# Patient Record
Sex: Male | Born: 1940 | Race: White | Hispanic: No | Marital: Married | Smoking: Never smoker
Health system: Southern US, Community
[De-identification: ages and names within clinical notes are randomized; demographics above are authoritative.]

## PROBLEM LIST (undated history)

## (undated) DIAGNOSIS — C801 Malignant (primary) neoplasm, unspecified: Secondary | ICD-10-CM

## (undated) DIAGNOSIS — I5032 Chronic diastolic (congestive) heart failure: Secondary | ICD-10-CM

## (undated) DIAGNOSIS — I2510 Atherosclerotic heart disease of native coronary artery without angina pectoris: Secondary | ICD-10-CM

## (undated) DIAGNOSIS — M1990 Unspecified osteoarthritis, unspecified site: Secondary | ICD-10-CM

## (undated) DIAGNOSIS — H9193 Unspecified hearing loss, bilateral: Secondary | ICD-10-CM

## (undated) DIAGNOSIS — I10 Essential (primary) hypertension: Secondary | ICD-10-CM

## (undated) DIAGNOSIS — F32A Depression, unspecified: Secondary | ICD-10-CM

## (undated) DIAGNOSIS — E785 Hyperlipidemia, unspecified: Secondary | ICD-10-CM

## (undated) DIAGNOSIS — C61 Malignant neoplasm of prostate: Secondary | ICD-10-CM

## (undated) HISTORY — DX: Atherosclerotic heart disease of native coronary artery without angina pectoris: I25.10

## (undated) HISTORY — PX: LYMPH NODE BIOPSY: SHX201

## (undated) HISTORY — PX: CORONARY ANGIOPLASTY: SHX604

## (undated) HISTORY — DX: Hyperlipidemia, unspecified: E78.5

## (undated) HISTORY — PX: ROBOT ASSISTED LAPAROSCOPIC RADICAL PROSTATECTOMY: SHX5141

## (undated) HISTORY — PX: NASAL SEPTUM SURGERY: SHX37

## (undated) HISTORY — DX: Essential (primary) hypertension: I10

## (undated) HISTORY — DX: Unspecified osteoarthritis, unspecified site: M19.90

---

## 2000-03-08 ENCOUNTER — Other Ambulatory Visit: Admission: RE | Admit: 2000-03-08 | Discharge: 2000-03-08 | Payer: Self-pay | Admitting: Orthopedic Surgery

## 2000-03-23 ENCOUNTER — Encounter: Admission: RE | Admit: 2000-03-23 | Discharge: 2000-03-23 | Payer: Self-pay | Admitting: Infectious Diseases

## 2000-04-02 ENCOUNTER — Encounter: Admission: RE | Admit: 2000-04-02 | Discharge: 2000-04-02 | Payer: Self-pay | Admitting: Infectious Diseases

## 2000-04-16 ENCOUNTER — Encounter: Admission: RE | Admit: 2000-04-16 | Discharge: 2000-04-16 | Payer: Self-pay | Admitting: Infectious Diseases

## 2000-05-14 ENCOUNTER — Encounter: Admission: RE | Admit: 2000-05-14 | Discharge: 2000-05-14 | Payer: Self-pay | Admitting: Infectious Diseases

## 2000-05-28 ENCOUNTER — Encounter: Admission: RE | Admit: 2000-05-28 | Discharge: 2000-05-28 | Payer: Self-pay | Admitting: Infectious Diseases

## 2000-09-24 ENCOUNTER — Encounter: Admission: RE | Admit: 2000-09-24 | Discharge: 2000-09-24 | Payer: Self-pay | Admitting: Infectious Diseases

## 2009-03-20 HISTORY — PX: NM MYOVIEW LTD: HXRAD82

## 2009-06-15 ENCOUNTER — Encounter: Admission: RE | Admit: 2009-06-15 | Discharge: 2009-06-15 | Payer: Self-pay | Admitting: Cardiovascular Disease

## 2009-06-17 ENCOUNTER — Inpatient Hospital Stay (HOSPITAL_COMMUNITY): Admission: RE | Admit: 2009-06-17 | Discharge: 2009-06-18 | Payer: Self-pay | Admitting: Cardiovascular Disease

## 2009-06-17 HISTORY — PX: CARDIAC CATHETERIZATION: SHX172

## 2009-09-27 ENCOUNTER — Inpatient Hospital Stay (HOSPITAL_COMMUNITY): Admission: RE | Admit: 2009-09-27 | Discharge: 2009-09-28 | Payer: Self-pay | Admitting: Urology

## 2009-09-27 ENCOUNTER — Encounter (INDEPENDENT_AMBULATORY_CARE_PROVIDER_SITE_OTHER): Payer: Self-pay | Admitting: Urology

## 2010-06-05 LAB — BASIC METABOLIC PANEL
CO2: 27 mEq/L (ref 19–32)
Calcium: 9.5 mg/dL (ref 8.4–10.5)
Creatinine, Ser: 0.89 mg/dL (ref 0.4–1.5)
GFR calc Af Amer: >60 mL/min (ref >60)
GFR calc non Af Amer: >60 mL/min (ref >60)
Glucose, Bld: 84 mg/dL (ref 70–99)
Potassium: 3.8 mEq/L (ref 3.5–5.1)

## 2010-06-05 LAB — CBC
HCT: 44.8 % (ref 39.0–52.0)
MCHC: 34.8 g/dL (ref 30.0–36.0)
RBC: 5.37 MIL/uL (ref 4.22–5.81)
RDW: 14.1 % (ref 11.5–15.5)

## 2010-06-05 LAB — TYPE AND SCREEN: ABO/RH(D): AB POS

## 2010-06-05 LAB — HEMOGLOBIN AND HEMATOCRIT, BLOOD
HCT: 41 % (ref 39.0–52.0)
Hemoglobin: 12.8 g/dL — ABNORMAL LOW (ref 13.0–17.0)
Hemoglobin: 14.3 g/dL (ref 13.0–17.0)

## 2010-06-08 LAB — BASIC METABOLIC PANEL
Calcium: 8.8 mg/dL (ref 8.4–10.5)
Chloride: 105 mEq/L (ref 96–112)
Creatinine, Ser: 0.86 mg/dL (ref 0.4–1.5)
GFR calc Af Amer: >60 mL/min (ref >60)
Glucose, Bld: 100 mg/dL — ABNORMAL HIGH (ref 70–99)
Potassium: 3.7 mEq/L (ref 3.5–5.1)
Sodium: 137 mEq/L (ref 135–145)

## 2010-06-08 LAB — CBC
MCHC: 33.9 g/dL (ref 30.0–36.0)
MCV: 84.3 fL (ref 78.0–100.0)
RBC: 4.82 MIL/uL (ref 4.22–5.81)
RDW: 13.2 % (ref 11.5–15.5)

## 2012-08-01 ENCOUNTER — Other Ambulatory Visit: Payer: Self-pay | Admitting: Cardiovascular Disease

## 2012-08-11 ENCOUNTER — Other Ambulatory Visit: Payer: Self-pay | Admitting: Cardiovascular Disease

## 2012-08-15 NOTE — Telephone Encounter (Signed)
Refills sent to pharmacy. 

## 2012-09-07 ENCOUNTER — Other Ambulatory Visit: Payer: Self-pay | Admitting: Cardiovascular Disease

## 2012-09-10 NOTE — Telephone Encounter (Signed)
Rx was sent to pharmacy electronically. 

## 2012-09-16 ENCOUNTER — Other Ambulatory Visit: Payer: Self-pay | Admitting: Cardiovascular Disease

## 2012-09-16 NOTE — Telephone Encounter (Signed)
Rx was sent to pharmacy electronically. 

## 2012-11-06 ENCOUNTER — Ambulatory Visit (INDEPENDENT_AMBULATORY_CARE_PROVIDER_SITE_OTHER): Payer: Medicare Other | Admitting: Cardiovascular Disease

## 2012-11-06 ENCOUNTER — Encounter: Payer: Self-pay | Admitting: Cardiovascular Disease

## 2012-11-06 VITALS — BP 140/90 | HR 48 | Ht 71.0 in | Wt 201.9 lb

## 2012-11-06 DIAGNOSIS — I2510 Atherosclerotic heart disease of native coronary artery without angina pectoris: Secondary | ICD-10-CM | POA: Insufficient documentation

## 2012-11-06 DIAGNOSIS — I10 Essential (primary) hypertension: Secondary | ICD-10-CM

## 2012-11-06 DIAGNOSIS — E785 Hyperlipidemia, unspecified: Secondary | ICD-10-CM

## 2012-11-06 NOTE — Assessment & Plan Note (Signed)
On statin therapy followed by his PCP 

## 2012-11-06 NOTE — Patient Instructions (Addendum)
Your physician recommends that you schedule a follow-up appointment in: 6 months with bryan  Your physician recommends that you schedule a follow-up appointment in: 12 months with Dr Allyson Sabal

## 2012-11-06 NOTE — Assessment & Plan Note (Signed)
Status post LAD stenting using bare-metal stent 04/16/96 by myself. He had normal LV function and normal renal arteries. I recatheterized him 06/17/09 after a negative Myoview stress test because of reproducible symptoms revealing a 90% stenosis in the LAD just beyond the previously stented segment which I can stented with a MultiLink vision bare metal stent. He's done well since and is asymptomatic.

## 2012-11-06 NOTE — Assessment & Plan Note (Signed)
Under adequate control on multiple antihypertensive medications with documentation of normal renal arteries

## 2012-11-06 NOTE — Progress Notes (Signed)
11/06/2012 Jeffrey Acevedo   01/14/1941  [id number]  Primary Physician Feliciana Rossetti, MD Primary Cardiologist: Runell Gess MD [name]   HPI:  The patient is a 72 year old mildly overweight married Caucasian male, father of 2 and grandfather of 4 grandchildren, whom I last saw 3 months ago. He has a history of CAD, status post LAD stenting by me using bare-metal stent April 16, 1996. He had normal LV function at that time with normal renal arteries as well. His other problems include essential hypertension, hyperlipidemia. He had a normal Myoview February 2011 and was scheduled to have elective robotic prostatectomy for prostate cancer by Dr. Crecencio Mc. He developed exertional chest pain similar to his pre-stent symptoms, which were fairly reproducible and because of this, and despite a negative Myoview, I elected to recatheterize him June 17, 2009, revealing a patent proximal LAD stent with 90% stenosis just beyond this, which I then stented with a Multi-Link Vision bare-metal stent with an excellent result. His most recent lipid profile is excellent. He saw [facility] back in the office 05/08/12 and was doing well. Since that time he denies chest pain or shortness of breath. He has had some "venous stasis changes in his right ankle and pretibial area.   Current Outpatient Prescriptions  Medication Sig Dispense Refill  . amLODipine (NORVASC) 10 MG tablet Take 0.5 tablets by mouth daily.       . Choline Fenofibrate (FENOFIBRIC ACID) 45 MG CPDR Take 1 tablet by mouth every other day. Alternates with Lovaza      . clobetasol ointment (TEMOVATE) 0.05 % Apply 1 application topically daily.      . clopidogrel (PLAVIX) 75 MG tablet Take 1 tablet (75 mg total) by mouth daily.  30 tablet  9  . lisinopril (PRINIVIL,ZESTRIL) 40 MG tablet TAKE ONE TABLET BY MOUTH EVERY DAY  30 tablet  6  . metoprolol (LOPRESSOR) 50 MG tablet Take 50 mg by mouth daily.      [name] omega-3 acid ethyl  esters (LOVAZA) 1 G capsule Take 1 g by mouth every other day. Alternates with Fenofibrate      . pravastatin (PRAVACHOL) 40 MG tablet Take 40 mg by mouth daily.      [name] spironolactone (ALDACTONE) 25 MG tablet TAKE ONE TABLET BY MOUTH EVERY DAY  30 tablet  8  . traMADol (ULTRAM) 50 MG tablet Take 50 mg by mouth as needed for pain.      . valsartan-hydrochlorothiazide (DIOVAN-HCT) 160-25 MG per tablet TAKE ONE TABLET BY MOUTH EVERY DAY  30 tablet  8   No current facility-administered medications for this visit.    Allergies  Allergen Reactions  . Vantin [Cefpodoxime] Rash    History   Social History  . Marital Status: Married    Spouse Name: N/A    Number of Children: N/A  . Years of Education: N/A   Occupational History  . Not on file.   Social History Main Topics  . Smoking status: Never Smoker   . Smokeless tobacco: Never Used  . Alcohol Use: No  . Drug Use: Not on file  . Sexual Activity: Not on file   Other Topics Concern  . Not on file   Social History Narrative  . No narrative on file     Review of Systems: General: negative for chills, fever, night sweats or weight changes.  Cardiovascular: negative for chest pain, dyspnea on exertion, edema, orthopnea, palpitations, paroxysmal nocturnal dyspnea or shortness of breath  Dermatological: negative for rash Respiratory: negative for cough or wheezing Urologic: negative for hematuria Abdominal: negative for nausea, vomiting, diarrhea, bright red blood per rectum, melena, or hematemesis Neurologic: negative for visual changes, syncope, or dizziness All other systems reviewed and are otherwise negative except as noted above.    Blood pressure 140/90, pulse 48, height 5\' 11"  (1.803 m), weight 201 lb 14.4 oz (91.581 kg).  General appearance: alert and no distress Neck: no adenopathy, no carotid bruit, no JVD, supple, symmetrical, trachea midline and thyroid not enlarged, symmetric, no tenderness/mass/nodules Lungs:  clear to auscultation bilaterally Heart: regular rate and rhythm, S1, S2 normal, no murmur, click, rub or gallop Extremities: extremities normal, atraumatic, no cyanosis or edema  EKG sinus bradycardia at 48 first-degree AV block  ASSESSMENT AND PLAN:   Coronary artery disease Status post LAD stenting using bare-metal stent 04/16/96 by myself. He had normal LV function and normal renal arteries. I recatheterized him 06/17/09 after a negative Myoview stress test because of reproducible symptoms revealing a 90% stenosis in the LAD just beyond the previously stented segment which I can stented with a MultiLink vision bare metal stent. He's done well since and is asymptomatic.  Essential hypertension Under adequate control on multiple antihypertensive medications with documentation of normal renal arteries  Hyperlipidemia On statin therapy followed by his PCP      Runell Gess MD [facility], [facility] 11/06/2012 10:08 AM

## 2012-11-09 ENCOUNTER — Other Ambulatory Visit: Payer: Self-pay | Admitting: Cardiovascular Disease

## 2012-11-18 ENCOUNTER — Other Ambulatory Visit: Payer: Self-pay | Admitting: Cardiovascular Disease

## 2012-11-19 NOTE — Telephone Encounter (Signed)
Rx was sent to pharmacy electronically. 

## 2012-11-21 ENCOUNTER — Other Ambulatory Visit: Payer: Self-pay | Admitting: *Deleted

## 2012-11-21 MED ORDER — PRAVASTATIN SODIUM 40 MG PO TABS: 40.0000 mg | ORAL_TABLET | Freq: Every day | ORAL | Status: DC

## 2012-11-21 NOTE — Telephone Encounter (Signed)
Rx was sent to pharmacy electronically. 

## 2013-04-04 ENCOUNTER — Other Ambulatory Visit: Payer: Self-pay | Admitting: Cardiovascular Disease

## 2013-04-07 ENCOUNTER — Other Ambulatory Visit: Payer: Self-pay | Admitting: *Deleted

## 2013-04-07 MED ORDER — LISINOPRIL 40 MG PO TABS: 40.0000 mg | ORAL_TABLET | Freq: Every day | ORAL | Status: DC

## 2013-05-08 ENCOUNTER — Encounter: Payer: Self-pay | Admitting: Physician Assistant

## 2013-05-08 ENCOUNTER — Ambulatory Visit (INDEPENDENT_AMBULATORY_CARE_PROVIDER_SITE_OTHER): Payer: Medicare Other | Admitting: Physician Assistant

## 2013-05-08 VITALS — BP 124/72 | HR 55 | Ht 72.0 in | Wt 202.0 lb

## 2013-05-08 DIAGNOSIS — I10 Essential (primary) hypertension: Secondary | ICD-10-CM

## 2013-05-08 DIAGNOSIS — E785 Hyperlipidemia, unspecified: Secondary | ICD-10-CM

## 2013-05-08 DIAGNOSIS — I2510 Atherosclerotic heart disease of native coronary artery without angina pectoris: Secondary | ICD-10-CM

## 2013-05-08 NOTE — Assessment & Plan Note (Signed)
Pressure is well-controlled at this time.

## 2013-05-08 NOTE — Patient Instructions (Signed)
1.  Follow up with Dr Gwenlyn Found in 6 months. 2.  Have a great weekend!

## 2013-05-08 NOTE — Assessment & Plan Note (Signed)
Most recent lipid panel, 03/05/2013: Total cholesterol 131, triglycerides 159, HDL 33 LDL 66 VLDL 32 9 HDL 98.  On pravastatin 40

## 2013-05-08 NOTE — Assessment & Plan Note (Signed)
No symptoms of angina. Good medical therapy.

## 2013-05-08 NOTE — Progress Notes (Signed)
Date:  05/08/2013   ID:  Jeffrey Acevedo, DOB [birth year], MRN [id number]  PCP:  Feliciana Rossetti, MD  Primary Cardiologist:   Allyson Sabal   History of Present Illness: Jeffrey Acevedo is a 73 y.o. mildly overweight married Caucasian male, father of 2 and grandfather of 4 grandchildren, whom I last saw 3 months ago. He has a history of CAD, status post LAD stenting by me using bare-metal stent April 16, 1996. He had normal LV function at that time with normal renal arteries as well. His other problems include essential hypertension, hyperlipidemia. He had a normal Myoview February 2011 and was scheduled to have elective robotic prostatectomy for prostate cancer by Dr. Crecencio Mc. He developed exertional chest pain similar to his pre-stent symptoms, which were fairly reproducible and because of this, and despite a negative Myoview, I elected to recatheterize him June 17, 2009, revealing a patent proximal LAD stent with 90% stenosis just beyond this, which I then stented with a Multi-Link Vision bare-metal stent with an excellent result. His most recent lipid profile is excellent. He saw Huey Bienenstock [facility] back in the office 05/08/12 and was doing well. Since that time he denies chest pain or shortness of breath. He has had some "venous stasis changes in his right ankle and pretibial area.  Patient presents today for 6 month evaluation. He's been doing for a while with the exception of having a viral illness which she is recovered from and continued issues with his right hip. 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 However he does have to rest occasionally due to hip pain. At some point he may have a hip replacement.  Most recent lipid panel, 03/05/2013: Total cholesterol 131, triglycerides 159, HDL 33 LDL 66 VLDL 32 9 HDL 98  The patient currently denies nausea, vomiting, fever, chest pain, shortness of breath, orthopnea, dizziness, PND, cough, congestion, abdominal  pain, hematochezia, melena, lower extremity edema, claudication.  Wt Readings from Last 3 Encounters:  05/08/13 202 lb (91.627 kg)  11/06/12 201 lb 14.4 oz (91.581 kg)     Past Medical History  Diagnosis Date  . Hypertension   . Coronary artery disease   . Hyperlipidemia   . Degenerative joint disease     Right hip    Current Outpatient Prescriptions  Medication Sig Dispense Refill  . amLODipine (NORVASC) 10 MG tablet TAKE ONE-HALF TABLET BY MOUTH EVERY DAY  15 tablet  11  . Choline Fenofibrate (FENOFIBRIC ACID) 45 MG CPDR TAKE ONE CAPSULE BY MOUTH EVERY DAY  30 capsule  6  . clobetasol ointment (TEMOVATE) 0.05 % Apply 1 application topically daily.      . clopidogrel (PLAVIX) 75 MG tablet Take 1 tablet (75 mg total) by mouth daily.  30 tablet  9  . lisinopril (PRINIVIL,ZESTRIL) 40 MG tablet Take 1 tablet (40 mg total) by mouth daily.  30 tablet  6  . metoprolol tartrate (LOPRESSOR) 25 MG tablet TAKE ONE TABLET BY MOUTH EVERY DAY  90 tablet  3  . omega-3 acid ethyl esters (LOVAZA) 1 G capsule Take 1 g by mouth every other day. Alternates with Fenofibrate      . pravastatin (PRAVACHOL) 40 MG tablet Take 1 tablet (40 mg total) by mouth daily.  90 tablet  3  . spironolactone (ALDACTONE) 25 MG tablet TAKE ONE TABLET BY MOUTH EVERY DAY  30 tablet  8  . valsartan-hydrochlorothiazide (DIOVAN-HCT) 160-25 MG per tablet  TAKE ONE TABLET BY MOUTH EVERY DAY  30 tablet  8  . traMADol (ULTRAM) 50 MG tablet Take 50 mg by mouth as needed for pain.       No current facility-administered medications for this visit.    Allergies:    Allergies  Allergen Reactions  . Vantin [Cefpodoxime] Rash    Social History:  The patient  reports that he has never smoked. He has never used smokeless tobacco. He reports that he does not drink alcohol.   Family history:  No family history on file.  ROS:  Please see the history of present illness.  All other systems reviewed and negative.   PHYSICAL EXAM: VS:   BP 124/72  Pulse 55  Ht 6' (1.829 m)  Wt 202 lb (91.627 kg)  BMI 27.39 kg/m2 Well nourished, well developed, in no acute distress HEENT: Pupils are equal round react to light accommodation extraocular movements are intact.  Neck: no JVDNo cervical lymphadenopathy. Cardiac: Regular rate and rhythm without murmurs rubs or gallops. Lungs:  clear to auscultation bilaterally, no wheezing, rhonchi or rales Abd: soft, nontender, positive bowel sounds all quadrants, no hepatosplenomegaly Ext: no lower extremity edema.  2+ radial and dorsalis pedis pulses. Skin: warm and dry Neuro:  Grossly normal  EKG:  Sinus bradycardia with first-degree AV block at a rate of 55 beats per minute   ASSESSMENT AND PLAN:  Problem List Items Addressed This Visit   Essential hypertension - Primary     Pressure is well-controlled at this time.    Hyperlipidemia     Most recent lipid panel, 03/05/2013: Total cholesterol 131, triglycerides 159, HDL 33 LDL 66 VLDL 32 9 HDL 98.  On pravastatin 40    Coronary artery disease     No symptoms of angina. Good medical therapy.

## 2013-05-16 ENCOUNTER — Encounter: Payer: Self-pay | Admitting: Cardiovascular Disease

## 2013-07-15 ENCOUNTER — Other Ambulatory Visit: Payer: Self-pay | Admitting: *Deleted

## 2013-07-15 MED ORDER — SPIRONOLACTONE 25 MG PO TABS: 25.0000 mg | ORAL_TABLET | Freq: Every day | ORAL | Status: DC

## 2013-07-22 ENCOUNTER — Other Ambulatory Visit: Payer: Self-pay

## 2013-07-22 MED ORDER — CLOPIDOGREL BISULFATE 75 MG PO TABS: 75.0000 mg | ORAL_TABLET | Freq: Every day | ORAL | Status: DC

## 2013-07-22 MED ORDER — VALSARTAN-HYDROCHLOROTHIAZIDE 160-25 MG PO TABS: 1.0000 | ORAL_TABLET | Freq: Every day | ORAL | Status: DC

## 2013-07-22 NOTE — Telephone Encounter (Signed)
Rx was sent to pharmacy electronically. 

## 2013-11-17 ENCOUNTER — Other Ambulatory Visit: Payer: Self-pay | Admitting: Cardiovascular Disease

## 2013-11-17 NOTE — Telephone Encounter (Signed)
Rx was sent to pharmacy electronically. 

## 2013-11-24 ENCOUNTER — Other Ambulatory Visit: Payer: Self-pay | Admitting: Cardiovascular Disease

## 2013-11-25 NOTE — Telephone Encounter (Signed)
Rx was sent to pharmacy electronically. 

## 2013-11-27 ENCOUNTER — Other Ambulatory Visit: Payer: Self-pay | Admitting: Cardiovascular Disease

## 2013-11-28 NOTE — Telephone Encounter (Signed)
Rx refill sent to patient pharmacy   

## 2013-12-04 ENCOUNTER — Encounter: Payer: Self-pay | Admitting: Cardiovascular Disease

## 2013-12-04 ENCOUNTER — Ambulatory Visit (INDEPENDENT_AMBULATORY_CARE_PROVIDER_SITE_OTHER): Payer: Medicare Other | Admitting: Cardiovascular Disease

## 2013-12-04 VITALS — BP 166/98 | HR 62 | Ht 72.0 in | Wt 204.0 lb

## 2013-12-04 DIAGNOSIS — I2510 Atherosclerotic heart disease of native coronary artery without angina pectoris: Secondary | ICD-10-CM

## 2013-12-04 DIAGNOSIS — E785 Hyperlipidemia, unspecified: Secondary | ICD-10-CM

## 2013-12-04 DIAGNOSIS — I10 Essential (primary) hypertension: Secondary | ICD-10-CM

## 2013-12-04 NOTE — Progress Notes (Signed)
12/04/2013 Jeffrey Acevedo   January 23, 1941  [id number]  Primary Physician Jeffrey Rossetti, MD Primary Cardiologist: Jeffrey Gess MD Jeffrey Acevedo   HPI:  The patient is a 73 year old mildly overweight married Caucasian male, father of 2 and grandfather of 4 grandchildren, whom I last saw 12 months ago. He has a history of CAD, status post LAD stenting by me using bare-metal stent April 16, 1996. He had normal LV function at that time with normal renal arteries as well. His other problems include essential hypertension, hyperlipidemia. He had a normal Myoview February 2011 and was scheduled to have elective robotic prostatectomy for prostate cancer by Dr. Crecencio Acevedo. He developed exertional chest pain similar to his pre-stent symptoms, which were fairly reproducible and because of this, and despite a negative Myoview, I elected to recatheterize him June 17, 2009, revealing a patent proximal LAD stent with 90% stenosis just beyond this, which I then stented with a Multi-Link Vision bare-metal stent with an excellent result. Since I saw him a year ago he's been completely asymptomatic.  Current Outpatient Prescriptions  Medication Sig Dispense Refill  . amLODipine (NORVASC) 10 MG tablet TAKE ONE-HALF TABLET BY MOUTH ONCE DAILY  15 tablet  6  . Choline Fenofibrate (FENOFIBRIC ACID) 45 MG CPDR TAKE ONE CAPSULE BY MOUTH EVERY DAY  30 capsule  6  . clobetasol ointment (TEMOVATE) 0.05 % Apply 1 application topically daily.      . clopidogrel (PLAVIX) 75 MG tablet Take 1 tablet (75 mg total) by mouth daily.  30 tablet  9  . lisinopril (PRINIVIL,ZESTRIL) 40 MG tablet TAKE ONE TABLET BY MOUTH ONCE DAILY  30 tablet  5  . metoprolol tartrate (LOPRESSOR) 25 MG tablet TAKE ONE TABLET BY MOUTH ONCE DAILY  90 tablet  1  . omega-3 acid ethyl esters (LOVAZA) 1 G capsule Take 1 g by mouth every other day. Alternates with Fenofibrate      . pravastatin (PRAVACHOL) 40 MG tablet TAKE ONE TABLET BY MOUTH  ONCE DAILY  90 tablet  1  . spironolactone (ALDACTONE) 25 MG tablet Take 1 tablet (25 mg total) by mouth daily.  30 tablet  8  . traMADol (ULTRAM) 50 MG tablet Take 50 mg by mouth as needed for pain.      . valsartan-hydrochlorothiazide (DIOVAN-HCT) 160-25 MG per tablet Take 1 tablet by mouth daily.  30 tablet  9   No current facility-administered medications for this visit.    Allergies  Allergen Reactions  . Vantin [Cefpodoxime] Rash    History   Social History  . Marital Status: Married    Spouse Name: N/A    Number of Children: N/A  . Years of Education: N/A   Occupational History  . Not on file.   Social History Main Topics  . Smoking status: Never Smoker   . Smokeless tobacco: Never Used  . Alcohol Use: No  . Drug Use: Not on file  . Sexual Activity: Not on file   Other Topics Concern  . Not on file   Social History Narrative  . No narrative on file     Review of Systems: General: negative for chills, fever, night sweats or weight changes.  Cardiovascular: negative for chest pain, dyspnea on exertion, edema, orthopnea, palpitations, paroxysmal nocturnal dyspnea or shortness of breath Dermatological: negative for rash Respiratory: negative for cough or wheezing Urologic: negative for hematuria Abdominal: negative for nausea, vomiting, diarrhea, bright red blood per rectum, melena, or hematemesis Neurologic: negative  for visual changes, syncope, or dizziness All other systems reviewed and are otherwise negative except as noted above.    Blood pressure 166/98, pulse 62, height 6' (1.829 m), weight 204 lb (92.534 kg).  General appearance: alert and no distress Neck: no adenopathy, no carotid bruit, no JVD, supple, symmetrical, trachea midline and thyroid not enlarged, symmetric, no tenderness/mass/nodules Lungs: clear to auscultation bilaterally Heart: regular rate and rhythm, S1, S2 normal, no murmur, click, rub or gallop Extremities: extremities normal,  atraumatic, no cyanosis or edema  EKG normal sinus rhythm at 62 with no ST or T wave changes  ASSESSMENT AND PLAN:   Essential hypertension His blood pressure is elevated today but on review of his home blood pressure checks it appears to be well controlled on current medications  Hyperlipidemia On statin therapy followed by his PCP  Coronary artery disease History of CAD status post LAD stenting by myself using a femoral stent 04/16/96. Because of her current symptoms I recatheterized him 06/17/09 revealing a patent proximal LAD stent with 90% stenosis just beyond this which I stented using a MultiLink vision bare metal stent with an excellent result. He denies breath.      Jeffrey Gess MD FACP,FACC,FAHA, [facility] 12/04/2013 4:49 PM

## 2013-12-04 NOTE — Assessment & Plan Note (Signed)
His blood pressure is elevated today but on review of his home blood pressure checks it appears to be well controlled on current medications

## 2013-12-04 NOTE — Assessment & Plan Note (Signed)
On statin therapy followed by his PCP 

## 2013-12-04 NOTE — Assessment & Plan Note (Signed)
History of CAD status post LAD stenting by myself using a femoral stent 04/16/96. Because of her current symptoms I recatheterized him 06/17/09 revealing a patent proximal LAD stent with 90% stenosis just beyond this which I stented using a MultiLink vision bare metal stent with an excellent result. He denies breath.

## 2013-12-04 NOTE — Patient Instructions (Signed)
Your physician wants you to follow-up in: 1 year with Dr Berry. You will receive a reminder letter in the mail two months in advance. If you don't receive a letter, please call our office to schedule the follow-up appointment.  

## 2014-04-03 ENCOUNTER — Other Ambulatory Visit: Payer: Self-pay

## 2014-04-03 MED ORDER — LISINOPRIL 40 MG PO TABS: 40.0000 mg | ORAL_TABLET | Freq: Every day | ORAL | Status: DC

## 2014-04-03 MED ORDER — VALSARTAN-HYDROCHLOROTHIAZIDE 160-25 MG PO TABS
1.0000 | ORAL_TABLET | Freq: Every day | ORAL | Status: DC
Start: 2014-04-03 — End: 2015-04-09

## 2014-04-03 NOTE — Telephone Encounter (Signed)
Rx sent to pharmacy   

## 2014-05-05 ENCOUNTER — Other Ambulatory Visit: Payer: Self-pay | Admitting: Cardiovascular Disease

## 2014-05-05 NOTE — Telephone Encounter (Signed)
Rx(s) sent to pharmacy electronically.  

## 2014-06-10 ENCOUNTER — Other Ambulatory Visit: Payer: Self-pay | Admitting: Cardiovascular Disease

## 2014-06-10 NOTE — Telephone Encounter (Signed)
E -sent prescription sent

## 2014-06-17 ENCOUNTER — Encounter: Payer: Self-pay | Admitting: *Deleted

## 2014-06-24 ENCOUNTER — Other Ambulatory Visit: Payer: Self-pay | Admitting: Cardiovascular Disease

## 2014-06-24 NOTE — Telephone Encounter (Signed)
Rx(s) sent to pharmacy electronically.  

## 2014-06-30 ENCOUNTER — Other Ambulatory Visit: Payer: Self-pay | Admitting: Cardiovascular Disease

## 2014-07-01 NOTE — Telephone Encounter (Signed)
Rx(s) sent to pharmacy electronically.  

## 2014-07-20 ENCOUNTER — Other Ambulatory Visit: Payer: Self-pay | Admitting: Cardiovascular Disease

## 2014-07-20 NOTE — Telephone Encounter (Signed)
Rx refill sent to patient pharmacy   

## 2014-08-03 ENCOUNTER — Other Ambulatory Visit: Payer: Self-pay | Admitting: Cardiovascular Disease

## 2014-08-03 NOTE — Telephone Encounter (Signed)
Rx has been sent to the pharmacy electronically. ° °

## 2014-10-13 ENCOUNTER — Encounter: Payer: Self-pay | Admitting: Cardiovascular Disease

## 2014-10-16 ENCOUNTER — Encounter: Payer: Self-pay | Admitting: *Deleted

## 2014-10-30 ENCOUNTER — Other Ambulatory Visit: Payer: Self-pay | Admitting: Cardiovascular Disease

## 2014-10-30 NOTE — Telephone Encounter (Signed)
Rx(s) sent to pharmacy electronically. OV 12/09/14

## 2014-11-20 ENCOUNTER — Other Ambulatory Visit: Payer: Self-pay | Admitting: Cardiovascular Disease

## 2014-11-20 NOTE — Telephone Encounter (Signed)
Rx has been sent to the pharmacy electronically. ° °

## 2014-12-09 ENCOUNTER — Ambulatory Visit (INDEPENDENT_AMBULATORY_CARE_PROVIDER_SITE_OTHER): Payer: Medicare Other | Admitting: Cardiovascular Disease

## 2014-12-09 ENCOUNTER — Encounter: Payer: Self-pay | Admitting: Cardiovascular Disease

## 2014-12-09 VITALS — BP 138/86 | HR 61 | Ht 72.0 in | Wt 210.0 lb

## 2014-12-09 DIAGNOSIS — E785 Hyperlipidemia, unspecified: Secondary | ICD-10-CM

## 2014-12-09 DIAGNOSIS — I2510 Atherosclerotic heart disease of native coronary artery without angina pectoris: Secondary | ICD-10-CM | POA: Diagnosis not present

## 2014-12-09 DIAGNOSIS — I10 Essential (primary) hypertension: Secondary | ICD-10-CM | POA: Diagnosis not present

## 2014-12-09 DIAGNOSIS — I2583 Coronary atherosclerosis due to lipid rich plaque: Secondary | ICD-10-CM

## 2014-12-09 NOTE — Patient Instructions (Signed)
Medication Instructions:  Your physician recommends that you continue on your current medications as directed. Please refer to the Current Medication list given to you today.  Labwork: none  Testing/Procedures: none  Follow-Up: Your physician wants you to follow-up in: 12 months with Dr. Berry. You will receive a reminder letter in the mail two months in advance. If you don't receive a letter, please call our office to schedule the follow-up appointment.    Any Other Special Instructions Will Be Listed Below (If Applicable).   

## 2014-12-09 NOTE — Assessment & Plan Note (Signed)
History of hyperlipidemia on pravastatin 40 mg a day followed by his PCP 

## 2014-12-09 NOTE — Assessment & Plan Note (Signed)
History of coronary artery disease status post LAD stenting by myself using a bare-metal stent 04/16/1996. He had normal LV function at that time with normal renal arteries as well. Because of symptoms of chest pain similar to his pre-stent symptoms despite a negative Myoview I elected to recath him 06/17/09. Patent proximal LAD stent the 90% stenosis just beyond this which I stented using a MultiLink vision bare medical stent with an excellent result. He denies chest pain or shortness of breath.

## 2014-12-09 NOTE — Progress Notes (Signed)
12/09/2014 Jeffrey Acevedo   [birth year]  [id number]  Primary Physician Feliciana Rossetti, MD Primary Cardiologist: Runell Gess MD [name]   HPI:  The patient is a 74 year old mildly overweight married Caucasian male, father of 2 and grandfather of 4 grandchildren, whom I last saw 12 months ago. He has a history of CAD, status post LAD stenting by me using bare-metal stent April 16, 1996. He had normal LV function at that time with normal renal arteries as well. His other problems include essential hypertension, hyperlipidemia. He had a normal Myoview February 2011 and was scheduled to have elective robotic prostatectomy for prostate cancer by Dr. Crecencio Mc. He developed exertional chest pain similar to his pre-stent symptoms, which were fairly reproducible and because of this, and despite a negative Myoview, I elected to recatheterize him June 17, 2009, revealing a patent proximal LAD stent with 90% stenosis just beyond this, which I then stented with a Multi-Link Vision bare-metal stent with an excellent result. Since I saw him a year ago he's been completely asymptomatic.   Current Outpatient Prescriptions  Medication Sig Dispense Refill  . amLODipine (NORVASC) 10 MG tablet TAKE ONE-HALF TABLET BY MOUTH ONCE DAILY 15 tablet 6  . Choline Fenofibrate (FENOFIBRIC ACID) 45 MG CPDR TAKE ONE CAPSULE BY MOUTH ONCE DAILY 30 capsule 4  . Choline Fenofibrate (FENOFIBRIC ACID) 45 MG CPDR Take 1 capsule by mouth every other day.    . clobetasol ointment (TEMOVATE) 0.05 % Apply 1 application topically daily.    . clopidogrel (PLAVIX) 75 MG tablet TAKE ONE TABLET BY MOUTH ONCE DAILY 30 tablet 1  . lisinopril (PRINIVIL,ZESTRIL) 40 MG tablet Take 1 tablet (40 mg total) by mouth daily. 90 tablet 1  . metoprolol tartrate (LOPRESSOR) 25 MG tablet Take 1 tablet (25 mg total) by mouth daily. 90 tablet 1  . Omega-3 Fatty Acids (FISH OIL) 1000 MG CAPS Take 1 capsule by mouth every other day.     . pravastatin (PRAVACHOL) 40 MG tablet TAKE ONE TABLET BY MOUTH ONCE DAILY 90 tablet 0  . spironolactone (ALDACTONE) 25 MG tablet TAKE ONE TABLET BY MOUTH ONCE DAILY 30 tablet 6  . valsartan-hydrochlorothiazide (DIOVAN-HCT) 160-25 MG per tablet Take 1 tablet by mouth daily. 30 tablet 5   No current facility-administered medications for this visit.    Allergies  Allergen Reactions  . Vantin [Cefpodoxime] Rash    Social History   Social History  . Marital Status: Married    Spouse Name: N/A  . Number of Children: N/A  . Years of Education: N/A   Occupational History  . Not on file.   Social History Main Topics  . Smoking status: Never Smoker   . Smokeless tobacco: Never Used  . Alcohol Use: No  . Drug Use: Not on file  . Sexual Activity: Not on file   Other Topics Concern  . Not on file   Social History Narrative     Review of Systems: General: negative for chills, fever, night sweats or weight changes.  Cardiovascular: negative for chest pain, dyspnea on exertion, edema, orthopnea, palpitations, paroxysmal nocturnal dyspnea or shortness of breath Dermatological: negative for rash Respiratory: negative for cough or wheezing Urologic: negative for hematuria Abdominal: negative for nausea, vomiting, diarrhea, bright red blood per rectum, melena, or hematemesis Neurologic: negative for visual changes, syncope, or dizziness All other systems reviewed and are otherwise negative except as noted above.    Blood pressure 138/86, pulse 61, height 6' (1.829  m), weight 210 lb (95.255 kg), SpO2 97 %.  General appearance: alert and no distress Neck: no adenopathy, no carotid bruit, no JVD, supple, symmetrical, trachea midline and thyroid not enlarged, symmetric, no tenderness/mass/nodules Lungs: clear to auscultation bilaterally Heart: regular rate and rhythm, S1, S2 normal, no murmur, click, rub or gallop Extremities: extremities normal, atraumatic, no cyanosis or  edema Pulses: 2+ and symmetric Skin: Skin color, texture, turgor normal. No rashes or lesions Neurologic: Grossly normal  EKG sinus bradycardia at 50 with a ST or T-wave changes. I personally reviewed this EKG  ASSESSMENT AND PLAN:   Hyperlipidemia History of hyperlipidemia on pravastatin 40 mg a day followed by his PCP  Essential hypertension History of hypertension blood pressure measured at 138/86. He is on amlodipine, lisinopril and metoprolol as well as Diovan and hydrochlorothiazide. Continue current meds at current dosing  Coronary artery disease History of coronary artery disease status post LAD stenting by myself using a bare-metal stent 04/16/1996. He had normal LV function at that time with normal renal arteries as well. Because of symptoms of chest pain similar to his pre-stent symptoms despite a negative Myoview I elected to recath him 06/17/09. Patent proximal LAD stent the 90% stenosis just beyond this which I stented using a MultiLink vision bare medical stent with an excellent result. He denies chest pain or shortness of breath.      Runell Gess MD FACP,FACC,FAHA, [facility] 12/09/2014 11:57 AM

## 2014-12-09 NOTE — Assessment & Plan Note (Signed)
History of hypertension blood pressure measured at 138/86. He is on amlodipine, lisinopril and metoprolol as well as Diovan and hydrochlorothiazide. Continue current meds at current dosing

## 2014-12-27 ENCOUNTER — Other Ambulatory Visit: Payer: Self-pay | Admitting: Cardiovascular Disease

## 2014-12-28 ENCOUNTER — Other Ambulatory Visit: Payer: Self-pay | Admitting: *Deleted

## 2014-12-28 NOTE — Telephone Encounter (Signed)
REFILL 

## 2015-01-26 ENCOUNTER — Other Ambulatory Visit: Payer: Self-pay | Admitting: Cardiovascular Disease

## 2015-01-30 ENCOUNTER — Other Ambulatory Visit: Payer: Self-pay | Admitting: Cardiovascular Disease

## 2015-02-12 ENCOUNTER — Other Ambulatory Visit: Payer: Self-pay | Admitting: Cardiovascular Disease

## 2015-02-15 NOTE — Telephone Encounter (Signed)
REFILL 

## 2015-03-11 ENCOUNTER — Telehealth: Payer: Self-pay | Admitting: Cardiovascular Disease

## 2015-03-11 NOTE — Telephone Encounter (Signed)
No note needed 

## 2015-03-12 ENCOUNTER — Other Ambulatory Visit: Payer: Self-pay | Admitting: Cardiovascular Disease

## 2015-03-12 NOTE — Telephone Encounter (Signed)
Rx request sent to pharmacy.  

## 2015-03-16 ENCOUNTER — Telehealth: Payer: Self-pay

## 2015-03-16 DIAGNOSIS — Z01818 Encounter for other preprocedural examination: Secondary | ICD-10-CM

## 2015-03-16 NOTE — Telephone Encounter (Signed)
Requesting surgical clearance:   1. Type of surgery: Right total hip replacment  2. Surgeon: Dr. Berenice Primas, [place]  3. Surgical date: 04/19/2015  4. Medications that need to be held: Plavix  5. CAD: Yes     6. I will defer to: Lauris Poag Fax: [phone] Phone: [phone]

## 2015-03-17 NOTE — Telephone Encounter (Signed)
Pt notified that scheduling will be in contact with him to setup stress test. Pt expressed understanding, no questions at this time.

## 2015-03-17 NOTE — Telephone Encounter (Signed)
Needs Lexiscan for clearance prior to [facility]

## 2015-03-18 NOTE — Telephone Encounter (Signed)
Pt scheduled for Lexiscan on 1/11

## 2015-03-26 ENCOUNTER — Telehealth (HOSPITAL_COMMUNITY): Payer: Self-pay

## 2015-03-26 NOTE — Telephone Encounter (Signed)
Encounter complete. 

## 2015-03-30 ENCOUNTER — Telehealth (HOSPITAL_COMMUNITY): Payer: Self-pay

## 2015-03-30 NOTE — Telephone Encounter (Signed)
Encounter complete. 

## 2015-03-31 ENCOUNTER — Ambulatory Visit (HOSPITAL_COMMUNITY)
Admission: RE | Admit: 2015-03-31 | Discharge: 2015-03-31 | Disposition: A | Discharge status: Home / Self Care | Payer: Medicare Other | Source: Ambulatory Visit | Attending: Cardiovascular Disease | Admitting: Cardiovascular Disease

## 2015-03-31 DIAGNOSIS — I10 Essential (primary) hypertension: Secondary | ICD-10-CM | POA: Insufficient documentation

## 2015-03-31 DIAGNOSIS — Z8249 Family history of ischemic heart disease and other diseases of the circulatory system: Secondary | ICD-10-CM | POA: Diagnosis not present

## 2015-03-31 DIAGNOSIS — I2510 Atherosclerotic heart disease of native coronary artery without angina pectoris: Secondary | ICD-10-CM

## 2015-03-31 DIAGNOSIS — Z01818 Encounter for other preprocedural examination: Secondary | ICD-10-CM

## 2015-03-31 LAB — MYOCARDIAL PERFUSION IMAGING
CHL CUP NUCLEAR SRS: 3
CHL CUP RESTING HR STRESS: 47 {beats}/min
CSEPPHR: 64 {beats}/min
LV sys vol: 56 mL
LVDIAVOL: 128 mL
SDS: 0
SSS: 3
TID: 1.2

## 2015-03-31 MED ORDER — REGADENOSON 0.4 MG/5ML IV SOLN
0.4000 mg | Freq: Once | INTRAVENOUS | Status: AC
  Administered 2015-03-31: 0.4 mg via INTRAVENOUS

## 2015-03-31 MED ORDER — TECHNETIUM TC 99M SESTAMIBI GENERIC - CARDIOLITE
29.6000 | Freq: Once | INTRAVENOUS | Status: AC | PRN
  Administered 2015-03-31: 29.6 via INTRAVENOUS

## 2015-03-31 MED ORDER — TECHNETIUM TC 99M SESTAMIBI GENERIC - CARDIOLITE
10.4000 | Freq: Once | INTRAVENOUS | Status: AC | PRN
  Administered 2015-03-31: 10.4 via INTRAVENOUS

## 2015-04-02 NOTE — Telephone Encounter (Signed)
Cleared for THR at low risk. Neg MV. OK to interrupt DAPT

## 2015-04-02 NOTE — Telephone Encounter (Signed)
Jeffrey Acevedo is calling back to see when the surgical clearance form will be completed, because the pt's surgery is scheduled for 1/30. Please f/u with her  Thanks

## 2015-04-02 NOTE — Telephone Encounter (Signed)
Pt had normal low risk myoview, okay to proceed with procedure? What adjustments need to made, if any, to his Plavix before procedure?

## 2015-04-05 ENCOUNTER — Encounter: Payer: Self-pay | Admitting: *Deleted

## 2015-04-05 ENCOUNTER — Other Ambulatory Visit: Payer: Self-pay | Admitting: Orthopedic Surgery

## 2015-04-05 NOTE — Telephone Encounter (Signed)
Clearance letter prepared, Elmyra Ricks notified. Needs Dr. Kennon Holter stamp or signature.

## 2015-04-05 NOTE — Telephone Encounter (Signed)
Jeffrey Acevedo says she needs clearance asap.She can not do anything else until pt is cleared. Pt is scheduled for surgery on 04-19-15.

## 2015-04-06 NOTE — Telephone Encounter (Signed)
Clearance letters faxed to Nicole's attention.

## 2015-04-09 ENCOUNTER — Other Ambulatory Visit: Payer: Self-pay | Admitting: Cardiovascular Disease

## 2015-04-09 ENCOUNTER — Telehealth: Payer: Self-pay | Admitting: Cardiovascular Disease

## 2015-04-09 NOTE — Telephone Encounter (Signed)
Rx(s) sent to pharmacy electronically.  

## 2015-04-09 NOTE — Telephone Encounter (Signed)
Pt advised stress test normal, he voiced understanding.

## 2015-04-09 NOTE — Telephone Encounter (Signed)
Returning call from yesterday.he does not know who called.

## 2015-04-13 NOTE — Pre-Procedure Instructions (Signed)
Jeffrey Acevedo  04/13/2015      [facility] [address],  - [place] [id number] [place] [id number] Phone: [phone] Fax: [phone]  [place], [place], [place] [address] Phone: [phone] Fax: [phone]    Your procedure is scheduled on January 30th, Monday   Report to [facility] Admitting at 1:15 Pm             (Surgery time 3:15 pm to 5:45 pm)   Call this number if you have problems the morning of surgery:  [phone]   Remember:  Do not eat food or drink liquids after midnight Sunday.  Take these medicines the morning of surgery with A SIP OF WATER : Norvasc, Lopressor             STOP taking 4-5 days prior to surgery, any anti-inflammatories, vitamins, herbal supplements)   Do not wear jewelry - no rings or watches.  Do not wear lotions or colognes.    You may NOT wear deodorant the day of surgery.             Men may shave face and neck.   Do not bring valuables to the hospital.  [facility] is not responsible for any belongings or valuables.  Contacts, dentures or bridgework may not be worn into surgery.  Leave your suitcase in the car.  After surgery it may be brought to your room. For patients admitted to the hospital, discharge time will be determined by your treatment team.   Name and phone number of your driver:      Please read over the following fact sheets that you were given. Pain Booklet, Coughing and Deep Breathing, Blood Transfusion Information, MRSA Information and Surgical Site Infection Prevention

## 2015-04-13 NOTE — Progress Notes (Addendum)
Cardio is Dr. Adora Fridge.   Just had Myoview 03/31/2015.  Heart cath 1998 with stents, another cath 2011. Having to get in touch with Trish, cardmaster, to ask about his Plavix & Aspirin-whether to hold or not.  [phone]

## 2015-04-14 ENCOUNTER — Encounter (HOSPITAL_COMMUNITY): Payer: Self-pay

## 2015-04-14 ENCOUNTER — Ambulatory Visit (HOSPITAL_COMMUNITY)
Admission: RE | Admit: 2015-04-14 | Discharge: 2015-04-14 | Disposition: A | Discharge status: Home / Self Care | Payer: Medicare Other | Source: Ambulatory Visit | Attending: Orthopedic Surgery | Admitting: Orthopedic Surgery

## 2015-04-14 ENCOUNTER — Encounter (HOSPITAL_COMMUNITY)
Admission: RE | Admit: 2015-04-14 | Discharge: 2015-04-14 | Disposition: A | Discharge status: Home / Self Care | Payer: Medicare Other | Source: Ambulatory Visit | Attending: Orthopedic Surgery | Admitting: Orthopedic Surgery

## 2015-04-14 DIAGNOSIS — M1611 Unilateral primary osteoarthritis, right hip: Secondary | ICD-10-CM | POA: Insufficient documentation

## 2015-04-14 DIAGNOSIS — Z01818 Encounter for other preprocedural examination: Secondary | ICD-10-CM | POA: Diagnosis not present

## 2015-04-14 DIAGNOSIS — Z01812 Encounter for preprocedural laboratory examination: Secondary | ICD-10-CM | POA: Insufficient documentation

## 2015-04-14 DIAGNOSIS — Z0183 Encounter for blood typing: Secondary | ICD-10-CM | POA: Insufficient documentation

## 2015-04-14 HISTORY — DX: Malignant (primary) neoplasm, unspecified: C80.1

## 2015-04-14 HISTORY — DX: Unspecified hearing loss, bilateral: H91.93

## 2015-04-14 LAB — TYPE AND SCREEN
ABO/RH(D): AB POS
ANTIBODY SCREEN: NEGATIVE

## 2015-04-14 LAB — URINALYSIS, ROUTINE W REFLEX MICROSCOPIC
BILIRUBIN URINE: NEGATIVE
Glucose, UA: NEGATIVE mg/dL
Ketones, ur: NEGATIVE mg/dL
Leukocytes, UA: NEGATIVE
NITRITE: NEGATIVE
PROTEIN: NEGATIVE mg/dL
SPECIFIC GRAVITY, URINE: 1.014 (ref 1.005–1.030)
pH: 5.5 (ref 5.0–8.0)

## 2015-04-14 LAB — COMPREHENSIVE METABOLIC PANEL
ALBUMIN: 4.2 g/dL (ref 3.5–5.0)
ALK PHOS: 59 U/L (ref 38–126)
ALT: 21 U/L (ref 17–63)
AST: 22 U/L (ref 15–41)
Anion gap: 6 (ref 5–15)
BILIRUBIN TOTAL: 0.8 mg/dL (ref 0.3–1.2)
BUN: 18 mg/dL (ref 6–20)
CALCIUM: 9.8 mg/dL (ref 8.9–10.3)
CO2: 26 mmol/L (ref 22–32)
Chloride: 105 mmol/L (ref 101–111)
Creatinine, Ser: 1.06 mg/dL (ref 0.61–1.24)
GFR calc Af Amer: >60 mL/min (ref >60)
GFR calc non Af Amer: >60 mL/min (ref >60)
GLUCOSE: 95 mg/dL (ref 65–99)
POTASSIUM: 4.2 mmol/L (ref 3.5–5.1)
Sodium: 137 mmol/L (ref 135–145)
TOTAL PROTEIN: 7.2 g/dL (ref 6.5–8.1)

## 2015-04-14 LAB — CBC WITH DIFFERENTIAL/PLATELET
BASOS ABS: 0.1 10*3/uL (ref 0.0–0.1)
BASOS PCT: 1 %
Eosinophils Absolute: 0.4 10*3/uL (ref 0.0–0.7)
Eosinophils Relative: 4 %
HEMATOCRIT: 46.1 % (ref 39.0–52.0)
HEMOGLOBIN: 15.6 g/dL (ref 13.0–17.0)
Lymphocytes Relative: 18 %
Lymphs Abs: 1.6 10*3/uL (ref 0.7–4.0)
MCH: 27.7 pg (ref 26.0–34.0)
MCHC: 33.8 g/dL (ref 30.0–36.0)
MCV: 81.7 fL (ref 78.0–100.0)
Monocytes Absolute: 1 10*3/uL (ref 0.1–1.0)
Monocytes Relative: 11 %
NEUTROS ABS: 6.3 10*3/uL (ref 1.7–7.7)
NEUTROS PCT: 66 %
Platelets: 270 10*3/uL (ref 150–400)
RBC: 5.64 MIL/uL (ref 4.22–5.81)
RDW: 13.9 % (ref 11.5–15.5)
WBC: 9.3 10*3/uL (ref 4.0–10.5)

## 2015-04-14 LAB — PROTIME-INR
INR: 1.09 (ref 0.00–1.49)
Prothrombin Time: 14.3 seconds (ref 11.6–15.2)

## 2015-04-14 LAB — URINE MICROSCOPIC-ADD ON

## 2015-04-14 LAB — SURGICAL PCR SCREEN
MRSA, PCR: NEGATIVE
STAPHYLOCOCCUS AUREUS: NEGATIVE

## 2015-04-14 LAB — ABO/RH: ABO/RH(D): AB POS

## 2015-04-14 LAB — APTT: APTT: 31 s (ref 24–37)

## 2015-04-14 NOTE — Progress Notes (Signed)
   04/14/15 1110  OBSTRUCTIVE SLEEP APNEA  Have you ever been diagnosed with sleep apnea through a sleep study? No  Do you snore loudly (loud enough to be heard through closed doors)?  0  Do you often feel tired, fatigued, or sleepy during the daytime (such as falling asleep during driving or talking to someone)? 0 (will depend on the activity level he has for that day)  Has anyone observed you stop breathing during your sleep? 0  Do you have, or are you being treated for high blood pressure? 1  BMI more than 35 kg/m2? 0  Age > 50 (1-yes) 1  Neck circumference greater than:Male 16 inches or larger, Male 17inches or larger? 1  Male Gender (Yes=1) 1  Obstructive Sleep Apnea Score 4  Score 5 or greater  Results sent to PCP

## 2015-04-16 MED ORDER — CLINDAMYCIN PHOSPHATE 900 MG/50ML IV SOLN
900.0000 mg | INTRAVENOUS | Status: AC
  Administered 2015-04-19: 900 mg via INTRAVENOUS
  Filled 2015-04-16: qty 50

## 2015-04-16 MED ORDER — CHLORHEXIDINE GLUCONATE 4 % EX LIQD: 60.0000 mL | Freq: Once | CUTANEOUS | Status: DC

## 2015-04-19 ENCOUNTER — Encounter (HOSPITAL_COMMUNITY): Payer: Self-pay | Admitting: *Deleted

## 2015-04-19 ENCOUNTER — Inpatient Hospital Stay (HOSPITAL_COMMUNITY): Payer: Medicare Other

## 2015-04-19 ENCOUNTER — Encounter (HOSPITAL_COMMUNITY)
Admission: RE | Disposition: A | Discharge status: Home / Self Care | Payer: Self-pay | Source: Ambulatory Visit | Attending: Orthopedic Surgery

## 2015-04-19 ENCOUNTER — Inpatient Hospital Stay (HOSPITAL_COMMUNITY): Payer: Medicare Other | Admitting: Certified Registered Nurse Anesthetist

## 2015-04-19 ENCOUNTER — Inpatient Hospital Stay (HOSPITAL_COMMUNITY)
Admission: RE | Admit: 2015-04-19 | Discharge: 2015-04-21 | DRG: 470 | Disposition: A | Discharge status: Home / Self Care | Payer: Medicare Other | Source: Ambulatory Visit | Attending: Orthopedic Surgery | Admitting: Orthopedic Surgery

## 2015-04-19 DIAGNOSIS — M1611 Unilateral primary osteoarthritis, right hip: Secondary | ICD-10-CM | POA: Diagnosis not present

## 2015-04-19 DIAGNOSIS — Z79899 Other long term (current) drug therapy: Secondary | ICD-10-CM

## 2015-04-19 DIAGNOSIS — I10 Essential (primary) hypertension: Secondary | ICD-10-CM | POA: Diagnosis present

## 2015-04-19 DIAGNOSIS — H9193 Unspecified hearing loss, bilateral: Secondary | ICD-10-CM | POA: Diagnosis present

## 2015-04-19 DIAGNOSIS — E785 Hyperlipidemia, unspecified: Secondary | ICD-10-CM | POA: Diagnosis present

## 2015-04-19 DIAGNOSIS — Z8546 Personal history of malignant neoplasm of prostate: Secondary | ICD-10-CM

## 2015-04-19 DIAGNOSIS — Z419 Encounter for procedure for purposes other than remedying health state, unspecified: Secondary | ICD-10-CM

## 2015-04-19 DIAGNOSIS — Z955 Presence of coronary angioplasty implant and graft: Secondary | ICD-10-CM | POA: Diagnosis not present

## 2015-04-19 DIAGNOSIS — I2510 Atherosclerotic heart disease of native coronary artery without angina pectoris: Secondary | ICD-10-CM | POA: Diagnosis present

## 2015-04-19 DIAGNOSIS — M25551 Pain in right hip: Secondary | ICD-10-CM | POA: Diagnosis present

## 2015-04-19 HISTORY — PX: TOTAL HIP ARTHROPLASTY: SHX124

## 2015-04-19 SURGERY — ARTHROPLASTY, HIP, TOTAL, ANTERIOR APPROACH
Anesthesia: Monitor Anesthesia Care | Site: Hip | Laterality: Right

## 2015-04-19 MED ORDER — OXYCODONE-ACETAMINOPHEN 5-325 MG PO TABS
1.0000 | ORAL_TABLET | ORAL | Status: DC | PRN
Start: 2015-04-19 — End: 2015-12-14

## 2015-04-19 MED ORDER — FENOFIBRIC ACID 45 MG PO CPDR
1.0000 | DELAYED_RELEASE_CAPSULE | ORAL | Status: DC
  Filled 2015-04-19: qty 1

## 2015-04-19 MED ORDER — PROMETHAZINE HCL 25 MG/ML IJ SOLN: 6.2500 mg | INTRAMUSCULAR | Status: DC | PRN

## 2015-04-19 MED ORDER — METOPROLOL TARTRATE 25 MG PO TABS
25.0000 mg | ORAL_TABLET | Freq: Every day | ORAL | Status: DC
  Administered 2015-04-20 – 2015-04-21 (×2): 25 mg via ORAL
  Filled 2015-04-19 (×2): qty 1

## 2015-04-19 MED ORDER — PROPOFOL 10 MG/ML IV BOLUS
INTRAVENOUS | Status: AC
  Filled 2015-04-19: qty 20

## 2015-04-19 MED ORDER — EPHEDRINE SULFATE 50 MG/ML IJ SOLN
INTRAMUSCULAR | Status: AC
  Filled 2015-04-19: qty 2

## 2015-04-19 MED ORDER — BUPIVACAINE HCL (PF) 0.5 % IJ SOLN
INTRAMUSCULAR | Status: AC
  Filled 2015-04-19: qty 30

## 2015-04-19 MED ORDER — ACETAMINOPHEN 650 MG RE SUPP: 650.0000 mg | Freq: Four times a day (QID) | RECTAL | Status: DC | PRN

## 2015-04-19 MED ORDER — LIDOCAINE HCL (CARDIAC) 20 MG/ML IV SOLN
INTRAVENOUS | Status: AC
  Filled 2015-04-19: qty 5

## 2015-04-19 MED ORDER — FENTANYL CITRATE (PF) 250 MCG/5ML IJ SOLN
INTRAMUSCULAR | Status: AC
  Filled 2015-04-19: qty 5

## 2015-04-19 MED ORDER — GLYCOPYRROLATE 0.2 MG/ML IJ SOLN
INTRAMUSCULAR | Status: AC
  Filled 2015-04-19: qty 1

## 2015-04-19 MED ORDER — PROPOFOL 10 MG/ML IV BOLUS
INTRAVENOUS | Status: DC | PRN
  Administered 2015-04-19: 20 mg via INTRAVENOUS

## 2015-04-19 MED ORDER — METHOCARBAMOL 1000 MG/10ML IJ SOLN
500.0000 mg | Freq: Four times a day (QID) | INTRAVENOUS | Status: DC | PRN
  Filled 2015-04-19: qty 5

## 2015-04-19 MED ORDER — ONDANSETRON HCL 4 MG PO TABS: 4.0000 mg | ORAL_TABLET | Freq: Four times a day (QID) | ORAL | Status: DC | PRN

## 2015-04-19 MED ORDER — LACTATED RINGERS IV SOLN
INTRAVENOUS | Status: DC
  Administered 2015-04-19 (×2): via INTRAVENOUS

## 2015-04-19 MED ORDER — HYDROMORPHONE HCL 1 MG/ML IJ SOLN: 0.5000 mg | INTRAMUSCULAR | Status: DC | PRN

## 2015-04-19 MED ORDER — CLINDAMYCIN PHOSPHATE 600 MG/50ML IV SOLN
600.0000 mg | Freq: Four times a day (QID) | INTRAVENOUS | Status: AC
Start: 2015-04-19 — End: 2015-04-20
  Administered 2015-04-19 – 2015-04-20 (×2): 600 mg via INTRAVENOUS
  Filled 2015-04-19 (×2): qty 50

## 2015-04-19 MED ORDER — BUPIVACAINE HCL 0.5 % IJ SOLN
INTRAMUSCULAR | Status: DC | PRN
  Administered 2015-04-19: 20 mL

## 2015-04-19 MED ORDER — LISINOPRIL 40 MG PO TABS
40.0000 mg | ORAL_TABLET | Freq: Every day | ORAL | Status: DC
  Administered 2015-04-20: 40 mg via ORAL
  Filled 2015-04-19 (×2): qty 1

## 2015-04-19 MED ORDER — EPHEDRINE SULFATE 50 MG/ML IJ SOLN
INTRAMUSCULAR | Status: DC | PRN
  Administered 2015-04-19: 15 mg via INTRAVENOUS
  Administered 2015-04-19: 10 mg via INTRAVENOUS

## 2015-04-19 MED ORDER — FENTANYL CITRATE (PF) 100 MCG/2ML IJ SOLN
25.0000 ug | INTRAMUSCULAR | Status: DC | PRN
  Administered 2015-04-19: 50 ug via INTRAVENOUS
  Administered 2015-04-19 (×3): 25 ug via INTRAVENOUS

## 2015-04-19 MED ORDER — BUPIVACAINE IN DEXTROSE 0.75-8.25 % IT SOLN
INTRATHECAL | Status: DC | PRN
  Administered 2015-04-19: 15 mg via INTRATHECAL

## 2015-04-19 MED ORDER — 0.9 % SODIUM CHLORIDE (POUR BTL) OPTIME
TOPICAL | Status: DC | PRN
  Administered 2015-04-19: 1000 mL

## 2015-04-19 MED ORDER — ACETAMINOPHEN 325 MG PO TABS
650.0000 mg | ORAL_TABLET | Freq: Four times a day (QID) | ORAL | Status: DC | PRN
  Administered 2015-04-20: 650 mg via ORAL
  Filled 2015-04-19: qty 2

## 2015-04-19 MED ORDER — FLEET ENEMA 7-19 GM/118ML RE ENEM: 1.0000 | ENEMA | Freq: Once | RECTAL | Status: DC | PRN

## 2015-04-19 MED ORDER — BUPIVACAINE LIPOSOME 1.3 % IJ SUSP
20.0000 mL | INTRAMUSCULAR | Status: AC
  Administered 2015-04-19: 20 mL
  Filled 2015-04-19: qty 20

## 2015-04-19 MED ORDER — POLYETHYLENE GLYCOL 3350 17 G PO PACK: 17.0000 g | PACK | Freq: Every day | ORAL | Status: DC | PRN

## 2015-04-19 MED ORDER — ASPIRIN EC 325 MG PO TBEC
325.0000 mg | DELAYED_RELEASE_TABLET | Freq: Two times a day (BID) | ORAL | Status: DC
  Administered 2015-04-20 – 2015-04-21 (×3): 325 mg via ORAL
  Filled 2015-04-19 (×3): qty 1

## 2015-04-19 MED ORDER — HYDROCHLOROTHIAZIDE 25 MG PO TABS
25.0000 mg | ORAL_TABLET | Freq: Every day | ORAL | Status: DC
  Administered 2015-04-21: 25 mg via ORAL
  Filled 2015-04-19 (×2): qty 1

## 2015-04-19 MED ORDER — FENTANYL CITRATE (PF) 100 MCG/2ML IJ SOLN
INTRAMUSCULAR | Status: DC | PRN
  Administered 2015-04-19: 50 ug via INTRAVENOUS

## 2015-04-19 MED ORDER — PHENYLEPHRINE HCL 10 MG/ML IJ SOLN
INTRAMUSCULAR | Status: AC
  Filled 2015-04-19: qty 1

## 2015-04-19 MED ORDER — SPIRONOLACTONE 25 MG PO TABS
25.0000 mg | ORAL_TABLET | Freq: Every day | ORAL | Status: DC
  Administered 2015-04-20 – 2015-04-21 (×2): 25 mg via ORAL
  Filled 2015-04-19 (×5): qty 1

## 2015-04-19 MED ORDER — PHENYLEPHRINE HCL 10 MG/ML IJ SOLN
10.0000 mg | INTRAVENOUS | Status: DC | PRN
  Administered 2015-04-19: 40 ug/min via INTRAVENOUS

## 2015-04-19 MED ORDER — ARTIFICIAL TEARS OP OINT
TOPICAL_OINTMENT | OPHTHALMIC | Status: AC
  Filled 2015-04-19: qty 3.5

## 2015-04-19 MED ORDER — CLOPIDOGREL BISULFATE 75 MG PO TABS
75.0000 mg | ORAL_TABLET | Freq: Every day | ORAL | Status: DC
  Administered 2015-04-20 – 2015-04-21 (×2): 75 mg via ORAL
  Filled 2015-04-19 (×2): qty 1

## 2015-04-19 MED ORDER — DIPHENHYDRAMINE HCL 12.5 MG/5ML PO ELIX: 12.5000 mg | ORAL_SOLUTION | ORAL | Status: DC | PRN

## 2015-04-19 MED ORDER — MIDAZOLAM HCL 2 MG/2ML IJ SOLN
INTRAMUSCULAR | Status: AC
  Filled 2015-04-19: qty 2

## 2015-04-19 MED ORDER — MEPERIDINE HCL 25 MG/ML IJ SOLN
6.2500 mg | INTRAMUSCULAR | Status: DC | PRN
Start: 2015-04-19 — End: 2015-04-19

## 2015-04-19 MED ORDER — TIZANIDINE HCL 2 MG PO TABS: 2.0000 mg | ORAL_TABLET | Freq: Three times a day (TID) | ORAL | Status: DC | PRN

## 2015-04-19 MED ORDER — ALUM & MAG HYDROXIDE-SIMETH 200-200-20 MG/5ML PO SUSP: 30.0000 mL | ORAL | Status: DC | PRN

## 2015-04-19 MED ORDER — PRAVASTATIN SODIUM 40 MG PO TABS
40.0000 mg | ORAL_TABLET | Freq: Every day | ORAL | Status: DC
  Administered 2015-04-20: 40 mg via ORAL
  Filled 2015-04-19 (×2): qty 1

## 2015-04-19 MED ORDER — OXYCODONE HCL 5 MG PO TABS
ORAL_TABLET | ORAL | Status: AC
  Filled 2015-04-19: qty 2

## 2015-04-19 MED ORDER — FENTANYL CITRATE (PF) 100 MCG/2ML IJ SOLN
INTRAMUSCULAR | Status: AC
  Filled 2015-04-19: qty 2

## 2015-04-19 MED ORDER — ONDANSETRON HCL 4 MG/2ML IJ SOLN: 4.0000 mg | Freq: Four times a day (QID) | INTRAMUSCULAR | Status: DC | PRN

## 2015-04-19 MED ORDER — METHOCARBAMOL 500 MG PO TABS
ORAL_TABLET | ORAL | Status: AC
  Filled 2015-04-19: qty 1

## 2015-04-19 MED ORDER — ROCURONIUM BROMIDE 50 MG/5ML IV SOLN
INTRAVENOUS | Status: AC
  Filled 2015-04-19: qty 1

## 2015-04-19 MED ORDER — PROPOFOL 500 MG/50ML IV EMUL
INTRAVENOUS | Status: DC | PRN
  Administered 2015-04-19: 75 ug/kg/min via INTRAVENOUS

## 2015-04-19 MED ORDER — ONDANSETRON HCL 4 MG/2ML IJ SOLN
INTRAMUSCULAR | Status: AC
  Filled 2015-04-19: qty 2

## 2015-04-19 MED ORDER — PROMETHAZINE HCL 25 MG/ML IJ SOLN: 12.5000 mg | Freq: Four times a day (QID) | INTRAMUSCULAR | Status: DC | PRN

## 2015-04-19 MED ORDER — ZOLPIDEM TARTRATE 5 MG PO TABS
5.0000 mg | ORAL_TABLET | Freq: Every evening | ORAL | Status: DC | PRN
  Administered 2015-04-20: 5 mg via ORAL
  Filled 2015-04-19: qty 1

## 2015-04-19 MED ORDER — AMLODIPINE BESYLATE 5 MG PO TABS
5.0000 mg | ORAL_TABLET | Freq: Every day | ORAL | Status: DC
  Administered 2015-04-20: 5 mg via ORAL
  Filled 2015-04-19 (×2): qty 1

## 2015-04-19 MED ORDER — IRBESARTAN 150 MG PO TABS
150.0000 mg | ORAL_TABLET | Freq: Every day | ORAL | Status: DC
  Filled 2015-04-19 (×2): qty 1

## 2015-04-19 MED ORDER — METHOCARBAMOL 500 MG PO TABS
500.0000 mg | ORAL_TABLET | Freq: Four times a day (QID) | ORAL | Status: DC | PRN
  Administered 2015-04-19 – 2015-04-20 (×3): 500 mg via ORAL
  Filled 2015-04-19 (×2): qty 1

## 2015-04-19 MED ORDER — TRANEXAMIC ACID 1000 MG/10ML IV SOLN
1000.0000 mg | INTRAVENOUS | Status: AC
  Administered 2015-04-19: 1000 mg via INTRAVENOUS
  Filled 2015-04-19: qty 10

## 2015-04-19 MED ORDER — MIDAZOLAM HCL 5 MG/5ML IJ SOLN
INTRAMUSCULAR | Status: DC | PRN
Start: 2015-04-19 — End: 2015-04-19
  Administered 2015-04-19: 2 mg via INTRAVENOUS

## 2015-04-19 MED ORDER — DOCUSATE SODIUM 100 MG PO CAPS
100.0000 mg | ORAL_CAPSULE | Freq: Two times a day (BID) | ORAL | Status: DC
  Administered 2015-04-19 – 2015-04-20 (×3): 100 mg via ORAL
  Filled 2015-04-19 (×4): qty 1

## 2015-04-19 MED ORDER — DEXAMETHASONE SODIUM PHOSPHATE 10 MG/ML IJ SOLN
10.0000 mg | Freq: Two times a day (BID) | INTRAMUSCULAR | Status: AC
  Administered 2015-04-19 – 2015-04-20 (×2): 10 mg via INTRAVENOUS
  Filled 2015-04-19 (×2): qty 1

## 2015-04-19 MED ORDER — VALSARTAN-HYDROCHLOROTHIAZIDE 160-25 MG PO TABS: 1.0000 | ORAL_TABLET | Freq: Every day | ORAL | Status: DC

## 2015-04-19 MED ORDER — OXYCODONE HCL 5 MG PO TABS
5.0000 mg | ORAL_TABLET | ORAL | Status: DC | PRN
  Administered 2015-04-19 (×2): 10 mg via ORAL
  Administered 2015-04-20: 5 mg via ORAL
  Administered 2015-04-21 (×2): 10 mg via ORAL
  Filled 2015-04-19 (×3): qty 2
  Filled 2015-04-19: qty 1

## 2015-04-19 MED ORDER — SODIUM CHLORIDE 0.9 % IV SOLN
INTRAVENOUS | Status: DC
  Administered 2015-04-19: 18:00:00 via INTRAVENOUS
  Administered 2015-04-20: 100 mL/h via INTRAVENOUS

## 2015-04-19 MED ORDER — BISACODYL 5 MG PO TBEC
5.0000 mg | DELAYED_RELEASE_TABLET | Freq: Every day | ORAL | Status: DC | PRN
  Administered 2015-04-20: 5 mg via ORAL
  Filled 2015-04-19: qty 1

## 2015-04-19 SURGICAL SUPPLY — 62 items
APL SKNCLS STERI-STRIP NONHPOA (GAUZE/BANDAGES/DRESSINGS) ×1
BENZOIN TINCTURE PRP APPL 2/3 (GAUZE/BANDAGES/DRESSINGS) ×3 IMPLANT
BLADE SAW SGTL 18X1.27X75 (BLADE) ×2 IMPLANT
BLADE SAW SGTL 18X1.27X75MM (BLADE) ×1
BLADE SURG ROTATE 9660 (MISCELLANEOUS) IMPLANT
BNDG COHESIVE 6X5 TAN STRL LF (GAUZE/BANDAGES/DRESSINGS) IMPLANT
BNDG GAUZE ELAST 4 BULKY (GAUZE/BANDAGES/DRESSINGS) IMPLANT
CAPT HIP TOTAL 2 ×2 IMPLANT
CELLS DAT CNTRL 66122 CELL SVR (MISCELLANEOUS) ×1 IMPLANT
CLOSURE STERI-STRIP 1/2X4 (GAUZE/BANDAGES/DRESSINGS) ×1
CLSR STERI-STRIP ANTIMIC 1/2X4 (GAUZE/BANDAGES/DRESSINGS) ×1 IMPLANT
COVER PERINEAL POST (MISCELLANEOUS) ×3 IMPLANT
COVER SURGICAL LIGHT HANDLE (MISCELLANEOUS) ×3 IMPLANT
DRAPE C-ARM 42X72 X-RAY (DRAPES) ×3 IMPLANT
DRAPE STERI IOBAN 125X83 (DRAPES) ×3 IMPLANT
DRAPE U-SHAPE 47X51 STRL (DRAPES) ×6 IMPLANT
DRSG AQUACEL AG ADV 3.5X10 (GAUZE/BANDAGES/DRESSINGS) ×3 IMPLANT
DURAPREP 26ML APPLICATOR (WOUND CARE) ×3 IMPLANT
ELECT BLADE 4.0 EZ CLEAN MEGAD (MISCELLANEOUS)
ELECT CAUTERY BLADE 6.4 (BLADE) ×3 IMPLANT
ELECT REM PT RETURN 9FT ADLT (ELECTROSURGICAL) ×3
ELECTRODE BLDE 4.0 EZ CLN MEGD (MISCELLANEOUS) IMPLANT
ELECTRODE REM PT RTRN 9FT ADLT (ELECTROSURGICAL) ×1 IMPLANT
GAUZE XEROFORM 1X8 LF (GAUZE/BANDAGES/DRESSINGS) ×3 IMPLANT
GLOVE BIOGEL PI IND STRL 8 (GLOVE) ×2 IMPLANT
GLOVE BIOGEL PI INDICATOR 8 (GLOVE) ×4
GLOVE ECLIPSE 7.5 STRL STRAW (GLOVE) ×6 IMPLANT
GOWN STRL REUS W/ TWL LRG LVL3 (GOWN DISPOSABLE) ×2 IMPLANT
GOWN STRL REUS W/ TWL XL LVL3 (GOWN DISPOSABLE) ×2 IMPLANT
GOWN STRL REUS W/TWL LRG LVL3 (GOWN DISPOSABLE) ×6
GOWN STRL REUS W/TWL XL LVL3 (GOWN DISPOSABLE) ×6
HOOD PEEL AWAY FACE SHEILD DIS (HOOD) ×6 IMPLANT
KIT BASIN OR (CUSTOM PROCEDURE TRAY) ×3 IMPLANT
KIT ROOM TURNOVER OR (KITS) ×3 IMPLANT
MANIFOLD NEPTUNE II (INSTRUMENTS) ×3 IMPLANT
NDL SPNL 22GX3.5 QUINCKE BK (NEEDLE) ×1 IMPLANT
NEEDLE SPNL 22GX3.5 QUINCKE BK (NEEDLE) ×3 IMPLANT
NS IRRIG 1000ML POUR BTL (IV SOLUTION) ×3 IMPLANT
PACK TOTAL JOINT (CUSTOM PROCEDURE TRAY) ×3 IMPLANT
PACK UNIVERSAL I (CUSTOM PROCEDURE TRAY) ×3 IMPLANT
PAD ARMBOARD 7.5X6 YLW CONV (MISCELLANEOUS) ×6 IMPLANT
RETRACTOR WND ALEXIS 18 MED (MISCELLANEOUS) IMPLANT
RTRCTR WOUND ALEXIS 18CM MED (MISCELLANEOUS) ×3
RTRCTR WOUND ALEXIS 18CM SML (INSTRUMENTS)
SAVER CELL AAL HAEMONETICS (INSTRUMENTS) ×1 IMPLANT
SPONGE LAP 18X18 X RAY DECT (DISPOSABLE) IMPLANT
STAPLER VISISTAT 35W (STAPLE) IMPLANT
SUT ETHIBOND NAB CT1 #1 30IN (SUTURE) ×6 IMPLANT
SUT MNCRL AB 3-0 PS2 18 (SUTURE) ×2 IMPLANT
SUT VIC AB 0 CT1 27 (SUTURE) ×6
SUT VIC AB 0 CT1 27XBRD ANBCTR (SUTURE) ×1 IMPLANT
SUT VIC AB 1 CT1 27 (SUTURE) ×6
SUT VIC AB 1 CT1 27XBRD ANBCTR (SUTURE) ×2 IMPLANT
SUT VIC AB 2-0 CT1 27 (SUTURE) ×3
SUT VIC AB 2-0 CT1 TAPERPNT 27 (SUTURE) ×1 IMPLANT
SYR 50ML LL SCALE MARK (SYRINGE) ×3 IMPLANT
TOWEL OR 17X24 6PK STRL BLUE (TOWEL DISPOSABLE) ×3 IMPLANT
TOWEL OR 17X26 10 PK STRL BLUE (TOWEL DISPOSABLE) ×3 IMPLANT
TRAY CATH 16FR W/PLASTIC CATH (SET/KITS/TRAYS/PACK) IMPLANT
TRAY FOLEY CATH 14FRSI W/METER (CATHETERS) ×2 IMPLANT
TRAY FOLEY CATH 16FR SILVER (SET/KITS/TRAYS/PACK) IMPLANT
WATER STERILE IRR 1000ML POUR (IV SOLUTION) ×2 IMPLANT

## 2015-04-19 NOTE — Anesthesia Procedure Notes (Signed)
Spinal Patient location during procedure: OR Start time: 04/19/2015 2:07 PM End time: 04/19/2015 2:17 PM Staffing Anesthesiologist: Duane Boston Performed by: anesthesiologist  Preanesthetic Checklist Completed: patient identified, surgical consent, pre-op evaluation, timeout performed, IV checked, risks and benefits discussed and monitors and equipment checked Spinal Block Patient position: sitting Prep: DuraPrep Patient monitoring: cardiac monitor, continuous pulse ox and blood pressure Approach: midline Location: L3-4 Injection technique: single-shot Needle Needle type: Pencan  Needle gauge: 24 G Needle length: 9 cm Additional Notes Functioning IV was confirmed and monitors were applied. Sterile prep and drape, including hand hygiene and sterile gloves were used. The patient was positioned and the spine was prepped. The skin was anesthetized with lidocaine.  Free flow of clear CSF was obtained prior to injecting local anesthetic into the CSF.  The spinal needle aspirated freely following injection.  The needle was carefully withdrawn.  The patient tolerated the procedure well.

## 2015-04-19 NOTE — H&P (Addendum)
TOTAL HIP ADMISSION H&P  Patient is admitted for right total hip arthroplasty.  Subjective:  Chief Complaint: right hip pain  HPI: Jeffrey Acevedo, 75 y.o. male, has a history of pain and functional disability in the right hip(s) due to arthritis and patient has failed non-surgical conservative treatments for greater than 12 weeks to include NSAID's and/or analgesics, corticosteriod injections, weight reduction as appropriate and activity modification.  Onset of symptoms was gradual starting 3 years ago with gradually worsening course since that time.The patient noted no past surgery on the right hip(s).  Patient currently rates pain in the right hip at 10 out of 10 with activity. Patient has night pain, worsening of pain with activity and weight bearing, trendelenberg gait, pain that interfers with activities of daily living, pain with passive range of motion, crepitus and joint swelling. Patient has evidence of subchondral sclerosis, periarticular osteophytes and joint space narrowing by imaging studies. This condition presents safety issues increasing the risk of falls. This patient has had ffailure of all reasonable conservative care.  There is no current active infection.  Patient Active Problem List   Diagnosis Date Noted  . Essential hypertension 11/06/2012  . Hyperlipidemia 11/06/2012  . Coronary artery disease 11/06/2012   Past Medical History  Diagnosis Date  . Hypertension   . Hyperlipidemia   . Degenerative joint disease     Right hip  . Coronary artery disease     sees Dr. Adora Fridge 3 mths ago.  Stress test 03/31/2015 in epic  . Cancer [facility])     prostate cancer surgery 2011  . Hearing loss of both ears     only at present wears the right ear hearing aid    Past Surgical History  Procedure Laterality Date  . Cardiac catheterization  06/17/2009    Proximal LAD 90% stenosis just beyond previously stented segment with a Multi-Link Vision bare-metal stent  . Nm myoview ltd  2011   . Coronary angioplasty      stents placed in 1998 and 2011   . Nasal septum surgery      Dr. Hassell Done @ [facility]  2014  . Robot assisted laparoscopic radical prostatectomy      No prescriptions prior to admission   Allergies  Allergen Reactions  . Adhesive [Tape] Other (See Comments)    Mainly just redness.   Has been ruled out for latex allegery  . Vantin [Cefpodoxime] Rash    Social History  Substance Use Topics  . Smoking status: Never Smoker   . Smokeless tobacco: Never Used  . Alcohol Use: No    Family History  Problem Relation Age of Onset  . Hypertension Mother   . Heart disease Father   . Hypertension Father      ROS ROS: I have reviewed the patient's review of systems thoroughly and there are no positive responses as relates to the HPI. Objective:  Physical Exam  Vital signs in last 24 hours:    Filed Vitals:   04/19/15 1249  BP: 146/73  Pulse: 57  Temp: 97.9 F (36.6 C)  Resp: 18   Well-developed well-nourished patient in no acute distress. Alert and oriented x3 HEENT:within normal limits Cardiac: Regular rate and rhythm Pulmonary: Lungs clear to auscultation Abdomen: Soft and nontender.  Normal active bowel sounds  Musculoskeletal: (right hip: Painful range of motion.  Limited range of motion.  Extremity limited internal rotation.  Neurovascularly intact distally. Labs: Recent Results (from the past 2160 hour(s))  Myocardial Perfusion Imaging     Status: None   Collection Time: 03/31/15 11:45 AM  Result Value Ref Range   Rest HR 47 bpm   Rest BP 165/103 mmHg   Exercise duration (min)  min   Exercise duration (sec)  sec   Estimated workload  METS   Peak HR 64 bpm   Peak BP 157/97 mmHg   MPHR  bpm   Percent HR  %   RPE     LV Systolic Volume 56 mL   TID 7.68    LV Diastolic Volume 115 mL   LHR     SSS 3    SRS 3    SDS 0   Urinalysis, Routine w reflex microscopic (not at [facility])     Status: Abnormal   Collection Time:  04/14/15 11:51 AM  Result Value Ref Range   Color, Urine YELLOW YELLOW   APPearance CLEAR CLEAR   Specific Gravity, Urine 1.014 1.005 - 1.030   pH 5.5 5.0 - 8.0   Glucose, UA NEGATIVE NEGATIVE mg/dL   Hgb urine dipstick TRACE (A) NEGATIVE   Bilirubin Urine NEGATIVE NEGATIVE   Ketones, ur NEGATIVE NEGATIVE mg/dL   Protein, ur NEGATIVE NEGATIVE mg/dL   Nitrite NEGATIVE NEGATIVE   Leukocytes, UA NEGATIVE NEGATIVE  Urine microscopic-add on     Status: Abnormal   Collection Time: 04/14/15 11:51 AM  Result Value Ref Range   Squamous Epithelial / LPF 0-5 (A) NONE SEEN   WBC, UA 0-5 0 - 5 WBC/hpf   RBC / HPF 0-5 0 - 5 RBC/hpf   Bacteria, UA RARE (A) NONE SEEN  APTT     Status: None   Collection Time: 04/14/15 11:52 AM  Result Value Ref Range   aPTT 31 24 - 37 seconds  CBC WITH DIFFERENTIAL     Status: None   Collection Time: 04/14/15 11:52 AM  Result Value Ref Range   WBC 9.3 4.0 - 10.5 K/uL   RBC 5.64 4.22 - 5.81 MIL/uL   Hemoglobin 15.6 13.0 - 17.0 g/dL   HCT 46.1 39.0 - 52.0 %   MCV 81.7 78.0 - 100.0 fL   MCH 27.7 26.0 - 34.0 pg   MCHC 33.8 30.0 - 36.0 g/dL   RDW 13.9 11.5 - 15.5 %   Platelets 270 150 - 400 K/uL   Neutrophils Relative % 66 %   Neutro Abs 6.3 1.7 - 7.7 K/uL   Lymphocytes Relative 18 %   Lymphs Abs 1.6 0.7 - 4.0 K/uL   Monocytes Relative 11 %   Monocytes Absolute 1.0 0.1 - 1.0 K/uL   Eosinophils Relative 4 %   Eosinophils Absolute 0.4 0.0 - 0.7 K/uL   Basophils Relative 1 %   Basophils Absolute 0.1 0.0 - 0.1 K/uL  Comprehensive metabolic panel     Status: None   Collection Time: 04/14/15 11:52 AM  Result Value Ref Range   Sodium 137 135 - 145 mmol/L   Potassium 4.2 3.5 - 5.1 mmol/L   Chloride 105 101 - 111 mmol/L   CO2 26 22 - 32 mmol/L   Glucose, Bld 95 65 - 99 mg/dL   BUN 18 6 - 20 mg/dL   Creatinine, Ser 1.06 0.61 - 1.24 mg/dL   Calcium 9.8 8.9 - 10.3 mg/dL   Total Protein 7.2 6.5 - 8.1 g/dL   Albumin 4.2 3.5 - 5.0 g/dL   AST 22 15 - 41 U/L    ALT 21 17 - 63 U/L  Alkaline Phosphatase 59 38 - 126 U/L   Total Bilirubin 0.8 0.3 - 1.2 mg/dL   GFR calc non Af Amer >60 >60 mL/min   GFR calc Af Amer >60 >60 mL/min    Comment: (NOTE) The eGFR has been calculated using the CKD EPI equation. This calculation has not been validated in all clinical situations. eGFR's persistently <60 mL/min signify possible Chronic Kidney Disease.    Anion gap 6 5 - 15  Protime-INR     Status: None   Collection Time: 04/14/15 11:52 AM  Result Value Ref Range   Prothrombin Time 14.3 11.6 - 15.2 seconds   INR 1.09 0.00 - 1.49  Surgical pcr screen     Status: None   Collection Time: 04/14/15 11:52 AM  Result Value Ref Range   MRSA, PCR NEGATIVE NEGATIVE   Staphylococcus aureus NEGATIVE NEGATIVE    Comment:        The Xpert SA Assay (FDA approved for NASAL specimens in patients over 31 years of age), is one component of a comprehensive surveillance program.  Test performance has been validated by [facility] for patients greater than or equal to 21 year old. It is not intended to diagnose infection nor to guide or monitor treatment.   Type and screen Order type and screen if day of surgery is less than 15 days from draw of preadmission visit or order morning of surgery if day of surgery is greater than 6 days from preadmission visit.     Status: None   Collection Time: 04/14/15 12:00 PM  Result Value Ref Range   ABO/RH(D) AB POS    Antibody Screen NEG    Sample Expiration 04/28/2015    Extend sample reason NO TRANSFUSIONS OR PREGNANCY IN THE PAST 3 MONTHS   ABO/Rh     Status: None   Collection Time: 04/14/15 12:00 PM  Result Value Ref Range   ABO/RH(D) AB POS     Estimated body mass index is 28.47 kg/(m^2) as calculated from the following:   Height as of 03/31/15: 6' (1.829 m).   Weight as of 03/31/15: 95.255 kg (210 lb).   Imaging Review Plain radiographs demonstrate severe degenerative joint disease of the right hip(s). The bone  quality appears to be good for age and reported activity level.  Assessment/Plan:  End stage arthritis, right hip(s)  The patient history, physical examination, clinical judgement of the provider and imaging studies are consistent with end stage degenerative joint disease of the right hip(s) and total hip arthroplasty is deemed medically necessary. The treatment options including medical management, injection therapy, arthroscopy and arthroplasty were discussed at length. The risks and benefits of total hip arthroplasty were presented and reviewed. The risks due to aseptic loosening, infection, stiffness, dislocation/subluxation,  thromboembolic complications and other imponderables were discussed.  The patient acknowledged the explanation, agreed to proceed with the plan and consent was signed. Patient is being admitted for inpatient treatment for surgery, pain control, PT, OT, prophylactic antibiotics, VTE prophylaxis, progressive ambulation and ADL's and discharge planning.The patient is planning to be discharged home with home health services

## 2015-04-19 NOTE — Anesthesia Postprocedure Evaluation (Signed)
Anesthesia Post Note  Patient: Jeffrey Acevedo  Procedure(s) Performed: Procedure(s) (LRB): RIGHT TOTAL HIP ARTHROPLASTY ANTERIOR APPROACH (Right)  Patient location during evaluation: PACU Anesthesia Type: Spinal Level of consciousness: oriented and awake and alert Pain management: pain level controlled Vital Signs Assessment: post-procedure vital signs reviewed and stable Respiratory status: spontaneous breathing, respiratory function stable and patient connected to nasal cannula oxygen Cardiovascular status: blood pressure returned to baseline and stable Postop Assessment: no headache and no backache Anesthetic complications: no    Last Vitals:  Filed Vitals:   04/19/15 1249 04/19/15 1622  BP: 146/73 111/69  Pulse: 57 58  Temp: 36.6 C 36.5 C  Resp: 18 21    Last Pain:  Filed Vitals:   04/19/15 1627  PainSc: 5                  Montez Hageman

## 2015-04-19 NOTE — Transfer of Care (Signed)
Immediate Anesthesia Transfer of Care Note  Patient: Jeffrey Acevedo  Procedure(s) Performed: Procedure(s): RIGHT TOTAL HIP ARTHROPLASTY ANTERIOR APPROACH (Right)  Patient Location: PACU  Anesthesia Type:Spinal  Level of Consciousness: awake, oriented and patient cooperative  Airway & Oxygen Therapy: Patient Spontanous Breathing  Post-op Assessment: Report given to RN and Post -op Vital signs reviewed and stable  Post vital signs: Reviewed and stable  Last Vitals:  Filed Vitals:   04/19/15 1249  BP: 146/73  Pulse: 57  Temp: 36.6 C  Resp: 18    Complications: No apparent anesthesia complications

## 2015-04-19 NOTE — Progress Notes (Signed)
Utilization review completed.  

## 2015-04-19 NOTE — Anesthesia Preprocedure Evaluation (Addendum)
Anesthesia Evaluation  Patient identified by MRN, date of birth, ID band Patient awake    Reviewed: Allergy & Precautions, NPO status , Patient's Chart, lab work & pertinent test results, reviewed documented beta blocker date and time   History of Anesthesia Complications Negative for: history of anesthetic complications  Airway Mallampati: II  TM Distance: >3 FB Neck ROM: Full    Dental  (+) Teeth Intact, Dental Advisory Given   Pulmonary neg pulmonary ROS,    Pulmonary exam normal        Cardiovascular hypertension, Pt. on medications and Pt. on home beta blockers Normal cardiovascular exam  Study Highlights       The left ventricular ejection fraction is normal (55-65%).     Nuclear stress EF: 56%.     There was no ST segment deviation noted during stress.     The study is normal.     This is a low risk study.     Neuro/Psych negative neurological ROS  negative psych ROS   GI/Hepatic Neg liver ROS,   Endo/Other  negative endocrine ROS  Renal/GU negative Renal ROS     Musculoskeletal   Abdominal   Peds  Hematology   Anesthesia Other Findings   Reproductive/Obstetrics                           Anesthesia Physical Anesthesia Plan  ASA: III  Anesthesia Plan: Spinal and MAC   Post-op Pain Management:    Induction:   Airway Management Planned: Nasal Cannula  Additional Equipment: None  Intra-op Plan:   Post-operative Plan: Extubation in OR  Informed Consent: I have reviewed the patients History and Physical, chart, labs and discussed the procedure including the risks, benefits and alternatives for the proposed anesthesia with the patient or authorized representative who has indicated his/her understanding and acceptance.   Dental advisory given  Plan Discussed with: CRNA, Anesthesiologist and Surgeon  Anesthesia Plan Comments:         Anesthesia Quick  Evaluation

## 2015-04-19 NOTE — Discharge Instructions (Signed)

## 2015-04-19 NOTE — Brief Op Note (Signed)
04/19/2015  3:57 PM  PATIENT:  Jeffrey Acevedo  75 y.o. male  PRE-OPERATIVE DIAGNOSIS:  OSTEOARTHRITIS RIGHT HIP  POST-OPERATIVE DIAGNOSIS:  OSTEOARTHRITIS RIGHT HIP  PROCEDURE:  Procedure(s): RIGHT TOTAL HIP ARTHROPLASTY ANTERIOR APPROACH (Right)  SURGEON:  Surgeon(s) and Role:    * Dorna Leitz, MD - Primary  PHYSICIAN ASSISTANT:   ASSISTANTS: bethune   ANESTHESIA:   spinal  EBL:  Total I/O In: 1000 [I.V.:1000] Out: 200 [Urine:200]  BLOOD ADMINISTERED:none  DRAINS: none   LOCAL MEDICATIONS USED:  OTHER experal  SPECIMEN:  No Specimen  DISPOSITION OF SPECIMEN:  N/A  COUNTS:  YES  TOURNIQUET:  * No tourniquets in log *  DICTATION: .Other Dictation: Dictation Number [id number]  PLAN OF CARE: Admit to inpatient   PATIENT DISPOSITION:  PACU - hemodynamically stable.   Delay start of Pharmacological VTE agent (>24hrs) due to surgical blood loss or risk of bleeding: no

## 2015-04-20 ENCOUNTER — Encounter (HOSPITAL_COMMUNITY): Payer: Self-pay | Admitting: Orthopedic Surgery

## 2015-04-20 LAB — BASIC METABOLIC PANEL
Anion gap: 10 (ref 5–15)
BUN: 20 mg/dL (ref 6–20)
CHLORIDE: 101 mmol/L (ref 101–111)
CO2: 23 mmol/L (ref 22–32)
Calcium: 9 mg/dL (ref 8.9–10.3)
Creatinine, Ser: 1.03 mg/dL (ref 0.61–1.24)
GFR calc non Af Amer: >60 mL/min (ref >60)
Glucose, Bld: 151 mg/dL — ABNORMAL HIGH (ref 65–99)
POTASSIUM: 4.2 mmol/L (ref 3.5–5.1)
SODIUM: 134 mmol/L — AB (ref 135–145)

## 2015-04-20 LAB — CBC
HEMATOCRIT: 40.4 % (ref 39.0–52.0)
HEMOGLOBIN: 13.6 g/dL (ref 13.0–17.0)
MCH: 27.4 pg (ref 26.0–34.0)
MCHC: 33.7 g/dL (ref 30.0–36.0)
MCV: 81.3 fL (ref 78.0–100.0)
Platelets: 211 10*3/uL (ref 150–400)
RBC: 4.97 MIL/uL (ref 4.22–5.81)
RDW: 14 % (ref 11.5–15.5)
WBC: 14.9 10*3/uL — ABNORMAL HIGH (ref 4.0–10.5)

## 2015-04-20 MED ORDER — FENOFIBRATE 54 MG PO TABS
54.0000 mg | ORAL_TABLET | ORAL | Status: DC
  Administered 2015-04-20: 54 mg via ORAL
  Filled 2015-04-20: qty 1

## 2015-04-20 NOTE — Evaluation (Signed)
Physical Therapy Evaluation Patient Details Name: Jeffrey Acevedo MRN: [id number] DOB: 1941-01-31 Today's Date: 04/20/2015   History of Present Illness  Pt admitted for R THA. PMHx: bil hearing loss, HTN, HLD, CAD, prostate CA  Clinical Impression  Pt very pleasant and moving well. Pt educated for HEP, plan, RW use, progression and gait. Pt with decreased strength, gait and transfers who will benefit from acute therapy to maximize mobility, function and gait to increase independence.     Follow Up Recommendations Home health PT    Equipment Recommendations  3in1 (PT)    Recommendations for Other Services       Precautions / Restrictions Precautions Precautions: Fall Restrictions Weight Bearing Restrictions: Yes RLE Weight Bearing: Weight bearing as tolerated      Mobility  Bed Mobility Overal bed mobility: Modified Independent             General bed mobility comments: with use of rail and increased time  Transfers Overall transfer level: Needs assistance   Transfers: Sit to/from Stand Sit to Stand: Supervision         General transfer comment: cues for hand placement and safety  Ambulation/Gait Ambulation/Gait assistance: Supervision Ambulation Distance (Feet): 500 Feet Assistive device: Rolling walker (2 wheeled) Gait Pattern/deviations: Step-through pattern;Decreased stride length   Gait velocity interpretation: Below normal speed for age/gender General Gait Details: cues for posture, looking up, hip and knee flexion on RLE  Stairs Stairs: Yes Stairs assistance: Supervision Stair Management: Step to pattern;Sideways;One rail Right Number of Stairs: 4 General stair comments: cues for sequence  Wheelchair Mobility    Modified Rankin (Stroke Patients Only)       Balance                                             Pertinent Vitals/Pain Pain Assessment: 0-10 Pain Score: 3  Pain Location: right hip Pain Descriptors /  Indicators: Sore Pain Intervention(s): Limited activity within patient's tolerance;Monitored during session;Premedicated before session;Repositioned    Home Living Family/patient expects to be discharged to:: Private residence Living Arrangements: Spouse/significant other Available Help at Discharge: Family;Available 24 hours/day Type of Home: House Home Access: Stairs to enter Entrance Stairs-Rails: Right Entrance Stairs-Number of Steps: 7 Home Layout: Multi-level Home Equipment: Walker - 2 wheels;Cane - single point      Prior Function Level of Independence: Independent               Hand Dominance        Extremity/Trunk Assessment   Upper Extremity Assessment: Overall WFL for tasks assessed           Lower Extremity Assessment: RLE deficits/detail RLE Deficits / Details: decreased ROM and strength as expected post op    Cervical / Trunk Assessment: Normal  Communication   Communication: HOH  Cognition Arousal/Alertness: Awake/alert Behavior During Therapy: WFL for tasks assessed/performed Overall Cognitive Status: Within Functional Limits for tasks assessed                      General Comments      Exercises Total Joint Exercises Heel Slides: AROM;Right;10 reps;Supine Hip ABduction/ADduction: AROM;Right;10 reps;Supine      Assessment/Plan    PT Assessment Patient needs continued PT services  PT Diagnosis Difficulty walking;Acute pain   PT Problem List Decreased strength;Decreased range of motion;Decreased activity tolerance;Decreased mobility;Pain  PT Treatment Interventions  DME instruction;Gait training;Stair training;Therapeutic activities;Functional mobility training;Therapeutic exercise;Patient/family education   PT Goals (Current goals can be found in the Care Plan section) Acute Rehab PT Goals Patient Stated Goal: return to painting, hunting, gardening PT Goal Formulation: With patient/family Time For Goal Achievement:  04/27/15 Potential to Achieve Goals: Good    Frequency 7X/week   Barriers to discharge        Co-evaluation               End of Session Equipment Utilized During Treatment: Gait belt Activity Tolerance: Patient tolerated treatment well Patient left: Other (comment);with call bell/phone within reach;with family/visitor present (in bathroom with NT and RN aware) Nurse Communication: Mobility status;Weight bearing status         Time: 2956-2130 PT Time Calculation (min) (ACUTE ONLY): 24 min   Charges:   PT Evaluation $PT Eval Moderate Complexity: 1 Procedure PT Treatments $Gait Training: 8-22 mins   PT G CodesDelorse Lek 04/20/2015, 9:12 AM  Delaney Meigs, PT [phone]

## 2015-04-20 NOTE — Progress Notes (Signed)
Occupational Therapy Evaluation/Discharge Patient Details Name: Jeffrey Acevedo MRN: [id number] DOB: [birth year] Today's Date: 04/20/2015    History of Present Illness Pt admitted for R THA. PMHx: bil hearing loss, HTN, HLD, CAD, prostate CA   Clinical Impression   PTA, pt was independent with all ADLs and mobility. During therapy session, pt completed all functional transfers at supervision level and required min assist to reach R foot for bathing/dressing tasks which his wife can provide at home. Educated pt on fall prevention, energy conservation, gradually increasing activity level, compensatory strategies for ADLs, and pain/edema management strategies. Reviewed pt's HEP. All education has been completed and pt has no further questions. PT with no further acute OT needs. OT signing off.     Follow Up Recommendations  No OT follow up;Supervision - Intermittent    Equipment Recommendations  None recommended by OT    Recommendations for Other Services       Precautions / Restrictions Precautions Precautions: Fall Restrictions Weight Bearing Restrictions: Yes RLE Weight Bearing: Weight bearing as tolerated      Mobility Bed Mobility Overal bed mobility: Modified Independent             General bed mobility comments: Pt up in chair on OT arrival  Transfers Overall transfer level: Needs assistance Equipment used: Rolling walker (2 wheeled) Transfers: Sit to/from Stand Sit to Stand: Supervision         General transfer comment: Supervision for safety. Good demonstration of safe hand placement on seated surfaces.    Balance Overall balance assessment: Needs assistance Sitting-balance support: No upper extremity supported;Feet supported Sitting balance-Leahy Scale: Good     Standing balance support: Bilateral upper extremity supported;During functional activity Standing balance-Leahy Scale: Good Standing balance comment: Able to maintain balance without UE  support for 10 minutes at sink. Took several steps without RW with no overt LOB                            ADL Overall ADL's : Needs assistance/impaired     Grooming: Wash/dry hands;Applying deodorant;Oral care;Wash/dry face;Brushing hair;Supervision/safety;Standing   Upper Body Bathing: Set up;Sitting   Lower Body Bathing: Minimal assistance;Sit to/from stand Lower Body Bathing Details (indicate cue type and reason): To reach R foot, advised pt to use long-handled sponge Upper Body Dressing : Set up;Sitting   Lower Body Dressing: Minimal assistance;Sit to/from stand Lower Body Dressing Details (indicate cue type and reason): To don R sock, advised pt's wife to assist Toilet Transfer: Supervision/safety;Cueing for safety;Ambulation;BSC;RW Toilet Transfer Details (indicate cue type and reason): BSC over toilet, cues to feel BSC on back of legs before sitting Toileting- Clothing Manipulation and Hygiene: Supervision/safety;Sit to/from stand   Tub/ Shower Transfer: Walk-in shower;Supervision/safety;Cueing for sequencing;Ambulation;Rolling walker Tub/Shower Transfer Details (indicate cue type and reason): cues for step sequence with RW Functional mobility during ADLs: Supervision/safety;Rolling walker General ADL Comments: Practiced all functional transfers and ADLs. Pt will use assist from wife for LB ADLs on R side and will have supervision from her for shower transfers. Reviewed compensatory strategies for ADLs, energy conservation, and fall prevention strategies.     Vision Vision Assessment?: No apparent visual deficits   Perception     Praxis      Pertinent Vitals/Pain Pain Assessment: 0-10 Pain Score: 4  Pain Location: R hip Pain Descriptors / Indicators: Sore Pain Intervention(s): Limited activity within patient's tolerance;Monitored during session;Repositioned;Ice applied     Hand Dominance Right   Extremity/Trunk  Assessment Upper Extremity  Assessment Upper Extremity Assessment: Overall WFL for tasks assessed   Lower Extremity Assessment Lower Extremity Assessment: RLE deficits/detail RLE Deficits / Details: decreased ROM and strength as expected post op   Cervical / Trunk Assessment Cervical / Trunk Assessment: Normal   Communication Communication Communication: HOH   Cognition Arousal/Alertness: Awake/alert Behavior During Therapy: WFL for tasks assessed/performed Overall Cognitive Status: Within Functional Limits for tasks assessed                     General Comments       Exercises       Shoulder Instructions      Home Living Family/patient expects to be discharged to:: Private residence Living Arrangements: Spouse/significant other Available Help at Discharge: Family;Available 24 hours/day Type of Home: House Home Access: Stairs to enter [employer] of Steps: 7 Entrance Stairs-Rails: Right Home Layout: Multi-level Alternate Level Stairs-Number of Steps: 6   Bathroom Shower/Tub: Walk-in shower;Door   [employer] Toilet: Standard Bathroom Accessibility: Yes How Accessible: Accessible via walker Home Equipment: Walker - 2 wheels;Cane - single point;Bedside commode;Shower seat;Adaptive equipment Adaptive Equipment: Long-handled sponge Additional Comments: Pt has access to [facility] and shower seat from family members      Prior Functioning/Environment Level of Independence: Independent        Comments: Used to work as a Network engineer, currently owns a [employer]    OT Diagnosis: Acute pain   OT Problem List: Decreased strength;Decreased range of motion;Decreased activity tolerance;Impaired balance (sitting and/or standing);Decreased coordination;Decreased safety awareness;Decreased knowledge of use of DME or AE;Pain   OT Treatment/Interventions:      OT Goals(Current goals can be found in the care plan section) Acute Rehab OT Goals Patient Stated  Goal: to be able to play with my grandkids OT Goal Formulation: With patient Time For Goal Achievement: 05/04/15 Potential to Achieve Goals: Good  OT Frequency:     Barriers to D/C:            Co-evaluation              End of Session Equipment Utilized During Treatment: Gait belt;Rolling walker Nurse Communication: Mobility status  Activity Tolerance: Patient tolerated treatment well Patient left: in chair;with call bell/phone within reach   Time: 0929-1016 OT Time Calculation (min): 47 min Charges:  OT General Charges $OT Visit: 1 Procedure OT Evaluation $OT Eval Moderate Complexity: 1 Procedure OT Treatments $Self Care/Home Management : 23-37 mins G-Codes:    Nils Pyle, OTR/L Pager: [phone] 04/20/2015, 10:56 AM

## 2015-04-20 NOTE — Progress Notes (Signed)
Physical Therapy Treatment Patient Details Name: Jeffrey Acevedo MRN: [id number] DOB: 1940/09/04 Today's Date: 04/20/2015    History of Present Illness Pt admitted for R THA. PMHx: bil hearing loss, HTN, HLD, CAD, prostate CA    PT Comments    Pt continues to progress with mobility and gait. Pt demonstrates excellent gait tolerance and strength. Pt attempted gait without RW grossly 10' but increased limp without DME use and returned to RW use. Pt educated for HEP and function. Pt safe for D/C home.   Follow Up Recommendations  Home health PT     Equipment Recommendations  3in1 (PT)    Recommendations for Other Services       Precautions / Restrictions Precautions Precautions: Fall Restrictions Weight Bearing Restrictions: Yes RLE Weight Bearing: Weight bearing as tolerated    Mobility  Bed Mobility Overal bed mobility: Modified Independent             General bed mobility comments: Pt up in chair on arrival  Transfers Overall transfer level: Modified independent Equipment used: Rolling walker (2 wheeled) Transfers: Sit to/from Stand Sit to Stand: Supervision         General transfer comment: Supervision for safety. Good demonstration of safe hand placement on seated surfaces.  Ambulation/Gait Ambulation/Gait assistance: Supervision Ambulation Distance (Feet): 500 Feet Assistive device: Rolling walker (2 wheeled) Gait Pattern/deviations: Step-through pattern;Decreased stride length   Gait velocity interpretation: Below normal speed for age/gender General Gait Details: cues for posture, looking up, hip and knee flexion on RLE   Stairs Stairs: Yes Stairs assistance: Modified independent (Device/Increase time) Stair Management: One rail Right;Step to pattern;Sideways Number of Stairs: 6 General stair comments: no cues needed this trial  Wheelchair Mobility    Modified Rankin (Stroke Patients Only)       Balance                     Cognition Arousal/Alertness: Awake/alert Behavior During Therapy: WFL for tasks assessed/performed Overall Cognitive Status: Within Functional Limits for tasks assessed                      Exercises Total Joint Exercises Heel Slides: AROM;Right;10 reps;Supine Hip ABduction/ADduction: AROM;Standing;Right;10 reps Long Arc Quad: AROM;Seated;Right;10 reps Knee Flexion: AROM;Standing;Right;10 reps Marching in Standing: AROM;Right;10 reps;Standing Standing Hip Extension: AROM;Standing;Right;10 reps    General Comments        Pertinent Vitals/Pain Pain Assessment: 0-10 Pain Score: 2  Pain Location: right thigh Pain Descriptors / Indicators: Aching Pain Intervention(s): Limited activity within patient's tolerance;Monitored during session;Repositioned;Ice applied    Home Living   Prior Function    PT Goals (current goals can now be found in the care plan section) Acute Rehab PT Goals Patient Stated Goal: to be able to play with my grandkids PT Goal Formulation: With patient/family Time For Goal Achievement: 04/27/15 Potential to Achieve Goals: Good Progress towards PT goals: Progressing toward goals    Frequency  7X/week    PT Plan Current plan remains appropriate    Co-evaluation             End of Session Equipment Utilized During Treatment: Gait belt Activity Tolerance: Patient tolerated treatment well Patient left: in chair;with call bell/phone within reach     Time: 1213-1229 PT Time Calculation (min) (ACUTE ONLY): 16 min  Charges:  $Gait Training: 8-22 mins                    G Codes:  Toney Sang Beth 04/20/2015, 12:45 PM Delaney Meigs, PT [phone]

## 2015-04-20 NOTE — Progress Notes (Signed)
Subjective: 1 Day Post-Op Procedure(s) (LRB): RIGHT TOTAL HIP ARTHROPLASTY ANTERIOR APPROACH (Right) Patient reports pain as mild.  Taking by mouth. Foley removed this morning. Hasn't voided yet. Has not been out of bed yet. He had surgery late yesterday afternoon.  Objective: Vital signs in last 24 hours: Temp:  [97.4 F (36.3 C)-97.9 F (36.6 C)] 97.4 F (36.3 C) (01/31 0437) Pulse Rate:  [51-70] 64 (01/31 0437) Resp:  [8-21] 18 (01/31 0437) BP: (111-149)/(65-78) 131/75 mmHg (01/31 0437) SpO2:  [90 %-100 %] 90 % (01/31 0437) Weight:  [95.528 kg (210 lb 9.6 oz)] 95.528 kg (210 lb 9.6 oz) (01/30 1249)  Intake/Output from previous day: 01/30 0701 - 01/31 0700 In: 1200 [I.V.:1200] Out: 1325 [Urine:1325] Intake/Output this shift:     Recent Labs  04/20/15 0652  HGB 13.6    Recent Labs  04/20/15 0652  WBC 14.9*  RBC 4.97  HCT 40.4  PLT 211   No results for input(s): NA, K, CL, CO2, BUN, CREATININE, GLUCOSE, CALCIUM in the last 72 hours. No results for input(s): LABPT, INR in the last 72 hours. Right hip exam: Neurovascular intact Sensation intact distally Intact pulses distally Dorsiflexion/Plantar flexion intact Incision: dressing C/D/I Compartment soft  Assessment/Plan: 1 Day Post-Op Procedure(s) (LRB): RIGHT TOTAL HIP ARTHROPLASTY ANTERIOR APPROACH (Right) Plan: Continue Plavix with aspirin for DVT prophylaxis. Weight-bear as tolerated on the right without hip precautions. Up with therapy Plan for discharge tomorrow with home health physical therapy.  Leslee Haueter G 04/20/2015, 8:19 AM

## 2015-04-20 NOTE — Op Note (Signed)
NAME:  FELDER, CRISCIONE NO.:  [id number]  MEDICAL RECORD NO.:  [id number]  LOCATION:                                 FACILITY:  PHYSICIAN:  Alta Corning, M.D.        DATE OF BIRTH:  DATE OF PROCEDURE:  04/19/2015 DATE OF DISCHARGE:                              OPERATIVE REPORT   PREOPERATIVE DIAGNOSIS:  End-stage degenerative joint disease, right hip.  POSTOPERATIVE DIAGNOSIS:  End-stage degenerative joint disease, right hip.  PROCEDURE: 1. Right total hip replacement through an anterior approach. 2. Interpretation of multiple intra-operative fluoroscopic images. SURGEON:  Alta Corning, M.D.  ASSISTANT:  Gary Fleet, P.A.  ANESTHESIA:  General.  BRIEF HISTORY:  Mr. Ethel is a 75 year old male with a long history of significant complaints of right hip pain.  He had been treated conservatively for a long period of time.  After failure of all conservative care, he was taken to the operating room for right total hip replacement.  Because of his youthful age and high activity level, we felt that anterior approach was appropriate and this was chosen.  We discussed this preoperatively, and he was brought to the operating room for this procedure.  The patient was having night pain and light activity pain prior to surgery.  DESCRIPTION OF PROCEDURE:  The patient was taken to the operative room. After adequate anesthesia was obtained with spinal anesthetic, the patient was placed supine on the operating table.  The traction boots were put in place, and he was transferred to the [facility] bed.  Once this was done, preoperative fluoroscopic images were taken and addressed; and once this was done, the hip was prepped and draped in usual sterile fashion.  Following this, an incision made for an anterior approach to the hip, subcutaneous tissues down to the level of tensor fascia, clearly identified, rent was made in the fascia, and the fascia was finger fractured  off the posterior aspect of the fascia.  At that point, retractors were put in place superior and inferior to the neck, and the vessels going into the hip were then cauterized; and following this, the vastus was released to go laterally.  Once this was done, the capsule was opened and tagged.  The hip was then essentially dislocated with external rotation and use of a Cobb elevator to make sure that we could get it out loose.  Once this was done, a provisional neck cut was made and the head ball was removed.  Acetabulum was then identified. Retractors were put in place, sequentially reamed to a level of 55 mm and a 56 mm Pinnacle porous-coated cup was put in place at that point, it got excellent fit and fill with 45 degrees of lateral opening and 30 degrees of anteversion.  Once this was done, a +4 neutral liner was placed.  Attention was turned to the stem side.  The cookie cutter and chili pepper were used followed by sequential rasping up to a level of 12, did a reduction at this point just to get things measured, were about 2-3 mm long at this point and felt that the 12 rasp was the appropriate size.  So, we dislocated the hip, went back, took this down about 3 mm, used a rongeur to rongeur the neck and then a calcar planer to plane it down and then put the final size 12 implant and with a +0 ball delta ceramic 36 mm.  Once this was done, the hip was reduced. Fluoroscopic images were taken as well as repeat look at leg lengths which were now perfect.  At this time, the wound was irrigated, suctioned dry.  The anterior capsule was closed with interrupted Vicryl and running Vicryl.  The tensor fascia was closed with 0 Vicryl running, the skin with 0 and 2-0 Vicryl and 3-0 Monocryl subcuticular.  Benzoin and Steri-Strips were applied.  Sterile compressive dressing was applied.  A 40 mL of 20 mL Exparel and 20 mL 0.25% Marcaine were instilled all around the capsule and the wound for  postoperative anesthesia.  At this point, the patient was taken to the recovery room, was noted to be in satisfactory condition.  Estimated blood loss for procedure was 500 mL.  The final amounts can be gotten from the anesthetic record.  Of note, multiple intraoperative fluoroscopic images were taken and interpreted by me, to ensure the appropriate angles of the cup and lengths of the leg and fit of the femoral component.     Alta Corning, M.D.     Corliss Skains  D:  04/19/2015  T:  04/19/2015  Job:  [id number]

## 2015-04-20 NOTE — Care Management Note (Signed)
Case Management Note  Patient Details  Name: Jeffrey Acevedo MRN: [id number] Date of Birth: November 23, 1940  Subjective/Objective:         S/p right total hip arthroplasty           Action/Plan: Spoke with patient about discharge plan, he selected [facility]. Contacted Susan at [facility] and set up [place]. Patient stated that he has a rolling walker and 3N1 at home and his wife will be able to assist him after discharge.     Expected Discharge Date:                  Expected Discharge Plan:     In-House Referral:     Discharge planning Services     Post Acute Care Choice:    Choice offered to:     DME Arranged:    DME Agency:     HH Arranged:    [place] Agency:     Status of Service:     Medicare Important Message Given:    Date Medicare IM Given:    Medicare IM give by:    Date Additional Medicare IM Given:    Additional Medicare Important Message give by:     If discussed at [place] of Stay Meetings, dates discussed:    Additional Comments:  Nila Nephew, RN 04/20/2015, 11:24 AM

## 2015-04-21 LAB — CBC
HEMATOCRIT: 39.2 % (ref 39.0–52.0)
HEMOGLOBIN: 13.1 g/dL (ref 13.0–17.0)
MCH: 27.4 pg (ref 26.0–34.0)
MCHC: 33.4 g/dL (ref 30.0–36.0)
MCV: 82 fL (ref 78.0–100.0)
Platelets: 249 10*3/uL (ref 150–400)
RBC: 4.78 MIL/uL (ref 4.22–5.81)
RDW: 14.4 % (ref 11.5–15.5)
WBC: 18.5 10*3/uL — AB (ref 4.0–10.5)

## 2015-04-21 NOTE — Progress Notes (Signed)
Physical Therapy Treatment Patient Details Name: Jeffrey Acevedo MRN: [id number] DOB: 1940/06/27 Today's Date: 04/21/2015    History of Present Illness Pt admitted for R THA. PMHx: bil hearing loss, HTN, HLD, CAD, prostate CA    PT Comments    Pt has progressed remarkably well, has met goals and is d/c'ed from acute PT to be followed by HHPT. Pt is very driven and warned him against "overdoing it" when he gets home. Thoroughly discussed appropriate activities and activity level for first month. Pt ambulated over 500' at mod I level with RW. PT signing off.     Follow Up Recommendations  Home health PT     Equipment Recommendations  3in1 (PT)    Recommendations for Other Services       Precautions / Restrictions Precautions Precautions: Fall Restrictions Weight Bearing Restrictions: Yes RLE Weight Bearing: Weight bearing as tolerated    Mobility  Bed Mobility Overal bed mobility: Modified Independent             General bed mobility comments: pt getting in and out of bed independently as well as around room  Transfers Overall transfer level: Modified independent Equipment used: Rolling walker (2 wheeled) Transfers: Sit to/from Stand Sit to Stand: Modified independent (Device/Increase time)         General transfer comment: pt standing with safety with RW in front of him as well as without it.   Ambulation/Gait Ambulation/Gait assistance: Modified independent (Device/Increase time) Ambulation Distance (Feet): 520 Feet Assistive device: Rolling walker (2 wheeled) Gait Pattern/deviations: Step-through pattern;Decreased weight shift to right Gait velocity: WFL Gait velocity interpretation: at or above normal speed for age/gender General Gait Details: cues for upright posture as well as advised pt to not rush to get off RW so that he has normal gait pattern without RW for stopping use. Tightness noted right hip flexors   Stairs            Wheelchair  Mobility    Modified Rankin (Stroke Patients Only)       Balance Overall balance assessment: Modified Independent Sitting-balance support: No upper extremity supported       Standing balance support: No upper extremity supported Standing balance-Leahy Scale: Good Standing balance comment: pt able to stand and was up at sink as well as reaching across counter for objects without UE support and no LOB                    Cognition Arousal/Alertness: Awake/alert Behavior During Therapy: WFL for tasks assessed/performed Overall Cognitive Status: Within Functional Limits for tasks assessed                      Exercises Total Joint Exercises Hip ABduction/ADduction: AROM;Standing;Right;10 reps Long Arc Quad: AROM;Seated;Right;5 reps Knee Flexion: AROM;Standing;Right;5 reps Marching in Standing: AROM;Right;10 reps;Standing Standing Hip Extension: AROM;Standing;Right;10 reps Other Exercises Other Exercises: discussed and demonstrated several different options for stretching right hip flexors    General Comments General comments (skin integrity, edema, etc.): discussed car transfer, activity level upon d/c, activities to avoid first few weeks       Pertinent Vitals/Pain Pain Assessment: Faces Faces Pain Scale: Hurts a little bit Pain Location: right thigh Pain Descriptors / Indicators: Aching Pain Intervention(s): Premedicated before session;Limited activity within patient's tolerance    Home Living                      Prior Function  PT Goals (current goals can now be found in the care plan section) Acute Rehab PT Goals Patient Stated Goal: to be able to play with my grandkids PT Goal Formulation: With patient/family Time For Goal Achievement: 04/27/15 Potential to Achieve Goals: Good Progress towards PT goals: Goals met/education completed, patient discharged from PT    Frequency  7X/week    PT Plan Current plan remains  appropriate    Co-evaluation             End of Session Equipment Utilized During Treatment: Gait belt Activity Tolerance: Patient tolerated treatment well Patient left: in chair;with call bell/phone within reach     Time: 0823-0854 PT Time Calculation (min) (ACUTE ONLY): 31 min  Charges:  $Gait Training: 23-37 mins                    G Codes:     Lyanne Co, PT  Acute Rehab Services  [phone]  Lyanne Co 04/21/2015, 12:19 PM

## 2015-04-21 NOTE — Progress Notes (Addendum)
Subjective: Pt seen at 6:50 this AM and note written later that day 2 Days Post-Op Procedure(s) (LRB): RIGHT TOTAL HIP ARTHROPLASTY ANTERIOR APPROACH (Right) Patient reports pain as mild.    Objective: Vital signs in last 24 hours: Temp:  [97.7 F (36.5 C)-98.1 F (36.7 C)] 97.7 F (36.5 C) (02/01 0510) Pulse Rate:  [68-76] 68 (02/01 0510) Resp:  [17] 17 (02/01 0510) BP: (140-143)/(66-71) 140/71 mmHg (02/01 0510) SpO2:  [95 %-97 %] 95 % (02/01 0510)  Intake/Output from previous day: 01/31 0701 - 02/01 0700 In: 840 [P.O.:840] Out: 550 [Urine:550] Intake/Output this shift:     Recent Labs  04/20/15 0652 04/21/15 0546  HGB 13.6 13.1    Recent Labs  04/20/15 0652 04/21/15 0546  WBC 14.9* 18.5*  RBC 4.97 4.78  HCT 40.4 39.2  PLT 211 249    Recent Labs  04/20/15 0652  NA 134*  K 4.2  CL 101  CO2 23  BUN 20  CREATININE 1.03  GLUCOSE 151*  CALCIUM 9.0   No results for input(s): LABPT, INR in the last 72 hours.  Neurologically intact ABD soft Neurovascular intact Sensation intact distally Intact pulses distally No cellulitis present Compartment soft  Assessment/Plan: 2 Days Post-Op Procedure(s) (LRB): RIGHT TOTAL HIP ARTHROPLASTY ANTERIOR APPROACH (Right) [facility] diet Up with therapy Discharge home with home health  Narelle Schoening L 04/21/2015, 2:33 PM

## 2015-04-21 NOTE — Discharge Summary (Signed)
Patient ID: MOSHEH EYMARD MRN: [id number] DOB/AGE: 1940-08-26 75 y.o.  Admit date: 04/19/2015 Discharge date: 04/21/2015  Admission Diagnoses:  Principal Problem:   Primary osteoarthritis of right hip   Discharge Diagnoses:  Same  Past Medical History  Diagnosis Date  . Hypertension   . Hyperlipidemia   . Degenerative joint disease     Right hip  . Coronary artery disease     sees Dr. Erlene Quan 3 mths ago.  Stress test 03/31/2015 in epic  . Cancer [facility])     prostate cancer surgery 2011  . Hearing loss of both ears     only at present wears the right ear hearing aid    Surgeries: Procedure(s): RIGHT TOTAL HIP ARTHROPLASTY ANTERIOR APPROACH on 04/19/2015   Discharged Condition: Improved  Hospital Course: OTHELL MCQUEARY is an 75 y.o. male who was admitted 04/19/2015 for operative treatment ofPrimary osteoarthritis of right hip. Patient has severe unremitting pain that affects sleep, daily activities, and work/hobbies. After pre-op clearance the patient was taken to the operating room on 04/19/2015 and underwent  Procedure(s): RIGHT TOTAL HIP ARTHROPLASTY ANTERIOR APPROACH.    Patient was given perioperative antibiotics: Anti-infectives    Start     Dose/Rate Route Frequency Ordered Stop   04/19/15 2000  clindamycin (CLEOCIN) IVPB 600 mg     600 mg 100 mL/hr over 30 Minutes Intravenous Every 6 hours 04/19/15 1811 04/20/15 0116   04/19/15 1500  clindamycin (CLEOCIN) IVPB 900 mg     900 mg 100 mL/hr over 30 Minutes Intravenous To ShortStay Surgical 04/16/15 0834 04/19/15 1403       Patient was given sequential compression devices, early ambulation, and chemoprophylaxis to prevent DVT. He progressed well with PT.  Patient benefited maximally from hospital stay and there were no complications.    Recent vital signs: Patient Vitals for the past 24 hrs:  BP Temp Temp src Pulse Resp SpO2  04/21/15 0510 140/71 mmHg 97.7 F (36.5 C) - 68 17 95 %  04/20/15 2050 (!) 143/66 mmHg 98.1  F (36.7 C) Oral 76 17 97 %  04/20/15 1300 124/65 mmHg 98.1 F (36.7 C) - 67 18 94 %     Recent laboratory studies:  Recent Labs  04/20/15 0652 04/21/15 0546  WBC 14.9* 18.5*  HGB 13.6 13.1  HCT 40.4 39.2  PLT 211 249  NA 134*  --   K 4.2  --   CL 101  --   CO2 23  --   BUN 20  --   CREATININE 1.03  --   GLUCOSE 151*  --   CALCIUM 9.0  --      Discharge Medications:     Medication List    TAKE these medications        amLODipine 10 MG tablet  Commonly known as:  NORVASC  TAKE ONE-HALF TABLET BY MOUTH ONCE DAILY     aspirin EC 325 MG tablet  Take 325 mg by mouth daily.     clopidogrel 75 MG tablet  Commonly known as:  PLAVIX  TAKE ONE TABLET BY MOUTH ONCE DAILY     Fenofibric Acid 45 MG Cpdr  Take 1 capsule by mouth every other day.     Fish Oil 1000 MG Caps  Take 1 capsule by mouth every other day.     lisinopril 40 MG tablet  Commonly known as:  PRINIVIL,ZESTRIL  TAKE ONE TABLET BY MOUTH ONCE DAILY     metoprolol tartrate 25 MG  tablet  Commonly known as:  LOPRESSOR  TAKE ONE TABLET BY MOUTH ONCE DAILY     oxyCODONE-acetaminophen 5-325 MG tablet  Commonly known as:  PERCOCET/ROXICET  Take 1-2 tablets by mouth every 4 (four) hours as needed for severe pain.     pravastatin 40 MG tablet  Commonly known as:  PRAVACHOL  TAKE ONE TABLET BY MOUTH ONCE DAILY     spironolactone 25 MG tablet  Commonly known as:  ALDACTONE  TAKE ONE TABLET BY MOUTH ONCE DAILY     tiZANidine 2 MG tablet  Commonly known as:  ZANAFLEX  Take 1 tablet (2 mg total) by mouth every 8 (eight) hours as needed for muscle spasms.     valsartan-hydrochlorothiazide 160-25 MG tablet  Commonly known as:  DIOVAN-HCT  TAKE ONE TABLET BY MOUTH ONCE DAILY        Diagnostic Studies: Dg Chest 2 View  04/14/2015  CLINICAL DATA:  Preop right hip replacement. EXAM: CHEST  2 VIEW COMPARISON:  06/15/2009 FINDINGS: Low lung volumes. No confluent opacities or effusions. Heart is normal size.  No acute bony abnormality. IMPRESSION: Low lung volumes.  No active disease. Electronically Signed   By: Charlett Nose M.D.   On: 04/14/2015 13:42   Dg Hip Operative Unilat With Pelvis Right  04/19/2015  CLINICAL DATA:  Surgery: Right AA Hip Fluoro Time: 18 Sec EXAM: OPERATIVE RIGHT HIP (WITH PELVIS IF PERFORMED) to VIEWS TECHNIQUE: Fluoroscopic spot image(s) were submitted for interpretation post-operatively. COMPARISON:  None. FINDINGS: Frontal images are performed, demonstrating right hip arthroplasty. There is no evidence for dislocation or interval fracture. IMPRESSION: Status post right hip arthroplasty.  No adverse features. Electronically Signed   By: Norva Pavlov M.D.   On: 04/19/2015 15:52    Disposition:       Discharge Instructions    Call MD / Call 911    Complete by:  As directed   If you experience chest pain or shortness of breath, CALL 911 and be transported to the hospital emergency room.  If you develope a fever above 101 F, pus (white drainage) or increased drainage or redness at the wound, or calf pain, call your surgeon's office.     Constipation Prevention    Complete by:  As directed   Drink plenty of fluids.  Prune juice may be helpful.  You may use a stool softener, such as Colace (over the counter) 100 mg twice a day.  Use MiraLax (over the counter) for constipation as needed.     Diet general    Complete by:  As directed      Do not sit on low chairs, stoools or toilet seats, as it may be difficult to get up from low surfaces    Complete by:  As directed      Increase activity slowly as tolerated    Complete by:  As directed      Weight bearing as tolerated    Complete by:  As directed   Laterality:  right  Extremity:  Lower           Follow-up Information    Follow up with GRAVES,JOHN L, MD. Schedule an appointment as soon as possible for a visit in 2 weeks.   Specialty:  Orthopedic Surgery   Contact information:   [address] [place]  [address] [phone]       Follow up with [facility].   Why:  They will contact you to schedule home  therapy visits.    Contact information:   [address] [place] [address] [phone]        Signed: Matthew Folks 04/21/2015, 12:29 PM

## 2015-04-21 NOTE — Progress Notes (Signed)
Pt. Got d/c instructions,prescriptions and follow up appointments.IV was d/c.Pt. Ready to go home with his daughter.

## 2015-05-31 ENCOUNTER — Other Ambulatory Visit: Payer: Self-pay | Admitting: Cardiovascular Disease

## 2015-05-31 NOTE — Telephone Encounter (Signed)
REFILL 

## 2015-09-28 ENCOUNTER — Other Ambulatory Visit: Payer: Self-pay

## 2015-09-28 MED ORDER — SPIRONOLACTONE 25 MG PO TABS: 25.0000 mg | ORAL_TABLET | Freq: Every day | ORAL | Status: DC

## 2015-12-13 ENCOUNTER — Telehealth: Payer: Self-pay | Admitting: Cardiovascular Disease

## 2015-12-13 NOTE — Telephone Encounter (Signed)
Records received from [facility] for apt on 12/14/15 with Dr Gwenlyn Found. Records given to [employer] (medical records) CN

## 2015-12-14 ENCOUNTER — Encounter: Payer: Self-pay | Admitting: Cardiovascular Disease

## 2015-12-14 ENCOUNTER — Ambulatory Visit (INDEPENDENT_AMBULATORY_CARE_PROVIDER_SITE_OTHER): Payer: Medicare Other | Admitting: Cardiovascular Disease

## 2015-12-14 VITALS — BP 126/72 | HR 46 | Ht 73.0 in | Wt 203.0 lb

## 2015-12-14 DIAGNOSIS — E785 Hyperlipidemia, unspecified: Secondary | ICD-10-CM

## 2015-12-14 DIAGNOSIS — I10 Essential (primary) hypertension: Secondary | ICD-10-CM | POA: Diagnosis not present

## 2015-12-14 DIAGNOSIS — I2583 Coronary atherosclerosis due to lipid rich plaque: Secondary | ICD-10-CM

## 2015-12-14 DIAGNOSIS — I2510 Atherosclerotic heart disease of native coronary artery without angina pectoris: Secondary | ICD-10-CM

## 2015-12-14 NOTE — Assessment & Plan Note (Signed)
History of hypertension blood pressure measured today at 126/72. He is on amlodipine, lisinopril and spironolactone as well as metoprolol. Continue current meds at current dosing

## 2015-12-14 NOTE — Assessment & Plan Note (Signed)
History of CAD status post LAD stenting by myself using bare-metal stent 04/16/96. He had a normal Myoview February 2011 and was scheduled to have robotic prostatectomy because of prostate cancer by Dr. Alinda Money. He was complaining of exertional chest pain similar to his pre-stent symptoms and because of this and despite a negative Myoview I recatheterized him 06/17/09 revealing a patent proximal LAD stent with 90% stenosis just beyond this which I stented using a MultiLink vision bare metal stent with excellent result. He denies chest pain or shortness of breath. The Myoview performed earlier this year 03/31/15 for preoperative clearance before right total hip replacement was normal.

## 2015-12-14 NOTE — Assessment & Plan Note (Signed)
History of hyperlipidemia on statin therapy with recent lipid profile performed by his PCP 12/08/15 revealed a total cholesterol 145, LDL 86 and HDL of 39.

## 2015-12-14 NOTE — Progress Notes (Signed)
12/14/2015 Jeffrey Acevedo   [birth year]  [id number]  Primary Physician Jeffrey Rossetti, MD Primary Cardiologist: Jeffrey Gess MD Jeffrey Acevedo  HPI:   The patient is a 75 year old mildly overweight married Caucasian male, father of 2 and grandfather of 4 grandchildren, whom I last saw in the office 12/09/14.[name] He has a history of CAD, status post LAD stenting by me using bare-metal stent April 16, 1996. He had normal LV function at that time with normal renal arteries as well. His other problems include essential hypertension, hyperlipidemia. He had a normal Myoview February 2011 and was scheduled to have elective robotic prostatectomy for prostate cancer by Dr. Crecencio Acevedo. He developed exertional chest pain similar to his pre-stent symptoms, which were fairly reproducible and because of this, and despite a negative Myoview, I elected to recatheterize him June 17, 2009, revealing a patent proximal LAD stent with 90% stenosis just beyond this, which I then stented with a Multi-Link Vision bare-metal stent with an excellent result. Since I saw him a year ago he's been completely asymptomatic. He did have a Myoview stress test performed 03/31/15 which is intolerant with normal . This was performed for preoperative clearance before a right total hip replacement on 04/19/15 which was uneventful and when she has recovered from.    Current Outpatient Prescriptions  Medication Sig Dispense Refill  . amLODipine (NORVASC) 10 MG tablet TAKE ONE-HALF TABLET BY MOUTH ONCE DAILY 15 tablet 8  . aspirin EC 325 MG tablet Take 325 mg by mouth daily.    . Choline Fenofibrate (FENOFIBRIC ACID) 45 MG CPDR TAKE ONE CAPSULE BY MOUTH ONCE DAILY 30 capsule 8  . clopidogrel (PLAVIX) 75 MG tablet TAKE ONE TABLET BY MOUTH ONCE DAILY 30 tablet 10  . lisinopril (PRINIVIL,ZESTRIL) 40 MG tablet TAKE ONE TABLET BY MOUTH ONCE DAILY 90 tablet 2  . metoprolol tartrate (LOPRESSOR) 25 MG tablet TAKE ONE TABLET BY MOUTH  ONCE DAILY 90 tablet 2  . Omega-3 Fatty Acids (FISH OIL) 1000 MG CAPS Take 1 capsule by mouth every other day.    . pravastatin (PRAVACHOL) 40 MG tablet TAKE ONE TABLET BY MOUTH ONCE DAILY 90 tablet 3  . spironolactone (ALDACTONE) 25 MG tablet Take 1 tablet (25 mg total) by mouth daily. 30 tablet 9  . tiZANidine (ZANAFLEX) 2 MG tablet Take 1 tablet (2 mg total) by mouth every 8 (eight) hours as needed for muscle spasms. 50 tablet 0  . valsartan-hydrochlorothiazide (DIOVAN-HCT) 160-25 MG tablet TAKE ONE TABLET BY MOUTH ONCE DAILY 30 tablet 8   No current facility-administered medications for this visit.     Allergies  Allergen Reactions  . Adhesive [Tape] Other (See Comments)    Mainly just redness.   Has been ruled out for latex allegery  . Vantin [Cefpodoxime] Rash    Social History   Social History  . Marital status: Married    Spouse name: N/A  . Number of children: N/A  . Years of education: N/A   Occupational History  . Not on file.   Social History Main Topics  . Smoking status: Never Smoker  . Smokeless tobacco: Never Used  . Alcohol use No  . Drug use: No  . Sexual activity: Not on file   Other Topics Concern  . Not on file   Social History Narrative  . No narrative on file     Review of Systems: General: negative for chills, fever, night sweats or weight changes.  Cardiovascular: negative  for chest pain, dyspnea on exertion, edema, orthopnea, palpitations, paroxysmal nocturnal dyspnea or shortness of breath Dermatological: negative for rash Respiratory: negative for cough or wheezing Urologic: negative for hematuria Abdominal: negative for nausea, vomiting, diarrhea, bright red blood per rectum, melena, or hematemesis Neurologic: negative for visual changes, syncope, or dizziness All other systems reviewed and are otherwise negative except as noted above.    Blood pressure 126/72, pulse (!) 46, height 6\' 1"  (1.854 m), weight 203 lb (92.1 kg).  General  appearance: alert and no distress Neck: no adenopathy, no carotid bruit, no JVD, supple, symmetrical, trachea midline and thyroid not enlarged, symmetric, no tenderness/mass/nodules Lungs: clear to auscultation bilaterally Heart: regular rate and rhythm, S1, S2 normal, no murmur, click, rub or gallop Extremities: extremities normal, atraumatic, no cyanosis or edema  EKG sinus bradycardia at 46 with nonspecific ST and T-wave changes. I personally reviewed this EKG  ASSESSMENT AND PLAN:   Essential hypertension History of hypertension blood pressure measured today at 126/72. He is on amlodipine, lisinopril and spironolactone as well as metoprolol. Continue current meds at current dosing  Hyperlipidemia History of hyperlipidemia on statin therapy with recent lipid profile performed by his PCP 12/08/15 revealed a total cholesterol 145, LDL 86 and HDL of 39.  Coronary artery disease History of CAD status post LAD stenting by myself using bare-metal stent 04/16/96. He had a normal Myoview February 2011 and was scheduled to have robotic prostatectomy because of prostate cancer by Dr. Laverle Acevedo. He was complaining of exertional chest pain similar to his pre-stent symptoms and because of this and despite a negative Myoview I recatheterized him 06/17/09 revealing a patent proximal LAD stent with 90% stenosis just beyond this which I stented using a MultiLink vision bare metal stent with excellent result. He denies chest pain or shortness of breath. The Myoview performed earlier this year 03/31/15 for preoperative clearance before right total hip replacement was normal.      Jeffrey Gess MD [facility], [facility] 12/14/2015 12:43 PM

## 2015-12-14 NOTE — Patient Instructions (Signed)
Medication Instructions:  NO CHANGES.   Follow-Up: Your physician wants you to follow-up in: 12 MONTHS WITH DR BERRY.  You will receive a reminder letter in the mail two months in advance. If you don't receive a letter, please call our office to schedule the follow-up appointment.   If you need a refill on your cardiac medications before your next appointment, please call your pharmacy.   

## 2016-01-11 ENCOUNTER — Other Ambulatory Visit: Payer: Self-pay | Admitting: Cardiovascular Disease

## 2016-01-31 ENCOUNTER — Other Ambulatory Visit: Payer: Self-pay | Admitting: Cardiovascular Disease

## 2016-01-31 NOTE — Telephone Encounter (Signed)
REFILL 

## 2016-02-11 ENCOUNTER — Other Ambulatory Visit: Payer: Self-pay | Admitting: Cardiovascular Disease

## 2016-02-14 NOTE — Telephone Encounter (Signed)
Rx has been sent to the pharmacy electronically. ° °

## 2016-03-08 ENCOUNTER — Other Ambulatory Visit: Payer: Self-pay | Admitting: Cardiovascular Disease

## 2016-04-05 ENCOUNTER — Other Ambulatory Visit: Payer: Self-pay | Admitting: Cardiovascular Disease

## 2016-04-07 NOTE — Telephone Encounter (Signed)
Rx has been sent to the pharmacy electronically. ° °

## 2016-05-21 ENCOUNTER — Other Ambulatory Visit: Payer: Self-pay | Admitting: Cardiovascular Disease

## 2016-05-22 NOTE — Telephone Encounter (Signed)
Rx has been sent to the pharmacy electronically. ° °

## 2016-07-21 ENCOUNTER — Other Ambulatory Visit: Payer: Self-pay | Admitting: Cardiovascular Disease

## 2016-08-15 ENCOUNTER — Other Ambulatory Visit: Payer: Self-pay | Admitting: Cardiovascular Disease

## 2016-10-26 ENCOUNTER — Other Ambulatory Visit: Payer: Self-pay | Admitting: Pharmacist Clinician (PhC)/ Clinical Pharmacy Specialist

## 2016-10-26 MED ORDER — IRBESARTAN-HYDROCHLOROTHIAZIDE 150-12.5 MG PO TABS: 1.0000 | ORAL_TABLET | Freq: Every day | ORAL | 1 refills | Status: DC

## 2016-10-26 NOTE — Telephone Encounter (Signed)
Was on valsartan hctz 160/25 mg daliy.  Recalled.  Will give patient irbesartan hctz 150/12.5 mg.  Explained the slight difference in dose to patient.  He will check home BP qd-qod for 2-3 weeks and call if any concerns

## 2016-12-20 ENCOUNTER — Ambulatory Visit: Payer: Medicare Other | Admitting: Cardiovascular Disease

## 2016-12-20 ENCOUNTER — Other Ambulatory Visit: Payer: Self-pay | Admitting: Cardiovascular Disease

## 2017-01-05 ENCOUNTER — Ambulatory Visit (INDEPENDENT_AMBULATORY_CARE_PROVIDER_SITE_OTHER): Payer: Medicare Other | Admitting: Cardiovascular Disease

## 2017-01-05 ENCOUNTER — Encounter: Payer: Self-pay | Admitting: Cardiovascular Disease

## 2017-01-05 VITALS — BP 158/86 | HR 46 | Ht 73.0 in | Wt 208.0 lb

## 2017-01-05 DIAGNOSIS — E785 Hyperlipidemia, unspecified: Secondary | ICD-10-CM | POA: Diagnosis not present

## 2017-01-05 DIAGNOSIS — I10 Essential (primary) hypertension: Secondary | ICD-10-CM

## 2017-01-05 LAB — LIPID PANEL
Chol/HDL Ratio: 4.3 ratio (ref 0.0–5.0)
Cholesterol, Total: 147 mg/dL (ref 100–199)
HDL: 34 mg/dL — AB (ref >39)
LDL Calculated: 61 mg/dL (ref 0–99)
TRIGLYCERIDES: 259 mg/dL — AB (ref 0–149)
VLDL Cholesterol Cal: 52 mg/dL — ABNORMAL HIGH (ref 5–40)

## 2017-01-05 LAB — HEPATIC FUNCTION PANEL
ALK PHOS: 62 IU/L (ref 39–117)
ALT: 20 IU/L (ref 0–44)
AST: 23 IU/L (ref 0–40)
Albumin: 4.7 g/dL (ref 3.5–4.8)
BILIRUBIN, DIRECT: 0.28 mg/dL (ref 0.00–0.40)
Bilirubin Total: 1.2 mg/dL (ref 0.0–1.2)
TOTAL PROTEIN: 7 g/dL (ref 6.0–8.5)

## 2017-01-05 MED ORDER — IRBESARTAN-HYDROCHLOROTHIAZIDE 300-12.5 MG PO TABS: 1.0000 | ORAL_TABLET | Freq: Every day | ORAL | 6 refills | Status: DC

## 2017-01-05 NOTE — Assessment & Plan Note (Signed)
History of CAD status post LAD stenting by myself using a bare-metal stent 04/16/96. Because of recurrent symptoms and despite a negative Myoview I elected to re-catheterize him 06/17/09 revealing a patent proximal LAD stent the 90% stenosis just beyond this which I stented using a Multi-Link vision bare metal stent with excellent result. He remains asymptomatic. His last Myoview performed 03/31/15 was nonischemic.

## 2017-01-05 NOTE — Progress Notes (Signed)
01/05/2017 BRIG KATZER   10/05/1940  [id number]  Primary Physician [name], MD Primary Cardiologist: Runell Gess MD FACP, [place], [place], [place]  HPI:  Jeffrey Acevedo is a 76 y.o. male mildly overweight married Caucasian male, father of 2 and grandfather of 4 grandchildren, whom I last saw in the office 12/14/15.[name] He has a history of CAD, status post LAD stenting by me using bare-metal stent April 16, 1996. He had normal LV function at that time with normal renal arteries as well. His other problems include essential hypertension, hyperlipidemia. He had a normal Myoview February 2011 and was scheduled to have elective robotic prostatectomy for prostate cancer by Dr. Crecencio Mc. He developed exertional chest pain similar to his pre-stent symptoms, which were fairly reproducible and because of this, and despite a negative Myoview, I elected to recatheterize him June 17, 2009, revealing a patent proximal LAD stent with 90% stenosis just beyond this, which I then stented with a Multi-Link Vision bare-metal stent with an excellent result. Since I saw him a year ago he's been completely asymptomatic. He did have a Myoview stress test performed 03/31/15 which is intolerant with normal . This was performed for preoperative clearance before a right total hip replacement on 04/19/15 which was uneventful and when she has recovered from.   Current Meds  Medication Sig  . amLODipine (NORVASC) 10 MG tablet TAKE ONE-HALF TABLET BY MOUTH ONCE DAILY  . aspirin EC 325 MG tablet Take 325 mg by mouth daily.  . Choline Fenofibrate (FENOFIBRIC ACID) 45 MG CPDR Take 1 capsule by mouth daily.  . clopidogrel (PLAVIX) 75 MG tablet TAKE ONE TABLET BY MOUTH ONCE DAILY  . metoprolol tartrate (LOPRESSOR) 25 MG tablet TAKE ONE TABLET BY MOUTH ONCE DAILY  . Omega-3 Fatty Acids (FISH OIL) 1000 MG CAPS Take 1 capsule by mouth every other day.  . pravastatin (PRAVACHOL) 40 MG tablet TAKE 1 TABLET BY MOUTH ONCE  DAILY  . spironolactone (ALDACTONE) 25 MG tablet TAKE ONE TABLET BY MOUTH ONCE DAILY  . [DISCONTINUED] irbesartan-hydrochlorothiazide (AVALIDE) 150-12.5 MG tablet Take 1 tablet by mouth daily.  . [DISCONTINUED] lisinopril (PRINIVIL,ZESTRIL) 40 MG tablet TAKE ONE TABLET BY MOUTH ONCE DAILY     Allergies  Allergen Reactions  . Adhesive [Tape] Other (See Comments)    Mainly just redness.   Has been ruled out for latex allegery  . Hydrochlorothiazide Other (See Comments)    Pancreatitis  . Latex Other (See Comments)    unknown  . Vantin [Cefpodoxime] Rash    Social History   Social History  . Marital status: Married    Spouse name: N/A  . Number of children: N/A  . Years of education: N/A   Occupational History  . Not on file.   Social History Main Topics  . Smoking status: Never Smoker  . Smokeless tobacco: Never Used  . Alcohol use No  . Drug use: No  . Sexual activity: Not on file   Other Topics Concern  . Not on file   Social History Narrative  . No narrative on file     Review of Systems: General: negative for chills, fever, night sweats or weight changes.  Cardiovascular: negative for chest pain, dyspnea on exertion, edema, orthopnea, palpitations, paroxysmal nocturnal dyspnea or shortness of breath Dermatological: negative for rash Respiratory: negative for cough or wheezing Urologic: negative for hematuria Abdominal: negative for nausea, vomiting, diarrhea, bright red blood per rectum, melena, or hematemesis Neurologic: negative  for visual changes, syncope, or dizziness All other systems reviewed and are otherwise negative except as noted above.    Blood pressure (!) 158/86, pulse (!) 46, height 6\' 1"  (1.854 m), weight 208 lb (94.3 kg).  General appearance: alert and no distress Neck: no adenopathy, no carotid bruit, no JVD, supple, symmetrical, trachea midline and thyroid not enlarged, symmetric, no tenderness/mass/nodules Lungs: clear to auscultation  bilaterally Heart: regular rate and rhythm, S1, S2 normal, no murmur, click, rub or gallop Extremities: extremities normal, atraumatic, no cyanosis or edema Pulses: 2+ and symmetric Skin: Skin color, texture, turgor normal. No rashes or lesions Neurologic: Alert and oriented X 3, normal strength and tone. Normal symmetric reflexes. Normal coordination and gait  EKG sinus bradycardia at 46 without ST or T-wave changes. I Personally reviewed this EKG.  ASSESSMENT AND PLAN:   Essential hypertension History of essential hypertension blood pressure measured at 158/86. He is on irbesartan, hydrochlorothiazide, lisinopril, amlodipine and metoprolol as well as spironolactone. Continue current meds at current dosing  Hyperlipidemia History of hyperlipidemia on statin therapy. We will check a lipid and liver profile today.  Coronary artery disease History of CAD status post LAD stenting by myself using a bare-metal stent 04/16/96. Because of recurrent symptoms and despite a negative Myoview I elected to re-catheterize him 06/17/09 revealing a patent proximal LAD stent the 90% stenosis just beyond this which I stented using a Multi-Link vision bare metal stent with excellent result. He remains asymptomatic. His last Myoview performed 03/31/15 was nonischemic.      Runell Gess MD FACP,FACC,FAHA, [facility] 01/05/2017 11:21 AM

## 2017-01-05 NOTE — Patient Instructions (Addendum)
Medication Instructions: Your physician recommends that you continue on your current medications as directed. Please refer to the Current Medication list given to you today.  STOP Lisinopril  Increase Irbesartan-HCTZ to 300-12.5 mg daily.   Labwork: Your physician recommends that you return for a FASTING lipid profile and hepatic function panel.   Follow-Up: Your physician wants you to follow-up in: 1 year with Dr. Gwenlyn Found. You will receive a reminder letter in the mail two months in advance. If you don't receive a letter, please call our office to schedule the follow-up appointment.  If you need a refill on your cardiac medications before your next appointment, please call your pharmacy.

## 2017-01-05 NOTE — Assessment & Plan Note (Signed)
History of essential hypertension blood pressure measured at 158/86. He is on irbesartan, hydrochlorothiazide, lisinopril, amlodipine and metoprolol as well as spironolactone. Continue current meds at current dosing

## 2017-01-05 NOTE — Assessment & Plan Note (Signed)
History of hyperlipidemia on statin therapy. We will check a lipid and liver profile today.

## 2017-01-11 ENCOUNTER — Other Ambulatory Visit: Payer: Self-pay | Admitting: Cardiovascular Disease

## 2017-01-11 ENCOUNTER — Encounter: Payer: Self-pay | Admitting: Cardiovascular Disease

## 2017-02-23 ENCOUNTER — Other Ambulatory Visit: Payer: Self-pay | Admitting: Cardiovascular Disease

## 2017-02-23 ENCOUNTER — Other Ambulatory Visit: Payer: Self-pay | Admitting: Cardiology

## 2017-04-05 ENCOUNTER — Other Ambulatory Visit: Payer: Self-pay | Admitting: Cardiovascular Disease

## 2017-06-13 ENCOUNTER — Other Ambulatory Visit: Payer: Self-pay | Admitting: *Deleted

## 2017-06-13 MED ORDER — IRBESARTAN-HYDROCHLOROTHIAZIDE 300-12.5 MG PO TABS: 1.0000 | ORAL_TABLET | Freq: Every day | ORAL | 1 refills | Status: DC

## 2017-07-06 ENCOUNTER — Other Ambulatory Visit: Payer: Self-pay | Admitting: Cardiovascular Disease

## 2017-07-06 NOTE — Telephone Encounter (Signed)
REFILL 

## 2017-09-14 ENCOUNTER — Other Ambulatory Visit: Payer: Self-pay | Admitting: Cardiovascular Disease

## 2017-11-11 ENCOUNTER — Other Ambulatory Visit: Payer: Self-pay | Admitting: Cardiovascular Disease

## 2017-11-23 IMAGING — RF DG HIP (WITH PELVIS) OPERATIVE*R*
1 series · 2 of 2 positions shown · non-contrast
Comparison: None.

CLINICAL DATA: Surgery: Right AA Hip Fluoro Time: 18 Sec

EXAM:
OPERATIVE RIGHT HIP (WITH PELVIS IF PERFORMED) to VIEWS
TECHNIQUE: Fluoroscopic spot image(s) were submitted for interpretation
post-operatively.

[Series 1: run · 2 of 2 slices shown]
[im 1/2]
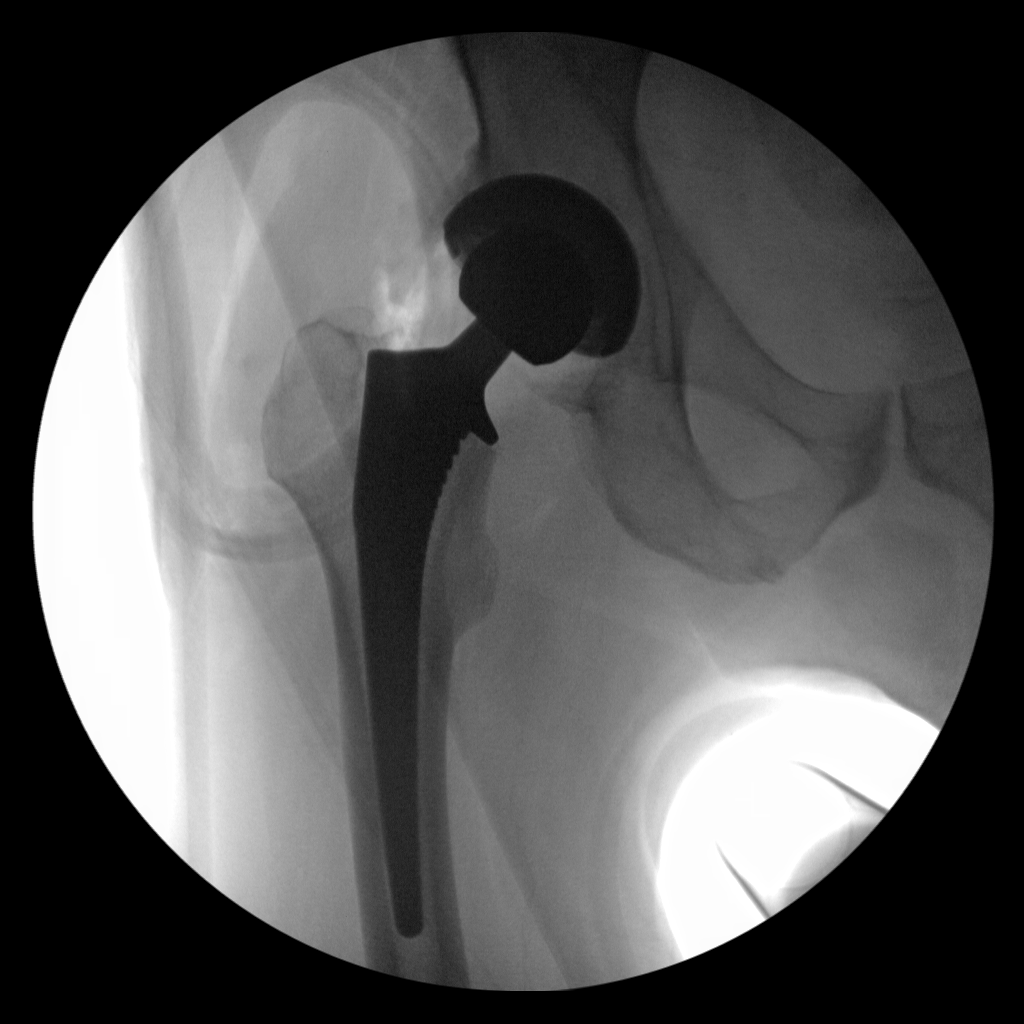
[im 2/2]
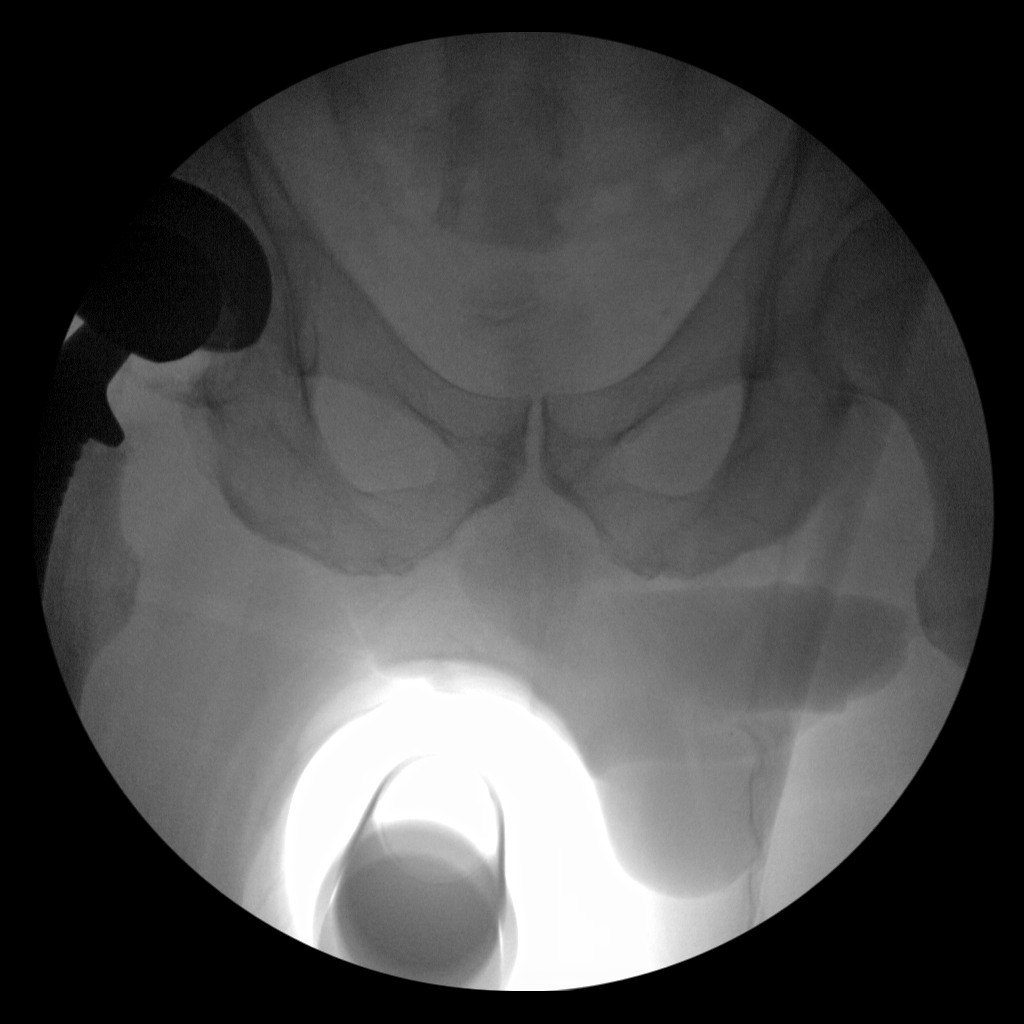

[2 of 2 positions shown; findings below may reference images not displayed]

FINDINGS: Frontal images are performed, demonstrating right hip arthroplasty.
There is no evidence for dislocation or interval fracture.
IMPRESSION: Status post right hip arthroplasty.  No adverse features.

## 2018-02-11 ENCOUNTER — Other Ambulatory Visit: Payer: Self-pay | Admitting: Cardiovascular Disease

## 2018-02-12 ENCOUNTER — Other Ambulatory Visit: Payer: Self-pay | Admitting: Cardiovascular Disease

## 2018-02-12 NOTE — Telephone Encounter (Signed)
Rx request sent to pharmacy.  

## 2018-04-15 ENCOUNTER — Other Ambulatory Visit: Payer: Self-pay | Admitting: Cardiovascular Disease

## 2018-05-03 ENCOUNTER — Ambulatory Visit: Payer: Medicare Other | Admitting: Cardiovascular Disease

## 2018-05-03 ENCOUNTER — Encounter: Payer: Self-pay | Admitting: Cardiovascular Disease

## 2018-05-03 DIAGNOSIS — I2510 Atherosclerotic heart disease of native coronary artery without angina pectoris: Secondary | ICD-10-CM

## 2018-05-03 DIAGNOSIS — I2583 Coronary atherosclerosis due to lipid rich plaque: Secondary | ICD-10-CM | POA: Diagnosis not present

## 2018-05-03 DIAGNOSIS — I10 Essential (primary) hypertension: Secondary | ICD-10-CM | POA: Diagnosis not present

## 2018-05-03 DIAGNOSIS — E782 Mixed hyperlipidemia: Secondary | ICD-10-CM | POA: Diagnosis not present

## 2018-05-03 NOTE — Patient Instructions (Signed)
Medication Instructions:  Your physician recommends that you continue on your current medications as directed. Please refer to the Current Medication list given to you today.  If you need a refill on your cardiac medications before your next appointment, please call your pharmacy.   Lab work: NONE If you have labs (blood work) drawn today and your tests are completely normal, you will receive your results only by: . MyChart Message (if you have MyChart) OR . A paper copy in the mail If you have any lab test that is abnormal or we need to change your treatment, we will call you to review the results.  Testing/Procedures: NONE  Follow-Up: At [facility], you and your health needs are our priority.  As part of our continuing mission to provide you with exceptional heart care, we have created designated Provider Care Teams.  These Care Teams include your primary Cardiologist (physician) and Advanced Practice Providers (APPs -  Physician Assistants and Nurse Practitioners) who all work together to provide you with the care you need, when you need it. . You will need a follow up appointment in 12 months.  Please call our office 2 months in advance to schedule this appointment.  You may see Dr. Berry or one of the following Advanced Practice Providers on your designated Care Team:   . Luke Kilroy, PA-C . Hao Meng, PA-C . Angela Duke, PA-C . Kathryn Lawrence, DNP . Rhonda Barrett, PA-C . Krista Kroeger, PA-C . Callie Goodrich, PA-C     

## 2018-05-03 NOTE — Assessment & Plan Note (Signed)
History of CAD status post LAD bare-metal stenting by myself 04/16/1996.  He had normal LV function and normal renal arteries at that time.  I recatheterized him because of symptoms despite a negative Myoview 06/17/2009 revealing a patent proximal LAD stent with a 90% stenosis just beyond this which I restented with a MultiLink vision bare-metal stent with an excellent result.  His last Myoview performed 03/31/2015 was low risk.  He denies chest pain or shortness of breath.

## 2018-05-03 NOTE — Assessment & Plan Note (Signed)
History of hyperlipidemia on fenofibrate and pravastatin with recent lipid profile performed by his PCP revealing a total cholesterol of 144, LDL of 73, HDL 38 and triglyceride level of 219.

## 2018-05-03 NOTE — Progress Notes (Signed)
05/03/2018 Jeffrey Acevedo   1941-03-13  [id number]  Primary Physician [name], MD Primary Cardiologist: Runell Gess MD FACP, [place], [place], [place]  HPI:  Jeffrey Acevedo is a 78 y.o.  overweight married Caucasian male, father of 2 and grandfather of 4 grandchildren, whom I last saw in the office  01/05/2017.[name] He has a history of CAD, status post LAD stenting by me using bare-metal stent April 16, 1996. He had normal LV function at that time with normal renal arteries as well. His other problems include essential hypertension, hyperlipidemia. He had a normal Myoview February 2011 and was scheduled to have elective robotic prostatectomy for prostate cancer by Dr. Crecencio Mc. He developed exertional chest pain similar to his pre-stent symptoms, which were fairly reproducible and because of this, and despite a negative Myoview, I elected to recatheterize him June 17, 2009, revealing a patent proximal LAD stent with 90% stenosis just beyond this, which I then stented with a Multi-Link Vision bare-metal stent with an excellent result. Since I saw him a year ago he's been completely asymptomatic. He did have a Myoview stress test performed 03/31/15 which is intolerant with normal . This was performed for preoperative clearance before a right total hip replacement on 04/19/15 which was uneventful.  Since I saw him a year and a half ago he is done well.  He denies chest pain or shortness of breath.  Blood pressure was inching up slightly and his amlodipine was doubled by his PCP.  His lipid profile is excellent.  Current Meds  Medication Sig  . amLODipine (NORVASC) 10 MG tablet Take 0.5 tablets (5 mg total) by mouth daily. Please schedule appointment for refills.  [name] aspirin EC 325 MG tablet Take 325 mg by mouth daily.  . Choline Fenofibrate (FENOFIBRIC ACID) 45 MG CPDR TAKE 1 CAPSULE BY MOUTH ONCE DAILY (Patient taking differently: PATIENT takes every other day)  . clopidogrel (PLAVIX) 75 MG  tablet TAKE ONE TABLET BY MOUTH ONCE DAILY  . irbesartan-hydrochlorothiazide (AVALIDE) 300-12.5 MG tablet TAKE 1 TABLET BY MOUTH ONCE DAILY  . metoprolol tartrate (LOPRESSOR) 25 MG tablet TAKE ONE TABLET BY MOUTH ONCE DAILY  . Omega-3 Fatty Acids (FISH OIL) 1000 MG CAPS Take 1 capsule by mouth every other day.  . pravastatin (PRAVACHOL) 40 MG tablet TAKE 1 TABLET BY MOUTH ONCE DAILY  . spironolactone (ALDACTONE) 25 MG tablet TAKE 1 TABLET BY MOUTH ONCE DAILY     Allergies  Allergen Reactions  . Adhesive [Tape] Other (See Comments)    Mainly just redness.   Has been ruled out for latex allegery  . Hydrochlorothiazide Other (See Comments)    Pancreatitis  . Latex Other (See Comments)    unknown  . Vantin [Cefpodoxime] Rash    Social History   Socioeconomic History  . Marital status: Married    Spouse name: Not on file  . Number of children: Not on file  . Years of education: Not on file  . Highest education level: Not on file  Occupational History  . Not on file  Social Needs  . Financial resource strain: Not on file  . Food insecurity:    Worry: Not on file    Inability: Not on file  . Transportation needs:    Medical: Not on file    Non-medical: Not on file  Tobacco Use  . Smoking status: Never Smoker  . Smokeless tobacco: Never Used  Substance and Sexual Activity  . Alcohol  use: No  . Drug use: No  . Sexual activity: Not on file  Lifestyle  . Physical activity:    Days per week: Not on file    Minutes per session: Not on file  . Stress: Not on file  Relationships  . Social connections:    Talks on phone: Not on file    Gets together: Not on file    Attends religious service: Not on file    Active member of club or organization: Not on file    Attends meetings of clubs or organizations: Not on file    Relationship status: Not on file  . Intimate partner violence:    Fear of current or ex partner: Not on file    Emotionally abused: Not on file    Physically  abused: Not on file    Forced sexual activity: Not on file  Other Topics Concern  . Not on file  Social History Narrative  . Not on file     Review of Systems: General: negative for chills, fever, night sweats or weight changes.  Cardiovascular: negative for chest pain, dyspnea on exertion, edema, orthopnea, palpitations, paroxysmal nocturnal dyspnea or shortness of breath Dermatological: negative for rash Respiratory: negative for cough or wheezing Urologic: negative for hematuria Abdominal: negative for nausea, vomiting, diarrhea, bright red blood per rectum, melena, or hematemesis Neurologic: negative for visual changes, syncope, or dizziness All other systems reviewed and are otherwise negative except as noted above.    Blood pressure (!) 150/94, pulse (!) 59, height 5' 11.75" (1.822 m), weight 204 lb (92.5 kg).  General appearance: alert and no distress Neck: no adenopathy, no carotid bruit, no JVD, supple, symmetrical, trachea midline and thyroid not enlarged, symmetric, no tenderness/mass/nodules Lungs: clear to auscultation bilaterally Heart: regular rate and rhythm, S1, S2 normal, no murmur, click, rub or gallop Extremities: extremities normal, atraumatic, no cyanosis or edema Pulses: 2+ and symmetric Skin: Skin color, texture, turgor normal. No rashes or lesions Neurologic: Alert and oriented X 3, normal strength and tone. Normal symmetric reflexes. Normal coordination and gait  EKG sinus bradycardia 59 without ST or T wave changes.  Personally reviewed this EKG.  ASSESSMENT AND PLAN:   Essential hypertension 3 of essential hypertension with blood pressure measured today at 150/94.  I reviewed his blood pressure log which for the most part shows blood pressure is much better than his.  He is on Norvasc 10 mg a day which was recently increased from 5 by his PCP along with Avalide 300/12.5 and metoprolol 25 mg a day as well as spironolactone.  Hyperlipidemia History of  hyperlipidemia on fenofibrate and pravastatin with recent lipid profile performed by his PCP revealing a total cholesterol of 144, LDL of 73, HDL 38 and triglyceride level of 219.  Coronary artery disease History of CAD status post LAD bare-metal stenting by myself 04/16/1996.  He had normal LV function and normal renal arteries at that time.  I recatheterized him because of symptoms despite a negative Myoview 06/17/2009 revealing a patent proximal LAD stent with a 90% stenosis just beyond this which I restented with a MultiLink vision bare-metal stent with an excellent result.  His last Myoview performed 03/31/2015 was low risk.  He denies chest pain or shortness of breath.      Runell Gess MD FACP,FACC,FAHA, [facility] 05/03/2018 11:46 AM

## 2018-05-03 NOTE — Assessment & Plan Note (Signed)
3 of essential hypertension with blood pressure measured today at 150/94.  I reviewed his blood pressure log which for the most part shows blood pressure is much better than his.  He is on Norvasc 10 mg a day which was recently increased from 5 by his PCP along with Avalide 300/12.5 and metoprolol 25 mg a day as well as spironolactone.

## 2018-05-16 ENCOUNTER — Other Ambulatory Visit: Payer: Self-pay | Admitting: Cardiovascular Disease

## 2018-05-16 NOTE — Telephone Encounter (Signed)
Rx(s) sent to pharmacy electronically.  

## 2018-05-20 ENCOUNTER — Other Ambulatory Visit: Payer: Self-pay

## 2018-05-20 MED ORDER — IRBESARTAN-HYDROCHLOROTHIAZIDE 300-12.5 MG PO TABS: 1.0000 | ORAL_TABLET | Freq: Every day | ORAL | 3 refills | Status: DC

## 2018-05-20 NOTE — Telephone Encounter (Signed)
Rx(s) sent to pharmacy electronically.  

## 2018-06-04 ENCOUNTER — Other Ambulatory Visit: Payer: Self-pay | Admitting: Cardiovascular Disease

## 2018-07-23 ENCOUNTER — Other Ambulatory Visit: Payer: Self-pay | Admitting: Cardiovascular Disease

## 2019-06-25 ENCOUNTER — Other Ambulatory Visit: Payer: Self-pay | Admitting: Cardiovascular Disease

## 2019-06-25 NOTE — Telephone Encounter (Signed)
Rx request sent to pharmacy.  

## 2019-07-02 ENCOUNTER — Other Ambulatory Visit: Payer: Self-pay | Admitting: Cardiovascular Disease

## 2019-07-09 ENCOUNTER — Other Ambulatory Visit: Payer: Self-pay | Admitting: Cardiovascular Disease

## 2019-07-10 NOTE — Telephone Encounter (Signed)
Rx(s) sent to pharmacy electronically.  

## 2019-08-07 ENCOUNTER — Other Ambulatory Visit: Payer: Self-pay | Admitting: Cardiovascular Disease

## 2019-08-16 ENCOUNTER — Other Ambulatory Visit: Payer: Self-pay | Admitting: Cardiovascular Disease

## 2019-08-20 ENCOUNTER — Encounter: Payer: Self-pay | Admitting: Cardiovascular Disease

## 2019-08-20 ENCOUNTER — Other Ambulatory Visit: Payer: Self-pay

## 2019-08-20 ENCOUNTER — Ambulatory Visit (INDEPENDENT_AMBULATORY_CARE_PROVIDER_SITE_OTHER): Payer: Medicare PPO | Admitting: Cardiovascular Disease

## 2019-08-20 VITALS — BP 150/86 | HR 60 | Ht 72.0 in | Wt 206.0 lb

## 2019-08-20 DIAGNOSIS — E782 Mixed hyperlipidemia: Secondary | ICD-10-CM | POA: Diagnosis not present

## 2019-08-20 DIAGNOSIS — I2510 Atherosclerotic heart disease of native coronary artery without angina pectoris: Secondary | ICD-10-CM

## 2019-08-20 DIAGNOSIS — I10 Essential (primary) hypertension: Secondary | ICD-10-CM | POA: Diagnosis not present

## 2019-08-20 DIAGNOSIS — I2583 Coronary atherosclerosis due to lipid rich plaque: Secondary | ICD-10-CM | POA: Diagnosis not present

## 2019-08-20 LAB — LIPID PANEL
Chol/HDL Ratio: 3.7 ratio (ref 0.0–5.0)
Cholesterol, Total: 138 mg/dL (ref 100–199)
HDL: 37 mg/dL — ABNORMAL LOW (ref >39)
LDL Chol Calc (NIH): 77 mg/dL (ref 0–99)
Triglycerides: 133 mg/dL (ref 0–149)
VLDL Cholesterol Cal: 24 mg/dL (ref 5–40)

## 2019-08-20 LAB — HEPATIC FUNCTION PANEL
ALT: 16 IU/L (ref 0–44)
AST: 15 IU/L (ref 0–40)
Albumin: 4.5 g/dL (ref 3.7–4.7)
Alkaline Phosphatase: 62 IU/L (ref 48–121)
Bilirubin Total: 0.6 mg/dL (ref 0.0–1.2)
Bilirubin, Direct: 0.2 mg/dL (ref 0.00–0.40)
Total Protein: 6.8 g/dL (ref 6.0–8.5)

## 2019-08-20 NOTE — Assessment & Plan Note (Signed)
History of essential hypertension with blood pressure measured today 150/86.  He is on amlodipine, Avalide and Lopressor.

## 2019-08-20 NOTE — Progress Notes (Signed)
08/20/2019 Jeffrey Acevedo   08/30/1940  [id number]  Primary Physician [name], MD Primary Cardiologist: Runell Gess MD FACP, [place], [place], [place]  HPI:  Jeffrey Acevedo is a 79 y.o.  overweight married Caucasian male, father of 2 and grandfather of 4 grandchildren, whom I last saw in the office  05/03/2018.[name] He has a history of CAD, status post LAD stenting by me using bare-metal stent April 16, 1996. He had normal LV function at that time with normal renal arteries as well. His other problems include essential hypertension, hyperlipidemia. He had a normal Myoview February 2011 and was scheduled to have elective robotic prostatectomy for prostate cancer by Dr. Crecencio Mc. He developed exertional chest pain similar to his pre-stent symptoms, which were fairly reproducible and because of this, and despite a negative Myoview, I elected to recatheterize him June 17, 2009, revealing a patent proximal LAD stent with 90% stenosis just beyond this, which I then stented with a Multi-Link Vision bare-metal stent with an excellent result. Since I saw him a year ago he's been completely asymptomatic. He did have a Myoview stress test performed 03/31/15 which is intolerant with normal . This was performed for preoperative clearance before a right total hip replacement on 04/19/15 which was uneventful.  Since I saw him a year ago he continues to do well.  He is active.  He denies chest pain or shortness of breath.   Current Meds  Medication Sig  . amLODipine (NORVASC) 10 MG tablet TAKE 1/2 (ONE-HALF) TABLET BY MOUTH ONCE DAILY. PLEASE MAKE AN APPOINTMENT FOR REFILLS  . aspirin EC 325 MG tablet Take 325 mg by mouth daily.  . Choline Fenofibrate (FENOFIBRIC ACID) 45 MG CPDR Take 1 capsule by mouth once daily  . clopidogrel (PLAVIX) 75 MG tablet TAKE ONE TABLET BY MOUTH ONCE DAILY  . irbesartan-hydrochlorothiazide (AVALIDE) 300-12.5 MG tablet Take 1 tablet by mouth once daily  . metoprolol  tartrate (LOPRESSOR) 25 MG tablet Take 1 tablet by mouth once daily  . Omega-3 Fatty Acids (FISH OIL) 1000 MG CAPS Take 1 capsule by mouth every other day.  . pravastatin (PRAVACHOL) 40 MG tablet Take 1 tablet (40 mg total) by mouth daily.  [name] spironolactone (ALDACTONE) 25 MG tablet Take 1 tablet by mouth once daily     Allergies  Allergen Reactions  . Adhesive [Tape] Other (See Comments)    Mainly just redness.   Has been ruled out for latex allegery  . Hydrochlorothiazide Other (See Comments)    Pancreatitis  . Latex Other (See Comments)    unknown  . Vantin [Cefpodoxime] Rash    Social History   Socioeconomic History  . Marital status: Married    Spouse name: Not on file  . Number of children: Not on file  . Years of education: Not on file  . Highest education level: Not on file  Occupational History  . Not on file  Tobacco Use  . Smoking status: Never Smoker  . Smokeless tobacco: Never Used  Substance and Sexual Activity  . Alcohol use: No  . Drug use: No  . Sexual activity: Not on file  Other Topics Concern  . Not on file  Social History Narrative  . Not on file   Social Determinants of Health   Financial Resource Strain:   . Difficulty of Paying Living Expenses:   Food Insecurity:   . Worried About Programme researcher, broadcasting/film/video in the Last Year:   .  Ran Out of Food in the Last Year:   Transportation Needs:   . Freight forwarder (Medical):   [name] Lack of Transportation (Non-Medical):   Physical Activity:   . Days of Exercise per Week:   . Minutes of Exercise per Session:   Stress:   . Feeling of Stress :   Social Connections:   . Frequency of Communication with Friends and Family:   . Frequency of Social Gatherings with Friends and Family:   . Attends Religious Services:   . Active Member of Clubs or Organizations:   . Attends Banker Meetings:   [name] Marital Status:   Intimate Partner Violence:   . Fear of Current or Ex-Partner:   . Emotionally  Abused:   [name] Physically Abused:   . Sexually Abused:      Review of Systems: General: negative for chills, fever, night sweats or weight changes.  Cardiovascular: negative for chest pain, dyspnea on exertion, edema, orthopnea, palpitations, paroxysmal nocturnal dyspnea or shortness of breath Dermatological: negative for rash Respiratory: negative for cough or wheezing Urologic: negative for hematuria Abdominal: negative for nausea, vomiting, diarrhea, bright red blood per rectum, melena, or hematemesis Neurologic: negative for visual changes, syncope, or dizziness All other systems reviewed and are otherwise negative except as noted above.    Blood pressure (!) 150/86, pulse 60, height 6' (1.829 m), weight 206 lb (93.4 kg), SpO2 97 %.  General appearance: alert and no distress Neck: no adenopathy, no carotid bruit, no JVD, supple, symmetrical, trachea midline and thyroid not enlarged, symmetric, no tenderness/mass/nodules Lungs: clear to auscultation bilaterally Heart: regular rate and rhythm, S1, S2 normal, no murmur, click, rub or gallop Extremities: extremities normal, atraumatic, no cyanosis or edema Pulses: 2+ and symmetric Skin: Skin color, texture, turgor normal. No rashes or lesions Neurologic: Alert and oriented X 3, normal strength and tone. Normal symmetric reflexes. Normal coordination and gait  EKG sinus rhythm at 60 with nonspecific ST and T wave changes.  Personally reviewed this EKG.  ASSESSMENT AND PLAN:   Essential hypertension History of essential hypertension with blood pressure measured today 150/86.  He is on amlodipine, Avalide and Lopressor.  Hyperlipidemia History of hyperlipidemia on statin therapy.  We will recheck a lipid liver profile today.  Coronary artery disease History of CAD status post LAD stenting by myself using a bare-metal stent 04/16/1996 with normal LV function.  I recatheterized him 06/17/2009 revealing a patent proximal Melody stent with  a 90% stenosis beyond this which I stented using a MultiLink vision bare-metal stent with excellent result.  His last Myoview performed 03/31/2015 was nonischemic.  He denies chest pain or shortness of breath.      Runell Gess MD FACP,FACC,FAHA, [facility] 08/20/2019 10:01 AM

## 2019-08-20 NOTE — Assessment & Plan Note (Signed)
History of hyperlipidemia on statin therapy.  We will recheck a lipid liver profile today. 

## 2019-08-20 NOTE — Assessment & Plan Note (Signed)
History of CAD status post LAD stenting by myself using a bare-metal stent 04/16/1996 with normal LV function.  I recatheterized him 06/17/2009 revealing a patent proximal Melody stent with a 90% stenosis beyond this which I stented using a MultiLink vision bare-metal stent with excellent result.  His last Myoview performed 03/31/2015 was nonischemic.  He denies chest pain or shortness of breath.

## 2019-08-20 NOTE — Patient Instructions (Signed)
Medication Instructions:  Your physician recommends that you continue on your current medications as directed. Please refer to the Current Medication list given to you today.  *If you need a refill on your cardiac medications before your next appointment, please call your pharmacy*   Lab Work: FASTING lipid/liver to be completed today   If you have labs (blood work) drawn today and your tests are completely normal, you will receive your results only by:  [place] (if you have MyChart) OR  A paper copy in the mail If you have any lab test that is abnormal or we need to change your treatment, we will call you to review the results.   Testing/Procedures: NONE   Follow-Up: At [facility], you and your health needs are our priority.  As part of our continuing mission to provide you with exceptional heart care, we have created designated Provider Care Teams.  These Care Teams include your primary Cardiologist (physician) and Advanced Practice Providers (APPs -  Physician Assistants and Nurse Practitioners) who all work together to provide you with the care you need, when you need it.  We recommend signing up for the patient portal called "MyChart".  Sign up information is provided on this After Visit Summary.  MyChart is used to connect with patients for Virtual Visits (Telemedicine).  Patients are able to view lab/test results, encounter notes, upcoming appointments, etc.  Non-urgent messages can be sent to your provider as well.   To learn more about what you can do with MyChart, go to [email or web address].    Your next appointment:   12 month(s)  The format for your next appointment:   In Person  Provider:   You may see Dr. Gwenlyn Found or one of the following Advanced Practice Providers on your designated Care Team:    Kerin Ransom, PA-C  [place], [place]  Coletta Memos, [place]    Other Instructions

## 2019-08-25 ENCOUNTER — Other Ambulatory Visit: Payer: Self-pay | Admitting: Cardiovascular Disease

## 2019-08-29 ENCOUNTER — Other Ambulatory Visit: Payer: Self-pay | Admitting: Cardiovascular Disease

## 2019-09-18 ENCOUNTER — Other Ambulatory Visit: Payer: Self-pay | Admitting: Cardiovascular Disease

## 2019-10-01 ENCOUNTER — Other Ambulatory Visit: Payer: Self-pay | Admitting: Cardiovascular Disease

## 2019-12-31 ENCOUNTER — Other Ambulatory Visit: Payer: Self-pay | Admitting: Cardiovascular Disease

## 2019-12-31 NOTE — Telephone Encounter (Signed)
Rx request sent to pharmacy.  

## 2020-03-21 ENCOUNTER — Other Ambulatory Visit: Payer: Self-pay | Admitting: Cardiovascular Disease

## 2020-07-03 ENCOUNTER — Other Ambulatory Visit: Payer: Self-pay | Admitting: Cardiovascular Disease

## 2020-07-05 NOTE — Telephone Encounter (Signed)
Rx has been sent to the pharmacy electronically. ° °

## 2020-07-14 ENCOUNTER — Other Ambulatory Visit: Payer: Self-pay | Admitting: Cardiovascular Disease

## 2020-08-17 ENCOUNTER — Ambulatory Visit: Payer: Medicare PPO | Admitting: Cardiovascular Disease

## 2020-08-17 ENCOUNTER — Other Ambulatory Visit: Payer: Self-pay

## 2020-08-17 ENCOUNTER — Encounter: Payer: Self-pay | Admitting: Cardiovascular Disease

## 2020-08-17 VITALS — BP 150/92 | HR 56 | Ht 71.0 in | Wt 211.0 lb

## 2020-08-17 DIAGNOSIS — E782 Mixed hyperlipidemia: Secondary | ICD-10-CM | POA: Diagnosis not present

## 2020-08-17 DIAGNOSIS — I2583 Coronary atherosclerosis due to lipid rich plaque: Secondary | ICD-10-CM

## 2020-08-17 DIAGNOSIS — I10 Essential (primary) hypertension: Secondary | ICD-10-CM | POA: Diagnosis not present

## 2020-08-17 DIAGNOSIS — I2510 Atherosclerotic heart disease of native coronary artery without angina pectoris: Secondary | ICD-10-CM

## 2020-08-17 LAB — HEPATIC FUNCTION PANEL
ALT: 20 IU/L (ref 0–44)
AST: 19 IU/L (ref 0–40)
Albumin: 4.6 g/dL (ref 3.7–4.7)
Alkaline Phosphatase: 56 IU/L (ref 44–121)
Bilirubin Total: 1.2 mg/dL (ref 0.0–1.2)
Bilirubin, Direct: 0.3 mg/dL (ref 0.00–0.40)
Total Protein: 6.8 g/dL (ref 6.0–8.5)

## 2020-08-17 LAB — LIPID PANEL
Chol/HDL Ratio: 3.4 ratio (ref 0.0–5.0)
Cholesterol, Total: 124 mg/dL (ref 100–199)
HDL: 37 mg/dL — ABNORMAL LOW (ref >39)
LDL Chol Calc (NIH): 63 mg/dL (ref 0–99)
Triglycerides: 133 mg/dL (ref 0–149)
VLDL Cholesterol Cal: 24 mg/dL (ref 5–40)

## 2020-08-17 NOTE — Progress Notes (Signed)
08/17/2020 [name]   01-14-1941  [id number]  Primary Physician [name], MD Primary Cardiologist: Runell Gess MD FACP, [place], [place], [place]  HPI:  Jeffrey Acevedo is a 80 y.o.  overweight married Caucasian male, father of 2 and grandfather of 4 grandchildren, whom I last saw in the office 08/20/2019.[name] He has a history of CAD, status post LAD stenting by me using bare-metal stent April 16, 1996. He had normal LV function at that time with normal renal arteries as well. His other problems include essential hypertension, hyperlipidemia. He had a normal Myoview February 2011 and was scheduled to have elective robotic prostatectomy for prostate cancer by Dr. Crecencio Mc. He developed exertional chest pain similar to his pre-stent symptoms, which were fairly reproducible and because of this, and despite a negative Myoview, I elected to recatheterize him June 17, 2009, revealing a patent proximal LAD stent with 90% stenosis just beyond this, which I then stented with a Multi-Link Vision bare-metal stent with an excellent result. Since I saw him a year ago he's been completely asymptomatic. He did have a Myoview stress test performed 03/31/15 which was entirely normal.. This was performed for preoperative clearance before a right total hip replacement on 04/19/15 which was uneventful.  Since I saw him a year ago he continues to do well.  He is active.  He denies chest pain or shortness of breath.    Current Meds  Medication Sig  . amLODipine (NORVASC) 10 MG tablet Take 1 tablet (10 mg total) by mouth daily.  [name] aspirin EC 325 MG tablet Take 325 mg by mouth daily.  . Choline Fenofibrate (FENOFIBRIC ACID) 45 MG CPDR Take 1 capsule by mouth once daily  . clobetasol ointment (TEMOVATE) 0.05 % as needed.  . clopidogrel (PLAVIX) 75 MG tablet TAKE ONE TABLET BY MOUTH ONCE DAILY  . irbesartan-hydrochlorothiazide (AVALIDE) 300-12.5 MG tablet Take 1 tablet by mouth once daily  . metoprolol  tartrate (LOPRESSOR) 25 MG tablet Take 1 tablet by mouth once daily  . Omega-3 Fatty Acids (FISH OIL) 1000 MG CAPS Take 1 capsule by mouth every other day.  . pravastatin (PRAVACHOL) 40 MG tablet Take 1 tablet (40 mg total) by mouth daily.  [name] spironolactone (ALDACTONE) 25 MG tablet Take 1 tablet by mouth once daily     Allergies  Allergen Reactions  . Adhesive [Tape] Other (See Comments)    Mainly just redness.   Has been ruled out for latex allegery  . Hydrochlorothiazide Other (See Comments)    Pancreatitis  . Latex Other (See Comments)    unknown  . Vantin [Cefpodoxime] Rash    Social History   Socioeconomic History  . Marital status: Married    Spouse name: Not on file  . Number of children: Not on file  . Years of education: Not on file  . Highest education level: Not on file  Occupational History  . Not on file  Tobacco Use  . Smoking status: Never Smoker  . Smokeless tobacco: Never Used  Substance and Sexual Activity  . Alcohol use: No  . Drug use: No  . Sexual activity: Not on file  Other Topics Concern  . Not on file  Social History Narrative  . Not on file   Social Determinants of Health   Financial Resource Strain: Not on file  Food Insecurity: Not on file  Transportation Needs: Not on file  Physical Activity: Not on file  Stress: Not on file  Social Connections: Not on file  Intimate Partner Violence: Not on file     Review of Systems: General: negative for chills, fever, night sweats or weight changes.  Cardiovascular: negative for chest pain, dyspnea on exertion, edema, orthopnea, palpitations, paroxysmal nocturnal dyspnea or shortness of breath Dermatological: negative for rash Respiratory: negative for cough or wheezing Urologic: negative for hematuria Abdominal: negative for nausea, vomiting, diarrhea, bright red blood per rectum, melena, or hematemesis Neurologic: negative for visual changes, syncope, or dizziness All other systems reviewed  and are otherwise negative except as noted above.    Blood pressure (!) 150/92, pulse (!) 56, height 5\' 11"  (1.803 m), weight 211 lb (95.7 kg), SpO2 96 %.  General appearance: alert and no distress Neck: no adenopathy, no carotid bruit, no JVD, supple, symmetrical, trachea midline and thyroid not enlarged, symmetric, no tenderness/mass/nodules Lungs: clear to auscultation bilaterally Heart: regular rate and rhythm, S1, S2 normal, no murmur, click, rub or gallop Extremities: extremities normal, atraumatic, no cyanosis or edema Pulses: 2+ and symmetric Skin: Skin color, texture, turgor normal. No rashes or lesions Neurologic: Alert and oriented X 3, normal strength and tone. Normal symmetric reflexes. Normal coordination and gait  EKG sinus bradycardia 56 with first-degree AV block.  I personally reviewed this EKG.  ASSESSMENT AND PLAN:   Essential hypertension History of essential hypertension a blood pressure measured today 150/92.  He is on amlodipine, Avalide, metoprolol and spironolactone.  I did review blood pressure readings which he brought from home which were within normal range.  Hyperlipidemia History of hyperlipidemia on pravastatin with lipid profile performed 08/20/2019 revealing total cholesterol 138, LDL 77 and HDL 37.  I am going to repeat a lipid and liver profile.  Coronary artery disease History of CAD status post LAD stenting by myself using bare-metal stent 04/16/1996.  Normal LV function at that time.  He had recurrence of symptoms prior to a robotic prostatectomy in 2011.  Myoview performed February 2011 was essentially normal although because of similar chest pain to to his Jeffrey Acevedo stent symptoms I elected to recatheterized him 06/17/2009 revealing a patent LAD stent with 90% stenosis just beyond this which I restented using MultiLink vision bare-metal stent with excellent result.  He did have a Myoview performed 03/31/2015 which was entirely normal prior to elective right  total hip replacement.  He is totally asymptomatic.      Runell Gess MD FACP,FACC,FAHA, [facility] 08/17/2020 9:24 AM

## 2020-08-17 NOTE — Assessment & Plan Note (Signed)
History of essential hypertension a blood pressure measured today 150/92.  He is on amlodipine, Avalide, metoprolol and spironolactone.  I did review blood pressure readings which he brought from home which were within normal range.

## 2020-08-17 NOTE — Patient Instructions (Signed)

## 2020-08-17 NOTE — Assessment & Plan Note (Signed)
History of hyperlipidemia on pravastatin with lipid profile performed 08/20/2019 revealing total cholesterol 138, LDL 77 and HDL 37.  I am going to repeat a lipid and liver profile.

## 2020-08-17 NOTE — Assessment & Plan Note (Signed)
History of CAD status post LAD stenting by myself using bare-metal stent 04/16/1996.  Normal LV function at that time.  He had recurrence of symptoms prior to a robotic prostatectomy in 2011.  Myoview performed February 2011 was essentially normal although because of similar chest pain to to his Gwenlyn Found stent symptoms I elected to recatheterized him 06/17/2009 revealing a patent LAD stent with 90% stenosis just beyond this which I restented using MultiLink vision bare-metal stent with excellent result.  He did have a Myoview performed 03/31/2015 which was entirely normal prior to elective right total hip replacement.  He is totally asymptomatic.

## 2020-09-20 ENCOUNTER — Other Ambulatory Visit: Payer: Self-pay | Admitting: Cardiovascular Disease

## 2020-10-19 ENCOUNTER — Other Ambulatory Visit: Payer: Self-pay | Admitting: Cardiovascular Disease

## 2020-11-05 ENCOUNTER — Other Ambulatory Visit: Payer: Self-pay | Admitting: Cardiovascular Disease

## 2020-12-27 ENCOUNTER — Other Ambulatory Visit: Payer: Self-pay | Admitting: Cardiovascular Disease

## 2021-02-01 ENCOUNTER — Other Ambulatory Visit: Payer: Self-pay | Admitting: Cardiovascular Disease

## 2021-02-07 ENCOUNTER — Other Ambulatory Visit: Payer: Self-pay | Admitting: Cardiovascular Disease

## 2021-03-09 ENCOUNTER — Other Ambulatory Visit: Payer: Self-pay | Admitting: Cardiovascular Disease

## 2021-03-24 ENCOUNTER — Telehealth: Payer: Self-pay | Admitting: Cardiovascular Disease

## 2021-03-24 NOTE — Telephone Encounter (Signed)
Spoke to patient he stated for the past 3 days he has been having sob and a burning sensation in chest.He feels like heart flutters at times.No sob and no burning sensation in chest at present.Appointment scheduled with Dr.Berry 03/25/21 at 3:00 pm.Advised if symptoms worsen go to ED.

## 2021-03-24 NOTE — Telephone Encounter (Signed)
STAT if HR is under 50 or over 120 (normal HR is 60-100 beats per minute)  What is your heart rate? Low as 56 and as high as 98  He said he normal is in the 60's  Do you have a log of your heart rate readings (document readings)?   56  64  69  92  86  86   Do you have any other symptoms? SOB when he gets up and moves about, with a burning sensation in his chest.    Pt c/o of Chest Pain: STAT if CP now or developed within 24 hours  1. Are you having CP right now? no  2. Are you experiencing any other symptoms (ex. SOB, nausea, vomiting, sweating)? SOB  3. How long have you been experiencing CP? About 3 days  4. Is your CP continuous or coming and going? Comes and goes   5. Have you taken Nitroglycerin? No, does not have nitroglycerin pills.  ?

## 2021-03-25 ENCOUNTER — Other Ambulatory Visit: Payer: Self-pay

## 2021-03-25 ENCOUNTER — Ambulatory Visit: Payer: Medicare PPO | Admitting: Cardiovascular Disease

## 2021-03-25 ENCOUNTER — Encounter: Payer: Self-pay | Admitting: Cardiovascular Disease

## 2021-03-25 ENCOUNTER — Ambulatory Visit (INDEPENDENT_AMBULATORY_CARE_PROVIDER_SITE_OTHER): Payer: Medicare PPO

## 2021-03-25 VITALS — BP 118/88 | HR 67 | Ht 71.0 in | Wt 207.0 lb

## 2021-03-25 DIAGNOSIS — I2510 Atherosclerotic heart disease of native coronary artery without angina pectoris: Secondary | ICD-10-CM

## 2021-03-25 DIAGNOSIS — I10 Essential (primary) hypertension: Secondary | ICD-10-CM

## 2021-03-25 DIAGNOSIS — R002 Palpitations: Secondary | ICD-10-CM | POA: Diagnosis not present

## 2021-03-25 DIAGNOSIS — E782 Mixed hyperlipidemia: Secondary | ICD-10-CM | POA: Diagnosis not present

## 2021-03-25 DIAGNOSIS — I2583 Coronary atherosclerosis due to lipid rich plaque: Secondary | ICD-10-CM | POA: Diagnosis not present

## 2021-03-25 NOTE — Addendum Note (Signed)
Addended by: Beatrix Fetters on: 03/25/2021 03:31 PM   Modules accepted: Orders

## 2021-03-25 NOTE — Patient Instructions (Signed)
Medication Instructions:  Your physician recommends that you continue on your current medications as directed. Please refer to the Current Medication list given to you today.  *If you need a refill on your cardiac medications before your next appointment, please call your pharmacy*   Testing/Procedures: Your physician has requested that you have an echocardiogram. Echocardiography is a painless test that uses sound waves to create images of your heart. It provides your doctor with information about the size and shape of your heart and how well your hearts chambers and valves are working. This procedure takes approximately one hour. There are no restrictions for this procedure. This procedure is done at [address] physician has requested that you have a lexiscan myoview. For further information please visit [email or web address]. Please follow instruction sheet, as given. This will take place at [address], [address]  How to prepare for your Myocardial Perfusion Test: Do not eat or drink 3 hours prior to your test, except you may have water. Do not consume products containing caffeine (regular or decaffeinated) 12 hours prior to your test. (ex: coffee, chocolate, sodas, tea). Do bring a list of your current medications with you.  If not listed below, you may take your medications as normal. Do wear comfortable clothes (no dresses or overalls) and walking shoes, tennis shoes preferred (No heels or open toe shoes are allowed). Do NOT wear cologne, perfume, aftershave, or lotions (deodorant is allowed). The test will take approximately 3 to 4 hours to complete If these instructions are not followed, your test will have to be rescheduled.  ZIO XT- Long Term Monitor Instructions  Your physician has requested you wear a ZIO patch monitor for 14 days.  This is a single patch monitor. [employer] supplies one patch monitor per enrollment. Additional stickers are not available.  Please do not apply patch if you will be having a Nuclear Stress Test,  Echocardiogram, Cardiac CT, MRI, or Chest Xray during the period you would be wearing the  monitor. The patch cannot be worn during these tests. You cannot remove and re-apply the  ZIO XT patch monitor.  Your ZIO patch monitor will be mailed 3 day USPS to your address on file. It may take 3-5 days  to receive your monitor after you have been enrolled.  Once you have received your monitor, please review the enclosed instructions. Your monitor  has already been registered assigning a specific monitor serial # to you.  Billing and Patient Assistance Program Information  We have supplied [employer] with any of your insurance information on file for billing purposes. [employer] offers a sliding scale Patient Assistance Program for patients that do not have  insurance, or whose insurance does not completely cover the cost of the ZIO monitor.  You must apply for the Patient Assistance Program to qualify for this discounted rate.  To apply, please call [employer] at [phone], select option 4, select option 2, ask to apply for  Patient Assistance Program. Theodore Demark will ask your household income, and how many people  are in your household. They will quote your out-of-pocket cost based on that information.  [employer] will also be able to set up a 3-month, interest-free payment plan if needed.  Applying the monitor   Shave hair from upper left chest.  Hold abrader disc by orange tab. Rub abrader in 40 strokes over the upper left chest as  indicated in your monitor instructions.  Clean area with 4 enclosed alcohol pads. Let  dry.  Apply patch as indicated in monitor instructions. Patch will be placed under collarbone on left  side of chest with arrow pointing upward.  Rub patch adhesive wings for 2 minutes. Remove white label marked "1". Remove the white  label marked "2". Rub patch adhesive wings for 2 additional minutes.  While  looking in a mirror, press and release button in center of patch. A small green light will  flash 3-4 times. This will be your only indicator that the monitor has been turned on.  Do not shower for the first 24 hours. You may shower after the first 24 hours.  Press the button if you feel a symptom. You will hear a small click. Record Date, Time and  Symptom in the Patient Logbook.  When you are ready to remove the patch, follow instructions on the last 2 pages of Patient  Logbook. Stick patch monitor onto the last page of Patient Logbook.  Place Patient Logbook in the blue and white box. Use locking tab on box and tape box closed  securely. The blue and white box has prepaid postage on it. Please place it in the mailbox as  soon as possible. Your physician should have your test results approximately 7 days after the  monitor has been mailed back to [facility].  Call [facility] at [phone] if you have questions regarding  your ZIO XT patch monitor. Call them immediately if you see an orange light blinking on your  monitor.  If your monitor falls off in less than 4 days, contact our Monitor department at [phone].  If your monitor becomes loose or falls off after 4 days call [employer] at [phone] for  suggestions on securing your monitor    Follow-Up: At [facility], you and your health needs are our priority.  As part of our continuing mission to provide you with exceptional heart care, we have created designated Provider Care Teams.  These Care Teams include your primary Cardiologist (physician) and Advanced Practice Providers (APPs -  Physician Assistants and Nurse Practitioners) who all work together to provide you with the care you need, when you need it.  We recommend signing up for the patient portal called "MyChart".  Sign up information is provided on this After Visit Summary.  MyChart is used to connect with patients for Virtual Visits  (Telemedicine).  Patients are able to view lab/test results, encounter notes, upcoming appointments, etc.  Non-urgent messages can be sent to your provider as well.   To learn more about what you can do with MyChart, go to [email or web address].    Your next appointment:   4-5 week(s)  The format for your next appointment:   In Person  Provider:   Quay Burow, MD

## 2021-03-25 NOTE — Assessment & Plan Note (Signed)
Jeffrey Acevedo has complained of palpitations of last several days associated with his chest pain and shortness of breath.  We will check a 2-week Zio patch to further evaluate.

## 2021-03-25 NOTE — Addendum Note (Signed)
Addended by: Lorretta Harp on: 03/25/2021 03:43 PM   Modules accepted: Orders

## 2021-03-25 NOTE — Progress Notes (Unsigned)
Enrolled patient for a 14 day Zio XT  monitor to be mailed to patients home  °

## 2021-03-25 NOTE — Assessment & Plan Note (Signed)
WithHistory of hyperlipidemia on statin therapy profile performed 08/17/2020 revealing total cholesterol 124, LDL 63 and HDL 37.

## 2021-03-25 NOTE — Progress Notes (Signed)
03/25/2021 Jeffrey Acevedo   28-Mar-1940  [id number]  Primary Physician [name], MD Primary Cardiologist: Runell Gess MD FACP, [place], [place], [place]  HPI:  Jeffrey Acevedo is a 81 y.o.  overweight married Caucasian male, father of 2 and grandfather of 4 grandchildren, whom I last saw in the office 08/17/2020.[name]  He is accompanied by his daughter Selena Batten today.  He has a history of CAD, status post LAD stenting by me using bare-metal stent April 16, 1996. He had normal LV function at that time with normal renal arteries as well. His other problems include essential hypertension, hyperlipidemia. He had a normal Myoview February 2011 and was scheduled to have elective robotic prostatectomy for prostate cancer by Dr. Crecencio Mc. He developed exertional chest pain similar to his pre-stent symptoms, which were fairly reproducible and because of this, and despite a negative Myoview, I elected to recatheterize him June 17, 2009, revealing a patent proximal LAD stent with 90% stenosis just beyond this, which I then stented with a Multi-Link Vision bare-metal stent with an excellent result. Since I saw him a year ago he's been completely asymptomatic. He did have a Myoview stress test performed 03/31/15 which was entirely normal.. This was performed for preoperative clearance before a right total hip replacement on 04/19/15 which was uneventful.   Since I saw him in the office 6 months ago he has noticed new onset exertional chest burning, dyspnea and palpitations of the last several days.  He also complains of recurrent right hip pain.   Current Meds  Medication Sig   amLODipine (NORVASC) 10 MG tablet Take 1 tablet (10 mg total) by mouth daily.   aspirin EC 325 MG tablet Take 325 mg by mouth daily.   Choline Fenofibrate (FENOFIBRIC ACID) 45 MG CPDR Take 1 capsule by mouth once daily   clobetasol ointment (TEMOVATE) 0.05 % as needed.   clopidogrel (PLAVIX) 75 MG tablet TAKE ONE TABLET BY MOUTH ONCE  DAILY   irbesartan-hydrochlorothiazide (AVALIDE) 300-12.5 MG tablet Take 1 tablet by mouth once daily   metoprolol tartrate (LOPRESSOR) 25 MG tablet Take 1 tablet by mouth once daily   Omega-3 Fatty Acids (FISH OIL) 1000 MG CAPS Take 1 capsule by mouth every other day.   pravastatin (PRAVACHOL) 40 MG tablet Take 1 tablet by mouth once daily   spironolactone (ALDACTONE) 25 MG tablet Take 1 tablet by mouth once daily     Allergies  Allergen Reactions   Adhesive [Tape] Other (See Comments)    Mainly just redness.   Has been ruled out for latex allegery   Hydrochlorothiazide Other (See Comments)    Pancreatitis   Latex Other (See Comments)    unknown   Vantin [Cefpodoxime] Rash    Social History   Socioeconomic History   Marital status: Married    Spouse name: Not on file   Number of children: Not on file   Years of education: Not on file   Highest education level: Not on file  Occupational History   Not on file  Tobacco Use   Smoking status: Never   Smokeless tobacco: Never  Substance and Sexual Activity   Alcohol use: No   Drug use: No   Sexual activity: Not on file  Other Topics Concern   Not on file  Social History Narrative   Not on file   Social Determinants of Health   Financial Resource Strain: Not on file  Food Insecurity: Not on file  Transportation Needs: Not on file  Physical Activity: Not on file  Stress: Not on file  Social Connections: Not on file  Intimate Partner Violence: Not on file     Review of Systems: General: negative for chills, fever, night sweats or weight changes.  Cardiovascular: negative for chest pain, dyspnea on exertion, edema, orthopnea, palpitations, paroxysmal nocturnal dyspnea or shortness of breath Dermatological: negative for rash Respiratory: negative for cough or wheezing Urologic: negative for hematuria Abdominal: negative for nausea, vomiting, diarrhea, bright red blood per rectum, melena, or hematemesis Neurologic:  negative for visual changes, syncope, or dizziness All other systems reviewed and are otherwise negative except as noted above.    Blood pressure 118/88, pulse 67, height 5\' 11"  (1.803 m), weight 207 lb (93.9 kg).  General appearance: alert and no distress Neck: no adenopathy, no carotid bruit, no JVD, supple, symmetrical, trachea midline, and thyroid not enlarged, symmetric, no tenderness/mass/nodules Lungs: clear to auscultation bilaterally Heart: regular rate and rhythm, S1, S2 normal, no murmur, click, rub or gallop Extremities: extremities normal, atraumatic, no cyanosis or edema Pulses: 2+ and symmetric Skin: Skin color, texture, turgor normal. No rashes or lesions Neurologic: Grossly normal  EKG sinus rhythm at 67 with frequent PVCs and nonspecific ST and T wave changes.  I personally reviewed this EKG.  ASSESSMENT AND PLAN:   Essential hypertension History of essential hypertension blood pressure measured today at 118/88.  He is on amlodipine, Avalide and metoprolol.  Hyperlipidemia WithHistory of hyperlipidemia on statin therapy profile performed 08/17/2020 revealing total cholesterol 124, LDL 63 and HDL 37.  Coronary artery disease History of CAD status post LAD bare-metal stenting by myself 04/16/1996.  Because of chest pain despite a negative Myoview I elected to recatheterized him 06/17/2009 revealing a patent proximal LAD stent with a 90% stenosis just beyond this which I then stented with a MultiLink vision bare-metal stent with excellent result.  He had a Myoview performed for preoperative clearance prior to a right total hip replacement on 03/31/2015 which was normal as well.  Over the last several days he has noticed exertional chest burning, dyspnea and palpitations.  I am going to get a [employer], and 2D echocardiogram.  Palpitations Mr. Jaggard has complained of palpitations of last several days associated with his chest pain and shortness of breath.  We will check  a 2-week Zio patch to further evaluate.     Runell Gess MD FACP,FACC,FAHA, [facility] 03/25/2021 3:24 PM

## 2021-03-25 NOTE — Assessment & Plan Note (Signed)
History of CAD status post LAD bare-metal stenting by myself 04/16/1996.  Because of chest pain despite a negative Myoview I elected to recatheterized him 06/17/2009 revealing a patent proximal LAD stent with a 90% stenosis just beyond this which I then stented with a MultiLink vision bare-metal stent with excellent result.  He had a Myoview performed for preoperative clearance prior to a right total hip replacement on 03/31/2015 which was normal as well.  Over the last several days he has noticed exertional chest burning, dyspnea and palpitations.  I am going to get a [employer], and 2D echocardiogram.

## 2021-03-25 NOTE — Assessment & Plan Note (Signed)
History of essential hypertension blood pressure measured today at 118/88.  He is on amlodipine, Avalide and metoprolol.

## 2021-03-29 ENCOUNTER — Telehealth: Payer: Self-pay | Admitting: Cardiovascular Disease

## 2021-03-29 NOTE — Telephone Encounter (Signed)
Patient states he received his heart monitor, but his instructions advised not to have an echo or stress test while wearing the monitor. He would like to confirm whether this is true as he is scheduled to have both tests within the next few weeks.

## 2021-03-29 NOTE — Telephone Encounter (Signed)
Closing encounter.  Lorretta Harp, MD  You 16 minutes ago (4:21 PM)   That is fine with me

## 2021-03-29 NOTE — Telephone Encounter (Signed)
Called and spoke with patient. He has an Echo scheduled for 04/04/21, and a Myoview scheduled for 04/19/21. I advised that he wait and apply the monitor after his Echocardiogram on 04/04/21 as he cannot have it on during an Echo. He was advised to wear it for 14 days, so he would be able to remove it on 04/18/21 prior to his Myoview.  Will route to Dr. Gwenlyn Found and his nurse. Patient will proceed with this plan unless he is notified by someone else with different instructions.

## 2021-04-04 ENCOUNTER — Ambulatory Visit (HOSPITAL_COMMUNITY): Payer: Medicare PPO | Attending: Cardiology

## 2021-04-04 ENCOUNTER — Other Ambulatory Visit: Payer: Self-pay

## 2021-04-04 DIAGNOSIS — E782 Mixed hyperlipidemia: Secondary | ICD-10-CM

## 2021-04-04 DIAGNOSIS — I2583 Coronary atherosclerosis due to lipid rich plaque: Secondary | ICD-10-CM | POA: Diagnosis not present

## 2021-04-04 DIAGNOSIS — I2510 Atherosclerotic heart disease of native coronary artery without angina pectoris: Secondary | ICD-10-CM | POA: Diagnosis not present

## 2021-04-04 DIAGNOSIS — I10 Essential (primary) hypertension: Secondary | ICD-10-CM

## 2021-04-04 DIAGNOSIS — R002 Palpitations: Secondary | ICD-10-CM | POA: Diagnosis not present

## 2021-04-04 LAB — ECHOCARDIOGRAM COMPLETE
Area-P 1/2: 1.93 cm2
S' Lateral: 2.5 cm

## 2021-04-15 ENCOUNTER — Telehealth (HOSPITAL_COMMUNITY): Payer: Self-pay | Admitting: *Deleted

## 2021-04-15 NOTE — Telephone Encounter (Signed)
Close encounter 

## 2021-04-19 ENCOUNTER — Ambulatory Visit (HOSPITAL_COMMUNITY)
Admission: RE | Admit: 2021-04-19 | Discharge: 2021-04-19 | Disposition: A | Discharge status: Home / Self Care | Payer: Medicare PPO | Source: Ambulatory Visit | Attending: Cardiovascular Disease | Admitting: Cardiovascular Disease

## 2021-04-19 ENCOUNTER — Other Ambulatory Visit: Payer: Self-pay

## 2021-04-19 DIAGNOSIS — E782 Mixed hyperlipidemia: Secondary | ICD-10-CM | POA: Insufficient documentation

## 2021-04-19 DIAGNOSIS — I2583 Coronary atherosclerosis due to lipid rich plaque: Secondary | ICD-10-CM | POA: Insufficient documentation

## 2021-04-19 DIAGNOSIS — I10 Essential (primary) hypertension: Secondary | ICD-10-CM | POA: Diagnosis not present

## 2021-04-19 DIAGNOSIS — R002 Palpitations: Secondary | ICD-10-CM | POA: Insufficient documentation

## 2021-04-19 DIAGNOSIS — I2510 Atherosclerotic heart disease of native coronary artery without angina pectoris: Secondary | ICD-10-CM | POA: Insufficient documentation

## 2021-04-19 LAB — MYOCARDIAL PERFUSION IMAGING
LV dias vol: 127 mL (ref 62–150)
LV sys vol: 58 mL
Nuc Stress EF: 54 %
Peak HR: 73 {beats}/min
Rest HR: 54 {beats}/min
Rest Nuclear Isotope Dose: 10.9 mCi
SDS: 1
SRS: 2
SSS: 3
ST Depression (mm): 0 mm
Stress Nuclear Isotope Dose: 31.8 mCi
TID: 1.07

## 2021-04-19 MED ORDER — REGADENOSON 0.4 MG/5ML IV SOLN
0.4000 mg | Freq: Once | INTRAVENOUS | Status: AC
  Administered 2021-04-19: 0.4 mg via INTRAVENOUS

## 2021-04-19 MED ORDER — TECHNETIUM TC 99M TETROFOSMIN IV KIT
10.9000 | PACK | Freq: Once | INTRAVENOUS | Status: AC | PRN
  Administered 2021-04-19: 10.9 via INTRAVENOUS
  Filled 2021-04-19: qty 11

## 2021-04-19 MED ORDER — TECHNETIUM TC 99M TETROFOSMIN IV KIT
31.8000 | PACK | Freq: Once | INTRAVENOUS | Status: AC | PRN
  Administered 2021-04-19: 31.8 via INTRAVENOUS
  Filled 2021-04-19: qty 32

## 2021-04-26 ENCOUNTER — Ambulatory Visit: Payer: Medicare PPO | Admitting: Cardiovascular Disease

## 2021-04-26 ENCOUNTER — Ambulatory Visit
Admission: RE | Admit: 2021-04-26 | Discharge: 2021-04-26 | Disposition: A | Discharge status: Home / Self Care | Payer: Medicare PPO | Source: Ambulatory Visit | Attending: Cardiovascular Disease | Admitting: Cardiovascular Disease

## 2021-04-26 ENCOUNTER — Other Ambulatory Visit: Payer: Self-pay

## 2021-04-26 ENCOUNTER — Encounter: Payer: Self-pay | Admitting: Cardiovascular Disease

## 2021-04-26 VITALS — BP 150/82 | HR 54 | Ht 71.0 in | Wt 205.6 lb

## 2021-04-26 DIAGNOSIS — I2510 Atherosclerotic heart disease of native coronary artery without angina pectoris: Secondary | ICD-10-CM

## 2021-04-26 DIAGNOSIS — R0609 Other forms of dyspnea: Secondary | ICD-10-CM

## 2021-04-26 DIAGNOSIS — I2583 Coronary atherosclerosis due to lipid rich plaque: Secondary | ICD-10-CM | POA: Diagnosis not present

## 2021-04-26 DIAGNOSIS — R002 Palpitations: Secondary | ICD-10-CM

## 2021-04-26 MED ORDER — SODIUM CHLORIDE 0.9% FLUSH: 3.0000 mL | Freq: Two times a day (BID) | INTRAVENOUS | Status: DC

## 2021-04-26 NOTE — Patient Instructions (Addendum)
Medication Instructions:  Your physician recommends that you continue on your current medications as directed. Please refer to the Current Medication list given to you today.  *If you need a refill on your cardiac medications before your next appointment, please call your pharmacy*   Lab Work: Your physician recommends that you have labs drawn today: BMET and CBC  If you have labs (blood work) drawn today and your tests are completely normal, you will receive your results only by: [place] (if you have MyChart) OR A paper copy in the mail If you have any lab test that is abnormal or we need to change your treatment, we will call you to review the results.   Testing/Procedures: A chest x-ray takes a picture of the organs and structures inside the chest, including the heart, lungs, and blood vessels. This test can show several things, including, whether the heart is enlarges; whether fluid is building up in the lungs; and whether pacemaker / defibrillator leads are still in place.  To be done at [address]   Follow-Up: At [facility], you and your health needs are our priority.  As part of our continuing mission to provide you with exceptional heart care, we have created designated Provider Care Teams.  These Care Teams include your primary Cardiologist (physician) and Advanced Practice Providers (APPs -  Physician Assistants and Nurse Practitioners) who all work together to provide you with the care you need, when you need it.  We recommend signing up for the patient portal called "MyChart".  Sign up information is provided on this After Visit Summary.  MyChart is used to connect with patients for Virtual Visits (Telemedicine).  Patients are able to view lab/test results, encounter notes, upcoming appointments, etc.  Non-urgent messages can be sent to your provider as well.   To learn more about what you can do with MyChart, go to [email or web address].    Your next  appointment:   2-3 week(s)  The format for your next appointment:   In Person  Provider:   Quay Burow, MD   Other Instructions  [address] [place] [address] Dept: [phone] Loc: [place]  04/26/2021  You are scheduled for a Cardiac Catheterization on Monday, February 13 with Dr. Quay Burow.  1. Please arrive at the [facility] (Main Entrance A) at [facility]: [address] [place], [place] [address] at 9:30 AM (This time is two hours before your procedure to ensure your preparation). Free valet parking service is available.   Special note: Every effort is made to have your procedure done on time. Please understand that emergencies sometimes delay scheduled procedures.  2. Diet: Do not eat solid foods after midnight.  The patient may have clear liquids until 5am upon the day of the procedure.  3. Labs: You will need to have blood drawn on today.  4. Medication instructions in preparation for your procedure:   On the morning of your procedure, take your Aspirin and Plavix/Clopidogrel and any morning medicines NOT listed above.  You may use sips of water.  5. Plan for one night stay--bring personal belongings. 6. Bring a current list of your medications and current insurance cards. 7. You MUST have a responsible person to drive you home. 8. Someone MUST be with you the first 24 hours after you arrive home or your discharge will be delayed. 9. Please wear clothes  that are easy to get on and off and wear slip-on shoes.  Thank you for allowing [place] to care for you!   -- [facility]

## 2021-04-26 NOTE — H&P (View-Only) (Signed)
04/26/2021 ELEMER GUIMOND   1940-08-25  [id number]  Primary Physician [name], MD Primary Cardiologist: Runell Gess MD FACP, [place], [place], [place]  HPI:  Jeffrey Acevedo is a 81 y.o.   overweight married Caucasian male, father of 2 and grandfather of 4 grandchildren, whom I last saw in the office 03/25/2021.[name]  He is accompanied by his daughter Myles Lipps today.  He has a history of CAD, status post LAD stenting by me using bare-metal stent April 16, 1996. He had normal LV function at that time with normal renal arteries as well. His other problems include essential hypertension, hyperlipidemia. He had a normal Myoview February 2011 and was scheduled to have elective robotic prostatectomy for prostate cancer by Dr. Crecencio Mc. He developed exertional chest pain similar to his pre-stent symptoms, which were fairly reproducible and because of this, and despite a negative Myoview, I elected to recatheterize him June 17, 2009, revealing a patent proximal LAD stent with 90% stenosis just beyond this, which I then stented with a Multi-Link Vision bare-metal stent with an excellent result. Since I saw him a year ago he's been completely asymptomatic. He did have a Myoview stress test performed 03/31/15 which was entirely normal.. This was performed for preoperative clearance before a right total hip replacement on 04/19/15 which was uneventful.   Since I saw him in the office 6 weeks ago I did get a 2D echo which was unremarkable, Myoview stress test which was nonischemic and a event monitor that showed frequent PVCs and short runs of nonsustained ventricular tachycardia and SVT.  He clearly is symptomatic.  Because his last stress test in 2011 was falsely negative and cath subsequently revealed a 90% LAD lesion we decided jointly to proceed with outpatient diagnostic coronary angiography.   Current Meds  Medication Sig   amLODipine (NORVASC) 10 MG tablet Take 1 tablet (10 mg total) by mouth  daily.   aspirin EC 325 MG tablet Take 325 mg by mouth daily.   Choline Fenofibrate (FENOFIBRIC ACID) 45 MG CPDR Take 1 capsule by mouth once daily   clobetasol ointment (TEMOVATE) 0.05 % as needed.   clopidogrel (PLAVIX) 75 MG tablet TAKE ONE TABLET BY MOUTH ONCE DAILY   irbesartan-hydrochlorothiazide (AVALIDE) 300-12.5 MG tablet Take 1 tablet by mouth once daily   metoprolol tartrate (LOPRESSOR) 25 MG tablet Take 1 tablet by mouth once daily   Omega-3 Fatty Acids (FISH OIL) 1000 MG CAPS Take 1 capsule by mouth every other day.   pravastatin (PRAVACHOL) 40 MG tablet Take 1 tablet by mouth once daily   spironolactone (ALDACTONE) 25 MG tablet Take 1 tablet by mouth once daily     Allergies  Allergen Reactions   Adhesive [Tape] Other (See Comments)    Mainly just redness.   Has been ruled out for latex allegery   Hydrochlorothiazide Other (See Comments)    Pancreatitis   Latex Other (See Comments)    unknown   Vantin [Cefpodoxime] Rash    Social History   Socioeconomic History   Marital status: Married    Spouse name: Not on file   Number of children: Not on file   Years of education: Not on file   Highest education level: Not on file  Occupational History   Not on file  Tobacco Use   Smoking status: Never   Smokeless tobacco: Never  Substance and Sexual Activity   Alcohol use: No   Drug use: No   Sexual  activity: Not on file  Other Topics Concern   Not on file  Social History Narrative   Not on file   Social Determinants of Health   Financial Resource Strain: Not on file  Food Insecurity: Not on file  Transportation Needs: Not on file  Physical Activity: Not on file  Stress: Not on file  Social Connections: Not on file  Intimate Partner Violence: Not on file     Review of Systems: General: negative for chills, fever, night sweats or weight changes.  Cardiovascular: negative for chest pain, dyspnea on exertion, edema, orthopnea, palpitations, paroxysmal  nocturnal dyspnea or shortness of breath Dermatological: negative for rash Respiratory: negative for cough or wheezing Urologic: negative for hematuria Abdominal: negative for nausea, vomiting, diarrhea, bright red blood per rectum, melena, or hematemesis Neurologic: negative for visual changes, syncope, or dizziness All other systems reviewed and are otherwise negative except as noted above.    Blood pressure (!) 150/82, pulse (!) 54, height 5\' 11"  (1.803 m), weight 205 lb 9.6 oz (93.3 kg), SpO2 97 %.  General appearance: alert and no distress Neck: no adenopathy, no carotid bruit, no JVD, supple, symmetrical, trachea midline, and thyroid not enlarged, symmetric, no tenderness/mass/nodules Lungs: clear to auscultation bilaterally Heart: regular rate and rhythm, S1, S2 normal, no murmur, click, rub or gallop Extremities: extremities normal, atraumatic, no cyanosis or edema Pulses: 2+ and symmetric Skin: Skin color, texture, turgor normal. No rashes or lesions Neurologic: Grossly normal  EKG sinus bradycardia 54 with nonspecific ST and T wave changes.  Personally reviewed this EKG.  ASSESSMENT AND PLAN:   Coronary artery disease History of CAD status post LAD stenting using a bare-metal stent by myself 04/16/1996.  Normal LV function at that time.  Because of her current symptoms I did a Myoview stress test February 2011 which was normal but because of ongoing symptoms I elected to recatheterized him 06/17/2009 revealing a patent proximal LAD stent with 90% stenosis just beyond this which I restented using a MultiLink vision bare-metal stent.  He had no other significant disease.  He is recently complained of increasing shortness of breath and chest pain.  Once again a Myoview stress test performed 04/19/2021 was normal.  Given his possible negative stress test 12 years ago and his progressive symptoms I feel compelled to again perform outpatient radial diagnostic cath.   I have reviewed the  risks, indications, and alternatives to cardiac catheterization, possible angioplasty, and stenting with the patient. Risks include but are not limited to bleeding, infection, vascular injury, stroke, myocardial infection, arrhythmia, kidney injury, radiation-related injury in the case of prolonged fluoroscopy use, emergency cardiac surgery, and death. The patient understands the risks of serious complication is 1-2 in 1000 with diagnostic cardiac cath and 1-2% or less with angioplasty/stenting.   Palpitations Mr. Palermo is complaining of progressive palpitations.  Recent monitor performed 03/25/2021 revealed frequent PVCs (3.1%), rare PACs, runs of SVT and nonsustained ventricular tachycardia.  He already is on a beta-blocker.     Runell Gess MD FACP,FACC,FAHA, FSCAI 04/26/2021 12:00 PM

## 2021-04-26 NOTE — Progress Notes (Signed)
04/26/2021 ELEMER GUIMOND   1940-08-25  [id number]  Primary Physician [name], MD Primary Cardiologist: Runell Gess MD FACP, [place], [place], [place]  HPI:  Jeffrey Acevedo is a 81 y.o.   overweight married Caucasian male, father of 2 and grandfather of 4 grandchildren, whom I last saw in the office 03/25/2021.[name]  He is accompanied by his daughter Myles Lipps today.  He has a history of CAD, status post LAD stenting by me using bare-metal stent April 16, 1996. He had normal LV function at that time with normal renal arteries as well. His other problems include essential hypertension, hyperlipidemia. He had a normal Myoview February 2011 and was scheduled to have elective robotic prostatectomy for prostate cancer by Dr. Crecencio Mc. He developed exertional chest pain similar to his pre-stent symptoms, which were fairly reproducible and because of this, and despite a negative Myoview, I elected to recatheterize him June 17, 2009, revealing a patent proximal LAD stent with 90% stenosis just beyond this, which I then stented with a Multi-Link Vision bare-metal stent with an excellent result. Since I saw him a year ago he's been completely asymptomatic. He did have a Myoview stress test performed 03/31/15 which was entirely normal.. This was performed for preoperative clearance before a right total hip replacement on 04/19/15 which was uneventful.   Since I saw him in the office 6 weeks ago I did get a 2D echo which was unremarkable, Myoview stress test which was nonischemic and a event monitor that showed frequent PVCs and short runs of nonsustained ventricular tachycardia and SVT.  He clearly is symptomatic.  Because his last stress test in 2011 was falsely negative and cath subsequently revealed a 90% LAD lesion we decided jointly to proceed with outpatient diagnostic coronary angiography.   Current Meds  Medication Sig   amLODipine (NORVASC) 10 MG tablet Take 1 tablet (10 mg total) by mouth  daily.   aspirin EC 325 MG tablet Take 325 mg by mouth daily.   Choline Fenofibrate (FENOFIBRIC ACID) 45 MG CPDR Take 1 capsule by mouth once daily   clobetasol ointment (TEMOVATE) 0.05 % as needed.   clopidogrel (PLAVIX) 75 MG tablet TAKE ONE TABLET BY MOUTH ONCE DAILY   irbesartan-hydrochlorothiazide (AVALIDE) 300-12.5 MG tablet Take 1 tablet by mouth once daily   metoprolol tartrate (LOPRESSOR) 25 MG tablet Take 1 tablet by mouth once daily   Omega-3 Fatty Acids (FISH OIL) 1000 MG CAPS Take 1 capsule by mouth every other day.   pravastatin (PRAVACHOL) 40 MG tablet Take 1 tablet by mouth once daily   spironolactone (ALDACTONE) 25 MG tablet Take 1 tablet by mouth once daily     Allergies  Allergen Reactions   Adhesive [Tape] Other (See Comments)    Mainly just redness.   Has been ruled out for latex allegery   Hydrochlorothiazide Other (See Comments)    Pancreatitis   Latex Other (See Comments)    unknown   Vantin [Cefpodoxime] Rash    Social History   Socioeconomic History   Marital status: Married    Spouse name: Not on file   Number of children: Not on file   Years of education: Not on file   Highest education level: Not on file  Occupational History   Not on file  Tobacco Use   Smoking status: Never   Smokeless tobacco: Never  Substance and Sexual Activity   Alcohol use: No   Drug use: No   Sexual  activity: Not on file  Other Topics Concern   Not on file  Social History Narrative   Not on file   Social Determinants of Health   Financial Resource Strain: Not on file  Food Insecurity: Not on file  Transportation Needs: Not on file  Physical Activity: Not on file  Stress: Not on file  Social Connections: Not on file  Intimate Partner Violence: Not on file     Review of Systems: General: negative for chills, fever, night sweats or weight changes.  Cardiovascular: negative for chest pain, dyspnea on exertion, edema, orthopnea, palpitations, paroxysmal  nocturnal dyspnea or shortness of breath Dermatological: negative for rash Respiratory: negative for cough or wheezing Urologic: negative for hematuria Abdominal: negative for nausea, vomiting, diarrhea, bright red blood per rectum, melena, or hematemesis Neurologic: negative for visual changes, syncope, or dizziness All other systems reviewed and are otherwise negative except as noted above.    Blood pressure (!) 150/82, pulse (!) 54, height 5\' 11"  (1.803 m), weight 205 lb 9.6 oz (93.3 kg), SpO2 97 %.  General appearance: alert and no distress Neck: no adenopathy, no carotid bruit, no JVD, supple, symmetrical, trachea midline, and thyroid not enlarged, symmetric, no tenderness/mass/nodules Lungs: clear to auscultation bilaterally Heart: regular rate and rhythm, S1, S2 normal, no murmur, click, rub or gallop Extremities: extremities normal, atraumatic, no cyanosis or edema Pulses: 2+ and symmetric Skin: Skin color, texture, turgor normal. No rashes or lesions Neurologic: Grossly normal  EKG sinus bradycardia 54 with nonspecific ST and T wave changes.  Personally reviewed this EKG.  ASSESSMENT AND PLAN:   Coronary artery disease History of CAD status post LAD stenting using a bare-metal stent by myself 04/16/1996.  Normal LV function at that time.  Because of her current symptoms I did a Myoview stress test February 2011 which was normal but because of ongoing symptoms I elected to recatheterized him 06/17/2009 revealing a patent proximal LAD stent with 90% stenosis just beyond this which I restented using a MultiLink vision bare-metal stent.  He had no other significant disease.  He is recently complained of increasing shortness of breath and chest pain.  Once again a Myoview stress test performed 04/19/2021 was normal.  Given his possible negative stress test 12 years ago and his progressive symptoms I feel compelled to again perform outpatient radial diagnostic cath.   I have reviewed the  risks, indications, and alternatives to cardiac catheterization, possible angioplasty, and stenting with the patient. Risks include but are not limited to bleeding, infection, vascular injury, stroke, myocardial infection, arrhythmia, kidney injury, radiation-related injury in the case of prolonged fluoroscopy use, emergency cardiac surgery, and death. The patient understands the risks of serious complication is 1-2 in 1000 with diagnostic cardiac cath and 1-2% or less with angioplasty/stenting.   Palpitations Mr. Palermo is complaining of progressive palpitations.  Recent monitor performed 03/25/2021 revealed frequent PVCs (3.1%), rare PACs, runs of SVT and nonsustained ventricular tachycardia.  He already is on a beta-blocker.     Runell Gess MD FACP,FACC,FAHA, FSCAI 04/26/2021 12:00 PM

## 2021-04-26 NOTE — Assessment & Plan Note (Signed)
Jeffrey Acevedo is complaining of progressive palpitations.  Recent monitor performed 03/25/2021 revealed frequent PVCs (3.1%), rare PACs, runs of SVT and nonsustained ventricular tachycardia.  He already is on a beta-blocker.

## 2021-04-26 NOTE — Assessment & Plan Note (Signed)
History of CAD status post LAD stenting using a bare-metal stent by myself 04/16/1996.  Normal LV function at that time.  Because of her current symptoms I did a Myoview stress test February 2011 which was normal but because of ongoing symptoms I elected to recatheterized him 06/17/2009 revealing a patent proximal LAD stent with 90% stenosis just beyond this which I restented using a MultiLink vision bare-metal stent.  He had no other significant disease.  He is recently complained of increasing shortness of breath and chest pain.  Once again a Myoview stress test performed 04/19/2021 was normal.  Given his possible negative stress test 12 years ago and his progressive symptoms I feel compelled to again perform outpatient radial diagnostic cath.  I have reviewed the risks, indications, and alternatives to cardiac catheterization, possible angioplasty, and stenting with the patient. Risks include but are not limited to bleeding, infection, vascular injury, stroke, myocardial infection, arrhythmia, kidney injury, radiation-related injury in the case of prolonged fluoroscopy use, emergency cardiac surgery, and death. The patient understands the risks of serious complication is 1-2 in 6629 with diagnostic cardiac cath and 1-2% or less with angioplasty/stenting.

## 2021-04-27 LAB — CBC
Hematocrit: 49.1 % (ref 37.5–51.0)
Hemoglobin: 16.5 g/dL (ref 13.0–17.7)
MCH: 28.1 pg (ref 26.6–33.0)
MCHC: 33.6 g/dL (ref 31.5–35.7)
MCV: 84 fL (ref 79–97)
Platelets: 299 10*3/uL (ref 150–450)
RBC: 5.88 x10E6/uL — ABNORMAL HIGH (ref 4.14–5.80)
RDW: 15.1 % (ref 11.6–15.4)
WBC: 11.4 10*3/uL — ABNORMAL HIGH (ref 3.4–10.8)

## 2021-04-27 LAB — BASIC METABOLIC PANEL
BUN/Creatinine Ratio: 17 (ref 10–24)
BUN: 20 mg/dL (ref 8–27)
CO2: 23 mmol/L (ref 20–29)
Calcium: 10.2 mg/dL (ref 8.6–10.2)
Chloride: 100 mmol/L (ref 96–106)
Creatinine, Ser: 1.21 mg/dL (ref 0.76–1.27)
Glucose: 75 mg/dL (ref 70–99)
Potassium: 4.7 mmol/L (ref 3.5–5.2)
Sodium: 140 mmol/L (ref 134–144)
eGFR: 61 mL/min/{1.73_m2} (ref >59)

## 2021-04-28 ENCOUNTER — Telehealth: Payer: Self-pay | Admitting: *Deleted

## 2021-04-28 NOTE — Telephone Encounter (Signed)
.  Cardiac catheterization scheduled at [facility] for: Monday May 02, 2021 11:30 AM [facility] Main Entrance [facility]) at: 9:30 AM   Diet-no solid food after midnight prior to cath, clear liquids until 5 AM day of procedure.  Medication instructions for procedure: -Hold:   Irbesartan-HCT-AM of procedure  Spironolactone-AM of procedure -Except hold medications usual morning medications can be taken pre-cath with sips of water including aspirin 81 mg and Plavix 75 mg.    Must have responsible adult to drive home post procedure and be with patient first 24 hours after arriving home.  [facility] Main does allow one visitor to wait in the hospital waiting room while you are there for your procedure.    Patient reports does not currently have any new symptoms concerning for COVID-19 and no household members with COVID-19 like illness.        Reviewed procedure instructions with patient's wife (DPR).

## 2021-05-02 ENCOUNTER — Other Ambulatory Visit: Payer: Self-pay

## 2021-05-02 ENCOUNTER — Encounter (HOSPITAL_COMMUNITY)
Admission: RE | Disposition: A | Discharge status: Home / Self Care | Payer: Self-pay | Source: Home / Self Care | Attending: Cardiovascular Disease

## 2021-05-02 ENCOUNTER — Observation Stay (HOSPITAL_COMMUNITY)
Admission: RE | Admit: 2021-05-02 | Discharge: 2021-05-03 | Disposition: A | Discharge status: Home / Self Care | Payer: Medicare PPO | Attending: Cardiovascular Disease | Admitting: Cardiovascular Disease

## 2021-05-02 DIAGNOSIS — Z20822 Contact with and (suspected) exposure to covid-19: Secondary | ICD-10-CM | POA: Diagnosis not present

## 2021-05-02 DIAGNOSIS — Z7902 Long term (current) use of antithrombotics/antiplatelets: Secondary | ICD-10-CM | POA: Diagnosis not present

## 2021-05-02 DIAGNOSIS — Z79899 Other long term (current) drug therapy: Secondary | ICD-10-CM | POA: Diagnosis not present

## 2021-05-02 DIAGNOSIS — Z7982 Long term (current) use of aspirin: Secondary | ICD-10-CM | POA: Diagnosis not present

## 2021-05-02 DIAGNOSIS — Z955 Presence of coronary angioplasty implant and graft: Secondary | ICD-10-CM

## 2021-05-02 DIAGNOSIS — I10 Essential (primary) hypertension: Secondary | ICD-10-CM | POA: Insufficient documentation

## 2021-05-02 DIAGNOSIS — R0609 Other forms of dyspnea: Secondary | ICD-10-CM

## 2021-05-02 DIAGNOSIS — Z91040 Latex allergy status: Secondary | ICD-10-CM | POA: Diagnosis not present

## 2021-05-02 DIAGNOSIS — E785 Hyperlipidemia, unspecified: Secondary | ICD-10-CM | POA: Diagnosis present

## 2021-05-02 DIAGNOSIS — I2510 Atherosclerotic heart disease of native coronary artery without angina pectoris: Secondary | ICD-10-CM | POA: Diagnosis not present

## 2021-05-02 HISTORY — PX: CORONARY STENT INTERVENTION: CATH118234

## 2021-05-02 HISTORY — PX: CORONARY PRESSURE/FFR STUDY: CATH118243

## 2021-05-02 HISTORY — PX: LEFT HEART CATH AND CORONARY ANGIOGRAPHY: CATH118249

## 2021-05-02 LAB — POCT ACTIVATED CLOTTING TIME
Activated Clotting Time: 257 seconds
Activated Clotting Time: 293 seconds

## 2021-05-02 LAB — SARS CORONAVIRUS 2 BY RT PCR (HOSPITAL ORDER, PERFORMED IN ~~LOC~~ HOSPITAL LAB): SARS Coronavirus 2: NEGATIVE

## 2021-05-02 SURGERY — LEFT HEART CATH AND CORONARY ANGIOGRAPHY
Anesthesia: LOCAL

## 2021-05-02 MED ORDER — CLOPIDOGREL BISULFATE 75 MG PO TABS: 75.0000 mg | ORAL_TABLET | Freq: Every day | ORAL | Status: DC

## 2021-05-02 MED ORDER — SODIUM CHLORIDE 0.9% FLUSH
3.0000 mL | Freq: Two times a day (BID) | INTRAVENOUS | Status: DC
  Administered 2021-05-02 – 2021-05-03 (×2): 3 mL via INTRAVENOUS

## 2021-05-02 MED ORDER — ASPIRIN EC 325 MG PO TBEC
325.0000 mg | DELAYED_RELEASE_TABLET | Freq: Every morning | ORAL | Status: DC
  Administered 2021-05-03: 325 mg via ORAL
  Filled 2021-05-02: qty 1

## 2021-05-02 MED ORDER — AMLODIPINE BESYLATE 10 MG PO TABS: 10.0000 mg | ORAL_TABLET | Freq: Every day | ORAL | Status: DC

## 2021-05-02 MED ORDER — IRBESARTAN 150 MG PO TABS
300.0000 mg | ORAL_TABLET | Freq: Every day | ORAL | Status: DC
  Administered 2021-05-03: 300 mg via ORAL
  Filled 2021-05-02 (×2): qty 2

## 2021-05-02 MED ORDER — HEPARIN (PORCINE) IN NACL 1000-0.9 UT/500ML-% IV SOLN
INTRAVENOUS | Status: DC | PRN
  Administered 2021-05-02 (×2): 500 mL

## 2021-05-02 MED ORDER — AMLODIPINE BESYLATE 10 MG PO TABS
10.0000 mg | ORAL_TABLET | Freq: Every day | ORAL | Status: DC
  Administered 2021-05-02: 10 mg via ORAL
  Filled 2021-05-02: qty 1

## 2021-05-02 MED ORDER — LABETALOL HCL 5 MG/ML IV SOLN: 10.0000 mg | INTRAVENOUS | Status: AC | PRN

## 2021-05-02 MED ORDER — FENTANYL CITRATE (PF) 100 MCG/2ML IJ SOLN
INTRAMUSCULAR | Status: DC | PRN
  Administered 2021-05-02: 25 ug via INTRAVENOUS

## 2021-05-02 MED ORDER — SODIUM CHLORIDE 0.9 % WEIGHT BASED INFUSION: 1.0000 mL/kg/h | INTRAVENOUS | Status: DC

## 2021-05-02 MED ORDER — HEPARIN (PORCINE) IN NACL 1000-0.9 UT/500ML-% IV SOLN
INTRAVENOUS | Status: AC
  Filled 2021-05-02: qty 1000

## 2021-05-02 MED ORDER — MIDAZOLAM HCL 2 MG/2ML IJ SOLN
INTRAMUSCULAR | Status: AC
  Filled 2021-05-02: qty 2

## 2021-05-02 MED ORDER — FENTANYL CITRATE (PF) 100 MCG/2ML IJ SOLN
INTRAMUSCULAR | Status: AC
  Filled 2021-05-02: qty 2

## 2021-05-02 MED ORDER — HYDRALAZINE HCL 20 MG/ML IJ SOLN: 10.0000 mg | INTRAMUSCULAR | Status: AC | PRN

## 2021-05-02 MED ORDER — ACETAMINOPHEN 325 MG PO TABS: 650.0000 mg | ORAL_TABLET | ORAL | Status: DC | PRN

## 2021-05-02 MED ORDER — SODIUM CHLORIDE 0.9 % IV SOLN: 250.0000 mL | INTRAVENOUS | Status: DC | PRN

## 2021-05-02 MED ORDER — SODIUM CHLORIDE 0.9 % IV SOLN: INTRAVENOUS | Status: AC

## 2021-05-02 MED ORDER — HYDROCHLOROTHIAZIDE 12.5 MG PO TABS
12.5000 mg | ORAL_TABLET | Freq: Every day | ORAL | Status: DC
  Administered 2021-05-03: 12.5 mg via ORAL
  Filled 2021-05-02 (×2): qty 1

## 2021-05-02 MED ORDER — SODIUM CHLORIDE 0.9% FLUSH: 3.0000 mL | INTRAVENOUS | Status: DC | PRN

## 2021-05-02 MED ORDER — HEPARIN SODIUM (PORCINE) 1000 UNIT/ML IJ SOLN
INTRAMUSCULAR | Status: AC
  Filled 2021-05-02: qty 10

## 2021-05-02 MED ORDER — MIDAZOLAM HCL 2 MG/2ML IJ SOLN
INTRAMUSCULAR | Status: DC | PRN
  Administered 2021-05-02: 1 mg via INTRAVENOUS

## 2021-05-02 MED ORDER — ASPIRIN 81 MG PO CHEW: 81.0000 mg | CHEWABLE_TABLET | Freq: Every day | ORAL | Status: DC

## 2021-05-02 MED ORDER — CLOPIDOGREL BISULFATE 75 MG PO TABS
75.0000 mg | ORAL_TABLET | Freq: Every day | ORAL | Status: DC
  Administered 2021-05-03: 75 mg via ORAL
  Filled 2021-05-02: qty 1

## 2021-05-02 MED ORDER — MORPHINE SULFATE (PF) 2 MG/ML IV SOLN: 2.0000 mg | INTRAVENOUS | Status: DC | PRN

## 2021-05-02 MED ORDER — IOHEXOL 350 MG/ML SOLN
INTRAVENOUS | Status: DC | PRN
  Administered 2021-05-02: 140 mL

## 2021-05-02 MED ORDER — HEPARIN SODIUM (PORCINE) 1000 UNIT/ML IJ SOLN
INTRAMUSCULAR | Status: DC | PRN
  Administered 2021-05-02: 3000 [IU] via INTRAVENOUS
  Administered 2021-05-02: 5000 [IU] via INTRAVENOUS
  Administered 2021-05-02: 4500 [IU] via INTRAVENOUS

## 2021-05-02 MED ORDER — NITROGLYCERIN 1 MG/10 ML FOR IR/CATH LAB
INTRA_ARTERIAL | Status: AC
  Filled 2021-05-02: qty 10

## 2021-05-02 MED ORDER — SPIRONOLACTONE 25 MG PO TABS
25.0000 mg | ORAL_TABLET | Freq: Every day | ORAL | Status: DC
  Administered 2021-05-03: 25 mg via ORAL
  Filled 2021-05-02 (×2): qty 1

## 2021-05-02 MED ORDER — IRBESARTAN-HYDROCHLOROTHIAZIDE 300-12.5 MG PO TABS
1.0000 | ORAL_TABLET | Freq: Every day | ORAL | Status: DC
Start: 2021-05-02 — End: 2021-05-02

## 2021-05-02 MED ORDER — FENOFIBRIC ACID 45 MG PO CPDR
1.0000 | DELAYED_RELEASE_CAPSULE | Freq: Every day | ORAL | Status: DC
Start: 2021-05-02 — End: 2021-05-02

## 2021-05-02 MED ORDER — ONDANSETRON HCL 4 MG/2ML IJ SOLN: 4.0000 mg | Freq: Four times a day (QID) | INTRAMUSCULAR | Status: DC | PRN

## 2021-05-02 MED ORDER — SODIUM CHLORIDE 0.9 % WEIGHT BASED INFUSION
3.0000 mL/kg/h | INTRAVENOUS | Status: DC
  Administered 2021-05-02: 3 mL/kg/h via INTRAVENOUS

## 2021-05-02 MED ORDER — ASPIRIN 81 MG PO CHEW: 81.0000 mg | CHEWABLE_TABLET | ORAL | Status: DC

## 2021-05-02 MED ORDER — LIDOCAINE HCL (PF) 1 % IJ SOLN
INTRAMUSCULAR | Status: AC
  Filled 2021-05-02: qty 30

## 2021-05-02 MED ORDER — LIDOCAINE HCL (PF) 1 % IJ SOLN
INTRAMUSCULAR | Status: DC | PRN
  Administered 2021-05-02: 2 mL

## 2021-05-02 MED ORDER — VERAPAMIL HCL 2.5 MG/ML IV SOLN
INTRA_ARTERIAL | Status: DC | PRN
  Administered 2021-05-02 (×2): 5 mL via INTRA_ARTERIAL

## 2021-05-02 MED ORDER — PRAVASTATIN SODIUM 40 MG PO TABS: 40.0000 mg | ORAL_TABLET | Freq: Every day | ORAL | Status: DC

## 2021-05-02 MED ORDER — PRAVASTATIN SODIUM 40 MG PO TABS
40.0000 mg | ORAL_TABLET | Freq: Every day | ORAL | Status: DC
  Administered 2021-05-02: 40 mg via ORAL
  Filled 2021-05-02: qty 1

## 2021-05-02 MED ORDER — FENOFIBRATE 54 MG PO TABS
54.0000 mg | ORAL_TABLET | Freq: Every day | ORAL | Status: DC
  Administered 2021-05-02: 54 mg via ORAL
  Filled 2021-05-02: qty 1

## 2021-05-02 MED ORDER — METOPROLOL TARTRATE 25 MG PO TABS
25.0000 mg | ORAL_TABLET | Freq: Every day | ORAL | Status: DC
Start: 2021-05-03 — End: 2021-05-03
  Administered 2021-05-03: 25 mg via ORAL
  Filled 2021-05-02: qty 1

## 2021-05-02 MED ORDER — VERAPAMIL HCL 2.5 MG/ML IV SOLN
INTRAVENOUS | Status: AC
  Filled 2021-05-02: qty 2

## 2021-05-02 MED ORDER — FENOFIBRATE 54 MG PO TABS
54.0000 mg | ORAL_TABLET | Freq: Every day | ORAL | Status: DC
  Filled 2021-05-02 (×2): qty 1

## 2021-05-02 SURGICAL SUPPLY — 21 items
BALLN SAPPHIRE 2.0X12 (BALLOONS) ×2
BALLN SAPPHIRE ~~LOC~~ 2.75X8 (BALLOONS) ×1 IMPLANT
BALLOON SAPPHIRE 2.0X12 (BALLOONS) IMPLANT
CATH LAUNCHER 6FR JR4 (CATHETERS) ×2 IMPLANT
CATH OPTITORQUE TIG 4.0 5F (CATHETERS) ×1 IMPLANT
CATH VISTA GUIDE 6FR XBLAD3.0 (CATHETERS) ×1 IMPLANT
DEVICE RAD COMP TR BAND LRG (VASCULAR PRODUCTS) ×1 IMPLANT
GLIDESHEATH SLEND A-KIT 6F 22G (SHEATH) ×1 IMPLANT
GUIDEWIRE INQWIRE 1.5J.035X260 (WIRE) IMPLANT
GUIDEWIRE PRESSURE X 175 (WIRE) ×1 IMPLANT
INQWIRE 1.5J .035X260CM (WIRE) ×2
KIT ENCORE 26 ADVANTAGE (KITS) ×1 IMPLANT
KIT ESSENTIALS PG (KITS) ×1 IMPLANT
KIT HEART LEFT (KITS) ×2 IMPLANT
PACK CARDIAC CATHETERIZATION (CUSTOM PROCEDURE TRAY) ×2 IMPLANT
STENT ONYX FRONTIER 2.5X12 (Permanent Stent) ×1 IMPLANT
TRANSDUCER W/STOPCOCK (MISCELLANEOUS) ×2 IMPLANT
TUBING ART PRESS 72  MALE/MALE (TUBING) ×1 IMPLANT
TUBING CIL FLEX 10 FLL-RA (TUBING) ×2 IMPLANT
WIRE ASAHI PROWATER 180CM (WIRE) ×1 IMPLANT
WIRE HI TORQ VERSACORE-J 145CM (WIRE) ×1 IMPLANT

## 2021-05-02 NOTE — Interval H&P Note (Signed)
Cath Lab Visit (complete for each Cath Lab visit)  Clinical Evaluation Leading to the Procedure:   ACS: No.  Non-ACS:    Anginal Classification: CCS II  Anti-ischemic medical therapy: Maximal Therapy (2 or more classes of medications)  Non-Invasive Test Results: No non-invasive testing performed  Prior CABG: No previous CABG      History and Physical Interval Note:  05/02/2021 9:48 AM  Jeffrey Acevedo  has presented today for surgery, with the diagnosis of chest pain.  The various methods of treatment have been discussed with the patient and family. After consideration of risks, benefits and other options for treatment, the patient has consented to  Procedure(s): LEFT HEART CATH AND CORONARY ANGIOGRAPHY (N/A) as a surgical intervention.  The patient's history has been reviewed, patient examined, no change in status, stable for surgery.  I have reviewed the patient's chart and labs.  Questions were answered to the patient's satisfaction.     Quay Burow

## 2021-05-02 NOTE — Progress Notes (Signed)
TR BAND REMOVAL  LOCATION:    radial rt radial  DEFLATED PER PROTOCOL:   yes  TIME BAND OFF / DRESSING APPLIED:    1710/gauze and tegaderm  SITE UPON ARRIVAL:    Level 0  SITE AFTER BAND REMOVAL:    Level 0  CIRCULATION SENSATION AND MOVEMENT:    Within Normal Limits : yes, rt hand and fingers warm and pink, palpable rt radial, good pleth waveform  COMMENTS:

## 2021-05-03 ENCOUNTER — Other Ambulatory Visit: Payer: Self-pay | Admitting: Cardiology

## 2021-05-03 ENCOUNTER — Encounter (HOSPITAL_COMMUNITY): Payer: Self-pay | Admitting: Cardiovascular Disease

## 2021-05-03 DIAGNOSIS — Z955 Presence of coronary angioplasty implant and graft: Secondary | ICD-10-CM | POA: Diagnosis not present

## 2021-05-03 DIAGNOSIS — Z7982 Long term (current) use of aspirin: Secondary | ICD-10-CM | POA: Diagnosis not present

## 2021-05-03 DIAGNOSIS — R0609 Other forms of dyspnea: Secondary | ICD-10-CM | POA: Diagnosis not present

## 2021-05-03 DIAGNOSIS — Z20822 Contact with and (suspected) exposure to covid-19: Secondary | ICD-10-CM | POA: Diagnosis not present

## 2021-05-03 DIAGNOSIS — I10 Essential (primary) hypertension: Secondary | ICD-10-CM | POA: Diagnosis not present

## 2021-05-03 DIAGNOSIS — I2510 Atherosclerotic heart disease of native coronary artery without angina pectoris: Secondary | ICD-10-CM | POA: Diagnosis not present

## 2021-05-03 DIAGNOSIS — E782 Mixed hyperlipidemia: Secondary | ICD-10-CM

## 2021-05-03 LAB — BASIC METABOLIC PANEL
Anion gap: 9 (ref 5–15)
BUN: 15 mg/dL (ref 8–23)
CO2: 24 mmol/L (ref 22–32)
Calcium: 9.3 mg/dL (ref 8.9–10.3)
Chloride: 103 mmol/L (ref 98–111)
Creatinine, Ser: 1.03 mg/dL (ref 0.61–1.24)
GFR, Estimated: >60 mL/min (ref >60)
Glucose, Bld: 105 mg/dL — ABNORMAL HIGH (ref 70–99)
Potassium: 4.2 mmol/L (ref 3.5–5.1)
Sodium: 136 mmol/L (ref 135–145)

## 2021-05-03 LAB — CBC
HCT: 44.6 % (ref 39.0–52.0)
Hemoglobin: 15.1 g/dL (ref 13.0–17.0)
MCH: 28.5 pg (ref 26.0–34.0)
MCHC: 33.9 g/dL (ref 30.0–36.0)
MCV: 84.3 fL (ref 80.0–100.0)
Platelets: 222 10*3/uL (ref 150–400)
RBC: 5.29 MIL/uL (ref 4.22–5.81)
RDW: 14.6 % (ref 11.5–15.5)
WBC: 8.2 10*3/uL (ref 4.0–10.5)
nRBC: 0 % (ref 0.0–0.2)

## 2021-05-03 LAB — LIPID PANEL
Cholesterol: 128 mg/dL (ref 0–200)
HDL: 34 mg/dL — ABNORMAL LOW (ref >40)
LDL Cholesterol: 60 mg/dL (ref 0–99)
Total CHOL/HDL Ratio: 3.8 RATIO
Triglycerides: 169 mg/dL — ABNORMAL HIGH (ref <150)
VLDL: 34 mg/dL (ref 0–40)

## 2021-05-03 MED ORDER — ATORVASTATIN CALCIUM 80 MG PO TABS: 80.0000 mg | ORAL_TABLET | Freq: Every day | ORAL | Status: DC

## 2021-05-03 MED ORDER — ROSUVASTATIN CALCIUM 20 MG PO TABS
40.0000 mg | ORAL_TABLET | Freq: Every day | ORAL | Status: DC
  Administered 2021-05-03: 40 mg via ORAL
  Filled 2021-05-03: qty 2

## 2021-05-03 MED ORDER — ASPIRIN 81 MG PO TBEC: 81.0000 mg | DELAYED_RELEASE_TABLET | Freq: Every day | ORAL | 2 refills | Status: DC

## 2021-05-03 MED ORDER — ROSUVASTATIN CALCIUM 40 MG PO TABS: 40.0000 mg | ORAL_TABLET | Freq: Every day | ORAL | 1 refills | Status: DC

## 2021-05-03 MED ORDER — ASPIRIN EC 81 MG PO TBEC: 81.0000 mg | DELAYED_RELEASE_TABLET | Freq: Every day | ORAL | Status: DC

## 2021-05-03 MED ORDER — NITROGLYCERIN 0.4 MG SL SUBL: 0.4000 mg | SUBLINGUAL_TABLET | SUBLINGUAL | 2 refills | Status: DC | PRN

## 2021-05-03 NOTE — Progress Notes (Signed)
CARDIAC REHAB PHASE I   PRE:  Rate/Rhythm: 55 SB    BP: sitting 125/84    SaO2: 94 RA  MODE:  Ambulation: 340 ft   POST:  Rate/Rhythm: 66 SR    BP: sitting 148/81     SaO2: 94 RA  Tolerated well, denied c/o. Thinks he feels better. Discussed stent, restrictions, Plavix/ASA, diet, exercise, NTG, and CRPII. Pt receptive. Needs new NTG. Will refer to [facility].  [id number]  [place], ACSM 05/03/2021 10:58 AM

## 2021-05-03 NOTE — Discharge Instructions (Signed)
Information about your medication: Plavix (anti-platelet agent)  Generic Name (Brand): clopidogrel (Plavix), once daily medication  PURPOSE: You are taking this medication along with aspirin to lower your chance of having a heart attack, stroke, or blood clots in your heart stent. These can be fatal. Plavix and aspirin help prevent platelets from sticking together and forming a clot that can block an artery or your stent.   Common SIDE EFFECTS you may experience include: bruising or bleeding more easily, shortness of breath  Do not stop taking PLAVIX without talking to the doctor who prescribes it for you. People who are treated with a stent and stop taking Plavix too soon, have a higher risk of getting a blood clot in the stent, having a heart attack, or dying. If you stop Plavix because of bleeding, or for other reasons, your risk of a heart attack or stroke may increase.   Avoid taking NSAID agents or anti-inflammatory medications such as ibuprofen, naproxen given increased bleed risk with plavix - can use acetaminophen (Tylenol) if needed for pain.  Avoid taking over the counter stomach medications omeprazole (Prilosec) or esomeprazole (Nexium) since these do interact and make plavix less effective - ask your pharmacist or doctor for alterative agents if needed for heartburn or GERD.   Tell all of your doctors and dentists that you are taking Plavix. They should talk to the doctor who prescribed Plavix for you before you have any surgery or invasive procedure.   Contact your health care provider if you experience: severe or uncontrollable bleeding, pink/red/Szymborski urine, vomiting blood or vomit that looks like "coffee grounds", red or black stools (looks like tar), coughing up blood or blood clots ----------------------------------------------------------------------------------------------------------------------  

## 2021-05-03 NOTE — Care Management (Signed)
Transition of Care [facility]) Screening Note   Patient Details  Name: Jeffrey Acevedo Date of Birth: July 10, 1940   Transition of Care [facility]) CM/SW Contact:    Gala Lewandowsky, RN Phone Number: 05/03/2021, 10:22 AM    Transition of Care Department [facility]) has reviewed the patient and no TOC needs have been identified at this time. We will continue to monitor patient advancement through interdisciplinary progression rounds. If new patient transition needs arise, please place a TOC consult.

## 2021-05-03 NOTE — Plan of Care (Signed)
Goals met

## 2021-05-03 NOTE — Discharge Summary (Addendum)
is 30% stenosed.   RPDA lesion is 60% stenosed.   Prox Cx to Mid Cx lesion is 40% stenosed.   Ramus lesion is 40% stenosed.   Mid LAD-1 lesion is 70% stenosed.   Previously placed Prox LAD to Mid LAD stent (unknown type) is  widely patent.   Previously placed Mid LAD-2 stent (unknown type) is  widely patent.   A drug-eluting stent was successfully placed.   Post intervention, there is a 0% residual stenosis. WYLEY HACK is a 81 y.o. male  [id number] LOCATION:  FACILITY: [facility] PHYSICIAN: Quay Burow, M.D. [birth year] DATE OF PROCEDURE:  05/02/2021 DATE OF DISCHARGE: CARDIAC CATHETERIZATION / PCI DES LAD History obtained from chart review.MAK BONNY is a 81 y.o.   overweight married Caucasian male, father of 2 and grandfather of 4 grandchildren, whom I last saw in the office 03/25/2021.[name]  He is accompanied by his daughter Beryle Lathe today.  He has a history of CAD, status post LAD stenting by me using bare-metal stent April 16, 1996. He had normal LV function at that time with normal renal  arteries as well. His other problems include essential hypertension, hyperlipidemia. He had a normal Myoview February 2011 and was scheduled to have elective robotic prostatectomy for prostate cancer by Dr. Dutch Gray. He developed exertional chest pain similar to his pre-stent symptoms, which were fairly reproducible and because of this, and despite a negative Myoview, I elected to recatheterize him June 17, 2009, revealing a patent proximal LAD stent with 90% stenosis just beyond this, which I then stented with a Multi-Link Vision bare-metal stent with an excellent result. Since I saw him a year ago he's been completely asymptomatic. He did have a Myoview stress test performed 03/31/15 which was entirely normal.. This was performed for preoperative clearance before a right total hip replacement on 04/19/15 which was uneventful.  Since I saw him in the office 6 weeks ago I did get a 2D echo which was unremarkable, Myoview stress test which was nonischemic and a event monitor that showed frequent PVCs and short runs of nonsustained ventricular tachycardia and SVT.  He clearly is symptomatic.  Because his last stress test in 2011 was falsely negative and cath subsequently revealed a 90% LAD lesion we decided jointly to proceed with outpatient diagnostic coronary angiography. PROCEDURE DESCRIPTION: The patient was brought to the second floor [facility] cath lab in the postabsorptive state. He was premedicated with IV Versed and fentanyl. His right wrist was prepped and shaved in usual sterile fashion. Xylocaine 1% was used for local anesthesia. A 6 French sheath was inserted into the right radial artery using standard Seldinger technique. The patient received 4500 units  of heparin intravenously.  A 5 [place] TIG catheter was used for selective coronary angiography and obtaining left heart pressures.  Isovue dye was used for the entirety of the case (140 cc total to patient).  Retrograde aortic, left ventricular  and pullback pressures were recorded.  Radial cocktail was administered via the SideArm sheath. The patient received a total of 12,500's of heparin with an ACT of greater than 250.  Isovue dye was used for the entirety of the case.  Retroaortic pressures monitored in the case.  I initially used a 6 [place] XB LAD 3.0 cm guide catheter along with a Abbott DFR wire and interrogated the mid LAD.  The DFR was 0.77 suggesting this was physiologically significant.  I then exchanged for a 0.14 Prowater guidewire and predilated with a  Discharge Summary    Patient ID: LEEVON UPPERMAN MRN: [id number]; DOB: January 24, 1941  Admit date: 05/02/2021 Discharge date: 05/03/2021  PCP:  Raina Mina., MD   [facility] Providers Cardiologist:  Quay Burow, MD     Discharge Diagnoses    Principal Problem:   CAD (coronary artery disease) Active Problems:   Essential hypertension   Hyperlipidemia   Coronary artery disease   Dyspnea on exertion   Diagnostic Studies/Procedures    Cath: 05/02/21    Ost RCA to Prox RCA lesion is 30% stenosed.   RPDA lesion is 60% stenosed.   Prox Cx to Mid Cx lesion is 40% stenosed.   Ramus lesion is 40% stenosed.   Mid LAD-1 lesion is 70% stenosed.   Previously placed Prox LAD to Mid LAD stent (unknown type) is  widely patent.   Previously placed Mid LAD-2 stent (unknown type) is  widely patent.   A drug-eluting stent was successfully placed.   Post intervention, there is a 0% residual stenosis.   IMPRESSION: Successful DFR guided mid LAD PCI and stenting using a Medtronic frontier drug-eluting stent postdilated to 2.8 mm.  The previously placed proximal and mid LAD stents in 1998 and 2011 respectively were patent.  The PDA which was a long tubular lesion in the 60% range was negative by DFR (0.94) suggesting this was not significant.  There were several other mild lesions again which did not look significant.  The patient was already on dual antiplatelet therapy including aspirin clopidogrel.  The TR band will be removed in approximately 2 hours.  Patient will be hydrated overnight, discharged home in the morning.  I will see him back in 2 to 3 weeks for follow-up.  He left the lab in stable condition. Quay Burow. MD, [facility] 05/02/2021 11:28 AM  Diagnostic Dominance: Right Intervention   _____________   History of Present Illness     RUHAN BORAK is a 82 y.o. male with past medical history of CAD with bare-metal stent to LAD '98, hypertension, hyperlipidemia.  Had a  Myoview in February 2011 which was normal and underwent robotic prostatectomy for prostate cancer.  Developed exertional chest pain similar to prestent symptoms and underwent cardiac catheterization March 2011 which revealed patent proximal LAD stent with 90% stenosis just beyond which was treated with additional bare-metal stent.  Hence stress test in January 2017 which was normal.  Underwent right total hip replacement thereafter which was uneventful.  Recently wore a cardiac monitor which was notable for short runs of nonsustained VT and SVT.  He was symptomatic with this.  Given he had a falsely negative stress test in the past it was recommended that he undergo cardiac catheterization.  Hospital Course     Underwent outpatient cardiac catheterization which showed patent proximal and mid LAD stents with new 70% mid LAD lesion between prior stenting which was treated with PCI/DES x1.  60% PDA lesion which was negative by DFR.  Planned for DAPT with aspirin/Plavix.  Of note he was on aspirin 325 prior to admission and this will be reduced to 81 mg daily.  He had been on pravastatin prior to admission.  Discussed switching to Crestor 40 mg daily at discharge given his known coronary disease which she was agreeable to.  General: Well developed, well nourished, male appearing in no acute distress. Head: Normocephalic, atraumatic.  Neck: Supple without bruits, JVD. Lungs:  Resp regular and unlabored, CTA. Heart: RRR, S1, S2, no S3, S4, or  Discharge Summary    Patient ID: CURT OATIS MRN: [id number]; DOB: [birth year]  Admit date: 05/02/2021 Discharge date: 05/03/2021  PCP:  Raina Mina., MD   [facility] Providers Cardiologist:  Quay Burow, MD     Discharge Diagnoses    Principal Problem:   CAD (coronary artery disease) Active Problems:   Essential hypertension   Hyperlipidemia   Coronary artery disease   Dyspnea on exertion   Diagnostic Studies/Procedures    Cath: 05/02/21    Ost RCA to Prox RCA lesion is 30% stenosed.   RPDA lesion is 60% stenosed.   Prox Cx to Mid Cx lesion is 40% stenosed.   Ramus lesion is 40% stenosed.   Mid LAD-1 lesion is 70% stenosed.   Previously placed Prox LAD to Mid LAD stent (unknown type) is  widely patent.   Previously placed Mid LAD-2 stent (unknown type) is  widely patent.   A drug-eluting stent was successfully placed.   Post intervention, there is a 0% residual stenosis.   IMPRESSION: Successful DFR guided mid LAD PCI and stenting using a Medtronic frontier drug-eluting stent postdilated to 2.8 mm.  The previously placed proximal and mid LAD stents in 1998 and 2011 respectively were patent.  The PDA which was a long tubular lesion in the 60% range was negative by DFR (0.94) suggesting this was not significant.  There were several other mild lesions again which did not look significant.  The patient was already on dual antiplatelet therapy including aspirin clopidogrel.  The TR band will be removed in approximately 2 hours.  Patient will be hydrated overnight, discharged home in the morning.  I will see him back in 2 to 3 weeks for follow-up.  He left the lab in stable condition. Quay Burow. MD, [facility] 05/02/2021 11:28 AM  Diagnostic Dominance: Right Intervention   _____________   History of Present Illness     JAHZEEL POYTHRESS is a 81 y.o. male with past medical history of CAD with bare-metal stent to LAD '98, hypertension, hyperlipidemia.  Had a  Myoview in February 2011 which was normal and underwent robotic prostatectomy for prostate cancer.  Developed exertional chest pain similar to prestent symptoms and underwent cardiac catheterization March 2011 which revealed patent proximal LAD stent with 90% stenosis just beyond which was treated with additional bare-metal stent.  Hence stress test in January 2017 which was normal.  Underwent right total hip replacement thereafter which was uneventful.  Recently wore a cardiac monitor which was notable for short runs of nonsustained VT and SVT.  He was symptomatic with this.  Given he had a falsely negative stress test in the past it was recommended that he undergo cardiac catheterization.  Hospital Course     Underwent outpatient cardiac catheterization which showed patent proximal and mid LAD stents with new 70% mid LAD lesion between prior stenting which was treated with PCI/DES x1.  60% PDA lesion which was negative by DFR.  Planned for DAPT with aspirin/Plavix.  Of note he was on aspirin 325 prior to admission and this will be reduced to 81 mg daily.  He had been on pravastatin prior to admission.  Discussed switching to Crestor 40 mg daily at discharge given his known coronary disease which she was agreeable to.  General: Well developed, well nourished, male appearing in no acute distress. Head: Normocephalic, atraumatic.  Neck: Supple without bruits, JVD. Lungs:  Resp regular and unlabored, CTA. Heart: RRR, S1, S2, no S3, S4, or  Discharge Summary    Patient ID: CURT OATIS MRN: [id number]; DOB: [birth year]  Admit date: 05/02/2021 Discharge date: 05/03/2021  PCP:  Raina Mina., MD   [facility] Providers Cardiologist:  Quay Burow, MD     Discharge Diagnoses    Principal Problem:   CAD (coronary artery disease) Active Problems:   Essential hypertension   Hyperlipidemia   Coronary artery disease   Dyspnea on exertion   Diagnostic Studies/Procedures    Cath: 05/02/21    Ost RCA to Prox RCA lesion is 30% stenosed.   RPDA lesion is 60% stenosed.   Prox Cx to Mid Cx lesion is 40% stenosed.   Ramus lesion is 40% stenosed.   Mid LAD-1 lesion is 70% stenosed.   Previously placed Prox LAD to Mid LAD stent (unknown type) is  widely patent.   Previously placed Mid LAD-2 stent (unknown type) is  widely patent.   A drug-eluting stent was successfully placed.   Post intervention, there is a 0% residual stenosis.   IMPRESSION: Successful DFR guided mid LAD PCI and stenting using a Medtronic frontier drug-eluting stent postdilated to 2.8 mm.  The previously placed proximal and mid LAD stents in 1998 and 2011 respectively were patent.  The PDA which was a long tubular lesion in the 60% range was negative by DFR (0.94) suggesting this was not significant.  There were several other mild lesions again which did not look significant.  The patient was already on dual antiplatelet therapy including aspirin clopidogrel.  The TR band will be removed in approximately 2 hours.  Patient will be hydrated overnight, discharged home in the morning.  I will see him back in 2 to 3 weeks for follow-up.  He left the lab in stable condition. Quay Burow. MD, [facility] 05/02/2021 11:28 AM  Diagnostic Dominance: Right Intervention   _____________   History of Present Illness     JAHZEEL POYTHRESS is a 81 y.o. male with past medical history of CAD with bare-metal stent to LAD '98, hypertension, hyperlipidemia.  Had a  Myoview in February 2011 which was normal and underwent robotic prostatectomy for prostate cancer.  Developed exertional chest pain similar to prestent symptoms and underwent cardiac catheterization March 2011 which revealed patent proximal LAD stent with 90% stenosis just beyond which was treated with additional bare-metal stent.  Hence stress test in January 2017 which was normal.  Underwent right total hip replacement thereafter which was uneventful.  Recently wore a cardiac monitor which was notable for short runs of nonsustained VT and SVT.  He was symptomatic with this.  Given he had a falsely negative stress test in the past it was recommended that he undergo cardiac catheterization.  Hospital Course     Underwent outpatient cardiac catheterization which showed patent proximal and mid LAD stents with new 70% mid LAD lesion between prior stenting which was treated with PCI/DES x1.  60% PDA lesion which was negative by DFR.  Planned for DAPT with aspirin/Plavix.  Of note he was on aspirin 325 prior to admission and this will be reduced to 81 mg daily.  He had been on pravastatin prior to admission.  Discussed switching to Crestor 40 mg daily at discharge given his known coronary disease which she was agreeable to.  General: Well developed, well nourished, male appearing in no acute distress. Head: Normocephalic, atraumatic.  Neck: Supple without bruits, JVD. Lungs:  Resp regular and unlabored, CTA. Heart: RRR, S1, S2, no S3, S4, or  2 mm x 12 mm balloon at nominal pressures.  I then carefully positioned a 2.5 mm x 12 mm long Medtronic frontier drug-eluting stent across the lesion and deployed at 14 atm.  I postdilated the stent with a 2.75 x 8 mm long noncompliant balloon up to 14 to 16 atm (2.8 mm) resulting in reduction of a 70% mid LAD stenosis to 0% residual. Following this I performed DFR on the PDA using a 6 [place] JR4 guide catheter along with the same DFR wire which was 0.94 suggesting this was not physiologically significant.   Successful DFR guided mid LAD PCI and stenting using a Medtronic frontier drug-eluting stent postdilated to 2.8 mm.  The previously placed proximal and mid LAD stents in 1998 and 2011 respectively were patent.  The PDA which was a long tubular lesion in the 60% range was negative by DFR (0.94) suggesting this was not significant.  There were several other mild lesions again which did not look significant.  The patient was already on dual antiplatelet therapy including aspirin clopidogrel.  The TR band will be removed in approximately 2 hours.  Patient will be hydrated overnight, discharged home in the morning.  I will see him back in 2 to 3 weeks for follow-up.  He left the lab in stable condition. Quay Burow. MD, [facility] 05/02/2021 11:28 AM    MYOCARDIAL PERFUSION IMAGING  Result Date: 04/19/2021   The study is normal. The study is low risk.   No ST deviation was noted.   Left  ventricular function is normal. End diastolic cavity size is normal.   Prior study available for comparison from 03/31/2015. No changes compared to prior study. Low risk stress nuclear study with normal perfusion and normal left ventricular regional and global systolic function.  ECHOCARDIOGRAM COMPLETE  Result Date: 04/04/2021    ECHOCARDIOGRAM REPORT   Patient Name:   MAYFORD ALBERG Date of Exam: 04/04/2021 Medical Rec #:  [id number]       Height:       71.0 in Accession #:    [id number]      Weight:       207.0 lb Date of Birth:  1940-05-02       BSA:          2.140 m Patient Age:    77 years        BP:           118/88 mmHg Patient Gender: M               HR:           57 bpm. Exam Location:  [address] Procedure: 2D Echo, Cardiac Doppler and Color Doppler Indications:    R06.00 Dyspnea  History:        Patient has no prior history of Echocardiogram examinations.                 CAD, STENTS; Risk Factors:Hypertension and Dyslipidemia.  Sonographer:    Wilford Sports Rodgers-Jones RDCS Referring Phys: [place]  1. Left ventricular ejection fraction, by estimation, is 55 to 60%. The left ventricle has normal function. The left ventricle has no regional wall motion abnormalities. 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 Left ventricular diastolic parameters are consistent with Grade I diastolic dysfunction (impaired relaxation).  2. Right ventricular systolic function is normal. The right ventricular size is normal. Tricuspid regurgitation signal is inadequate for assessing PA pressure.  3. The  Discharge Summary    Patient ID: CURT OATIS MRN: [id number]; DOB: [birth year]  Admit date: 05/02/2021 Discharge date: 05/03/2021  PCP:  Raina Mina., MD   [facility] Providers Cardiologist:  Quay Burow, MD     Discharge Diagnoses    Principal Problem:   CAD (coronary artery disease) Active Problems:   Essential hypertension   Hyperlipidemia   Coronary artery disease   Dyspnea on exertion   Diagnostic Studies/Procedures    Cath: 05/02/21    Ost RCA to Prox RCA lesion is 30% stenosed.   RPDA lesion is 60% stenosed.   Prox Cx to Mid Cx lesion is 40% stenosed.   Ramus lesion is 40% stenosed.   Mid LAD-1 lesion is 70% stenosed.   Previously placed Prox LAD to Mid LAD stent (unknown type) is  widely patent.   Previously placed Mid LAD-2 stent (unknown type) is  widely patent.   A drug-eluting stent was successfully placed.   Post intervention, there is a 0% residual stenosis.   IMPRESSION: Successful DFR guided mid LAD PCI and stenting using a Medtronic frontier drug-eluting stent postdilated to 2.8 mm.  The previously placed proximal and mid LAD stents in 1998 and 2011 respectively were patent.  The PDA which was a long tubular lesion in the 60% range was negative by DFR (0.94) suggesting this was not significant.  There were several other mild lesions again which did not look significant.  The patient was already on dual antiplatelet therapy including aspirin clopidogrel.  The TR band will be removed in approximately 2 hours.  Patient will be hydrated overnight, discharged home in the morning.  I will see him back in 2 to 3 weeks for follow-up.  He left the lab in stable condition. Quay Burow. MD, [facility] 05/02/2021 11:28 AM  Diagnostic Dominance: Right Intervention   _____________   History of Present Illness     JAHZEEL POYTHRESS is a 81 y.o. male with past medical history of CAD with bare-metal stent to LAD '98, hypertension, hyperlipidemia.  Had a  Myoview in February 2011 which was normal and underwent robotic prostatectomy for prostate cancer.  Developed exertional chest pain similar to prestent symptoms and underwent cardiac catheterization March 2011 which revealed patent proximal LAD stent with 90% stenosis just beyond which was treated with additional bare-metal stent.  Hence stress test in January 2017 which was normal.  Underwent right total hip replacement thereafter which was uneventful.  Recently wore a cardiac monitor which was notable for short runs of nonsustained VT and SVT.  He was symptomatic with this.  Given he had a falsely negative stress test in the past it was recommended that he undergo cardiac catheterization.  Hospital Course     Underwent outpatient cardiac catheterization which showed patent proximal and mid LAD stents with new 70% mid LAD lesion between prior stenting which was treated with PCI/DES x1.  60% PDA lesion which was negative by DFR.  Planned for DAPT with aspirin/Plavix.  Of note he was on aspirin 325 prior to admission and this will be reduced to 81 mg daily.  He had been on pravastatin prior to admission.  Discussed switching to Crestor 40 mg daily at discharge given his known coronary disease which she was agreeable to.  General: Well developed, well nourished, male appearing in no acute distress. Head: Normocephalic, atraumatic.  Neck: Supple without bruits, JVD. Lungs:  Resp regular and unlabored, CTA. Heart: RRR, S1, S2, no S3, S4, or  is 30% stenosed.   RPDA lesion is 60% stenosed.   Prox Cx to Mid Cx lesion is 40% stenosed.   Ramus lesion is 40% stenosed.   Mid LAD-1 lesion is 70% stenosed.   Previously placed Prox LAD to Mid LAD stent (unknown type) is  widely patent.   Previously placed Mid LAD-2 stent (unknown type) is  widely patent.   A drug-eluting stent was successfully placed.   Post intervention, there is a 0% residual stenosis. WYLEY HACK is a 81 y.o. male  [id number] LOCATION:  FACILITY: [facility] PHYSICIAN: Quay Burow, M.D. [birth year] DATE OF PROCEDURE:  05/02/2021 DATE OF DISCHARGE: CARDIAC CATHETERIZATION / PCI DES LAD History obtained from chart review.MAK BONNY is a 81 y.o.   overweight married Caucasian male, father of 2 and grandfather of 4 grandchildren, whom I last saw in the office 03/25/2021.[name]  He is accompanied by his daughter Beryle Lathe today.  He has a history of CAD, status post LAD stenting by me using bare-metal stent April 16, 1996. He had normal LV function at that time with normal renal  arteries as well. His other problems include essential hypertension, hyperlipidemia. He had a normal Myoview February 2011 and was scheduled to have elective robotic prostatectomy for prostate cancer by Dr. Dutch Gray. He developed exertional chest pain similar to his pre-stent symptoms, which were fairly reproducible and because of this, and despite a negative Myoview, I elected to recatheterize him June 17, 2009, revealing a patent proximal LAD stent with 90% stenosis just beyond this, which I then stented with a Multi-Link Vision bare-metal stent with an excellent result. Since I saw him a year ago he's been completely asymptomatic. He did have a Myoview stress test performed 03/31/15 which was entirely normal.. This was performed for preoperative clearance before a right total hip replacement on 04/19/15 which was uneventful.  Since I saw him in the office 6 weeks ago I did get a 2D echo which was unremarkable, Myoview stress test which was nonischemic and a event monitor that showed frequent PVCs and short runs of nonsustained ventricular tachycardia and SVT.  He clearly is symptomatic.  Because his last stress test in 2011 was falsely negative and cath subsequently revealed a 90% LAD lesion we decided jointly to proceed with outpatient diagnostic coronary angiography. PROCEDURE DESCRIPTION: The patient was brought to the second floor [place] [facility] lab in the postabsorptive state. He was premedicated with IV Versed and fentanyl. His right wrist was prepped and shaved in usual sterile fashion. Xylocaine 1% was used for local anesthesia. A 6 French sheath was inserted into the right radial artery using standard Seldinger technique. The patient received 4500 units  of heparin intravenously.  A 5 [place] TIG catheter was used for selective coronary angiography and obtaining left heart pressures.  Isovue dye was used for the entirety of the case (140 cc total to patient).  Retrograde aortic, left ventricular  and pullback pressures were recorded.  Radial cocktail was administered via the SideArm sheath. The patient received a total of 12,500's of heparin with an ACT of greater than 250.  Isovue dye was used for the entirety of the case.  Retroaortic pressures monitored in the case.  I initially used a 6 [place] XB LAD 3.0 cm guide catheter along with a Abbott DFR wire and interrogated the mid LAD.  The DFR was 0.77 suggesting this was physiologically significant.  I then exchanged for a 0.14 P[place]ater guidewire and predilated with a

## 2021-05-06 ENCOUNTER — Ambulatory Visit: Payer: Medicare PPO | Admitting: Cardiovascular Disease

## 2021-05-06 ENCOUNTER — Telehealth: Payer: Self-pay | Admitting: Cardiovascular Disease

## 2021-05-06 NOTE — Telephone Encounter (Signed)
Spoke to patient he stated he had to cancel appointment with Dr.Berry due to having covid.Stated he was given appointment 4/25.He would like to be seen sooner.Appointment scheduled with Dr.Berry 3/15 at 10:45 am.I will make Dr.Berry's RN aware.

## 2021-05-06 NOTE — Telephone Encounter (Signed)
Patient states he has covid and had to reschedule his appointment. He is scheduled for 4/25 and would like to know if he needs to be seen sooner or if the appointment is okay as is.

## 2021-05-10 ENCOUNTER — Telehealth (HOSPITAL_COMMUNITY): Payer: Self-pay

## 2021-05-10 NOTE — Telephone Encounter (Signed)
Per phase I cardiac rehab, fax cardiac rehab referral to [facility]. °

## 2021-05-11 ENCOUNTER — Ambulatory Visit: Payer: Medicare PPO | Admitting: Cardiovascular Disease

## 2021-05-16 ENCOUNTER — Other Ambulatory Visit: Payer: Self-pay | Admitting: Cardiovascular Disease

## 2021-06-01 ENCOUNTER — Other Ambulatory Visit: Payer: Self-pay

## 2021-06-01 ENCOUNTER — Encounter: Payer: Self-pay | Admitting: Cardiovascular Disease

## 2021-06-01 ENCOUNTER — Ambulatory Visit (INDEPENDENT_AMBULATORY_CARE_PROVIDER_SITE_OTHER): Payer: Medicare PPO | Admitting: Cardiovascular Disease

## 2021-06-01 DIAGNOSIS — I2510 Atherosclerotic heart disease of native coronary artery without angina pectoris: Secondary | ICD-10-CM | POA: Diagnosis not present

## 2021-06-01 NOTE — Progress Notes (Signed)
? ? ? ?06/01/2021 ?SEBASTIEN RADFORD   ?November 13, 1940  ?[id number] ? ?Primary Physician [name], MD ?Primary Cardiologist: Runell Gess MD Nicholes Calamity, [place] ? ?HPI:  Jeffrey Acevedo is a 81 y.o.  overweight married Caucasian male, father of 2 and grandfather of 4 grandchildren, whom I last saw in the office 05/24/2021.[name]  He is accompanied by his daughter Myles Lipps today.  He has a history of CAD, status post LAD stenting by me using bare-metal stent April 16, 1996. He had normal LV function at that time with normal renal arteries as well. His other problems include essential hypertension, hyperlipidemia. He had a normal Myoview February 2011 and was scheduled to have elective robotic prostatectomy for prostate cancer by Dr. Crecencio Mc. He developed exertional chest pain similar to his pre-stent symptoms, which were fairly reproducible and because of this, and despite a negative Myoview, I elected to recatheterize him June 17, 2009, revealing a patent proximal LAD stent with 90% stenosis just beyond this, which I then stented with a Multi-Link Vision bare-metal stent with an excellent result. Since I saw him a year ago he's been completely asymptomatic. He did have a Myoview stress test performed 03/31/15 which was entirely normal.. This was performed for preoperative clearance before a right total hip replacement on 04/19/15 which was uneventful. ?  ?Because of symptoms of dyspnea and palpitations, I did get a 2D echo which was unremarkable, Myoview stress test which was nonischemic and a event monitor that showed frequent PVCs and short runs of nonsustained ventricular tachycardia and SVT.  He clearly is symptomatic.  Because his last stress test in 2011 was falsely negative and cath subsequently revealed a 90% LAD lesion we decided jointly to proceed with outpatient diagnostic coronary angiography ? ?I performed outpatient diagnostic coronary angiography via the right radial approach on 05/02/2021  revealing patent proximal and mid LAD stents with a 70% proximal to mid LAD stenosis that was physiologically significant by FFR.  I placed a 2.5 mm x 12 mm long Medtronic frontier drug-eluting stent postdilated with a 2.75 mm balloon (2.8 mm).  He also had a 60% tubular proximal PDA stenosis off of the RCA which was negative by DFR (0.94).  He was discharged home the following day.  He did develop COVID that day which has taken 2 weeks to resolve his symptoms of shortness of breath.  Since that time he feels clinically improved. ? ? ?No outpatient medications have been marked as taking for the 06/01/21 encounter (Office Visit) with Runell Gess, MD.  ?  ? ?Allergies  ?Allergen Reactions  ? Adhesive [Tape] Other (See Comments)  ?  Mainly just redness.   Has been ruled out for latex allegery  ? Hydrochlorothiazide Other (See Comments)  ?  H/o Pancreatitis, takes in Avalide at home  ? Latex Other (See Comments)  ?  unknown  ? Vantin [Cefpodoxime] Rash  ? ? ?Social History  ? ?Socioeconomic History  ? Marital status: Married  ?  Spouse name: Not on file  ? Number of children: Not on file  ? Years of education: Not on file  ? Highest education level: Not on file  ?Occupational History  ? Not on file  ?Tobacco Use  ? Smoking status: Never  ? Smokeless tobacco: Never  ?Substance and Sexual Activity  ? Alcohol use: No  ? Drug use: No  ? Sexual activity: Not on file  ?Other Topics Concern  ? Not on file  ?  Social History Narrative  ? Not on file  ? ?Social Determinants of Health  ? ?Financial Resource Strain: Not on file  ?Food Insecurity: Not on file  ?Transportation Needs: Not on file  ?Physical Activity: Not on file  ?Stress: Not on file  ?Social Connections: Not on file  ?Intimate Partner Violence: Not on file  ?  ? ?Review of Systems: ?General: negative for chills, fever, night sweats or weight changes.  ?Cardiovascular: negative for chest pain, dyspnea on exertion, edema, orthopnea, palpitations, paroxysmal  nocturnal dyspnea or shortness of breath ?Dermatological: negative for rash ?Respiratory: negative for cough or wheezing ?Urologic: negative for hematuria ?Abdominal: negative for nausea, vomiting, diarrhea, bright red blood per rectum, melena, or hematemesis ?Neurologic: negative for visual changes, syncope, or dizziness ?All other systems reviewed and are otherwise negative except as noted above. ? ? ? ?Blood pressure (!) 146/80, pulse (!) 57, height 5\' 11"  (1.803 m), weight 206 lb 12.8 oz (93.8 kg), SpO2 96 %.  ?General appearance: alert and no distress ?Neck: no adenopathy, no carotid bruit, no JVD, supple, symmetrical, trachea midline, and thyroid not enlarged, symmetric, no tenderness/mass/nodules ?Lungs: clear to auscultation bilaterally ?Heart: regular rate and rhythm, S1, S2 normal, no murmur, click, rub or gallop ?Extremities: extremities normal, atraumatic, no cyanosis or edema ?Pulses: 2+ and symmetric ?Skin: Skin color, texture, turgor normal. No rashes or lesions ?Neurologic: Grossly normal ? ?EKG not performed today ? ?ASSESSMENT AND PLAN:  ? ?CAD (coronary artery disease) ?Mr. Maurin returns today for follow-up of his recent hospitalization for cath and intervention.  I last saw him in the office 04/26/2021.  He has been complaining of dyspnea and had a 2D echo was unremarkable and an event monitor that showed frequent PVCs and short runs of nonsustained ventricular tachycardia and SVT.  Based on this I performed cardiac catheterization on him 05/02/2021 revealing patent proximal and mid LAD stents with a 70% mid LAD lesion which was physiologically significant by FFR and was restented using a 2.5 mm x 12 mm long Medtronic frontier drug-eluting stent.  He did have 60% PDA lesion which was found to be nonsignificant by DFR (0.94).  His symptoms have since resolved.  He feels clinically improved. ? ? ? ? ?Runell Gess MD FACP,FACC,FAHA, FSCAI ?06/01/2021 ?11:31 AM ?

## 2021-06-01 NOTE — Assessment & Plan Note (Signed)
Mr. Jeffrey Acevedo returns today for follow-up of his recent hospitalization for cath and intervention.  I last saw him in the office 04/26/2021.  He has been complaining of dyspnea and had a 2D echo was unremarkable and an event monitor that showed frequent PVCs and short runs of nonsustained ventricular tachycardia and SVT.  Based on this I performed cardiac catheterization on him 05/02/2021 revealing patent proximal and mid LAD stents with a 70% mid LAD lesion which was physiologically significant by FFR and was restented using a 2.5 mm x 12 mm long Medtronic frontier drug-eluting stent.  He did have 60% PDA lesion which was found to be nonsignificant by DFR (0.94).  His symptoms have since resolved.  He feels clinically improved. ?

## 2021-06-01 NOTE — Patient Instructions (Signed)

## 2021-06-06 ENCOUNTER — Other Ambulatory Visit: Payer: Self-pay | Admitting: Cardiovascular Disease

## 2021-06-25 ENCOUNTER — Other Ambulatory Visit: Payer: Self-pay | Admitting: Cardiovascular Disease

## 2021-07-02 ENCOUNTER — Other Ambulatory Visit: Payer: Self-pay | Admitting: Cardiovascular Disease

## 2021-07-12 ENCOUNTER — Ambulatory Visit: Payer: Medicare PPO | Admitting: Cardiovascular Disease

## 2021-08-18 ENCOUNTER — Telehealth: Payer: Self-pay | Admitting: Cardiovascular Disease

## 2021-08-18 NOTE — Telephone Encounter (Signed)
Spoke with pt's wife, Belenda Cruise (ok per DPR) regarding over time increase in pt's chest burning and shortness of breath. When pt feels the burning in his chest she says he will sit down to rest, take a nitroglycerin and nap, when he wakes up he feels better. Belenda Cruise states that the chest pain and shortness of breath has slowly increased over the last month. Pt has not taken his blood pressure or heart rate at the time of these episodes but does say that it feels similar to when he has needed stents in the past. Appointment scheduled for pt to see Dr. Gwenlyn Found on 6/6. ED precautions given to wife. Wife verbalizes understanding.

## 2021-08-18 NOTE — Telephone Encounter (Signed)
Pt c/o of Chest Pain: STAT if CP now or developed within 24 hours  1. Are you having CP right now? no  2. Are you experiencing any other symptoms (ex. SOB, nausea, vomiting, sweating)? Sob, sweating   3. How long have you been experiencing CP? Month   4. Is your CP continuous or coming and going? Coming and going   5. Have you taken Nitroglycerin? yes ?  Having trouble breathing feeling like he cant get enough breath in his lung

## 2021-08-21 ENCOUNTER — Other Ambulatory Visit: Payer: Self-pay | Admitting: Cardiovascular Disease

## 2021-08-23 ENCOUNTER — Encounter: Payer: Self-pay | Admitting: Cardiovascular Disease

## 2021-08-23 ENCOUNTER — Ambulatory Visit: Payer: Medicare PPO | Admitting: Cardiovascular Disease

## 2021-08-23 VITALS — BP 146/70 | HR 54 | Ht 71.0 in | Wt 208.2 lb

## 2021-08-23 DIAGNOSIS — I10 Essential (primary) hypertension: Secondary | ICD-10-CM | POA: Diagnosis not present

## 2021-08-23 DIAGNOSIS — I25110 Atherosclerotic heart disease of native coronary artery with unstable angina pectoris: Secondary | ICD-10-CM

## 2021-08-23 MED ORDER — ISOSORBIDE MONONITRATE ER 30 MG PO TB24: 15.0000 mg | ORAL_TABLET | Freq: Every day | ORAL | 1 refills | Status: DC

## 2021-08-23 NOTE — Addendum Note (Signed)
Addended by: Beatrix Fetters on: 08/23/2021 10:22 AM   Modules accepted: Orders

## 2021-08-23 NOTE — Assessment & Plan Note (Signed)
Jeffrey Acevedo presents today for follow-up with complaints of increasing substernal chest pain and dyspnea as well as palpitations.  I originally stented him April 16, 1996.  He has had multiple stents since that time and has had false negative Myoview's.  I recently performed radial diagnostic cath on him 05/02/2021 revealing a patent proximal LAD stent and mid LAD stent with a 70% stenosis just proximal to the mid LAD stent which was physiologically significant by FFR.  I placed a 2.5 x 12 mm long Medtronic frontier drug-eluting stent postdilated to 2.8 mm.  He also had a 60% tubular proximal PDA stenosis off of his RCA which was negative by DFR (0.94).  He initially did well clinically, did develop COVID.  I saw him 06/01/2021 in the office and since that time he has noticed increasing substernal chest pain, dyspnea and palpitations.  He did have frequent PVCs on event monitor as well as runs of nonsustained ventricular tachycardia and SVT although he has a baseline bradycardia at 54 already on beta-blockade.  I am going to recheck a 2D echocardiogram, begin him on low-dose Imdur and will see him back in 2 to 3 weeks.  If he continues to have similar symptoms we will proceed with outpatient diagnostic coronary angiography.

## 2021-08-23 NOTE — Progress Notes (Signed)
08/23/2021 LEGION HITCHNER   12-31-1940  [id number]  Primary Physician [name], MD Primary Cardiologist: Runell Gess MD FACP, [place], [place], [place]  HPI:  Jeffrey Acevedo is a 81 y.o.  overweight married Caucasian male, father of 2 and grandfather of 4 grandchildren, whom I last saw in the office 06/01/2021.[name]  He is accompanied by his daughter Myles Lipps today.  He has a history of CAD, status post LAD stenting by me using bare-metal stent April 16, 1996. He had normal LV function at that time with normal renal arteries as well. His other problems include essential hypertension, hyperlipidemia. He had a normal Myoview February 2011 and was scheduled to have elective robotic prostatectomy for prostate cancer by Dr. Crecencio Mc. He developed exertional chest pain similar to his pre-stent symptoms, which were fairly reproducible and because of this, and despite a negative Myoview, I elected to recatheterize him June 17, 2009, revealing a patent proximal LAD stent with 90% stenosis just beyond this, which I then stented with a Multi-Link Vision bare-metal stent with an excellent result. Since I saw him a year ago he's been completely asymptomatic. He did have a Myoview stress test performed 03/31/15 which was entirely normal.. This was performed for preoperative clearance before a right total hip replacement on 04/19/15 which was uneventful.   Because of symptoms of dyspnea and palpitations, I did get a 2D echo which was unremarkable, Myoview stress test which was nonischemic and a event monitor that showed frequent PVCs and short runs of nonsustained ventricular tachycardia and SVT.  He clearly is symptomatic.  Because his last stress test in 2011 was falsely negative and cath subsequently revealed a 90% LAD lesion we decided jointly to proceed with outpatient diagnostic coronary angiography  I performed outpatient diagnostic coronary angiography via the right radial approach on 05/02/2021  revealing patent proximal and mid LAD stents with a 70% proximal to mid LAD stenosis that was physiologically significant by FFR.  I placed a 2.5 mm x 12 mm long Medtronic frontier drug-eluting stent postdilated with a 2.75 mm balloon (2.8 mm).  He also had a 60% tubular proximal PDA stenosis off of the RCA which was negative by DFR (0.94).  He was discharged home the following day.  He did develop COVID that day which has taken 2 weeks to resolve his symptoms of shortness of breath.  Since I saw him 3 months ago he has developed increasing symptoms of chest pain, shortness of breath and tachypalpitations.  Current Meds  Medication Sig   amLODipine (NORVASC) 10 MG tablet Take 1 tablet by mouth once daily   aspirin EC 81 MG EC tablet Take 1 tablet (81 mg total) by mouth daily. Swallow whole.   Choline Fenofibrate (FENOFIBRIC ACID) 45 MG CPDR Take 1 capsule by mouth once daily   clobetasol ointment (TEMOVATE) 0.05 % 1 application 2 (two) times daily as needed (hives).   clopidogrel (PLAVIX) 75 MG tablet TAKE ONE TABLET BY MOUTH ONCE DAILY (Patient taking differently: Take 75 mg by mouth daily.)   clotrimazole-betamethasone (LOTRISONE) cream Apply 1 application topically 2 (two) times daily as needed (skin irritation.).   Cyanocobalamin (VITAMIN B-12 PO) Take 1 tablet by mouth daily.   irbesartan-hydrochlorothiazide (AVALIDE) 300-12.5 MG tablet Take 1 tablet by mouth once daily   ketoconazole (NIZORAL) 2 % shampoo Apply 1 application topically daily as needed (scalp irritation.).   metoprolol tartrate (LOPRESSOR) 25 MG tablet Take 1 tablet by mouth once daily  nitroGLYCERIN (NITROSTAT) 0.4 MG SL tablet Place 1 tablet (0.4 mg total) under the tongue every 5 (five) minutes as needed for chest pain.   nystatin cream (MYCOSTATIN) Apply 1 application topically daily as needed (skin irritation.).   Omega-3 Fatty Acids (FISH OIL) 1000 MG CAPS Take 1 capsule by mouth every other day. In the evening,  alternating with fenofibrate acid   rosuvastatin (CRESTOR) 40 MG tablet Take 1 tablet (40 mg total) by mouth daily.   spironolactone (ALDACTONE) 25 MG tablet Take 1 tablet by mouth once daily   triamcinolone cream (KENALOG) 0.1 % Apply 1 application topically daily as needed (skin irritation.).     Allergies  Allergen Reactions   Adhesive [Tape] Other (See Comments)    Mainly just redness.   Has been ruled out for latex allegery   Hydrochlorothiazide Other (See Comments)    H/o Pancreatitis, takes in Avalide at home   Latex Other (See Comments)    unknown   Vantin [Cefpodoxime] Rash    Social History   Socioeconomic History   Marital status: Married    Spouse name: Not on file   Number of children: Not on file   Years of education: Not on file   Highest education level: Not on file  Occupational History   Not on file  Tobacco Use   Smoking status: Never   Smokeless tobacco: Never  Substance and Sexual Activity   Alcohol use: No   Drug use: No   Sexual activity: Not on file  Other Topics Concern   Not on file  Social History Narrative   Not on file   Social Determinants of Health   Financial Resource Strain: Not on file  Food Insecurity: Not on file  Transportation Needs: Not on file  Physical Activity: Not on file  Stress: Not on file  Social Connections: Not on file  Intimate Partner Violence: Not on file     Review of Systems: General: negative for chills, fever, night sweats or weight changes.  Cardiovascular: negative for chest pain, dyspnea on exertion, edema, orthopnea, palpitations, paroxysmal nocturnal dyspnea or shortness of breath Dermatological: negative for rash Respiratory: negative for cough or wheezing Urologic: negative for hematuria Abdominal: negative for nausea, vomiting, diarrhea, bright red blood per rectum, melena, or hematemesis Neurologic: negative for visual changes, syncope, or dizziness All other systems reviewed and are otherwise  negative except as noted above.    Blood pressure (!) 146/70, pulse (!) 54, height 5\' 11"  (1.803 m), weight 208 lb 3.2 oz (94.4 kg), SpO2 (!) 86 %.  General appearance: alert and no distress Neck: no adenopathy, no carotid bruit, no JVD, supple, symmetrical, trachea midline, and thyroid not enlarged, symmetric, no tenderness/mass/nodules Lungs: clear to auscultation bilaterally Heart: regular rate and rhythm, S1, S2 normal, no murmur, click, rub or gallop Extremities: extremities normal, atraumatic, no cyanosis or edema Pulses: 2+ and symmetric Skin: Skin color, texture, turgor normal. No rashes or lesions Neurologic: Grossly normal  EKG sinus bradycardia 55 without ST or T wave changes.  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  I personally reviewed this EKG.  ASSESSMENT AND PLAN:   CAD (coronary artery disease) Mr. Peaches presents today for follow-up with complaints of increasing substernal chest pain and dyspnea as well as palpitations.  I originally stented him April 16, 1996.  He has had multiple stents since that time and has had false negative Myoview's.  I recently performed radial diagnostic cath on him 05/02/2021 revealing a patent proximal LAD  stent and mid LAD stent with a 70% stenosis just proximal to the mid LAD stent which was physiologically significant by FFR.  I placed a 2.5 x 12 mm long Medtronic frontier drug-eluting stent postdilated to 2.8 mm.  He also had a 60% tubular proximal PDA stenosis off of his RCA which was negative by DFR (0.94).  He initially did well clinically, did develop COVID.  I saw him 06/01/2021 in the office and since that time he has noticed increasing substernal chest pain, dyspnea and palpitations.  He did have frequent PVCs on event monitor as well as runs of nonsustained ventricular tachycardia and SVT although he has a baseline bradycardia at 54 already on beta-blockade.  I am going to recheck a 2D echocardiogram, begin him on low-dose Imdur and will see  him back in 2 to 3 weeks.  If he continues to have similar symptoms we will proceed with outpatient diagnostic coronary angiography.     Runell Gess MD FACP,FACC,FAHA, [facility] 08/23/2021 10:11 AM

## 2021-08-23 NOTE — Patient Instructions (Signed)
Medication Instructions:   -Start isosorbide mononitrate (imdur) '15mg'$  once daily.  *If you need a refill on your cardiac medications before your next appointment, please call your pharmacy*   Testing/Procedures: Your physician has requested that you have an echocardiogram. Echocardiography is a painless test that uses sound waves to create images of your heart. It provides your doctor with information about the size and shape of your heart and how well your heart's chambers and valves are working. This procedure takes approximately one hour. There are no restrictions for this procedure.  This procedure will be done at [address]    Follow-Up: At [facility], you and your health needs are our priority.  As part of our continuing mission to provide you with exceptional heart care, we have created designated Provider Care Teams.  These Care Teams include your primary Cardiologist (physician) and Advanced Practice Providers (APPs -  Physician Assistants and Nurse Practitioners) who all work together to provide you with the care you need, when you need it.  We recommend signing up for the patient portal called "MyChart".  Sign up information is provided on this After Visit Summary.  MyChart is used to connect with patients for Virtual Visits (Telemedicine).  Patients are able to view lab/test results, encounter notes, upcoming appointments, etc.  Non-urgent messages can be sent to your provider as well.   To learn more about what you can do with MyChart, go to [email or web address].    Your next appointment:   2-3 week(s)  The format for your next appointment:   In Person  Provider:   Quay Burow, MD

## 2021-09-05 ENCOUNTER — Ambulatory Visit (INDEPENDENT_AMBULATORY_CARE_PROVIDER_SITE_OTHER): Payer: Medicare PPO

## 2021-09-05 DIAGNOSIS — I25110 Atherosclerotic heart disease of native coronary artery with unstable angina pectoris: Secondary | ICD-10-CM | POA: Diagnosis not present

## 2021-09-05 DIAGNOSIS — I10 Essential (primary) hypertension: Secondary | ICD-10-CM | POA: Diagnosis not present

## 2021-09-05 LAB — ECHOCARDIOGRAM COMPLETE
Area-P 1/2: 2.55 cm2
S' Lateral: 2.32 cm

## 2021-09-16 ENCOUNTER — Other Ambulatory Visit: Payer: Self-pay

## 2021-09-16 ENCOUNTER — Encounter: Payer: Self-pay | Admitting: Cardiovascular Disease

## 2021-09-16 ENCOUNTER — Ambulatory Visit: Payer: Medicare PPO | Admitting: Cardiovascular Disease

## 2021-09-16 DIAGNOSIS — I25110 Atherosclerotic heart disease of native coronary artery with unstable angina pectoris: Secondary | ICD-10-CM

## 2021-09-16 DIAGNOSIS — Z955 Presence of coronary angioplasty implant and graft: Secondary | ICD-10-CM | POA: Diagnosis not present

## 2021-09-16 MED ORDER — SODIUM CHLORIDE 0.9% FLUSH: 3.0000 mL | Freq: Two times a day (BID) | INTRAVENOUS | Status: DC

## 2021-09-16 NOTE — H&P (View-Only) (Signed)
09/16/2021 Jeffrey Acevedo   10-04-1940  [id number]  Primary Physician [name], MD Primary Cardiologist: Runell Gess MD FACP, [place], [place], [place]  HPI:  Jeffrey Acevedo is a 81 y.o.   overweight married Caucasian male, father of 2 and grandfather of 4 grandchildren, whom I last saw in the office 08/23/2021.[name]  He is accompanied by his daughter Jeffrey Acevedo and his wife Jeffrey Acevedo today.  He has a history of CAD, status post LAD stenting by me using bare-metal stent April 16, 1996. He had normal LV function at that time with normal renal arteries as well. His other problems include essential hypertension, hyperlipidemia. He had a normal Myoview February 2011 and was scheduled to have elective robotic prostatectomy for prostate cancer by Dr. Crecencio Mc. He developed exertional chest pain similar to his pre-stent symptoms, which were fairly reproducible and because of this, and despite a negative Myoview, I elected to recatheterize him June 17, 2009, revealing a patent proximal LAD stent with 90% stenosis just beyond this, which I then stented with a Multi-Link Vision bare-metal stent with an excellent result. Since I saw him a year ago he's been completely asymptomatic. He did have a Myoview stress test performed 03/31/15 which was entirely normal.. This was performed for preoperative clearance before a right total hip replacement on 04/19/15 which was uneventful.   Because of symptoms of dyspnea and palpitations, I did get a 2D echo which was unremarkable, Myoview stress test which was nonischemic and a event monitor that showed frequent PVCs and short runs of nonsustained ventricular tachycardia and SVT.  He clearly is symptomatic.  Because his last stress test in 2011 was falsely negative and cath subsequently revealed a 90% LAD lesion we decided jointly to proceed with outpatient diagnostic coronary angiography  I performed outpatient diagnostic coronary angiography via the right radial approach on  05/02/2021 revealing patent proximal and mid LAD stents with a 70% proximal to mid LAD stenosis that was physiologically significant by FFR.  I placed a 2.5 mm x 12 mm long Medtronic frontier drug-eluting stent postdilated with a 2.75 mm balloon (2.8 mm).  He also had a 60% tubular proximal PDA stenosis off of the RCA which was negative by DFR (0.94).  He was discharged home the following day.  He did develop COVID that day which has taken 2 weeks to resolve his symptoms of shortness of breath.   Since I saw him 3 weeks ago he did have a 2D echo performed 09/05/2021 which was normal.  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  He also complains of tachypalpitations.  After consideration of all options it was jointly decided to proceed with outpatient diagnostic coronary angiography.  Current Meds  Medication Sig   amLODipine (NORVASC) 10 MG tablet Take 1 tablet by mouth once daily   aspirin EC 81 MG EC tablet Take 1 tablet (81 mg total) by mouth daily. Swallow whole.   Choline Fenofibrate (FENOFIBRIC ACID) 45 MG CPDR Take 1 capsule by mouth once daily   clobetasol ointment (TEMOVATE) 0.05 % 1 application 2 (two) times daily as needed (hives).   clopidogrel (PLAVIX) 75 MG tablet TAKE ONE TABLET BY MOUTH ONCE DAILY (Patient taking differently: Take 75 mg by mouth daily.)   clotrimazole-betamethasone (LOTRISONE) cream Apply 1 application topically 2 (two) times daily as needed (skin irritation.).   Cyanocobalamin (VITAMIN B-12 PO) Take 1 tablet by mouth daily.   irbesartan-hydrochlorothiazide (AVALIDE) 300-12.5 MG tablet Take  1 tablet by mouth once daily   isosorbide mononitrate (IMDUR) 30 MG 24 hr tablet Take 0.5 tablets (15 mg total) by mouth daily.   ketoconazole (NIZORAL) 2 % shampoo Apply 1 application topically daily as needed (scalp irritation.).   metoprolol tartrate (LOPRESSOR) 25 MG tablet Take 1 tablet by mouth once daily   nitroGLYCERIN (NITROSTAT) 0.4 MG SL tablet Place  1 tablet (0.4 mg total) under the tongue every 5 (five) minutes as needed for chest pain.   nystatin cream (MYCOSTATIN) Apply 1 application topically daily as needed (skin irritation.).   Omega-3 Fatty Acids (FISH OIL) 1000 MG CAPS Take 1 capsule by mouth every other day. In the evening, alternating with fenofibrate acid   rosuvastatin (CRESTOR) 40 MG tablet Take 1 tablet (40 mg total) by mouth daily.   spironolactone (ALDACTONE) 25 MG tablet Take 1 tablet by mouth once daily   triamcinolone cream (KENALOG) 0.1 % Apply 1 application topically daily as needed (skin irritation.).     Allergies  Allergen Reactions   Adhesive [Tape] Other (See Comments)    Mainly just redness.   Has been ruled out for latex allegery   Hydrochlorothiazide Other (See Comments)    H/o Pancreatitis, takes in Avalide at home   Latex Other (See Comments)    unknown   Vantin [Cefpodoxime] Rash    Social History   Socioeconomic History   Marital status: Married    Spouse name: Not on file   Number of children: Not on file   Years of education: Not on file   Highest education level: Not on file  Occupational History   Not on file  Tobacco Use   Smoking status: Never   Smokeless tobacco: Never  Substance and Sexual Activity   Alcohol use: No   Drug use: No   Sexual activity: Not on file  Other Topics Concern   Not on file  Social History Narrative   Not on file   Social Determinants of Health   Financial Resource Strain: Not on file  Food Insecurity: Not on file  Transportation Needs: Not on file  Physical Activity: Not on file  Stress: Not on file  Social Connections: Not on file  Intimate Partner Violence: Not on file     Review of Systems: General: negative for chills, fever, night sweats or weight changes.  Cardiovascular: negative for chest pain, dyspnea on exertion, edema, orthopnea, palpitations, paroxysmal nocturnal dyspnea or shortness of breath Dermatological: negative for  rash Respiratory: negative for cough or wheezing Urologic: negative for hematuria Abdominal: negative for nausea, vomiting, diarrhea, bright red blood per rectum, melena, or hematemesis Neurologic: negative for visual changes, syncope, or dizziness All other systems reviewed and are otherwise negative except as noted above.    Blood pressure 130/68, height 5' 11.5" (1.816 m), weight 203 lb (92.1 kg).  General appearance: alert and no distress Neck: no adenopathy, no carotid bruit, no JVD, supple, symmetrical, trachea midline, and thyroid not enlarged, symmetric, no tenderness/mass/nodules Lungs: clear to auscultation bilaterally Heart: regular rate and rhythm, S1, S2 normal, no murmur, click, rub or gallop Extremities: extremities normal, atraumatic, no cyanosis or edema Pulses: 2+ and symmetric Skin: Skin color, texture, turgor normal. No rashes or lesions Neurologic: Grossly normal  EKG sinus rhythm at 64 without ST or T wave changes.  Personally reviewed this EKG.  ASSESSMENT AND PLAN:   Status post coronary artery stent placement Mr. Butrick returns for follow-up.  I saw him 3 weeks ago.  He  has CAD history dating back to April 16, 1996 when I placed a stent in his LAD.  His last cath performed 05/02/2021 was remarkable for patent stents in the proximal and mid LAD with a 70% stenosis just proximal to the mid LAD stent which was significant by DFR.  I placed a 2.5 x 12 mm long Medtronic frontier drug-eluting stent postdilated to 2.8 mm.  He also had a 60% tubular proximal PDA stenosis off of his RCA which was negative by DFR (0.94).  He has noticed increasing effort angina relieved with sublingual nitroglycerin.  He said falsely negative Myoview stress test in the past.  We will proceed with outpatient diagnostic coronary angiography.     Runell Gess MD FACP,FACC,FAHA, [facility] 09/16/2021 10:10 AM

## 2021-09-16 NOTE — Progress Notes (Signed)
09/16/2021 Jeffrey Acevedo   10-04-1940  [id number]  Primary Physician [name], MD Primary Cardiologist: Runell Gess MD FACP, [place], [place], [place]  HPI:  Jeffrey Acevedo is a 81 y.o.   overweight married Caucasian male, father of 2 and grandfather of 4 grandchildren, whom I last saw in the office 08/23/2021.[name]  He is accompanied by his daughter Selena Batten and his wife [name] today.  He has a history of CAD, status post LAD stenting by me using bare-metal stent April 16, 1996. He had normal LV function at that time with normal renal arteries as well. His other problems include essential hypertension, hyperlipidemia. He had a normal Myoview February 2011 and was scheduled to have elective robotic prostatectomy for prostate cancer by Dr. Crecencio Mc. He developed exertional chest pain similar to his pre-stent symptoms, which were fairly reproducible and because of this, and despite a negative Myoview, I elected to recatheterize him June 17, 2009, revealing a patent proximal LAD stent with 90% stenosis just beyond this, which I then stented with a Multi-Link Vision bare-metal stent with an excellent result. Since I saw him a year ago he's been completely asymptomatic. He did have a Myoview stress test performed 03/31/15 which was entirely normal.. This was performed for preoperative clearance before a right total hip replacement on 04/19/15 which was uneventful.   Because of symptoms of dyspnea and palpitations, I did get a 2D echo which was unremarkable, Myoview stress test which was nonischemic and a event monitor that showed frequent PVCs and short runs of nonsustained ventricular tachycardia and SVT.  He clearly is symptomatic.  Because his last stress test in 2011 was falsely negative and cath subsequently revealed a 90% LAD lesion we decided jointly to proceed with outpatient diagnostic coronary angiography  I performed outpatient diagnostic coronary angiography via the right radial approach on  05/02/2021 revealing patent proximal and mid LAD stents with a 70% proximal to mid LAD stenosis that was physiologically significant by FFR.  I placed a 2.5 mm x 12 mm long Medtronic frontier drug-eluting stent postdilated with a 2.75 mm balloon (2.8 mm).  He also had a 60% tubular proximal PDA stenosis off of the RCA which was negative by DFR (0.94).  He was discharged home the following day.  He did develop COVID that day which has taken 2 weeks to resolve his symptoms of shortness of breath.   Since I saw him 3 weeks ago he did have a 2D echo performed 09/05/2021 which was normal.  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  He also complains of tachypalpitations.  After consideration of all options it was jointly decided to proceed with outpatient diagnostic coronary angiography.  Current Meds  Medication Sig   amLODipine (NORVASC) 10 MG tablet Take 1 tablet by mouth once daily   aspirin EC 81 MG EC tablet Take 1 tablet (81 mg total) by mouth daily. Swallow whole.   Choline Fenofibrate (FENOFIBRIC ACID) 45 MG CPDR Take 1 capsule by mouth once daily   clobetasol ointment (TEMOVATE) 0.05 % 1 application 2 (two) times daily as needed (hives).   clopidogrel (PLAVIX) 75 MG tablet TAKE ONE TABLET BY MOUTH ONCE DAILY (Patient taking differently: Take 75 mg by mouth daily.)   clotrimazole-betamethasone (LOTRISONE) cream Apply 1 application topically 2 (two) times daily as needed (skin irritation.).   Cyanocobalamin (VITAMIN B-12 PO) Take 1 tablet by mouth daily.   irbesartan-hydrochlorothiazide (AVALIDE) 300-12.5 MG tablet Take  1 tablet by mouth once daily   isosorbide mononitrate (IMDUR) 30 MG 24 hr tablet Take 0.5 tablets (15 mg total) by mouth daily.   ketoconazole (NIZORAL) 2 % shampoo Apply 1 application topically daily as needed (scalp irritation.).   metoprolol tartrate (LOPRESSOR) 25 MG tablet Take 1 tablet by mouth once daily   nitroGLYCERIN (NITROSTAT) 0.4 MG SL tablet Place  1 tablet (0.4 mg total) under the tongue every 5 (five) minutes as needed for chest pain.   nystatin cream (MYCOSTATIN) Apply 1 application topically daily as needed (skin irritation.).   Omega-3 Fatty Acids (FISH OIL) 1000 MG CAPS Take 1 capsule by mouth every other day. In the evening, alternating with fenofibrate acid   rosuvastatin (CRESTOR) 40 MG tablet Take 1 tablet (40 mg total) by mouth daily.   spironolactone (ALDACTONE) 25 MG tablet Take 1 tablet by mouth once daily   triamcinolone cream (KENALOG) 0.1 % Apply 1 application topically daily as needed (skin irritation.).     Allergies  Allergen Reactions   Adhesive [Tape] Other (See Comments)    Mainly just redness.   Has been ruled out for latex allegery   Hydrochlorothiazide Other (See Comments)    H/o Pancreatitis, takes in Avalide at home   Latex Other (See Comments)    unknown   Vantin [Cefpodoxime] Rash    Social History   Socioeconomic History   Marital status: Married    Spouse name: Not on file   Number of children: Not on file   Years of education: Not on file   Highest education level: Not on file  Occupational History   Not on file  Tobacco Use   Smoking status: Never   Smokeless tobacco: Never  Substance and Sexual Activity   Alcohol use: No   Drug use: No   Sexual activity: Not on file  Other Topics Concern   Not on file  Social History Narrative   Not on file   Social Determinants of Health   Financial Resource Strain: Not on file  Food Insecurity: Not on file  Transportation Needs: Not on file  Physical Activity: Not on file  Stress: Not on file  Social Connections: Not on file  Intimate Partner Violence: Not on file     Review of Systems: General: negative for chills, fever, night sweats or weight changes.  Cardiovascular: negative for chest pain, dyspnea on exertion, edema, orthopnea, palpitations, paroxysmal nocturnal dyspnea or shortness of breath Dermatological: negative for  rash Respiratory: negative for cough or wheezing Urologic: negative for hematuria Abdominal: negative for nausea, vomiting, diarrhea, bright red blood per rectum, melena, or hematemesis Neurologic: negative for visual changes, syncope, or dizziness All other systems reviewed and are otherwise negative except as noted above.    Blood pressure 130/68, height 5' 11.5" (1.816 m), weight 203 lb (92.1 kg).  General appearance: alert and no distress Neck: no adenopathy, no carotid bruit, no JVD, supple, symmetrical, trachea midline, and thyroid not enlarged, symmetric, no tenderness/mass/nodules Lungs: clear to auscultation bilaterally Heart: regular rate and rhythm, S1, S2 normal, no murmur, click, rub or gallop Extremities: extremities normal, atraumatic, no cyanosis or edema Pulses: 2+ and symmetric Skin: Skin color, texture, turgor normal. No rashes or lesions Neurologic: Grossly normal  EKG sinus rhythm at 64 without ST or T wave changes.  Personally reviewed this EKG.  ASSESSMENT AND PLAN:   Status post coronary artery stent placement Mr. Butrick returns for follow-up.  I saw him 3 weeks ago.  He  has CAD history dating back to April 16, 1996 when I placed a stent in his LAD.  His last cath performed 05/02/2021 was remarkable for patent stents in the proximal and mid LAD with a 70% stenosis just proximal to the mid LAD stent which was significant by DFR.  I placed a 2.5 x 12 mm long Medtronic frontier drug-eluting stent postdilated to 2.8 mm.  He also had a 60% tubular proximal PDA stenosis off of his RCA which was negative by DFR (0.94).  He has noticed increasing effort angina relieved with sublingual nitroglycerin.  He said falsely negative Myoview stress test in the past.  We will proceed with outpatient diagnostic coronary angiography.     Runell Gess MD FACP,FACC,FAHA, [facility] 09/16/2021 10:10 AM

## 2021-09-16 NOTE — Assessment & Plan Note (Signed)
Jeffrey Acevedo returns for follow-up.  I saw him 3 weeks ago.  He has CAD history dating back to April 16, 1996 when I placed a stent in his LAD.  His last cath performed 05/02/2021 was remarkable for patent stents in the proximal and mid LAD with a 70% stenosis just proximal to the mid LAD stent which was significant by DFR.  I placed a 2.5 x 12 mm long Medtronic frontier drug-eluting stent postdilated to 2.8 mm.  He also had a 60% tubular proximal PDA stenosis off of his RCA which was negative by DFR (0.94).  He has noticed increasing effort angina relieved with sublingual nitroglycerin.  He said falsely negative Myoview stress test in the past.  We will proceed with outpatient diagnostic coronary angiography.

## 2021-09-16 NOTE — Patient Instructions (Addendum)
Medication Instructions:  Your physician recommends that you continue on your current medications as directed. Please refer to the Current Medication list given to you today.  *If you need a refill on your cardiac medications before your next appointment, please call your pharmacy*   Lab Work: Your physician recommends that you have labs drawn today: BMET & CBC   If you have labs (blood work) drawn today and your tests are completely normal, you will receive your results only by: [place] (if you have MyChart) OR A paper copy in the mail If you have any lab test that is abnormal or we need to change your treatment, we will call you to review the results.   Follow-Up: At [facility], you and your health needs are our priority.  As part of our continuing mission to provide you with exceptional heart care, we have created designated Provider Care Teams.  These Care Teams include your primary Cardiologist (physician) and Advanced Practice Providers (APPs -  Physician Assistants and Nurse Practitioners) who all work together to provide you with the care you need, when you need it.  We recommend signing up for the patient portal called "MyChart".  Sign up information is provided on this After Visit Summary.  MyChart is used to connect with patients for Virtual Visits (Telemedicine).  Patients are able to view lab/test results, encounter notes, upcoming appointments, etc.  Non-urgent messages can be sent to your provider as well.   To learn more about what you can do with MyChart, go to [email or web address].    Your next appointment:   2-3 week(s) after your prod  The format for your next appointment:   In Person  Provider:   Quay Burow, MD   Other Instructions  [place] [address] Dept: [phone] Loc: [place]  09/16/2021  You  are scheduled for a Cardiac Catheterization on Monday, July 3 with Dr. Quay Burow.  1. Please arrive at the Main Entrance A at [facility]: [place], [place] [address] at 8:30 AM (This time is two hours before your procedure to ensure your preparation). Free valet parking service is available.   Special note: Every effort is made to have your procedure done on time. Please understand that emergencies sometimes delay scheduled procedures.  2. Diet: Do not eat solid foods after midnight.  You may have clear liquids until 5 AM upon the day of the procedure.  3. Labs: You will need to have blood drawn today.  4. Medication instructions in preparation for your procedure:    Stop taking, Spironolactone  Monday, July 3,   On the morning of your procedure, take Aspirin and Plavix/Clopidogrel and any morning medicines NOT listed above.  You may use sips of water.  5. Plan to go home the same day, you will only stay overnight if medically necessary. 6. You MUST have a responsible adult to drive you home. 7. An adult MUST be with you the first 24 hours after you arrive home. 8. Bring a current list of your medications, and the last time and date medication taken. 9. Bring ID and current insurance cards. 10.Please wear clothes that are easy to get on and off and wear slip-on shoes.  Thank you for allowing [place] to care for you!   -- [facility]

## 2021-09-17 LAB — BASIC METABOLIC PANEL
BUN/Creatinine Ratio: 24 (ref 10–24)
BUN: 26 mg/dL (ref 8–27)
CO2: 25 mmol/L (ref 20–29)
Calcium: 10.1 mg/dL (ref 8.6–10.2)
Chloride: 101 mmol/L (ref 96–106)
Creatinine, Ser: 1.1 mg/dL (ref 0.76–1.27)
Glucose: 103 mg/dL — ABNORMAL HIGH (ref 70–99)
Potassium: 4.9 mmol/L (ref 3.5–5.2)
Sodium: 139 mmol/L (ref 134–144)
eGFR: 68 mL/min/{1.73_m2} (ref >59)

## 2021-09-17 LAB — CBC
Hematocrit: 45.8 % (ref 37.5–51.0)
Hemoglobin: 15.1 g/dL (ref 13.0–17.7)
MCH: 27.9 pg (ref 26.6–33.0)
MCHC: 33 g/dL (ref 31.5–35.7)
MCV: 85 fL (ref 79–97)
Platelets: 216 10*3/uL (ref 150–450)
RBC: 5.42 x10E6/uL (ref 4.14–5.80)
RDW: 14.5 % (ref 11.6–15.4)
WBC: 12.4 10*3/uL — ABNORMAL HIGH (ref 3.4–10.8)

## 2021-09-19 ENCOUNTER — Ambulatory Visit (HOSPITAL_COMMUNITY)
Admission: RE | Admit: 2021-09-19 | Discharge: 2021-09-20 | Disposition: A | Discharge status: Home / Self Care | Payer: Medicare PPO | Attending: Cardiovascular Disease | Admitting: Cardiovascular Disease

## 2021-09-19 ENCOUNTER — Ambulatory Visit (HOSPITAL_COMMUNITY)
Admission: RE | Disposition: A | Discharge status: Home / Self Care | Payer: Self-pay | Source: Home / Self Care | Attending: Cardiovascular Disease

## 2021-09-19 DIAGNOSIS — I10 Essential (primary) hypertension: Secondary | ICD-10-CM | POA: Diagnosis present

## 2021-09-19 DIAGNOSIS — I2510 Atherosclerotic heart disease of native coronary artery without angina pectoris: Secondary | ICD-10-CM | POA: Diagnosis present

## 2021-09-19 DIAGNOSIS — I25110 Atherosclerotic heart disease of native coronary artery with unstable angina pectoris: Secondary | ICD-10-CM | POA: Diagnosis not present

## 2021-09-19 DIAGNOSIS — Z955 Presence of coronary angioplasty implant and graft: Secondary | ICD-10-CM

## 2021-09-19 DIAGNOSIS — E785 Hyperlipidemia, unspecified: Secondary | ICD-10-CM | POA: Diagnosis not present

## 2021-09-19 HISTORY — PX: LEFT HEART CATH AND CORONARY ANGIOGRAPHY: CATH118249

## 2021-09-19 HISTORY — PX: INTRAVASCULAR PRESSURE WIRE/FFR STUDY: CATH118243

## 2021-09-19 HISTORY — PX: CORONARY STENT INTERVENTION: CATH118234

## 2021-09-19 LAB — POCT ACTIVATED CLOTTING TIME
Activated Clotting Time: 269 seconds
Activated Clotting Time: 287 seconds

## 2021-09-19 SURGERY — LEFT HEART CATH AND CORONARY ANGIOGRAPHY
Anesthesia: LOCAL

## 2021-09-19 MED ORDER — VERAPAMIL HCL 2.5 MG/ML IV SOLN
INTRA_ARTERIAL | Status: DC | PRN
  Administered 2021-09-19: 10 mL via INTRA_ARTERIAL
  Administered 2021-09-19: 5 mL via INTRA_ARTERIAL

## 2021-09-19 MED ORDER — HYDROCHLOROTHIAZIDE 12.5 MG PO TABS
12.5000 mg | ORAL_TABLET | Freq: Every day | ORAL | Status: DC
  Administered 2021-09-20: 12.5 mg via ORAL
  Filled 2021-09-19: qty 1

## 2021-09-19 MED ORDER — CLOPIDOGREL BISULFATE 300 MG PO TABS
ORAL_TABLET | ORAL | Status: AC
  Filled 2021-09-19: qty 1

## 2021-09-19 MED ORDER — CLOPIDOGREL BISULFATE 75 MG PO TABS
75.0000 mg | ORAL_TABLET | Freq: Every day | ORAL | Status: DC
  Administered 2021-09-20: 75 mg via ORAL
  Filled 2021-09-19: qty 1

## 2021-09-19 MED ORDER — IRBESARTAN-HYDROCHLOROTHIAZIDE 300-12.5 MG PO TABS: 1.0000 | ORAL_TABLET | Freq: Every day | ORAL | Status: DC

## 2021-09-19 MED ORDER — SODIUM CHLORIDE 0.9% FLUSH: 3.0000 mL | INTRAVENOUS | Status: DC | PRN

## 2021-09-19 MED ORDER — LIDOCAINE HCL (PF) 1 % IJ SOLN
INTRAMUSCULAR | Status: AC
  Filled 2021-09-19: qty 30

## 2021-09-19 MED ORDER — HYDRALAZINE HCL 20 MG/ML IJ SOLN: 10.0000 mg | INTRAMUSCULAR | Status: AC | PRN

## 2021-09-19 MED ORDER — SODIUM CHLORIDE 0.9 % IV SOLN: INTRAVENOUS | Status: AC

## 2021-09-19 MED ORDER — ASPIRIN 81 MG PO CHEW: 81.0000 mg | CHEWABLE_TABLET | ORAL | Status: DC

## 2021-09-19 MED ORDER — LABETALOL HCL 5 MG/ML IV SOLN
10.0000 mg | INTRAVENOUS | Status: AC | PRN
Start: 2021-09-19 — End: 2021-09-19

## 2021-09-19 MED ORDER — CLOPIDOGREL BISULFATE 300 MG PO TABS
ORAL_TABLET | ORAL | Status: DC | PRN
  Administered 2021-09-19: 300 mg via ORAL

## 2021-09-19 MED ORDER — SODIUM CHLORIDE 0.9 % IV SOLN: 250.0000 mL | INTRAVENOUS | Status: DC | PRN

## 2021-09-19 MED ORDER — ACETAMINOPHEN 325 MG PO TABS: 650.0000 mg | ORAL_TABLET | ORAL | Status: DC | PRN

## 2021-09-19 MED ORDER — LIDOCAINE HCL (PF) 1 % IJ SOLN
INTRAMUSCULAR | Status: DC | PRN
  Administered 2021-09-19: 3 mL

## 2021-09-19 MED ORDER — IOHEXOL 350 MG/ML SOLN
INTRAVENOUS | Status: DC | PRN
  Administered 2021-09-19: 145 mL

## 2021-09-19 MED ORDER — HEPARIN (PORCINE) IN NACL 1000-0.9 UT/500ML-% IV SOLN
INTRAVENOUS | Status: DC | PRN
  Administered 2021-09-19 (×2): 500 mL

## 2021-09-19 MED ORDER — FENTANYL CITRATE (PF) 100 MCG/2ML IJ SOLN
INTRAMUSCULAR | Status: DC | PRN
  Administered 2021-09-19: 25 ug via INTRAVENOUS

## 2021-09-19 MED ORDER — AMLODIPINE BESYLATE 5 MG PO TABS
10.0000 mg | ORAL_TABLET | Freq: Every day | ORAL | Status: DC
  Administered 2021-09-19 – 2021-09-20 (×2): 10 mg via ORAL
  Filled 2021-09-19 (×2): qty 2

## 2021-09-19 MED ORDER — SODIUM CHLORIDE 0.9% FLUSH
3.0000 mL | Freq: Two times a day (BID) | INTRAVENOUS | Status: DC
  Administered 2021-09-19: 3 mL via INTRAVENOUS

## 2021-09-19 MED ORDER — NITROGLYCERIN 1 MG/10 ML FOR IR/CATH LAB
INTRA_ARTERIAL | Status: AC
  Filled 2021-09-19: qty 10

## 2021-09-19 MED ORDER — IRBESARTAN 150 MG PO TABS
300.0000 mg | ORAL_TABLET | Freq: Every day | ORAL | Status: DC
  Administered 2021-09-20: 300 mg via ORAL
  Filled 2021-09-19: qty 2

## 2021-09-19 MED ORDER — MIDAZOLAM HCL 2 MG/2ML IJ SOLN
INTRAMUSCULAR | Status: AC
  Filled 2021-09-19: qty 2

## 2021-09-19 MED ORDER — MIDAZOLAM HCL 2 MG/2ML IJ SOLN
INTRAMUSCULAR | Status: DC | PRN
  Administered 2021-09-19: 1 mg via INTRAVENOUS

## 2021-09-19 MED ORDER — ISOSORBIDE MONONITRATE ER 30 MG PO TB24
15.0000 mg | ORAL_TABLET | Freq: Every day | ORAL | Status: DC
  Filled 2021-09-19: qty 1

## 2021-09-19 MED ORDER — HEPARIN SODIUM (PORCINE) 1000 UNIT/ML IJ SOLN
INTRAMUSCULAR | Status: DC | PRN
  Administered 2021-09-19: 2500 [IU] via INTRAVENOUS
  Administered 2021-09-19: 4500 [IU] via INTRAVENOUS
  Administered 2021-09-19: 5000 [IU] via INTRAVENOUS

## 2021-09-19 MED ORDER — ASPIRIN 81 MG PO TBEC
81.0000 mg | DELAYED_RELEASE_TABLET | Freq: Every day | ORAL | Status: DC
  Administered 2021-09-20: 81 mg via ORAL
  Filled 2021-09-19: qty 1

## 2021-09-19 MED ORDER — ONDANSETRON HCL 4 MG/2ML IJ SOLN: 4.0000 mg | Freq: Four times a day (QID) | INTRAMUSCULAR | Status: DC | PRN

## 2021-09-19 MED ORDER — METOPROLOL TARTRATE 25 MG PO TABS
25.0000 mg | ORAL_TABLET | Freq: Every day | ORAL | Status: DC
  Administered 2021-09-20: 25 mg via ORAL
  Filled 2021-09-19: qty 1

## 2021-09-19 MED ORDER — SODIUM CHLORIDE 0.9 % WEIGHT BASED INFUSION: 1.0000 mL/kg/h | INTRAVENOUS | Status: DC

## 2021-09-19 MED ORDER — ROSUVASTATIN CALCIUM 20 MG PO TABS
40.0000 mg | ORAL_TABLET | Freq: Every day | ORAL | Status: DC
  Administered 2021-09-19 – 2021-09-20 (×2): 40 mg via ORAL
  Filled 2021-09-19 (×2): qty 2

## 2021-09-19 MED ORDER — SPIRONOLACTONE 25 MG PO TABS
25.0000 mg | ORAL_TABLET | Freq: Every day | ORAL | Status: DC
  Administered 2021-09-19 – 2021-09-20 (×2): 25 mg via ORAL
  Filled 2021-09-19 (×2): qty 1

## 2021-09-19 MED ORDER — HEPARIN (PORCINE) IN NACL 1000-0.9 UT/500ML-% IV SOLN
INTRAVENOUS | Status: AC
  Filled 2021-09-19: qty 500

## 2021-09-19 MED ORDER — VERAPAMIL HCL 2.5 MG/ML IV SOLN
INTRAVENOUS | Status: AC
  Filled 2021-09-19: qty 2

## 2021-09-19 MED ORDER — CLOPIDOGREL BISULFATE 75 MG PO TABS: 75.0000 mg | ORAL_TABLET | Freq: Every day | ORAL | Status: DC

## 2021-09-19 MED ORDER — MORPHINE SULFATE (PF) 2 MG/ML IV SOLN: 2.0000 mg | INTRAVENOUS | Status: DC | PRN

## 2021-09-19 MED ORDER — FENTANYL CITRATE (PF) 100 MCG/2ML IJ SOLN
INTRAMUSCULAR | Status: AC
  Filled 2021-09-19: qty 2

## 2021-09-19 MED ORDER — HEPARIN SODIUM (PORCINE) 1000 UNIT/ML IJ SOLN
INTRAMUSCULAR | Status: AC
  Filled 2021-09-19: qty 10

## 2021-09-19 MED ORDER — SODIUM CHLORIDE 0.9 % WEIGHT BASED INFUSION
3.0000 mL/kg/h | INTRAVENOUS | Status: DC
  Administered 2021-09-19: 3 mL/kg/h via INTRAVENOUS

## 2021-09-19 MED ORDER — ASPIRIN 81 MG PO CHEW: 81.0000 mg | CHEWABLE_TABLET | Freq: Every day | ORAL | Status: DC

## 2021-09-19 SURGICAL SUPPLY — 23 items
BALLN EUPHORA RX 2.0X12 (BALLOONS) ×2
BALLOON EUPHORA RX 2.0X12 (BALLOONS) IMPLANT
BAND CMPR LRG ZPHR (HEMOSTASIS) ×1
BAND ZEPHYR COMPRESS 30 LONG (HEMOSTASIS) ×1 IMPLANT
CATH GUIDEZILLA II 6F (CATHETERS) IMPLANT
CATH INFINITI JR4 5F (CATHETERS) ×1 IMPLANT
CATH LAUNCHER 6FR JR4 (CATHETERS) ×1 IMPLANT
CATH OPTITORQUE TIG 4.0 5F (CATHETERS) ×1 IMPLANT
CATH VISTA GUIDE 6FR XB3 (CATHETERS) ×1 IMPLANT
CATHETER GUIDEZILLA II 6F (CATHETERS) ×2
ELECT DEFIB PAD ADLT CADENCE (PAD) ×1 IMPLANT
GLIDESHEATH SLEND A-KIT 6F 22G (SHEATH) ×1 IMPLANT
GUIDEWIRE INQWIRE 1.5J.035X260 (WIRE) IMPLANT
GUIDEWIRE PRESSURE X 175 (WIRE) ×1 IMPLANT
INQWIRE 1.5J .035X260CM (WIRE) ×2
KIT ENCORE 26 ADVANTAGE (KITS) ×1 IMPLANT
KIT HEART LEFT (KITS) ×2 IMPLANT
PACK CARDIAC CATHETERIZATION (CUSTOM PROCEDURE TRAY) ×2 IMPLANT
STENT ONYX FRONTIER 2.5X12 (Permanent Stent) ×1 IMPLANT
TRANSDUCER W/STOPCOCK (MISCELLANEOUS) ×2 IMPLANT
TUBING CIL FLEX 10 FLL-RA (TUBING) ×2 IMPLANT
WIRE ASAHI PROWATER 180CM (WIRE) ×1 IMPLANT
WIRE HI TORQ VERSACORE-J 145CM (WIRE) ×1 IMPLANT

## 2021-09-19 NOTE — Progress Notes (Signed)
Discussed/reviewed with pt and daughters stent, restrictions, Plavix, exercise, NTG, and CRPII. Pt receptive and would like to do CRPII again since he did not finish due to angina. Will refer to [place].  [place], ACSM 09/19/2021 3:08 PM

## 2021-09-19 NOTE — Interval H&P Note (Signed)
Cath Lab Visit (complete for each Cath Lab visit)  Clinical Evaluation Leading to the Procedure:   ACS: No.  Non-ACS:    Anginal Classification: CCS II  Anti-ischemic medical therapy: Maximal Therapy (2 or more classes of medications)  Non-Invasive Test Results: No non-invasive testing performed  Prior CABG: No previous CABG      History and Physical Interval Note:  09/19/2021 10:25 AM  Jeffrey Acevedo  has presented today for surgery, with the diagnosis of unstable angina.  The various methods of treatment have been discussed with the patient and family. After consideration of risks, benefits and other options for treatment, the patient has consented to  Procedure(s): LEFT HEART CATH AND CORONARY ANGIOGRAPHY (N/A) as a surgical intervention.  The patient's history has been reviewed, patient examined, no change in status, stable for surgery.  I have reviewed the patient's chart and labs.  Questions were answered to the patient's satisfaction.     Quay Burow

## 2021-09-20 DIAGNOSIS — Z955 Presence of coronary angioplasty implant and graft: Secondary | ICD-10-CM | POA: Diagnosis not present

## 2021-09-20 DIAGNOSIS — I25110 Atherosclerotic heart disease of native coronary artery with unstable angina pectoris: Secondary | ICD-10-CM | POA: Diagnosis not present

## 2021-09-20 DIAGNOSIS — E785 Hyperlipidemia, unspecified: Secondary | ICD-10-CM | POA: Diagnosis not present

## 2021-09-20 DIAGNOSIS — I10 Essential (primary) hypertension: Secondary | ICD-10-CM | POA: Diagnosis not present

## 2021-09-20 LAB — CBC
HCT: 44.2 % (ref 39.0–52.0)
Hemoglobin: 14.7 g/dL (ref 13.0–17.0)
MCH: 28.5 pg (ref 26.0–34.0)
MCHC: 33.3 g/dL (ref 30.0–36.0)
MCV: 85.8 fL (ref 80.0–100.0)
Platelets: 193 10*3/uL (ref 150–400)
RBC: 5.15 MIL/uL (ref 4.22–5.81)
RDW: 15.2 % (ref 11.5–15.5)
WBC: 9.7 10*3/uL (ref 4.0–10.5)
nRBC: 0 % (ref 0.0–0.2)

## 2021-09-20 LAB — BASIC METABOLIC PANEL
Anion gap: 8 (ref 5–15)
BUN: 23 mg/dL (ref 8–23)
CO2: 22 mmol/L (ref 22–32)
Calcium: 9.3 mg/dL (ref 8.9–10.3)
Chloride: 106 mmol/L (ref 98–111)
Creatinine, Ser: 1.1 mg/dL (ref 0.61–1.24)
GFR, Estimated: >60 mL/min (ref >60)
Glucose, Bld: 99 mg/dL (ref 70–99)
Potassium: 4.7 mmol/L (ref 3.5–5.1)
Sodium: 136 mmol/L (ref 135–145)

## 2021-09-20 MED ORDER — NITROGLYCERIN 0.4 MG SL SUBL: 0.4000 mg | SUBLINGUAL_TABLET | SUBLINGUAL | 2 refills | Status: DC | PRN

## 2021-09-20 NOTE — Plan of Care (Signed)
°  Problem: Education: °Goal: Understanding of CV disease, CV risk reduction, and recovery process will improve °Outcome: Adequate for Discharge °Goal: Individualized Educational Video(s) °Outcome: Adequate for Discharge °  °

## 2021-09-20 NOTE — Progress Notes (Signed)
Progress Note  Patient Name: Jeffrey Acevedo Date of Encounter: 09/20/2021  [facility] Cardiologist: [name], MD   Subjective   Postop day 1 PDA PCI drug-eluting stenting for progressive angina.  Patient has been asymptomatic since yesterday.  Inpatient Medications    Scheduled Meds:  amLODipine  10 mg Oral Daily   aspirin EC  81 mg Oral Daily   clopidogrel  75 mg Oral Q breakfast   irbesartan  300 mg Oral Daily   And   hydrochlorothiazide  12.5 mg Oral Daily   isosorbide mononitrate  15 mg Oral Daily   metoprolol tartrate  25 mg Oral Daily   rosuvastatin  40 mg Oral Daily   sodium chloride flush  3 mL Intravenous Q12H   spironolactone  25 mg Oral Daily   Continuous Infusions:  sodium chloride     PRN Meds: sodium chloride, acetaminophen, morphine injection, ondansetron (ZOFRAN) IV, sodium chloride flush   Vital Signs    Vitals:   09/19/21 1627 09/19/21 1959 09/19/21 2325 09/20/21 0448  BP: 122/70 117/70 113/66 139/78  Pulse: (!) 54 62 (!) 56 (!) 56  Resp: 18 20 20 20   Temp: 97.9 F (36.6 C) 97.7 F (36.5 C) 97.9 F (36.6 C) 97.8 F (36.6 C)  TempSrc: Oral Oral Oral Oral  SpO2: 96% 96% 99% 96%  Weight:      Height:        Intake/Output Summary (Last 24 hours) at 09/20/2021 0736 Last data filed at 09/20/2021 0450 Gross per 24 hour  Intake 1312.78 ml  Output 1425 ml  Net -112.22 ml      09/19/2021    2:00 PM 09/19/2021    8:30 AM 09/16/2021    9:25 AM  Last 3 Weights  Weight (lbs) 199 lb 8.3 oz 203 lb 203 lb  Weight (kg) 90.5 kg 92.08 kg 92.08 kg      Telemetry    Normal sinus rhythm- Personally Reviewed  ECG    Sinus bradycardia 57 without ST or T wave changes- Personally Reviewed  Physical Exam   GEN: No acute distress.   Neck: No JVD Cardiac: RRR, no murmurs, rubs, or gallops.  Respiratory: Clear to auscultation bilaterally. GI: Soft, nontender, non-distended  MS: No edema; No deformity. Neuro:  Nonfocal  Psych: Normal affect    Labs    High Sensitivity Troponin:  No results for input(s): "TROPONINIHS" in the last 720 hours.   Chemistry Recent Labs  Lab 09/16/21 1038 09/20/21 0115  NA 139 136  K 4.9 4.7  CL 101 106  CO2 25 22  GLUCOSE 103* 99  BUN 26 23  CREATININE 1.10 1.10  CALCIUM 10.1 9.3  GFRNONAA  --  >60  ANIONGAP  --  8    Lipids No results for input(s): "CHOL", "TRIG", "HDL", "LABVLDL", "LDLCALC", "CHOLHDL" in the last 168 hours.  Hematology Recent Labs  Lab 09/16/21 1038 09/20/21 0115  WBC 12.4* 9.7  RBC 5.42 5.15  HGB 15.1 14.7  HCT 45.8 44.2  MCV 85 85.8  MCH 27.9 28.5  MCHC 33.0 33.3  RDW 14.5 15.2  PLT 216 193   Thyroid No results for input(s): "TSH", "FREET4" in the last 168 hours.  BNPNo results for input(s): "BNP", "PROBNP" in the last 168 hours.  DDimer No results for input(s): "DDIMER" in the last 168 hours.   Radiology    CARDIAC CATHETERIZATION  Result Date: 09/19/2021 Images from the original result were not included.   Prox Cx  to Mid Cx lesion is 40% stenosed.   Ost RCA to Prox RCA lesion is 30% stenosed.   Ramus lesion is 60% stenosed.   RPDA lesion is 90% stenosed.   Non-stenotic Prox LAD to Mid LAD lesion was previously treated.   Non-stenotic Mid LAD-2 lesion was previously treated.   Non-stenotic Mid LAD-1 lesion was previously treated.   A drug-eluting stent was successfully placed using a STENT ONYX FRONTIER 2.5X12.   Post intervention, there is a 0% residual stenosis. Jeffrey Acevedo is a 81 y.o. male  [id number] LOCATION:  FACILITY: [facility] PHYSICIAN: [name], M.D. 1940-09-26 DATE OF PROCEDURE:  09/19/2021 DATE OF DISCHARGE: CARDIAC CATHETERIZATION / PCI DES PDA History obtained from chart review.Jeffrey Acevedo is a 81 y.o.   overweight married Caucasian male, father of 2 and grandfather of 4 grandchildren, whom I last saw in the office 08/23/2021.[name]  He is accompanied by his daughter Selena Batten and his wife [name] today.  He has a history of CAD, status post LAD stenting  by me using bare-metal stent April 16, 1996. He had normal LV function at that time with normal renal arteries as well. His other problems include essential hypertension, hyperlipidemia. He had a normal Myoview February 2011 and was scheduled to have elective robotic prostatectomy for prostate cancer by Dr. Crecencio Mc. He developed exertional chest pain similar to his pre-stent symptoms, which were fairly reproducible and because of this, and despite a negative Myoview, I elected to recatheterize him June 17, 2009, revealing a patent proximal LAD stent with 90% stenosis just beyond this, which I then stented with a Multi-Link Vision bare-metal stent with an excellent result. Since I saw him a year ago he's been completely asymptomatic. He did have a Myoview stress test performed 03/31/15 which was entirely normal.. This was performed for preoperative clearance before a right total hip replacement on 04/19/15 which was uneventful.  Because of symptoms of dyspnea and palpitations, I did get a 2D echo which was unremarkable, Myoview stress test which was nonischemic and a event monitor that showed frequent PVCs and short runs of nonsustained ventricular tachycardia and SVT.  He clearly is symptomatic.  Because his last stress test in 2011 was falsely negative and cath subsequently revealed a 90% LAD lesion we decided jointly to proceed with outpatient diagnostic coronary angiography I performed outpatient diagnostic coronary angiography via the right radial approach on 05/02/2021 revealing patent proximal and mid LAD stents with a 70% proximal to mid LAD stenosis that was physiologically significant by FFR.  I placed a 2.5 mm x 12 mm long Medtronic frontier drug-eluting stent postdilated with a 2.75 mm balloon (2.8 mm).  He also had a 60% tubular proximal PDA stenosis off of the RCA which was negative by DFR (0.94).  He was discharged home the following day.  He did develop COVID that day which has taken 2 weeks to  resolve his symptoms of shortness of breath.  Since I saw him 3 weeks ago he did have a 2D echo performed 09/05/2021 which was normal.  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  He also complains of tachypalpitations.  After consideration of all options it was jointly decided to proceed with outpatient diagnostic coronary angiography. PROCEDURE DESCRIPTION: The patient was brought to the second floor [place] [facility]iac cath lab in the postabsorptive state. He was premedicated with IV Versed and fentanyl. His right wrist was prepped and shaved in usual sterile fashion. Xylocaine 1% was used  for local anesthesia. A 6 French sheath was inserted into the right radial artery using standard Seldinger technique. The patient received 5000 units  of heparin intravenously.  A 5 [place] TIG catheter and right Judkins catheters were used for selective coronary angiography and obtain left heart pressures.  Isovue dye is used for the entirety of the case (145 cc of contrast total to patient).  Retrograde aortic, ventricular and pullback pressures were recorded.  Radial cocktail was administered via the SideArm sheath. Total contrast administered to the patient was 12,000 with an ACT of 269.  Isovue dye was used for the entirety of the intervention.  The patient received an additional 300 mg of p.o. clopidogrel. I initially performed DFR of the ramus branch using a 6 French XB 3.0 cm guide catheter along with the Abbott FloWire.  The DFR was 0.95 suggesting that the proximal ramus branch stenosis was not physiologically significant.  The LAD stents were widely patent.  The culprit lesion was progression of disease in the PDA. Using a 6 [place] JR4 guide catheter along the 0.14 [place] guidewire I predilated with a 2 mm x 12 mm balloon the proximal PDA lesion.  I then attempted to get a 2.5 x 16 mm long Synergy drug-eluting stent across the proximal band unsuccessfully.  Even with a guide liner this same stent  would not track.  I then changed to a 2.5 x 12 mm long Medtronic Onyx frontier drug-eluting stent which navigated the RCA with some difficulty.  I carefully positioned this in the proximal portion of the PDA and deployed at 16 atm (2.7 mm) resulting in reduction of a 95% proximal PDA stenosis to 0% residual.  Patient tolerated the procedure well without hemodynamic or electrocardiographic sequelae.   Cardiac catheterization revealed widely patent LAD stents.  The proximal ramus branch stenosis was negative by DFR suggesting it was not physiologically significant.  The PDA lesion demonstrated on prior cath in March had progressed and was most likely the culprit lesion which was successfully stented with a 2.5 mm x 12 mm long Medtronic Onyx frontier drug-eluting stent deployed at 2.7 mm.  The patient tolerated procedure well.  The guidewire and catheter were removed.  The radial sheath was removed and a Zephyr wristband was placed on the patient's wrist to achieve patent hemostasis.  The patient left lab in stable condition..  Plans will be for hydration overnight and discharge home in the morning.  He left the lab in stable condition. [name]. MD, [facility] 09/19/2021 12:01 PM     Cardiac Studies   Cardiac catheterization/PCI drug-eluting stent (7/3/202 23)  Conclusion      Prox Cx to Mid Cx lesion is 40% stenosed.   Ost RCA to Prox RCA lesion is 30% stenosed.   Ramus lesion is 60% stenosed.   RPDA lesion is 90% stenosed.   Non-stenotic Prox LAD to Mid LAD lesion was previously treated.   Non-stenotic Mid LAD-2 lesion was previously treated.   Non-stenotic Mid LAD-1 lesion was previously treated.   A drug-eluting stent was successfully placed using a STENT ONYX FRONTIER 2.5X12.   Post intervention, there is a 0% residual stenosis.    Intervention    Patient Profile     81 y.o. male with long history of CAD dating back to 36 with multiple procedures since that time who had LAD stenting by  myself 3 months ago and now with progressive angina admitted for diagnostic coronary angiography.  Assessment & Plan  1: Coronary artery disease-history of CAD status post LAD stenting by myself 04/16/1996.  He had LAD stenting by myself 06/17/2009.  Most recently I catheterized him 05/02/2021 and placed a stent in his proximal LAD between his 2 prior stents.  At that time he had a 60% proximal high obtuse marginal branch/ramus branch stenosis and a 60% PDA lesion which was negative by DFR.  Over the last 3 months has had progressive angina which is nitrate responsive and somewhat debilitating.  I elected to cath him yesterday revealing patent LAD stents.  I performed DFR of the proximal ramus branch which was 0.95 suggesting this was not physiologically significant.  His PDA lesion however had progressed from 60% to 95% which I stented with some technical difficulty but current excellent angiographic result.  He has had no chest pain since.  His right wrist is stable.  He can be discharged home this morning on DAPT uninterrupted indefinitely.  I will see him back in the office in 3 to 4 weeks.  For questions or updates, please contact [facility] Please consult [email or web address] for contact info under        Signed, [name], MD  09/20/2021, 7:36 AM

## 2021-09-20 NOTE — Discharge Summary (Addendum)
of the case (145 cc of contrast total to patient).  Retrograde aortic, ventricular and pullback pressures were recorded.  Radial cocktail was administered via the SideArm sheath. Total contrast administered to the patient was 12,000 with an ACT of 269.  Isovue dye was used for the entirety of the intervention.  The patient received an additional 300 mg of p.o. clopidogrel. I initially performed DFR of the ramus branch using a 6 French XB 3.0 cm guide catheter along with the Abbott FloWire.  The DFR was 0.95 suggesting that the proximal ramus branch stenosis was not physiologically significant.  The LAD stents were widely patent.  The culprit lesion was progression of disease in the PDA. Using a 6 [place] JR4 guide catheter along the 0.14 Prowater guidewire I predilated with a 2 mm x 12 mm balloon the proximal PDA lesion.  I then attempted to get a 2.5 x 16 mm long Synergy drug-eluting stent across the proximal band unsuccessfully.  Even with a guide liner this same stent would not track.  I then changed to a 2.5 x 12 mm long Medtronic Onyx frontier drug-eluting stent which navigated the RCA with some difficulty.  I carefully positioned this in the proximal portion of the PDA and deployed at 16 atm (2.7 mm) resulting in reduction of a 95% proximal PDA stenosis to 0% residual.   Patient tolerated the procedure well without hemodynamic or electrocardiographic sequelae.   Cardiac catheterization revealed widely patent LAD stents.  The proximal ramus branch stenosis was negative by DFR suggesting it was not physiologically significant.  The PDA lesion demonstrated on prior cath in March had progressed and was most likely the culprit lesion which was successfully stented with a 2.5 mm x 12 mm long Medtronic Onyx frontier drug-eluting stent deployed at 2.7 mm.  The patient tolerated procedure well.  The guidewire and catheter were removed.  The radial sheath was removed and a Zephyr wristband was placed on the patient's wrist to achieve patent hemostasis.  The patient left lab in stable condition..  Plans will be for hydration overnight and discharge home in the morning.  He left the lab in stable condition. Quay Burow. MD, [facility] 09/19/2021 12:01 PM    ECHOCARDIOGRAM COMPLETE  Result Date: 09/05/2021    ECHOCARDIOGRAM REPORT   Patient Name:   Jeffrey Acevedo Date of Exam: 09/05/2021 Medical Rec #:  [id number]       Height:       71.0 in Accession #:    [id number]      Weight:       208.2 lb Date of Birth:  1940-06-27       BSA:          2.145 m Patient Age:    81 years        BP:           110/70 mmHg Patient Gender: M               HR:           62 bpm. Exam Location:  Outpatient Procedure: 2D Echo, Color Doppler, Cardiac Doppler and Strain Analysis Indications:    Coronary artery disease involving native coronary artery of                 native heart with unstable angina pectoris  History:        Patient has prior history of Echocardiogram examinations, most  of the case (145 cc of contrast total to patient).  Retrograde aortic, ventricular and pullback pressures were recorded.  Radial cocktail was administered via the SideArm sheath. Total contrast administered to the patient was 12,000 with an ACT of 269.  Isovue dye was used for the entirety of the intervention.  The patient received an additional 300 mg of p.o. clopidogrel. I initially performed DFR of the ramus branch using a 6 French XB 3.0 cm guide catheter along with the Abbott FloWire.  The DFR was 0.95 suggesting that the proximal ramus branch stenosis was not physiologically significant.  The LAD stents were widely patent.  The culprit lesion was progression of disease in the PDA. Using a 6 [place] JR4 guide catheter along the 0.14 Prowater guidewire I predilated with a 2 mm x 12 mm balloon the proximal PDA lesion.  I then attempted to get a 2.5 x 16 mm long Synergy drug-eluting stent across the proximal band unsuccessfully.  Even with a guide liner this same stent would not track.  I then changed to a 2.5 x 12 mm long Medtronic Onyx frontier drug-eluting stent which navigated the RCA with some difficulty.  I carefully positioned this in the proximal portion of the PDA and deployed at 16 atm (2.7 mm) resulting in reduction of a 95% proximal PDA stenosis to 0% residual.   Patient tolerated the procedure well without hemodynamic or electrocardiographic sequelae.   Cardiac catheterization revealed widely patent LAD stents.  The proximal ramus branch stenosis was negative by DFR suggesting it was not physiologically significant.  The PDA lesion demonstrated on prior cath in March had progressed and was most likely the culprit lesion which was successfully stented with a 2.5 mm x 12 mm long Medtronic Onyx frontier drug-eluting stent deployed at 2.7 mm.  The patient tolerated procedure well.  The guidewire and catheter were removed.  The radial sheath was removed and a Zephyr wristband was placed on the patient's wrist to achieve patent hemostasis.  The patient left lab in stable condition..  Plans will be for hydration overnight and discharge home in the morning.  He left the lab in stable condition. Quay Burow. MD, [facility] 09/19/2021 12:01 PM    ECHOCARDIOGRAM COMPLETE  Result Date: 09/05/2021    ECHOCARDIOGRAM REPORT   Patient Name:   Jeffrey Acevedo Date of Exam: 09/05/2021 Medical Rec #:  [id number]       Height:       71.0 in Accession #:    [id number]      Weight:       208.2 lb Date of Birth:  1940-06-27       BSA:          2.145 m Patient Age:    81 years        BP:           110/70 mmHg Patient Gender: M               HR:           62 bpm. Exam Location:  Outpatient Procedure: 2D Echo, Color Doppler, Cardiac Doppler and Strain Analysis Indications:    Coronary artery disease involving native coronary artery of                 native heart with unstable angina pectoris  History:        Patient has prior history of Echocardiogram examinations, most  of the case (145 cc of contrast total to patient).  Retrograde aortic, ventricular and pullback pressures were recorded.  Radial cocktail was administered via the SideArm sheath. Total contrast administered to the patient was 12,000 with an ACT of 269.  Isovue dye was used for the entirety of the intervention.  The patient received an additional 300 mg of p.o. clopidogrel. I initially performed DFR of the ramus branch using a 6 French XB 3.0 cm guide catheter along with the Abbott FloWire.  The DFR was 0.95 suggesting that the proximal ramus branch stenosis was not physiologically significant.  The LAD stents were widely patent.  The culprit lesion was progression of disease in the PDA. Using a 6 [place] JR4 guide catheter along the 0.14 P[place]ater guidewire I predilated with a 2 mm x 12 mm balloon the proximal PDA lesion.  I then attempted to get a 2.5 x 16 mm long Synergy drug-eluting stent across the proximal band unsuccessfully.  Even with a guide liner this same stent would not track.  I then changed to a 2.5 x 12 mm long Medtronic Onyx frontier drug-eluting stent which navigated the RCA with some difficulty.  I carefully positioned this in the proximal portion of the PDA and deployed at 16 atm (2.7 mm) resulting in reduction of a 95% proximal PDA stenosis to 0% residual.   Patient tolerated the procedure well without hemodynamic or electrocardiographic sequelae.   Cardiac catheterization revealed widely patent LAD stents.  The proximal ramus branch stenosis was negative by DFR suggesting it was not physiologically significant.  The PDA lesion demonstrated on prior cath in March had progressed and was most likely the culprit lesion which was successfully stented with a 2.5 mm x 12 mm long Medtronic Onyx frontier drug-eluting stent deployed at 2.7 mm.  The patient tolerated procedure well.  The guidewire and catheter were removed.  The radial sheath was removed and a Zephyr wristband was placed on the patient's wrist to achieve patent hemostasis.  The patient left lab in stable condition..  Plans will be for hydration overnight and discharge home in the morning.  He left the lab in stable condition. Quay Burow. MD, [facility] 09/19/2021 12:01 PM    ECHOCARDIOGRAM COMPLETE  Result Date: 09/05/2021    ECHOCARDIOGRAM REPORT   Patient Name:   Jeffrey Acevedo Date of Exam: 09/05/2021 Medical Rec #:  [id number]       Height:       71.0 in Accession #:    [id number]      Weight:       208.2 lb Date of Birth:  1940-06-27       BSA:          2.145 m Patient Age:    81 years        BP:           110/70 mmHg Patient Gender: M               HR:           62 bpm. Exam Location:  Outpatient Procedure: 2D Echo, Color Doppler, Cardiac Doppler and Strain Analysis Indications:    Coronary artery disease involving native coronary artery of                 native heart with unstable angina pectoris  History:        Patient has prior history of Echocardiogram examinations, most  Discharge Summary    Patient ID: Jeffrey Acevedo MRN: [id number]; DOB: 1940/05/05  Admit date: 09/19/2021 Discharge date: 09/20/2021  PCP:  Raina Mina., MD   [facility] Providers Cardiologist:  Quay Burow, MD      Discharge Diagnoses    Principal Problem:   CAD (coronary artery disease) Active Problems:   Essential hypertension   Hyperlipidemia  Diagnostic Studies/Procedures    Cath: 09/19/21    Prox Cx to Mid Cx lesion is 40% stenosed.   Ost RCA to Prox RCA lesion is 30% stenosed.   Ramus lesion is 60% stenosed.   RPDA lesion is 90% stenosed.   Non-stenotic Prox LAD to Mid LAD lesion was previously treated.   Non-stenotic Mid LAD-2 lesion was previously treated.   Non-stenotic Mid LAD-1 lesion was previously treated.   A drug-eluting stent was successfully placed using a STENT ONYX FRONTIER 2.5X12.   Post intervention, there is a 0% residual stenosis.  IMPRESSION: Cardiac catheterization revealed widely patent LAD stents.  The proximal ramus branch stenosis was negative by DFR suggesting it was not physiologically significant.  The PDA lesion demonstrated on prior cath in March had progressed and was most likely the culprit lesion which was successfully stented with a 2.5 mm x 12 mm long Medtronic Onyx frontier drug-eluting stent deployed at 2.7 mm.  The patient tolerated procedure well.  The guidewire and catheter were removed.  The radial sheath was removed and a Zephyr wristband was placed on the patient's wrist to achieve patent hemostasis.  The patient left lab in stable condition..  Plans will be for hydration overnight and discharge home in the morning.  He left the lab in stable condition.   Quay Burow. MD, [facility] 09/19/2021 12:01 PM _____________   History of Present Illness     Jeffrey Acevedo is a 81 y.o. male with a history of CAD, status post LAD stenting by Dr. Gwenlyn Found using bare-metal stent April 16, 1996. He had normal LV function at that time with  normal renal arteries as well. His other problems include essential hypertension, hyperlipidemia. He had a normal Myoview February 2011 and was scheduled to have elective robotic prostatectomy for prostate cancer by Dr. Dutch Gray. He developed exertional chest pain similar to his pre-stent symptoms, which were fairly reproducible and because of this, and despite a negative Myoview, I elected to recatheterize him June 17, 2009, revealing a patent proximal LAD stent with 90% stenosis just beyond this, which I then stented with a Multi-Link Vision bare-metal stent with an excellent result. He did have a Myoview stress test performed 03/31/15 which was entirely normal. This was performed for preoperative clearance before a right total hip replacement on 04/19/15 which was uneventful.  Dr. Gwenlyn Found performed outpatient diagnostic coronary angiography via the right radial approach on 05/02/2021 revealing patent proximal and mid LAD stents with a 70% proximal to mid LAD stenosis that was physiologically significant by FFR. Placed a 2.5 mm x 12 mm long Medtronic frontier drug-eluting stent postdilated with a 2.75 mm balloon (2.8 mm).  He also had a 60% tubular proximal PDA stenosis off of the RCA which was negative by DFR (0.94).  He was discharged home the following day.    He was seen 3 weeks prior to office visit and had a 2D echo performed 09/05/2021 which was normal.  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  He also complains of tachypalpitations.  After consideration of all options it was jointly  of the case (145 cc of contrast total to patient).  Retrograde aortic, ventricular and pullback pressures were recorded.  Radial cocktail was administered via the SideArm sheath. Total contrast administered to the patient was 12,000 with an ACT of 269.  Isovue dye was used for the entirety of the intervention.  The patient received an additional 300 mg of p.o. clopidogrel. I initially performed DFR of the ramus branch using a 6 French XB 3.0 cm guide catheter along with the Abbott FloWire.  The DFR was 0.95 suggesting that the proximal ramus branch stenosis was not physiologically significant.  The LAD stents were widely patent.  The culprit lesion was progression of disease in the PDA. Using a 6 [place] JR4 guide catheter along the 0.14 Prowater guidewire I predilated with a 2 mm x 12 mm balloon the proximal PDA lesion.  I then attempted to get a 2.5 x 16 mm long Synergy drug-eluting stent across the proximal band unsuccessfully.  Even with a guide liner this same stent would not track.  I then changed to a 2.5 x 12 mm long Medtronic Onyx frontier drug-eluting stent which navigated the RCA with some difficulty.  I carefully positioned this in the proximal portion of the PDA and deployed at 16 atm (2.7 mm) resulting in reduction of a 95% proximal PDA stenosis to 0% residual.   Patient tolerated the procedure well without hemodynamic or electrocardiographic sequelae.   Cardiac catheterization revealed widely patent LAD stents.  The proximal ramus branch stenosis was negative by DFR suggesting it was not physiologically significant.  The PDA lesion demonstrated on prior cath in March had progressed and was most likely the culprit lesion which was successfully stented with a 2.5 mm x 12 mm long Medtronic Onyx frontier drug-eluting stent deployed at 2.7 mm.  The patient tolerated procedure well.  The guidewire and catheter were removed.  The radial sheath was removed and a Zephyr wristband was placed on the patient's wrist to achieve patent hemostasis.  The patient left lab in stable condition..  Plans will be for hydration overnight and discharge home in the morning.  He left the lab in stable condition. Quay Burow. MD, [facility] 09/19/2021 12:01 PM    ECHOCARDIOGRAM COMPLETE  Result Date: 09/05/2021    ECHOCARDIOGRAM REPORT   Patient Name:   Jeffrey Acevedo Date of Exam: 09/05/2021 Medical Rec #:  [id number]       Height:       71.0 in Accession #:    [id number]      Weight:       208.2 lb Date of Birth:  1940-06-27       BSA:          2.145 m Patient Age:    81 years        BP:           110/70 mmHg Patient Gender: M               HR:           62 bpm. Exam Location:  Outpatient Procedure: 2D Echo, Color Doppler, Cardiac Doppler and Strain Analysis Indications:    Coronary artery disease involving native coronary artery of                 native heart with unstable angina pectoris  History:        Patient has prior history of Echocardiogram examinations, most  Mid LAD-1 lesion was previously treated.   A drug-eluting stent was successfully placed using a STENT ONYX FRONTIER 2.5X12.   Post intervention, there is a 0% residual stenosis. Jeffrey Acevedo is a 81 y.o. male  [id number] LOCATION:  FACILITY: [facility] PHYSICIAN: Quay Burow, M.D. [birth year] DATE OF PROCEDURE:  09/19/2021 DATE OF DISCHARGE: CARDIAC CATHETERIZATION / PCI DES PDA History obtained from chart review.Jeffrey Acevedo is a 81 y.o.   overweight married Caucasian male, father of 2 and grandfather of 4 grandchildren, whom I last saw in the office 08/23/2021.[name]  He is accompanied by his daughter Maudie Mercury and his wife Juliann Pulse today.  He has a history of CAD, status post LAD stenting by me using bare-metal stent April 16, 1996. He had normal LV function at that time with normal renal arteries as well. His other problems include essential hypertension, hyperlipidemia. He had a normal Myoview February 2011 and was scheduled to have elective robotic prostatectomy for prostate cancer by  Dr. Dutch Gray. He developed exertional chest pain similar to his pre-stent symptoms, which were fairly reproducible and because of this, and despite a negative Myoview, I elected to recatheterize him June 17, 2009, revealing a patent proximal LAD stent with 90% stenosis just beyond this, which I then stented with a Multi-Link Vision bare-metal stent with an excellent result. Since I saw him a year ago he's been completely asymptomatic. He did have a Myoview stress test performed 03/31/15 which was entirely normal.. This was performed for preoperative clearance before a right total hip replacement on 04/19/15 which was uneventful.  Because of symptoms of dyspnea and palpitations, I did get a 2D echo which was unremarkable, Myoview stress test which was nonischemic and a event monitor that showed frequent PVCs and short runs of nonsustained ventricular tachycardia and SVT.  He clearly is symptomatic.  Because his last stress test in 2011 was falsely negative and cath subsequently revealed a 90% LAD lesion we decided jointly to proceed with outpatient diagnostic coronary angiography I performed outpatient diagnostic coronary angiography via the right radial approach on 05/02/2021 revealing patent proximal and mid LAD stents with a 70% proximal to mid LAD stenosis that was physiologically significant by FFR.  I placed a 2.5 mm x 12 mm long Medtronic frontier drug-eluting stent postdilated with a 2.75 mm balloon (2.8 mm).  He also had a 60% tubular proximal PDA stenosis off of the RCA which was negative by DFR (0.94).  He was discharged home the following day.  He did develop COVID that day which has taken 2 weeks to resolve his symptoms of shortness of breath.  Since I saw him 3 weeks ago he did have a 2D echo performed 09/05/2021 which was normal.  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  He also complains of tachypalpitations.  After consideration of all options it was jointly decided to proceed  with outpatient diagnostic coronary angiography. PROCEDURE DESCRIPTION: The patient was brought to the second floor [facility] cath lab in the postabsorptive state. He was premedicated with IV Versed and fentanyl. His right wrist was prepped and shaved in usual sterile fashion. Xylocaine 1% was used for local anesthesia. A 6 French sheath was inserted into the right radial artery using standard Seldinger technique. The patient received 5000 units  of heparin intravenously.  A 5 [place] TIG catheter and right Judkins catheters were used for selective coronary angiography and obtain left heart pressures.  Isovue dye is used for the entirety  Mid LAD-1 lesion was previously treated.   A drug-eluting stent was successfully placed using a STENT ONYX FRONTIER 2.5X12.   Post intervention, there is a 0% residual stenosis. Jeffrey Acevedo is a 81 y.o. male  [id number] LOCATION:  FACILITY: [facility] PHYSICIAN: Quay Burow, M.D. [birth year] DATE OF PROCEDURE:  09/19/2021 DATE OF DISCHARGE: CARDIAC CATHETERIZATION / PCI DES PDA History obtained from chart review.Jeffrey Acevedo is a 81 y.o.   overweight married Caucasian male, father of 2 and grandfather of 4 grandchildren, whom I last saw in the office 08/23/2021.[name]  He is accompanied by his daughter Maudie Mercury and his wife Juliann Pulse today.  He has a history of CAD, status post LAD stenting by me using bare-metal stent April 16, 1996. He had normal LV function at that time with normal renal arteries as well. His other problems include essential hypertension, hyperlipidemia. He had a normal Myoview February 2011 and was scheduled to have elective robotic prostatectomy for prostate cancer by  Dr. Dutch Gray. He developed exertional chest pain similar to his pre-stent symptoms, which were fairly reproducible and because of this, and despite a negative Myoview, I elected to recatheterize him June 17, 2009, revealing a patent proximal LAD stent with 90% stenosis just beyond this, which I then stented with a Multi-Link Vision bare-metal stent with an excellent result. Since I saw him a year ago he's been completely asymptomatic. He did have a Myoview stress test performed 03/31/15 which was entirely normal.. This was performed for preoperative clearance before a right total hip replacement on 04/19/15 which was uneventful.  Because of symptoms of dyspnea and palpitations, I did get a 2D echo which was unremarkable, Myoview stress test which was nonischemic and a event monitor that showed frequent PVCs and short runs of nonsustained ventricular tachycardia and SVT.  He clearly is symptomatic.  Because his last stress test in 2011 was falsely negative and cath subsequently revealed a 90% LAD lesion we decided jointly to proceed with outpatient diagnostic coronary angiography I performed outpatient diagnostic coronary angiography via the right radial approach on 05/02/2021 revealing patent proximal and mid LAD stents with a 70% proximal to mid LAD stenosis that was physiologically significant by FFR.  I placed a 2.5 mm x 12 mm long Medtronic frontier drug-eluting stent postdilated with a 2.75 mm balloon (2.8 mm).  He also had a 60% tubular proximal PDA stenosis off of the RCA which was negative by DFR (0.94).  He was discharged home the following day.  He did develop COVID that day which has taken 2 weeks to resolve his symptoms of shortness of breath.  Since I saw him 3 weeks ago he did have a 2D echo performed 09/05/2021 which was normal.  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  He also complains of tachypalpitations.  After consideration of all options it was jointly decided to proceed  with outpatient diagnostic coronary angiography. PROCEDURE DESCRIPTION: The patient was brought to the second floor [facility] lab in the postabsorptive state. He was premedicated with IV Versed and fentanyl. His right wrist was prepped and shaved in usual sterile fashion. Xylocaine 1% was used for local anesthesia. A 6 French sheath was inserted into the right radial artery using standard Seldinger technique. The patient received 5000 units  of heparin intravenously.  A 5 [place] TIG catheter and right Judkins catheters were used for selective coronary angiography and obtain left heart pressures.  Isovue dye is used for the entirety  Discharge Summary    Patient ID: Jeffrey Acevedo MRN: [id number]; DOB: 1940/05/05  Admit date: 09/19/2021 Discharge date: 09/20/2021  PCP:  Raina Mina., MD   [facility] Providers Cardiologist:  Quay Burow, MD      Discharge Diagnoses    Principal Problem:   CAD (coronary artery disease) Active Problems:   Essential hypertension   Hyperlipidemia  Diagnostic Studies/Procedures    Cath: 09/19/21    Prox Cx to Mid Cx lesion is 40% stenosed.   Ost RCA to Prox RCA lesion is 30% stenosed.   Ramus lesion is 60% stenosed.   RPDA lesion is 90% stenosed.   Non-stenotic Prox LAD to Mid LAD lesion was previously treated.   Non-stenotic Mid LAD-2 lesion was previously treated.   Non-stenotic Mid LAD-1 lesion was previously treated.   A drug-eluting stent was successfully placed using a STENT ONYX FRONTIER 2.5X12.   Post intervention, there is a 0% residual stenosis.  IMPRESSION: Cardiac catheterization revealed widely patent LAD stents.  The proximal ramus branch stenosis was negative by DFR suggesting it was not physiologically significant.  The PDA lesion demonstrated on prior cath in March had progressed and was most likely the culprit lesion which was successfully stented with a 2.5 mm x 12 mm long Medtronic Onyx frontier drug-eluting stent deployed at 2.7 mm.  The patient tolerated procedure well.  The guidewire and catheter were removed.  The radial sheath was removed and a Zephyr wristband was placed on the patient's wrist to achieve patent hemostasis.  The patient left lab in stable condition..  Plans will be for hydration overnight and discharge home in the morning.  He left the lab in stable condition.   Quay Burow. MD, [facility] 09/19/2021 12:01 PM _____________   History of Present Illness     Jeffrey Acevedo is a 81 y.o. male with a history of CAD, status post LAD stenting by Dr. Gwenlyn Found using bare-metal stent April 16, 1996. He had normal LV function at that time with  normal renal arteries as well. His other problems include essential hypertension, hyperlipidemia. He had a normal Myoview February 2011 and was scheduled to have elective robotic prostatectomy for prostate cancer by Dr. Dutch Gray. He developed exertional chest pain similar to his pre-stent symptoms, which were fairly reproducible and because of this, and despite a negative Myoview, I elected to recatheterize him June 17, 2009, revealing a patent proximal LAD stent with 90% stenosis just beyond this, which I then stented with a Multi-Link Vision bare-metal stent with an excellent result. He did have a Myoview stress test performed 03/31/15 which was entirely normal. This was performed for preoperative clearance before a right total hip replacement on 04/19/15 which was uneventful.  Dr. Gwenlyn Found performed outpatient diagnostic coronary angiography via the right radial approach on 05/02/2021 revealing patent proximal and mid LAD stents with a 70% proximal to mid LAD stenosis that was physiologically significant by FFR. Placed a 2.5 mm x 12 mm long Medtronic frontier drug-eluting stent postdilated with a 2.75 mm balloon (2.8 mm).  He also had a 60% tubular proximal PDA stenosis off of the RCA which was negative by DFR (0.94).  He was discharged home the following day.    He was seen 3 weeks prior to office visit and had a 2D echo performed 09/05/2021 which was normal.  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  He also complains of tachypalpitations.  After consideration of all options it was jointly

## 2021-09-21 ENCOUNTER — Telehealth (HOSPITAL_COMMUNITY): Payer: Self-pay

## 2021-09-21 ENCOUNTER — Encounter (HOSPITAL_COMMUNITY): Payer: Self-pay | Admitting: Cardiovascular Disease

## 2021-09-21 LAB — LIPOPROTEIN A (LPA): Lipoprotein (a): 63.4 nmol/L — ABNORMAL HIGH (ref <75.0)

## 2021-09-21 NOTE — Telephone Encounter (Signed)
Per phase I cardiac rehab, fax cardiac rehab referral to [place] [facility]. °

## 2021-09-22 MED FILL — Nitroglycerin IV Soln 100 MCG/ML in D5W: INTRA_ARTERIAL | Qty: 10 | Status: AC

## 2021-10-19 ENCOUNTER — Encounter: Payer: Self-pay | Admitting: Cardiovascular Disease

## 2021-10-19 ENCOUNTER — Ambulatory Visit: Payer: Medicare PPO | Admitting: Cardiovascular Disease

## 2021-10-19 VITALS — BP 128/80 | HR 49 | Ht 71.5 in

## 2021-10-19 DIAGNOSIS — Z955 Presence of coronary angioplasty implant and graft: Secondary | ICD-10-CM | POA: Diagnosis not present

## 2021-10-19 NOTE — Progress Notes (Signed)
10/19/2021 WICK MOSELY   January 14, 1941  [id number]  Primary Physician [name], MD Primary Cardiologist: Runell Gess MD FACP, [place], [place], [place]  HPI:  Jeffrey Acevedo is a 81 y.o.  overweight married Caucasian male, father of 2 and grandfather of 4 grandchildren, whom I last saw in the office 09/16/2021.[name]   He has a history of CAD, status post LAD stenting by me using bare-metal stent April 16, 1996. He had normal LV function at that time with normal renal arteries as well. His other problems include essential hypertension, hyperlipidemia. He had a normal Myoview February 2011 and was scheduled to have elective robotic prostatectomy for prostate cancer by Dr. Crecencio Mc. He developed exertional chest pain similar to his pre-stent symptoms, which were fairly reproducible and because of this, and despite a negative Myoview, I elected to recatheterize him June 17, 2009, revealing a patent proximal LAD stent with 90% stenosis just beyond this, which I then stented with a Multi-Link Vision bare-metal stent with an excellent result. Since I saw him a year ago he's been completely asymptomatic. He did have a Myoview stress test performed 03/31/15 which was entirely normal.. This was performed for preoperative clearance before a right total hip replacement on 04/19/15 which was uneventful.   Because of symptoms of dyspnea and palpitations, I did get a 2D echo which was unremarkable, Myoview stress test which was nonischemic and a event monitor that showed frequent PVCs and short runs of nonsustained ventricular tachycardia and SVT.  He clearly is symptomatic.  Because his last stress test in 2011 was falsely negative and cath subsequently revealed a 90% LAD lesion we decided jointly to proceed with outpatient diagnostic coronary angiography  I performed outpatient diagnostic coronary angiography via the right radial approach on 05/02/2021 revealing patent proximal and mid LAD stents with a 70%  proximal to mid LAD stenosis that was physiologically significant by FFR.  I placed a 2.5 mm x 12 mm long Medtronic frontier drug-eluting stent postdilated with a 2.75 mm balloon (2.8 mm).  He also had a 60% tubular proximal PDA stenosis off of the RCA which was negative by DFR (0.94).  He was discharged home the following day.  He did develop COVID that day which has taken 2 weeks to resolve his symptoms of shortness of breath.   Because of continued nitrate responsive chest pain I decided to cath him on 09/19/2021.  I performed DFR of the ramus branch which was negative and I ended up stenting his proximal PDA with a 2.5 x 12 mm long Medtronic Onyx frontier drug-eluting stent deployed at 2.7 mm.  He was discharged home the following day.  His symptoms have completely resolved.  Current Meds  Medication Sig   amLODipine (NORVASC) 10 MG tablet Take 1 tablet by mouth once daily (Patient taking differently: Take 10 mg by mouth daily.)   aspirin EC 81 MG EC tablet Take 1 tablet (81 mg total) by mouth daily. Swallow whole.   Choline Fenofibrate (FENOFIBRIC ACID) 45 MG CPDR Take 1 capsule by mouth once daily (Patient taking differently: Take 45 mg by mouth daily.)   clobetasol ointment (TEMOVATE) 0.05 % Apply 1 Application topically 2 (two) times daily as needed (hives).   clopidogrel (PLAVIX) 75 MG tablet TAKE ONE TABLET BY MOUTH ONCE DAILY (Patient taking differently: Take 75 mg by mouth daily.)   clotrimazole-betamethasone (LOTRISONE) cream Apply 1 Application topically 2 (two) times daily as needed (skin irritation.).  Cyanocobalamin (VITAMIN B-12 PO) Take 1 tablet by mouth daily.   irbesartan-hydrochlorothiazide (AVALIDE) 300-12.5 MG tablet Take 1 tablet by mouth once daily (Patient taking differently: Take 1 tablet by mouth daily.)   isosorbide mononitrate (IMDUR) 30 MG 24 hr tablet Take 0.5 tablets (15 mg total) by mouth daily.   ketoconazole (NIZORAL) 2 % shampoo Apply 1 application topically daily  as needed (scalp irritation.).   metoprolol tartrate (LOPRESSOR) 25 MG tablet Take 1 tablet by mouth once daily (Patient taking differently: Take 25 mg by mouth daily.)   nitroGLYCERIN (NITROSTAT) 0.4 MG SL tablet Place 1 tablet (0.4 mg total) under the tongue every 5 (five) minutes as needed for chest pain.   nystatin cream (MYCOSTATIN) Apply 1 application topically daily as needed (skin irritation.).   Omega-3 Fatty Acids (FISH OIL) 1000 MG CAPS Take 1,000 mg by mouth every other day. In the evening, alternating with fenofibrate acid   rosuvastatin (CRESTOR) 40 MG tablet Take 1 tablet (40 mg total) by mouth daily.   spironolactone (ALDACTONE) 25 MG tablet Take 1 tablet by mouth once daily (Patient taking differently: Take 25 mg by mouth daily.)   triamcinolone cream (KENALOG) 0.1 % Apply 1 application topically daily as needed (skin irritation.).     Allergies  Allergen Reactions   Adhesive [Tape] Other (See Comments)    Mainly just redness.   Has been ruled out for latex allegery   Hydrochlorothiazide Other (See Comments)    H/o Pancreatitis, takes in Avalide at home   Latex Other (See Comments)    unknown   Vantin [Cefpodoxime] Rash    Social History   Socioeconomic History   Marital status: Married    Spouse name: Not on file   Number of children: Not on file   Years of education: Not on file   Highest education level: Not on file  Occupational History   Not on file  Tobacco Use   Smoking status: Never   Smokeless tobacco: Never  Substance and Sexual Activity   Alcohol use: No   Drug use: No   Sexual activity: Not on file  Other Topics Concern   Not on file  Social History Narrative   Not on file   Social Determinants of Health   Financial Resource Strain: Not on file  Food Insecurity: Not on file  Transportation Needs: Not on file  Physical Activity: Not on file  Stress: Not on file  Social Connections: Not on file  Intimate Partner Violence: Not on file      Review of Systems: General: negative for chills, fever, night sweats or weight changes.  Cardiovascular: negative for chest pain, dyspnea on exertion, edema, orthopnea, palpitations, paroxysmal nocturnal dyspnea or shortness of breath Dermatological: negative for rash Respiratory: negative for cough or wheezing Urologic: negative for hematuria Abdominal: negative for nausea, vomiting, diarrhea, bright red blood per rectum, melena, or hematemesis Neurologic: negative for visual changes, syncope, or dizziness All other systems reviewed and are otherwise negative except as noted above.    Blood pressure 128/80, pulse (!) 49, height 5' 11.5" (1.816 m).  General appearance: alert and no distress Neck: no adenopathy, no carotid bruit, no JVD, supple, symmetrical, trachea midline, and thyroid not enlarged, symmetric, no tenderness/mass/nodules Lungs: clear to auscultation bilaterally Heart: regular rate and rhythm, S1, S2 normal, no murmur, click, rub or gallop Extremities: extremities normal, atraumatic, no cyanosis or edema Pulses: 2+ and symmetric Skin: Skin color, texture, turgor normal. No rashes or lesions Neurologic: Grossly normal  EKG sinus bradycardia 49 without ST or T wave changes.  I personally reviewed this EKG.  ASSESSMENT AND PLAN:   Status post coronary artery stent placement Jeffrey Acevedo returns today for follow-up of his recent outpatient coronary intervention.  I originally stented him back in January 1998 of his proximal LAD.  I restented him 05/02/2021 of his LAD.  Because of recurrent symptoms I recath him 09/19/2021 and performed PCI and drug-eluting stenting of the proximal PDA.  I did perform DFR of the ramus branch which was negative.  His LAD stent was widely patent.  His symptoms have completely resolved.     Runell Gess MD FACP,FACC,FAHA, [facility] 10/19/2021 11:49 AM

## 2021-10-19 NOTE — Patient Instructions (Signed)
Medication Instructions:  No Changes In Medications at this time.  *If you need a refill on your cardiac medications before your next appointment, please call your pharmacy*  Lab Work: None Ordered At This Time.  If you have labs (blood work) drawn today and your tests are completely normal, you will receive your results only by: [place] (if you have MyChart) OR A paper copy in the mail If you have any lab test that is abnormal or we need to change your treatment, we will call you to review the results.  Testing/Procedures: None Ordered At This Time.   Follow-Up: At [facility], you and your health needs are our priority.  As part of our continuing mission to provide you with exceptional heart care, we have created designated Provider Care Teams.  These Care Teams include your primary Cardiologist (physician) and Advanced Practice Providers (APPs -  Physician Assistants and Nurse Practitioners) who all work together to provide you with the care you need, when you need it.  Your next appointment:   6 month(s)  The format for your next appointment:   In Person  Provider:   Quay Burow, MD

## 2021-10-19 NOTE — Assessment & Plan Note (Signed)
Jeffrey Acevedo returns today for follow-up of his recent outpatient coronary intervention.  I originally stented him back in January 1998 of his proximal LAD.  I restented him 05/02/2021 of his LAD.  Because of recurrent symptoms I recath him 09/19/2021 and performed PCI and drug-eluting stenting of the proximal PDA.  I did perform DFR of the ramus branch which was negative.  His LAD stent was widely patent.  His symptoms have completely resolved.

## 2021-12-06 ENCOUNTER — Ambulatory Visit: Payer: Medicare PPO | Admitting: Cardiovascular Disease

## 2021-12-09 ENCOUNTER — Other Ambulatory Visit: Payer: Self-pay | Admitting: Cardiovascular Disease

## 2022-02-06 ENCOUNTER — Other Ambulatory Visit: Payer: Self-pay | Admitting: Cardiovascular Disease

## 2022-04-18 ENCOUNTER — Encounter: Payer: Self-pay | Admitting: Cardiovascular Disease

## 2022-04-18 ENCOUNTER — Ambulatory Visit: Payer: Medicare PPO | Attending: Cardiovascular Disease | Admitting: Cardiovascular Disease

## 2022-04-18 VITALS — BP 128/82 | HR 47 | Ht 71.0 in | Wt 207.0 lb

## 2022-04-18 DIAGNOSIS — Z955 Presence of coronary angioplasty implant and graft: Secondary | ICD-10-CM | POA: Diagnosis not present

## 2022-04-18 DIAGNOSIS — I10 Essential (primary) hypertension: Secondary | ICD-10-CM | POA: Diagnosis not present

## 2022-04-18 DIAGNOSIS — I2583 Coronary atherosclerosis due to lipid rich plaque: Secondary | ICD-10-CM

## 2022-04-18 DIAGNOSIS — E782 Mixed hyperlipidemia: Secondary | ICD-10-CM

## 2022-04-18 DIAGNOSIS — I2510 Atherosclerotic heart disease of native coronary artery without angina pectoris: Secondary | ICD-10-CM | POA: Diagnosis not present

## 2022-04-18 NOTE — Assessment & Plan Note (Signed)
History of essential hypertension a blood pressure measured today at 128/82.  He is on amlodipine, Avalide and metoprolol as well as Aldactone.

## 2022-04-18 NOTE — Patient Instructions (Signed)
Medication Instructions:  Your physician recommends that you continue on your current medications as directed. Please refer to the Current Medication list given to you today.  *If you need a refill on your cardiac medications before your next appointment, please call your pharmacy*   Lab Work: Your physician recommends that you have labs drawn today: Lipid/liver panel  If you have labs (blood work) drawn today and your tests are completely normal, you will receive your results only by: MyChart Message (if you have MyChart) OR A paper copy in the mail If you have any lab test that is abnormal or we need to change your treatment, we will call you to review the results.   Follow-Up: At [facility], you and your health needs are our priority.  As part of our continuing mission to provide you with exceptional heart care, we have created designated Provider Care Teams.  These Care Teams include your primary Cardiologist (physician) and Advanced Practice Providers (APPs -  Physician Assistants and Nurse Practitioners) who all work together to provide you with the care you need, when you need it.  We recommend signing up for the patient portal called "MyChart".  Sign up information is provided on this After Visit Summary.  MyChart is used to connect with patients for Virtual Visits (Telemedicine).  Patients are able to view lab/test results, encounter notes, upcoming appointments, etc.  Non-urgent messages can be sent to your provider as well.   To learn more about what you can do with MyChart, go to [email or web address].    Your next appointment:   6 month(s)  Provider:   Quay Burow, MD

## 2022-04-18 NOTE — Progress Notes (Signed)
04/18/2022 [name]   10/23/1940  [id number]  Primary Physician Jeffrey Acevedo., MD Primary Cardiologist: Jeffrey Gess MD FACP, [place], [place], [place]  HPI:  Jeffrey Acevedo is a 82 y.o.  overweight married Caucasian male, father of 2 and grandfather of 4 grandchildren, whom I last saw in the office 10/19/2021.[name]   He has a history of CAD, status post LAD stenting by me using bare-metal stent April 16, 1996. He had normal LV function at that time with normal renal arteries as well. His other problems include essential hypertension, hyperlipidemia. He had a normal Myoview February 2011 and was scheduled to have elective robotic prostatectomy for prostate cancer by Dr. Crecencio Acevedo. He developed exertional chest pain similar to his pre-stent symptoms, which were fairly reproducible and because of this, and despite a negative Myoview, I elected to recatheterize him June 17, 2009, revealing a patent proximal LAD stent with 90% stenosis just beyond this, which I then stented with a Multi-Link Vision bare-metal stent with an excellent result. Since I saw him a year ago he's been completely asymptomatic. He did have a Myoview stress test performed 03/31/15 which was entirely normal.. This was performed for preoperative clearance before a right total hip replacement on 04/19/15 which was uneventful.   Because of symptoms of dyspnea and palpitations, I did get a 2D echo which was unremarkable, Myoview stress test which was nonischemic and a event monitor that showed frequent PVCs and short runs of nonsustained ventricular tachycardia and SVT.  He clearly is symptomatic.  Because his last stress test in 2011 was falsely negative and cath subsequently revealed a 90% LAD lesion we decided jointly to proceed with outpatient diagnostic coronary angiography  I performed outpatient diagnostic coronary angiography via the right radial approach on 05/02/2021 revealing patent proximal and mid LAD stents with a 70%  proximal to mid LAD stenosis that was physiologically significant by FFR.  I placed a 2.5 mm x 12 mm long Medtronic frontier drug-eluting stent postdilated with a 2.75 mm balloon (2.8 mm).  He also had a 60% tubular proximal PDA stenosis off of the RCA which was negative by DFR (0.94).  He was discharged home the following day.  He did develop COVID that day which has taken 2 weeks to resolve his symptoms of shortness of breath.   Because of continued nitrate responsive chest pain I decided to cath him on 09/19/2021.  I performed DFR of the ramus branch which was negative and I ended up stenting his proximal PDA with a 2.5 x 12 mm long Medtronic Onyx frontier drug-eluting stent deployed at 2.7 mm.  He was discharged home the following day.  His symptoms have completely resolved.  Since I saw him 5 months ago he has done well.  He apparently was intolerant to rosuvastatin and atorvastatin and was put back on pravastatin which she is tolerating.  He has had a few episodes that were brief of nitrate responsive angina but nothing like prior to his PDA stenting and feels clinically improved.   Current Meds  Medication Sig   amLODipine (NORVASC) 10 MG tablet Take 1 tablet by mouth once daily (Patient taking differently: Take 10 mg by mouth daily.)   aspirin EC 81 MG EC tablet Take 1 tablet (81 mg total) by mouth daily. Swallow whole.   Choline Fenofibrate (FENOFIBRIC ACID) 45 MG CPDR Take 1 capsule by mouth once daily (Patient taking differently: Take 45 mg by mouth daily.)  clobetasol ointment (TEMOVATE) 0.05 % Apply 1 Application topically 2 (two) times daily as needed (hives).   clopidogrel (PLAVIX) 75 MG tablet TAKE ONE TABLET BY MOUTH ONCE DAILY (Patient taking differently: Take 75 mg by mouth daily.)   clotrimazole-betamethasone (LOTRISONE) cream Apply 1 Application topically 2 (two) times daily as needed (skin irritation.).   Cyanocobalamin (VITAMIN B-12 PO) Take 1 tablet by mouth daily.    irbesartan-hydrochlorothiazide (AVALIDE) 300-12.5 MG tablet Take 1 tablet by mouth once daily (Patient taking differently: Take 1 tablet by mouth daily.)   ketoconazole (NIZORAL) 2 % shampoo Apply 1 application topically daily as needed (scalp irritation.).   metoprolol tartrate (LOPRESSOR) 25 MG tablet Take 1 tablet by mouth once daily   nitroGLYCERIN (NITROSTAT) 0.4 MG SL tablet Place 1 tablet (0.4 mg total) under the tongue every 5 (five) minutes as needed for chest pain.   nystatin cream (MYCOSTATIN) Apply 1 application topically daily as needed (skin irritation.).   Omega-3 Fatty Acids (FISH OIL) 1000 MG CAPS Take 1,000 mg by mouth every other day. In the evening, alternating with fenofibrate acid   pravastatin (PRAVACHOL) 40 MG tablet Take 1 tablet by mouth daily.   spironolactone (ALDACTONE) 25 MG tablet Take 1 tablet by mouth once daily   triamcinolone cream (KENALOG) 0.1 % Apply 1 application topically daily as needed (skin irritation.).     Allergies  Allergen Reactions   Adhesive [Tape] Other (See Comments)    Mainly just redness.   Has been ruled out for latex allegery   Hydrochlorothiazide Other (See Comments)    H/o Pancreatitis, takes in Avalide at home   Latex Other (See Comments)    unknown   Vantin [Cefpodoxime] Rash    Social History   Socioeconomic History   Marital status: Married    Spouse name: Not on file   Number of children: Not on file   Years of education: Not on file   Highest education level: Not on file  Occupational History   Not on file  Tobacco Use   Smoking status: Never   Smokeless tobacco: Never  Substance and Sexual Activity   Alcohol use: No   Drug use: No   Sexual activity: Not on file  Other Topics Concern   Not on file  Social History Narrative   Not on file   Social Determinants of Health   Financial Resource Strain: Not on file  Food Insecurity: Not on file  Transportation Needs: Not on file  Physical Activity: Not on file   Stress: Not on file  Social Connections: Not on file  Intimate Partner Violence: Not on file     Review of Systems: General: negative for chills, fever, night sweats or weight changes.  Cardiovascular: negative for chest pain, dyspnea on exertion, edema, orthopnea, palpitations, paroxysmal nocturnal dyspnea or shortness of breath Dermatological: negative for rash Respiratory: negative for cough or wheezing Urologic: negative for hematuria Abdominal: negative for nausea, vomiting, diarrhea, bright red blood per rectum, melena, or hematemesis Neurologic: negative for visual changes, syncope, or dizziness All other systems reviewed and are otherwise negative except as noted above.    Blood pressure 128/82, pulse (!) 47, height 5\' 11"  (1.803 m), weight 207 lb (93.9 kg), SpO2 95 %.  General appearance: alert and no distress Neck: no adenopathy, no carotid bruit, no JVD, supple, symmetrical, trachea midline, and thyroid not enlarged, symmetric, no tenderness/mass/nodules Lungs: clear to auscultation bilaterally Heart: regular rate and rhythm, S1, S2 normal, no murmur, click, rub or  gallop Extremities: extremities normal, atraumatic, no cyanosis or edema Pulses: 2+ and symmetric Skin: Skin color, texture, turgor normal. No rashes or lesions Neurologic: Grossly normal  EKG sinus bradycardia 47 with nonspecific ST and T wave changes.  Personally reviewed this EKG.  ASSESSMENT AND PLAN:   Essential hypertension History of essential hypertension a blood pressure measured today at 128/82.  He is on amlodipine, Avalide and metoprolol as well as Aldactone.  Hyperlipidemia History of hyperlipidemia on rosuvastatin and atorvastatin in the past which he was intolerant to.  His last lipid profile performed 3 months ago revealed total cholesterol 104, LDL of 54 and HDL 32.  He has since been changed to pravastatin which she is tolerating.  We will recheck a lipid liver profile this  morning.  Coronary artery disease History of CAD status post LAD stenting by myself with a bare-metal stent 04/16/1996.  He had multiple procedures since that time including catheterization 06/17/2009 revealing a patent proximal LAD stent with 90% stenosis just beyond this which I restented using a MultiLink bare-metal stent with an excellent result.  I performed angiography on him 05/02/2021 revealed a patent proximal LAD stent with 70% stenosis just proximal in his mid LAD that was physiologically significant by FFR which I restented using a 2.5 x 12 mm long Medtronic frontier drug-eluting stent postdilated with 2.75 mm balloon (2.8 mm).  He did have a 60% tubular proximal PDA stenosis after the RCA which was negative by DFR (0.94).  Because of progressive nitrate responsive angina I recast him 09/19/2021.  I performed DFR of his ramus branch which was negative and ended up stenting his PDA with a 2.5 x 12 mm long Medtronic Onyx frontier drug-eluting stent deployed at 2.7 mm.  His anginal essentially resolved since that time.     Jeffrey Gess MD FACP,FACC,FAHA, [facility] 04/18/2022 12:32 PM

## 2022-04-18 NOTE — Assessment & Plan Note (Signed)
History of CAD status post LAD stenting by myself with a bare-metal stent 04/16/1996.  He had multiple procedures since that time including catheterization 06/17/2009 revealing a patent proximal LAD stent with 90% stenosis just beyond this which I restented using a MultiLink bare-metal stent with an excellent result.  I performed angiography on him 05/02/2021 revealed a patent proximal LAD stent with 70% stenosis just proximal in his mid LAD that was physiologically significant by FFR which I restented using a 2.5 x 12 mm long Medtronic frontier drug-eluting stent postdilated with 2.75 mm balloon (2.8 mm).  He did have a 60% tubular proximal PDA stenosis after the RCA which was negative by DFR (0.94).  Because of progressive nitrate responsive angina I recast him 09/19/2021.  I performed DFR of his ramus branch which was negative and ended up stenting his PDA with a 2.5 x 12 mm long Medtronic Onyx frontier drug-eluting stent deployed at 2.7 mm.  His anginal essentially resolved since that time.

## 2022-04-18 NOTE — Assessment & Plan Note (Signed)
History of hyperlipidemia on rosuvastatin and atorvastatin in the past which he was intolerant to.  His last lipid profile performed 3 months ago revealed total cholesterol 104, LDL of 54 and HDL 32.  He has since been changed to pravastatin which she is tolerating.  We will recheck a lipid liver profile this morning.

## 2022-04-19 LAB — HEPATIC FUNCTION PANEL
ALT: 14 IU/L (ref 0–44)
AST: 20 IU/L (ref 0–40)
Albumin: 4.5 g/dL (ref 3.8–4.8)
Alkaline Phosphatase: 59 IU/L (ref 44–121)
Bilirubin Total: 1.2 mg/dL (ref 0.0–1.2)
Bilirubin, Direct: 0.29 mg/dL (ref 0.00–0.40)
Total Protein: 6.7 g/dL (ref 6.0–8.5)

## 2022-04-19 LAB — LIPID PANEL
Chol/HDL Ratio: 4 ratio (ref 0.0–5.0)
Cholesterol, Total: 119 mg/dL (ref 100–199)
HDL: 30 mg/dL — ABNORMAL LOW (ref >39)
LDL Chol Calc (NIH): 61 mg/dL (ref 0–99)
Triglycerides: 160 mg/dL — ABNORMAL HIGH (ref 0–149)
VLDL Cholesterol Cal: 28 mg/dL (ref 5–40)

## 2022-04-20 ENCOUNTER — Encounter: Payer: Self-pay | Admitting: *Deleted

## 2022-05-24 ENCOUNTER — Other Ambulatory Visit: Payer: Self-pay | Admitting: Cardiovascular Disease

## 2022-09-29 ENCOUNTER — Other Ambulatory Visit: Payer: Self-pay | Admitting: Cardiovascular Disease

## 2022-10-17 ENCOUNTER — Ambulatory Visit: Payer: Medicare PPO | Admitting: Cardiovascular Disease

## 2022-11-02 ENCOUNTER — Other Ambulatory Visit: Payer: Self-pay | Admitting: Cardiovascular Disease

## 2022-11-14 ENCOUNTER — Ambulatory Visit: Payer: Medicare PPO | Attending: Cardiovascular Disease | Admitting: Cardiovascular Disease

## 2022-11-14 ENCOUNTER — Encounter: Payer: Self-pay | Admitting: Cardiovascular Disease

## 2022-11-14 VITALS — BP 172/88 | HR 50 | Ht 71.0 in | Wt 200.4 lb

## 2022-11-14 DIAGNOSIS — R002 Palpitations: Secondary | ICD-10-CM

## 2022-11-14 DIAGNOSIS — E782 Mixed hyperlipidemia: Secondary | ICD-10-CM | POA: Diagnosis not present

## 2022-11-14 DIAGNOSIS — I2510 Atherosclerotic heart disease of native coronary artery without angina pectoris: Secondary | ICD-10-CM | POA: Diagnosis not present

## 2022-11-14 DIAGNOSIS — I2583 Coronary atherosclerosis due to lipid rich plaque: Secondary | ICD-10-CM | POA: Diagnosis not present

## 2022-11-14 DIAGNOSIS — I10 Essential (primary) hypertension: Secondary | ICD-10-CM

## 2022-11-14 MED ORDER — AMLODIPINE BESYLATE 5 MG PO TABS: 5.0000 mg | ORAL_TABLET | Freq: Every day | ORAL | 3 refills | Status: DC

## 2022-11-14 MED ORDER — NITROGLYCERIN 0.4 MG SL SUBL: 0.4000 mg | SUBLINGUAL_TABLET | SUBLINGUAL | 3 refills | Status: DC | PRN

## 2022-11-14 NOTE — Progress Notes (Signed)
11/14/2022 Jeffrey Acevedo   [birth year]  [id number]  Primary Physician [name], MD Primary Cardiologist: Runell Gess MD FACP, [place], [place], [place]  HPI:  Jeffrey Acevedo is a 82 y.o.   overweight married Caucasian male, father of 2 and grandfather of 4 grandchildren, whom I last saw in the office 04/18/2022.Jeffrey Acevedo   He has a history of CAD, status post LAD stenting by me using bare-metal stent April 16, 1996. He had normal LV function at that time with normal renal arteries as well. His other problems include essential hypertension, hyperlipidemia. He had a normal Myoview February 2011 and was scheduled to have elective robotic prostatectomy for prostate cancer by Dr. Crecencio Mc. He developed exertional chest pain similar to his pre-stent symptoms, which were fairly reproducible and because of this, and despite a negative Myoview, I elected to recatheterize him June 17, 2009, revealing a patent proximal LAD stent with 90% stenosis just beyond this, which I then stented with a Multi-Link Vision bare-metal stent with an excellent result. Since I saw him a year ago he's been completely asymptomatic. He did have a Myoview stress test performed 03/31/15 which was entirely normal.. This was performed for preoperative clearance before a right total hip replacement on 04/19/15 which was uneventful.   Because of symptoms of dyspnea and palpitations, I did get a 2D echo which was unremarkable, Myoview stress test which was nonischemic and a event monitor that showed frequent PVCs and short runs of nonsustained ventricular tachycardia and SVT.  He clearly is symptomatic.  Because his last stress test in 2011 was falsely negative and cath subsequently revealed a 90% LAD lesion we decided jointly to proceed with outpatient diagnostic coronary angiography  I performed outpatient diagnostic coronary angiography via the right radial approach on 05/02/2021 revealing patent proximal and mid LAD stents with a 70%  proximal to mid LAD stenosis that was physiologically significant by FFR.  I placed a 2.5 mm x 12 mm long Medtronic frontier drug-eluting stent postdilated with a 2.75 mm balloon (2.8 mm).  He also had a 60% tubular proximal PDA stenosis off of the RCA which was negative by DFR (0.94).  He was discharged home the following day.  He did develop COVID that day which has taken 2 weeks to resolve his symptoms of shortness of breath.   Because of continued nitrate responsive chest pain I decided to cath him on 09/19/2021.  I performed DFR of the ramus branch which was negative and I ended up stenting his proximal PDA with a 2.5 x 12 mm long Medtronic Onyx frontier drug-eluting stent deployed at 2.7 mm.  He was discharged home the following day.  His symptoms have completely resolved.   Since I saw him 8 months ago he continues to do well.  He apparently now is intolerant to pravastatin.  Fortunately, he denies chest pain or shortness of breath.  He has noticed some left lower extremity extremity swelling which he attributes to his amlodipine.   Current Meds  Medication Sig   amLODipine (NORVASC) 10 MG tablet Take 1 tablet by mouth once daily (Patient taking differently: Take 10 mg by mouth daily.)   aspirin EC 81 MG EC tablet Take 1 tablet (81 mg total) by mouth daily. Swallow whole.   Choline Fenofibrate (FENOFIBRIC ACID) 45 MG CPDR Take 1 capsule by mouth once daily   clobetasol ointment (TEMOVATE) 0.05 % Apply 1 Application topically 2 (two) times daily as needed (hives).  clopidogrel (PLAVIX) 75 MG tablet TAKE ONE TABLET BY MOUTH ONCE DAILY (Patient taking differently: Take 75 mg by mouth daily.)   clotrimazole-betamethasone (LOTRISONE) cream Apply 1 Application topically 2 (two) times daily as needed (skin irritation.).   Cyanocobalamin (VITAMIN B-12 PO) Take 1 tablet by mouth daily.   irbesartan-hydrochlorothiazide (AVALIDE) 300-12.5 MG tablet Take 1 tablet by mouth once daily   ketoconazole  (NIZORAL) 2 % shampoo Apply 1 application topically daily as needed (scalp irritation.).   metoprolol tartrate (LOPRESSOR) 25 MG tablet Take 1 tablet by mouth once daily   nitroGLYCERIN (NITROSTAT) 0.4 MG SL tablet Place 1 tablet (0.4 mg total) under the tongue every 5 (five) minutes as needed for chest pain.   nystatin cream (MYCOSTATIN) Apply 1 application topically daily as needed (skin irritation.).   Omega-3 Fatty Acids (FISH OIL) 1000 MG CAPS Take 1,000 mg by mouth every other day. In the evening, alternating with fenofibrate acid   pravastatin (PRAVACHOL) 40 MG tablet Take 1 tablet by mouth daily.   spironolactone (ALDACTONE) 25 MG tablet Take 1 tablet by mouth once daily     Allergies  Allergen Reactions   Adhesive [Tape] Other (See Comments)    Mainly just redness.   Has been ruled out for latex allegery   Hydrochlorothiazide Other (See Comments)    H/o Pancreatitis, takes in Avalide at home   Latex Other (See Comments)    unknown   Vantin [Cefpodoxime] Rash    Social History   Socioeconomic History   Marital status: Married    Spouse name: Not on file   Number of children: Not on file   Years of education: Not on file   Highest education level: Not on file  Occupational History   Not on file  Tobacco Use   Smoking status: Never   Smokeless tobacco: Never  Substance and Sexual Activity   Alcohol use: No   Drug use: No   Sexual activity: Not on file  Other Topics Concern   Not on file  Social History Narrative   Not on file   Social Determinants of Health   Financial Resource Strain: Not on file  Food Insecurity: Not on file  Transportation Needs: Not on file  Physical Activity: Not on file  Stress: Not on file  Social Connections: Not on file  Intimate Partner Violence: Not on file     Review of Systems: General: negative for chills, fever, night sweats or weight changes.  Cardiovascular: negative for chest pain, dyspnea on exertion, edema, orthopnea,  palpitations, paroxysmal nocturnal dyspnea or shortness of breath Dermatological: negative for rash Respiratory: negative for cough or wheezing Urologic: negative for hematuria Abdominal: negative for nausea, vomiting, diarrhea, bright red blood per rectum, melena, or hematemesis Neurologic: negative for visual changes, syncope, or dizziness All other systems reviewed and are otherwise negative except as noted above.    Blood pressure (!) 172/88, pulse (!) 50, height 5\' 11"  (1.803 m), weight 200 lb 6.4 oz (90.9 kg), SpO2 92%.  General appearance: alert and no distress Neck: no adenopathy, no carotid bruit, no JVD, supple, symmetrical, trachea midline, and thyroid not enlarged, symmetric, no tenderness/mass/nodules Lungs: clear to auscultation bilaterally Heart: regular rate and rhythm, S1, S2 normal, no murmur, click, rub or gallop Extremities: extremities normal, atraumatic, no cyanosis or edema Pulses: 2+ and symmetric Skin: Skin color, texture, turgor normal. No rashes or lesions Neurologic: Grossly normal  EKG EKG Interpretation Date/Time:  Tuesday November 14 2022 15:21:24 EDT Ventricular Rate:  50 PR Interval:  230 QRS Duration:  110 QT Interval:  468 QTC Calculation: 426 R Axis:   -7  Text Interpretation: Sinus bradycardia with 1st degree A-V block Minimal voltage criteria for LVH, may be normal variant ( Cornell product ) When compared with ECG of 20-Sep-2021 04:46, No significant change was found Confirmed by [name] [phone]) on 11/14/2022 3:23:38 PM    ASSESSMENT AND PLAN:   Essential hypertension History of essential hypertension blood pressure measured today 172/88.  He is on amlodipine and Avalide.  He says he is under a lot of stress at home because of his wife upcoming surgery.  He also complains of some asymmetric lower extremity edema which he attributes to his amlodipine.  I am going to decrease his amlodipine from 10 to 5 mg a day and have him keep track of  his blood pressure at home.  He will see an APP back in 3 months to further evaluate.  Hyperlipidemia History of hyperlipidemia on rosuvastatin, atorvastatin and male pravastatin which she does not tolerate.  His most recent lipid profile performed 04/18/2022 revealed total cholesterol 119, LDL 61 HDL 30.  I believe he be a good candidate for a PCSK9 which we will explore.  Coronary artery disease History of CAD status post LAD stenting by myself 04/16/1996.  He has had multiple cardiac catheterizations since that time most recently 05/02/2021 revealing patent proximal and mid LAD stents with a 70% stenosis between the 2 stents which was physiologically significant.  I placed a 2.5 x 12 mm long Medtronic frontier drug-eluting stent and postdilated with a 2.75 balloon (2.8 mm.  He also had a 60% tubular PDA lesion of of the RCA which was negative by DFR (0.94).  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  I decided to recath him 09/19/2021.  I performed DFR of the ramus branch which was negative and stented his proximal PDA with a 2.5 x 12 mm long Medtronic Onyx frontier drug-eluting stent postdilated 2.7 mm.  He was discharged home the following day.  He has had no recurrent angina.     Runell Gess MD FACP,FACC,FAHA, [facility] 11/14/2022 3:45 PM

## 2022-11-14 NOTE — Assessment & Plan Note (Signed)
History of CAD status post LAD stenting by myself 04/16/1996.  He has had multiple cardiac catheterizations since that time most recently 05/02/2021 revealing patent proximal and mid LAD stents with a 70% stenosis between the 2 stents which was physiologically significant.  I placed a 2.5 x 12 mm long Medtronic frontier drug-eluting stent and postdilated with a 2.75 balloon (2.8 mm.  He also had a 60% tubular PDA lesion of of the RCA which was negative by DFR (0.94).  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  I decided to recath him 09/19/2021.  I performed DFR of the ramus branch which was negative and stented his proximal PDA with a 2.5 x 12 mm long Medtronic Onyx frontier drug-eluting stent postdilated 2.7 mm.  He was discharged home the following day.  He has had no recurrent angina.

## 2022-11-14 NOTE — Patient Instructions (Signed)
Medication Instructions:  Your physician has recommended you make the following change in your medication:   -Decrease amlodipine (norvasc) to 5mg  once daily.  -Stop pravastatin (Pravachol).  *If you need a refill on your cardiac medications before your next appointment, please call your pharmacy*   Lab Work: Your physician recommends that you return for lab work in: next day or 2 for FASTING lipid/liver   [place] - [address]   If you have labs (blood work) drawn today and your tests are completely normal, you will receive your results only by: MyChart Message (if you have MyChart) OR A paper copy in the mail If you have any lab test that is abnormal or we need to change your treatment, we will call you to review the results.   Follow-Up: At [facility], you and your health needs are our priority.  As part of our continuing mission to provide you with exceptional heart care, we have created designated Provider Care Teams.  These Care Teams include your primary Cardiologist (physician) and Advanced Practice Providers (APPs -  Physician Assistants and Nurse Practitioners) who all work together to provide you with the care you need, when you need it.  We recommend signing up for the patient portal called "MyChart".  Sign up information is provided on this After Visit Summary.  MyChart is used to connect with patients for Virtual Visits (Telemedicine).  Patients are able to view lab/test results, encounter notes, upcoming appointments, etc.  Non-urgent messages can be sent to your provider as well.   To learn more about what you can do with MyChart, go to [email or web address].    Your next appointment:   3 month(s)  Provider:   Marjie Skiff, PA-C, Robet Leu, PA-C, Juanda Crumble, PA-C, Azalee Course, PA-C, or Bernadene Person, NP       Then, [name], MD will plan to see you again in 6 month(s).

## 2022-11-14 NOTE — Assessment & Plan Note (Signed)
History of hyperlipidemia on rosuvastatin, atorvastatin and male pravastatin which she does not tolerate.  His most recent lipid profile performed 04/18/2022 revealed total cholesterol 119, LDL 61 HDL 30.  I believe he be a good candidate for a PCSK9 which we will explore.

## 2022-11-14 NOTE — Assessment & Plan Note (Signed)
History of essential hypertension blood pressure measured today 172/88.  He is on amlodipine and Avalide.  He says he is under a lot of stress at home because of his wife upcoming surgery.  He also complains of some asymmetric lower extremity edema which he attributes to his amlodipine.  I am going to decrease his amlodipine from 10 to 5 mg a day and have him keep track of his blood pressure at home.  He will see an APP back in 3 months to further evaluate.

## 2022-11-16 LAB — HEPATIC FUNCTION PANEL
ALT: 12 IU/L (ref 0–44)
AST: 18 IU/L (ref 0–40)
Albumin: 4.2 g/dL (ref 3.7–4.7)
Alkaline Phosphatase: 62 IU/L (ref 44–121)
Bilirubin Total: 1.2 mg/dL (ref 0.0–1.2)
Bilirubin, Direct: 0.33 mg/dL (ref 0.00–0.40)
Total Protein: 6.6 g/dL (ref 6.0–8.5)

## 2022-11-16 LAB — LIPID PANEL
Chol/HDL Ratio: 4.3 ratio (ref 0.0–5.0)
Cholesterol, Total: 103 mg/dL (ref 100–199)
HDL: 24 mg/dL — ABNORMAL LOW (ref >39)
LDL Chol Calc (NIH): 59 mg/dL (ref 0–99)
Triglycerides: 103 mg/dL (ref 0–149)
VLDL Cholesterol Cal: 20 mg/dL (ref 5–40)

## 2022-12-06 ENCOUNTER — Telehealth: Payer: Self-pay | Admitting: Cardiovascular Disease

## 2022-12-06 NOTE — Telephone Encounter (Signed)
Patient Son in law states he is with patient. They were at [facility] with his wife and they have called EMS to take him to the ED.  Advised to call once released so we can make any follow up as needed.

## 2022-12-06 NOTE — Telephone Encounter (Signed)
   Pt c/o of Chest Pain: STAT if active CP, including tightness, pressure, jaw pain, radiating pain to shoulder/upper arm/back, CP unrelieved by Nitro. Symptoms reported of SOB, nausea, vomiting, sweating.  1. Are you having CP right now? No     2. Are you experiencing any other symptoms (ex. SOB, nausea, vomiting, sweating)? Patient feels weak, can't walk but 20 feet and needs to catch his breath. Patient states when he walk he can feel heart pounding.    3. Is your CP continuous or coming and going? The pain is continuous until his takes his nitroglycerin.     4. Have you taken Nitroglycerin? Yes.  He took one this morning and two last night.  Pain is relieved when he takes his nitro.   5. How long have you been experiencing CP? Have been having pains for about a week, but today it's more pronounce.     6. If NO CP at time of call then end call with telling Pt to call back or call 911 if Chest pain returns prior to return call from triage team.

## 2022-12-08 NOTE — Telephone Encounter (Signed)
Called to Daughter [name].  Advised that we could not speak to the daughter Stanton Kidney.  Advised that they are asking for the "next steps". Advised her that he has care by providers in hospital and they will make any decisions on his care while admitted.  Once he is discharged , then he will need a follow up appt and can discuss ongoing care with [place] at that time. She will pass this information on.

## 2022-12-08 NOTE — Telephone Encounter (Signed)
Daughter Stanton Kidney) stated patient had fallen last night, is unsteady on his feet, his oxygen sats are low and is in hospital in [facility].  Daughter wants a call back to follow-up on next steps.

## 2022-12-12 NOTE — Progress Notes (Signed)
Cardiology Office Note:    Date:  12/13/2022   ID:  Jeffrey Acevedo, DOB 1940/10/18, MRN [id number]  PCP:  [name], MD   [facility] Providers Cardiologist:  [name], MD     Referring MD: [name], MD   Chief Complaint  Patient presents with   Shortness of Breath    Pt says still feels sob even with oxygen on while be active   Follow-up   Hospitalization Follow-up    Respiratory failure    History of Present Illness:    Jeffrey Acevedo is a 82 y.o. male with a hx of CAD s/p BMS-LAD in 1998, BMS-LAD 2011, DES-LAD 2023, DES-PDA 2023, hypertension, hyperlipidemia, and CKD 3a.   Nuclear stress test 2011 was nonischemic, this was prior robotic prostatectomy for prostate cancer.  Heart catheterization for continued symptoms 05/2009 with patent proximal LAD stent but 90% stenosis beyond the stent successfully treated with BMS.  Nuclear stress test 2017 was nonischemic, this was for preoperative risk evaluation prior to total hip replacement in 2017. Due to symptoms concerning for angina and hx of false negative stress test in 2011, heart cath was performed 05/2021.  He had patent LAD stents but a 70% proximal to mid LAD stenosis significant by FFR treated with a 2.5 x 12 mm DES.  A 60% tubular proximal PDA stenosis was negative by DFR and treated medically.  Unfortunately he continued to have nitrate responsive chest pain and underwent repeat heart catheterization 09/19/2021 with negative DFR of the ramus branch, proximal PDA was treated with a 2.5 x 12 mm long DES.  He was last seen by Dr. Allyson Sabal 11/14/2022 and was doing well at that time.  He felt he had some lower extremity swelling due to amlodipine.  Unfortunately he was admitted 12/06/2022 with acute hypoxic respiratory failure at [facility].  He underwent echocardiogram which showed preserved LVEF 60-65%, DD, normal RV, and no significant valvular disease. Nuclear stress test showed Normal perfusion.   He was hypoxic requiring 3L [place].   Started on 30 mg Imdur, continued on irbesartan-HCTZ 300-12.5 mg, 20 mg Lasix daily, 12.5 mg metoprolol twice daily, 25 mg spironolactone was held.   He presents today for hospital follow up. He states he was taking NTG SL tablets the night before and on the way to the hospitalization. Prior to his hospitalization, he was having more trouble walking / keeping up. His daughter confirms he has been having exertional chest pain for about 2 months prior to hospitalization. He has only had 2 days of imdur, but thinks this has been helping - has not had exertional chest pain since discharge 2 days ago.   Mg at discharge was 1.8, was supplemented. Not walking much at home, now on a walker, struggling to transition sitting to walking. PT at home starts today.    Past Medical History:  Diagnosis Date   Cancer [facility])    prostate cancer surgery 2011   Coronary artery disease    sees Dr. Erlene Quan 3 mths ago.  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   Hyperlipidemia    Hypertension     Past Surgical History:  Procedure Laterality Date   CARDIAC CATHETERIZATION  06/17/2009   Proximal LAD 90% stenosis just beyond previously stented segment with a Multi-[employer] Vision bare-metal  stent   CORONARY ANGIOPLASTY     stents placed in 1998 and 2011    CORONARY PRESSURE/FFR STUDY N/A 05/02/2021   Procedure: INTRAVASCULAR PRESSURE WIRE/FFR STUDY;  Surgeon: Runell Gess, MD;  Location: [facility] INVASIVE CV LAB;  Service: Cardiovascular;  Laterality: N/A;   CORONARY PRESSURE/FFR STUDY N/A 09/19/2021   Procedure: INTRAVASCULAR PRESSURE WIRE/FFR STUDY;  Surgeon: Runell Gess, MD;  Location: [facility] INVASIVE CV LAB;  Service: Cardiovascular;  Laterality: N/A;  RAMUS   CORONARY STENT INTERVENTION N/A 05/02/2021   Procedure: CORONARY STENT INTERVENTION;  Surgeon: Runell Gess,  MD;  Location: [facility] INV[facility]IVE CV [facility];  Service: Cardiovascular;  Laterality: N/A;   CORONARY STENT INTERVENTION N/A 09/19/2021   Procedure: CORONARY STENT INTERVENTION;  Surgeon: Runell Gess, MD;  Location: [facility];  Service: Cardiovascular;  Laterality: N/A;   LEFT HEART CATH AND CORONARY ANGIOGRAPHY N/A 05/02/2021   Procedure: LEFT HEART CATH AND CORONARY ANGIOGRAPHY;  Surgeon: Runell Gess, MD;  Location: [facility];  Service: Cardiovascular;  Laterality: N/A;   LEFT HEART CATH AND CORONARY ANGIOGRAPHY N/A 09/19/2021   Procedure: LEFT HEART CATH AND CORONARY ANGIOGRAPHY;  Surgeon: Runell Gess, MD;  Location: [facility];  Service: Cardiovascular;  Laterality: N/A;   NASAL SEPTUM SURGERY     Dr. Richardson Landry @ [facility]  2014   NM MYOVIEW LTD  2011   ROBOT ASSISTED LAPAROSCOPIC RADICAL PROSTATECTOMY     TOTAL HIP ARTHROPLASTY Right 04/19/2015   Procedure: RIGHT TOTAL HIP ARTHROPLASTY ANTERIOR APPROACH;  Surgeon: Jodi Geralds, MD;  Location: [facility] OR;  Service: Orthopedics;  Laterality: Right;    Current Medications: Current Meds  Medication Sig   amLODipine (NORVASC) 5 MG tablet Take 1 tablet (5 mg total) by mouth daily.   aspirin EC 81 MG EC tablet Take 1 tablet (81 mg total) by mouth daily. Swallow whole.   clopidogrel (PLAVIX) 75 MG tablet TAKE ONE TABLET BY MOUTH ONCE DAILY (Patient taking differently: Take 75 mg by mouth daily.)   isosorbide mononitrate (IMDUR) 30 MG 24 hr tablet Take 1 tablet by mouth daily.   metoprolol tartrate (LOPRESSOR) 25 MG tablet Take 1 tablet by mouth once daily (Patient taking differently: Take 12.5 mg by mouth 2 (two) times daily. Pt is cutting 25 mg tab in half per hospital visit)   Omega-3 Fatty Acids (FISH OIL) 1000 MG CAPS Take 1,000 mg by mouth every other day. In the evening, alternating with fenofibrate acid   [DISCONTINUED] furosemide (LASIX) 20 MG tablet Take 1 tablet by mouth daily.     Allergies:   Adhesive  [tape], Hydrochlorothiazide, Latex, and Vantin [cefpodoxime]   Social History   Socioeconomic History   Marital status: Married    Spouse name: Not on file   Number of children: Not on file   Years of education: Not on file   Highest education level: Not on file  Occupational History   Not on file  Tobacco Use   Smoking status: Never   Smokeless tobacco: Never  Substance and Sexual Activity   Alcohol use: No   Drug use: No   Sexual activity: Not on file  Other Topics Concern   Not on file  Social History Narrative   Not on file   Social Determinants of Health   Financial Resource Strain: Not on file  Food Insecurity: Low Risk  (12/12/2022)   Received from [facility]   Hunger Vital Sign  Worried About Programme researcher, broadcasting/film/video in the Last Year: Never true    Ran Out of Food in the Last Year: Never true  Transportation Needs: Unmet Transportation Needs (12/12/2022)   Received from [employer]    In the past 12 months, has lack of reliable transportation kept you from medical appointments, meetings, work or from getting things needed for daily living? : Yes  Physical Activity: Not on file  Stress: Not on file  Social Connections: Not on file     Family History: The patient's family history includes Heart disease in his father; Hypertension in his father and mother.  ROS:   Please see the history of present illness.     All other systems reviewed and are negative.  EKGs/Labs/Other Studies Reviewed:    The following studies were reviewed today:  Echo [facility]  EKG Interpretation Date/Time:  Wednesday December 13 2022 10:14:59 EDT Ventricular Rate:  55 PR Interval:  222 QRS Duration:  102 QT Interval:  444 QTC Calculation: 424 R Axis:   -36  Text Interpretation: Sinus bradycardia with 1st degree A-V block Left axis deviation Minimal voltage criteria for LVH, may be normal variant ( Cornell product ) Septal infarct , age undetermined When  compared with ECG of 14-Nov-2022 15:21, No significant change was found Confirmed by Micah Flesher ([id number]) on 12/13/2022 10:17:33 AM    Recent Labs: 11/15/2022: ALT 12  Recent Lipid Panel    Component Value Date/Time   CHOL 103 11/15/2022 0837   TRIG 103 11/15/2022 0837   HDL 24 (L) 11/15/2022 0837   CHOLHDL 4.3 11/15/2022 0837   CHOLHDL 3.8 05/03/2021 0508   VLDL 34 05/03/2021 0508   LDLCALC 59 11/15/2022 0837     Risk Assessment/Calculations:                Physical Exam:    VS:  BP 136/60 (BP Location: Left Arm, Patient Position: Sitting, Cuff Size: Normal)   Pulse 83   Ht 5\' 11"  (1.803 m)   Wt 200 lb 9.6 oz (91 kg)   SpO2 93%   BMI 27.98 kg/m     Wt Readings from Last 3 Encounters:  12/13/22 200 lb 9.6 oz (91 kg)  11/14/22 200 lb 6.4 oz (90.9 kg)  04/18/22 207 lb (93.9 kg)     GEN:  Well nourished, well developed in no acute distress HEENT: Normal NECK: No JVD; No carotid bruits LYMPHATICS: No lymphadenopathy CARDIAC: RRR, no murmurs, rubs, gallops RESPIRATORY:  Clear to auscultation without rales, wheezing or rhonchi, [place] in place ABDOMEN: Soft, non-tender, non-distended MUSCULOSKELETAL:  R > L LE edema; No deformity  SKIN: Warm and dry NEUROLOGIC:  Alert and oriented x 3 PSYCHIATRIC:  Normal affect   ASSESSMENT:    1. Palpitations   2. Coronary artery disease involving native coronary artery of native heart without angina pectoris   3. Dyspnea on exertion   4. Essential hypertension   5. Coronary artery disease due to lipid rich plaque   6. CAD S/P percutaneous coronary angioplasty   7. Hyperlipidemia with target LDL less than 70   8. Chronic respiratory failure, unspecified whether with hypoxia or hypercapnia (HCC)    PLAN:    In order of problems listed above:  Chronic respiratory failure - new oxygen requirement - discharged with O2 and plans for follow up with pulmonology - will submit this referral - increase lasix to 40 mg  daily   Hypertension - now on  imdur 30 mg - holding irbesartan-hydrochlorothiazide and spironolactone - metoprolol changed to 12.5 mg BID - discharged with 20 mg lasix   Hyperlipidemia with LDL goal less than 70 Has not tolerated rosuvastatin, atorvastatin, and pravastatin 11/15/2022: Cholesterol, Total 103; HDL 24; LDL Chol Calc (NIH) 59; Triglycerides 103 -Fenofibrate, OTC omega-3   CAD s/p PCI-LAD, PCI-PDA  -CAD dating back to 2011 -Most recent intervention to LAD in 2023 -Remains on DAPT - reassuring nuclear stress test 11/2022, although history of false negative   CKD III - sCr 1.46 - stable at baseline with K 4.4 - Mg was 1.8 - draw labs today, increase lasix to 40 mg daily   Possible depression - I provided them with an EKG from today should PCP want to discuss SSRI   Penile edema - refer to [facility], elevation, increase lasix   I suspect he is having exertional angina and this contributed to his flash pulmonary edema and new O2 requirement. EKG today is stable compared to prior. I will continue holding spiro until Mg has stabilized. I will continue holding ARB and hydrochlorothiazide for now. I will increase lasix to 40 mg daily, add compression socks.   I think he will likely need a repeat heart catheterization, but will need some time to recover from his hospitalization and PT to regain strength and mobility. He will have close follow up with daily weights.            Medication Adjustments/Labs and Tests Ordered: Current medicines are reviewed at length with the patient today.  Concerns regarding medicines are outlined above.  Orders Placed This Encounter  Procedures   Basic metabolic panel   Brain natriuretic peptide   Magnesium   Ambulatory referral to Pulmonology   Ambulatory referral to Urology   EKG 12-Lead   Meds ordered this encounter  Medications   furosemide (LASIX) 40 MG tablet    Sig: Take 1 tablet (40 mg total) by mouth daily.     Dispense:  30 tablet    Refill:  2    Patient Instructions  Medication Instructions:  Your physician assistant has recommended you make the following change in your medication:   -Start magnesium citrate 400mg  once daily. (Over the counter)  -Increase furosemide (lasix) to 40mg  once daily.   *If you need a refill on your cardiac medications before your next appointment, please call your pharmacy*   Lab Work: Your physician recommends that you have labs drawn today: BMET, BNP, Mag   If you have labs (blood work) drawn today and your tests are completely normal, you will receive your results only by: MyChart Message (if you have MyChart) OR A paper copy in the mail If you have any lab test that is abnormal or we need to change your treatment, we will call you to review the results.   Follow-Up: At [facility], you and your health needs are our priority.  As part of our continuing mission to provide you with exceptional heart care, we have created designated Provider Care Teams.  These Care Teams include your primary Cardiologist (physician) and Advanced Practice Providers (APPs -  Physician Assistants and Nurse Practitioners) who all work together to provide you with the care you need, when you need it.  We recommend signing up for the patient portal called "MyChart".  Sign up information is provided on this After Visit Summary.  MyChart is used to connect with patients for Virtual Visits (Telemedicine).  Patients are able to view  lab/test results, encounter notes, upcoming appointments, etc.  Non-urgent messages can be sent to your provider as well.   To learn more about what you can do with MyChart, go to [email or web address].    Your next appointment:   2-3 week(s)  Provider:   [name], MD     Other Instructions Angie recommends wearing compression stockings to help with lower extremity swelling.  Angie would like for you to weigh yourself daily. You  should do this at the same time everyday at the same time with similar clothing or with no clothing. Please keep a log of your weights and bring to your next office visit.   [facility] [address] [place], [place] [address] [phone]    Signed, Roe Rutherford Saxon, [place]  12/13/2022 10:39 AM    [facility]

## 2022-12-13 ENCOUNTER — Encounter: Payer: Self-pay | Admitting: Physician Assistant

## 2022-12-13 ENCOUNTER — Telehealth: Payer: Self-pay | Admitting: Cardiovascular Disease

## 2022-12-13 ENCOUNTER — Ambulatory Visit: Payer: Medicare PPO | Attending: Physician Assistant | Admitting: Physician Assistant

## 2022-12-13 VITALS — BP 136/60 | HR 83 | Ht 71.0 in | Wt 200.6 lb

## 2022-12-13 DIAGNOSIS — R002 Palpitations: Secondary | ICD-10-CM | POA: Diagnosis not present

## 2022-12-13 DIAGNOSIS — J9610 Chronic respiratory failure, unspecified whether with hypoxia or hypercapnia: Secondary | ICD-10-CM | POA: Diagnosis not present

## 2022-12-13 DIAGNOSIS — I2510 Atherosclerotic heart disease of native coronary artery without angina pectoris: Secondary | ICD-10-CM | POA: Diagnosis not present

## 2022-12-13 DIAGNOSIS — R0609 Other forms of dyspnea: Secondary | ICD-10-CM

## 2022-12-13 DIAGNOSIS — I10 Essential (primary) hypertension: Secondary | ICD-10-CM

## 2022-12-13 DIAGNOSIS — E785 Hyperlipidemia, unspecified: Secondary | ICD-10-CM

## 2022-12-13 DIAGNOSIS — Z9861 Coronary angioplasty status: Secondary | ICD-10-CM

## 2022-12-13 DIAGNOSIS — I2583 Coronary atherosclerosis due to lipid rich plaque: Secondary | ICD-10-CM

## 2022-12-13 MED ORDER — FUROSEMIDE 40 MG PO TABS: 40.0000 mg | ORAL_TABLET | Freq: Every day | ORAL | 2 refills | Status: DC

## 2022-12-13 MED ORDER — MAGNESIUM CITRATE 200 MG PO TABS: 400.0000 mg | ORAL_TABLET | Freq: Every day | ORAL | Status: DC

## 2022-12-13 NOTE — Telephone Encounter (Signed)
Paper Work Dropped Off: POA  Date:12/13/2022  Location of paper: Sent to medical records

## 2022-12-13 NOTE — Patient Instructions (Addendum)
Medication Instructions:  Your physician assistant has recommended you make the following change in your medication:   -Start magnesium citrate 400mg  once daily. (Over the counter)  -Increase furosemide (lasix) to 40mg  once daily.   *If you need a refill on your cardiac medications before your next appointment, please call your pharmacy*   Lab Work: Your physician recommends that you have labs drawn today: BMET, BNP, Mag   If you have labs (blood work) drawn today and your tests are completely normal, you will receive your results only by: MyChart Message (if you have MyChart) OR A paper copy in the mail If you have any lab test that is abnormal or we need to change your treatment, we will call you to review the results.   Follow-Up: At [place] [facility], you and your health needs are our priority.  As part of our continuing mission to provide you with exceptional heart care, we have created designated Provider Care Teams.  These Care Teams include your primary Cardiologist (physician) and Advanced Practice Providers (APPs -  Physician Assistants and Nurse Practitioners) who all work together to provide you with the care you need, when you need it.  We recommend signing up for the patient portal called "MyChart".  Sign up information is provided on this After Visit Summary.  MyChart is used to connect with patients for Virtual Visits (Telemedicine).  Patients are able to view lab/test results, encounter notes, upcoming appointments, etc.  Non-urgent messages can be sent to your provider as well.   To learn more about what you can do with MyChart, go to [email or web address].    Your next appointment:   2-3 week(s)  Provider:   [name], MD     Other Instructions Angie recommends wearing compression stockings to help with lower extremity swelling.  Angie would like for you to weigh yourself daily. You should do this at the same time everyday at the same time with similar  clothing or with no clothing. Please keep a log of your weights and bring to your next office visit.   [facility] [address] [place], [place], [place] [address] [phone]

## 2022-12-13 NOTE — Addendum Note (Signed)
Addended by: Bernita Buffy on: 12/13/2022 02:36 PM   Modules accepted: Orders

## 2022-12-14 LAB — MAGNESIUM: Magnesium: 2.1 mg/dL (ref 1.6–2.3)

## 2022-12-14 LAB — BRAIN NATRIURETIC PEPTIDE: BNP: 78.8 pg/mL (ref 0.0–100.0)

## 2022-12-14 LAB — BASIC METABOLIC PANEL
BUN/Creatinine Ratio: 18 (ref 10–24)
BUN: 24 mg/dL (ref 8–27)
CO2: 25 mmol/L (ref 20–29)
Calcium: 9.8 mg/dL (ref 8.6–10.2)
Chloride: 98 mmol/L (ref 96–106)
Creatinine, Ser: 1.32 mg/dL — ABNORMAL HIGH (ref 0.76–1.27)
Glucose: 86 mg/dL (ref 70–99)
Potassium: 5 mmol/L (ref 3.5–5.2)
Sodium: 136 mmol/L (ref 134–144)
eGFR: 54 mL/min/{1.73_m2} — ABNORMAL LOW (ref >59)

## 2022-12-21 ENCOUNTER — Telehealth: Payer: Self-pay | Admitting: Cardiovascular Disease

## 2022-12-21 NOTE — Telephone Encounter (Signed)
Left voicemail to return call to office.

## 2022-12-21 NOTE — Telephone Encounter (Signed)
Nurse Care Manager called She states he was taking metoprolol 25 mg daily.  Advised he was seen on the 25 th and it was changed to 12.5 mg Twice a day. I verified this with her twice as she discussed what the hospital had said and another source had said.  She wanted to  discuss his issues with O2 and advised the doctor is awre he is in Chronic respiratory failure and he is following with pulmonary.  She wanted to discuss inhalers being prescribed and advised her to speak with pulmonary as again his DX.  She states understanding and will make note

## 2022-12-21 NOTE — Telephone Encounter (Signed)
Pt c/o medication issue:  1. Name of Medication:  metoprolol tartrate (LOPRESSOR) 25 MG tablet   2. How are you currently taking this medication (dosage and times per day)?   3. Are you having a reaction (difficulty breathing--STAT)?   4. What is your medication issue?   Amber, Investment banker, operational with [employer], would like to clarify medication instructions. She would mentions that the patient is barely able to maintain oxygen at 90% and he has to focus on his breathing to get to 90. She would like to know if someone can advise patient prior to his appointment on 10/09.  Phone#: [phone] (ext: [phone])

## 2022-12-21 NOTE — Telephone Encounter (Signed)
LM to clarify if concerns are regarding his medication or his oxygen levels.  He was seen on the 1st for his oxygen levels,  He is following up with cardiology and will be Following with pulmonary as well. Advised to please call and clarify if this is regarding medication or O2 levels

## 2022-12-21 NOTE — Telephone Encounter (Signed)
Jeffrey Acevedo is returning phone call. Jeffrey Acevedo asked for [place] to leave a detailed voicemail if she does not answer the call.

## 2022-12-27 ENCOUNTER — Ambulatory Visit: Payer: Medicare PPO | Attending: Cardiovascular Disease | Admitting: Cardiovascular Disease

## 2022-12-27 ENCOUNTER — Encounter: Payer: Self-pay | Admitting: Cardiovascular Disease

## 2022-12-27 VITALS — BP 96/60 | HR 61 | Ht 71.0 in | Wt 205.2 lb

## 2022-12-27 DIAGNOSIS — I25119 Atherosclerotic heart disease of native coronary artery with unspecified angina pectoris: Secondary | ICD-10-CM | POA: Diagnosis not present

## 2022-12-27 NOTE — Assessment & Plan Note (Signed)
History of CAD status post LAD bare-metal stenting by myself 04/16/1996.  He has had multiple interventions since that time including March 2011 requiring restenting.  I cathed him 05/02/2021 revealing mid LAD disease that was significant by FFR and I stented him with a Medtronic frontier drug-eluting stent postdilated to 2.8 mm.  He did have a ramus branch lesion as well in the 60% range as well as a PDA lesion which was negative by DFR (0.94).  He came back because of nitro responsive angina and I recath him 09/19/2021 revealing negative DFR on the ramus branch.  I stented his PDA resulting in resolution of his symptoms.  Over the last 6 weeks he has noticed progressive chest pain and shortness of breath now requiring oxygen.  He was admitted to [facility] for 6 days and had a 2D echo that was normal and stress test that was normal as well.  He has had false negative stress test in the past.  I am going to arrange for him to undergo right left heart cath this Friday.  If there is not a cardiac etiology to his symptoms he already has a outpatient pulmonology appointment scheduled for next Wednesday.

## 2022-12-27 NOTE — H&P (View-Only) (Signed)
12/27/2022 [name]   May 19, 1940  [id number]  Primary Physician [name], MD Primary Cardiologist: Runell Gess MD FACP, [place], [place], [place]  HPI:  Jeffrey Acevedo is a 82 y.o.  overweight married Caucasian male, father of 2 and grandfather of 4 grandchildren, whom I last saw in the office 11/14/2022.  He is accompanied by his oldest daughter Gavin Pound today.  He has a history of CAD, status post LAD stenting by me using bare-metal stent April 16, 1996. He had normal LV function at that time with normal renal arteries as well. His other problems include essential hypertension, hyperlipidemia. He had a normal Myoview February 2011 and was scheduled to have elective robotic prostatectomy for prostate cancer by Dr. Crecencio Mc. He developed exertional chest pain similar to his pre-stent symptoms, which were fairly reproducible and because of this, and despite a negative Myoview, I elected to recatheterize him June 17, 2009, revealing a patent proximal LAD stent with 90% stenosis just beyond this, which I then stented with a Multi-Link Vision bare-metal stent with an excellent result. Since I saw him a year ago he's been completely asymptomatic. He did have a Myoview stress test performed 03/31/15 which was entirely normal.. This was performed for preoperative clearance before a right total hip replacement on 04/19/15 which was uneventful.   Because of symptoms of dyspnea and palpitations, I did get a 2D echo which was unremarkable, Myoview stress test which was nonischemic and a event monitor that showed frequent PVCs and short runs of nonsustained ventricular tachycardia and SVT.  He clearly is symptomatic.  Because his last stress test in 2011 was falsely negative and cath subsequently revealed a 90% LAD lesion we decided jointly to proceed with outpatient diagnostic coronary angiography  I performed outpatient diagnostic coronary angiography via the right radial approach on 05/02/2021  revealing patent proximal and mid LAD stents with a 70% proximal to mid LAD stenosis that was physiologically significant by FFR.  I placed a 2.5 mm x 12 mm long Medtronic frontier drug-eluting stent postdilated with a 2.75 mm balloon (2.8 mm).  He also had a 60% tubular proximal PDA stenosis off of the RCA which was negative by DFR (0.94).  He was discharged home the following day.  He did develop COVID that day which has taken 2 weeks to resolve his symptoms of shortness of breath.   Because of continued nitrate responsive chest pain I decided to cath him on 09/19/2021.  I performed DFR of the ramus branch which was negative and I ended up stenting his proximal PDA with a 2.5 x 12 mm long Medtronic Onyx frontier drug-eluting stent deployed at 2.7 mm.  He was discharged home the following day.  His symptoms have completely resolved.   Since I saw him in the office 6 weeks ago at which time he had denied chest pain or shortness of breath he has developed progressive shortness of breath, now oxygen dependent and nitrate responsive chest pain.  He was hospitalized at [facility] in [place] for 6 days on 12/05/2022.  He had a normal 2D echo, normal perfusion study and chest CT.  He was placed on long-acting nitrates.  He continues to be symptomatic.  He said false negative stress test in the past.   Current Meds  Medication Sig   amLODipine (NORVASC) 5 MG tablet Take 1 tablet (5 mg total) by mouth daily.   aspirin EC 81 MG EC tablet Take 1 tablet (81  mg total) by mouth daily. Swallow whole.   Choline Fenofibrate (FENOFIBRIC ACID) 45 MG CPDR Take 1 capsule by mouth once daily   clobetasol ointment (TEMOVATE) 0.05 % Apply 1 Application topically 2 (two) times daily as needed (hives).   clopidogrel (PLAVIX) 75 MG tablet TAKE ONE TABLET BY MOUTH ONCE DAILY (Patient taking differently: Take 75 mg by mouth daily.)   clotrimazole-betamethasone (LOTRISONE) cream Apply 1 Application topically 2 (two) times  daily as needed (skin irritation.).   Cyanocobalamin (VITAMIN B-12 PO) Take 1 tablet by mouth daily.   furosemide (LASIX) 40 MG tablet Take 1 tablet (40 mg total) by mouth daily.   isosorbide mononitrate (IMDUR) 30 MG 24 hr tablet Take 1 tablet by mouth daily.   Magnesium Citrate 200 MG TABS Take 400 mg by mouth daily.   metoprolol tartrate (LOPRESSOR) 25 MG tablet Take 1 tablet by mouth once daily (Patient taking differently: Take 12.5 mg by mouth 2 (two) times daily. Pt is cutting 25 mg tab in half per hospital visit)   nystatin cream (MYCOSTATIN) Apply 1 application  topically daily as needed (skin irritation.).   Omega-3 Fatty Acids (FISH OIL) 1000 MG CAPS Take 1,000 mg by mouth every other day. In the evening, alternating with fenofibrate acid   sertraline (ZOLOFT) 25 MG tablet Take 1 tablet by mouth daily.     Allergies  Allergen Reactions   Adhesive [Tape] Other (See Comments)    Mainly just redness.   Has been ruled out for latex allegery   Hydrochlorothiazide Other (See Comments)    H/o Pancreatitis, takes in Avalide at home   Latex Other (See Comments)    unknown   Vantin [Cefpodoxime] Rash    Social History   Socioeconomic History   Marital status: Married    Spouse name: Not on file   Number of children: Not on file   Years of education: Not on file   Highest education level: Not on file  Occupational History   Not on file  Tobacco Use   Smoking status: Never   Smokeless tobacco: Never  Substance and Sexual Activity   Alcohol use: No   Drug use: No   Sexual activity: Not on file  Other Topics Concern   Not on file  Social History Narrative   Not on file   Social Determinants of Health   Financial Resource Strain: Not on file  Food Insecurity: Low Risk  (12/19/2022)   Received from [facility]   Hunger Vital Sign    Worried About Running Out of Food in the Last Year: Never true    Ran Out of Food in the Last Year: Never true  Transportation Needs: No  Transportation Needs (12/19/2022)   Received from [facility]    In the past 12 months, has lack of reliable transportation kept you from medical appointments, meetings, work or from getting things needed for daily living? : No  Recent Concern: Transportation Needs - Unmet Transportation Needs (12/12/2022)   Received from [facility]    In the past 12 months, has lack of reliable transportation kept you from medical appointments, meetings, work or from getting things needed for daily living? : Yes  Physical Activity: Not on file  Stress: Not on file  Social Connections: Not on file  Intimate Partner Violence: Not on file     Review of Systems: General: negative for chills, fever, night sweats or weight changes.  Cardiovascular: negative for chest  pain, dyspnea on exertion, edema, orthopnea, palpitations, paroxysmal nocturnal dyspnea or shortness of breath Dermatological: negative for rash Respiratory: negative for cough or wheezing Urologic: negative for hematuria Abdominal: negative for nausea, vomiting, diarrhea, bright red blood per rectum, melena, or hematemesis Neurologic: negative for visual changes, syncope, or dizziness All other systems reviewed and are otherwise negative except as noted above.    Blood pressure 96/60, pulse 61, height 5\' 11"  (1.803 m), weight 205 lb 3.2 oz (93.1 kg), SpO2 94%.  General appearance: alert and no distress Neck: no adenopathy, no carotid bruit, no JVD, supple, symmetrical, trachea midline, and thyroid not enlarged, symmetric, no tenderness/mass/nodules Lungs: clear to auscultation bilaterally Heart: regular rate and rhythm, S1, S2 normal, no murmur, click, rub or gallop Extremities: 2+ right lower extremity edema Pulses: 2+ and symmetric Skin: Skin color, texture, turgor normal. No rashes or lesions Neurologic: Grossly normal  EKG not performed today      ASSESSMENT AND PLAN:   CAD (coronary artery  disease) History of CAD status post LAD bare-metal stenting by myself 04/16/1996.  He has had multiple interventions since that time including March 2011 requiring restenting.  I cathed him 05/02/2021 revealing mid LAD disease that was significant by FFR and I stented him with a Medtronic frontier drug-eluting stent postdilated to 2.8 mm.  He did have a ramus branch lesion as well in the 60% range as well as a PDA lesion which was negative by DFR (0.94).  He came back because of nitro responsive angina and I recath him 09/19/2021 revealing negative DFR on the ramus branch.  I stented his PDA resulting in resolution of his symptoms.  Over the last 6 weeks he has noticed progressive chest pain and shortness of breath now requiring oxygen.  He was admitted to [facility] for 6 days and had a 2D echo that was normal and stress test that was normal as well.  He has had false negative stress test in the past.  I am going to arrange for him to undergo right left heart cath this Friday.  If there is not a cardiac etiology to his symptoms he already has a outpatient pulmonology appointment scheduled for next Wednesday.     Runell Gess MD FACP,FACC,FAHA, [facility] 12/27/2022 10:03 AM

## 2022-12-27 NOTE — Patient Instructions (Signed)
Medication Instructions:  Your physician recommends that you continue on your current medications as directed. Please refer to the Current Medication list given to you today.  *If you need a refill on your cardiac medications before your next appointment, please call your pharmacy*    Testing/Procedures: See below   Follow-Up: At [facility], you and your health needs are our priority.  As part of our continuing mission to provide you with exceptional heart care, we have created designated Provider Care Teams.  These Care Teams include your primary Cardiologist (physician) and Advanced Practice Providers (APPs -  Physician Assistants and Nurse Practitioners) who all work together to provide you with the care you need, when you need it.  We recommend signing up for the patient portal called "MyChart".  Sign up information is provided on this After Visit Summary.  MyChart is used to connect with patients for Virtual Visits (Telemedicine).  Patients are able to view lab/test results, encounter notes, upcoming appointments, etc.  Non-urgent messages can be sent to your provider as well.   To learn more about what you can do with MyChart, go to [email or web address].    Your next appointment:   3 month(s)  Provider:   [name], MD     Other Instructions       Cardiac/Peripheral Catheterization   You are scheduled for a Cardiac Catheterization on Friday, October 11 with Dr. Verne Carrow.  1. Please arrive at the [facility] (Main Entrance A) at [facility]: [address] [place], [place] [address] at 10:00 AM (This time is 2 hour(s) before your procedure to ensure your preparation). Free valet parking service is available. You will check in at ADMITTING. The support person will be asked to wait in the waiting room.  It is OK to have someone drop you off and come back when you are ready to be discharged.        Special note: Every effort is made to have  your procedure done on time. Please understand that emergencies sometimes delay scheduled procedures.  2. Diet: Do not eat solid foods after midnight.  You may have clear liquids until 5 AM the day of the procedure.  3. Labs: You will need to have blood drawn- Done  4. Medication instructions in preparation for your procedure:     Stop taking, Lasix (Furosemide)  Friday, October 11,    On the morning of your procedure, take Aspirin 81 mg and Plavix/Clopidogrel and any morning medicines NOT listed above.  You may use sips of water.  5. Plan to go home the same day, you will only stay overnight if medically necessary. 6. You MUST have a responsible adult to drive you home. 7. An adult MUST be with you the first 24 hours after you arrive home. 8. Bring a current list of your medications, and the last time and date medication taken. 9. Bring ID and current insurance cards. 10.Please wear clothes that are easy to get on and off and wear slip-on shoes.  Thank you for allowing [place] to care for you!   -- [facility]

## 2022-12-27 NOTE — Progress Notes (Signed)
12/27/2022 [name]   May 19, 1940  [id number]  Primary Physician [name], MD Primary Cardiologist: Runell Gess MD FACP, [place], [place], [place]  HPI:  Jeffrey Acevedo is a 82 y.o.  overweight married Caucasian male, father of 2 and grandfather of 4 grandchildren, whom I last saw in the office 11/14/2022.  He is accompanied by his oldest daughter Jeffrey Acevedo today.  He has a history of CAD, status post LAD stenting by me using bare-metal stent April 16, 1996. He had normal LV function at that time with normal renal arteries as well. His other problems include essential hypertension, hyperlipidemia. He had a normal Myoview February 2011 and was scheduled to have elective robotic prostatectomy for prostate cancer by Dr. Crecencio Mc. He developed exertional chest pain similar to his pre-stent symptoms, which were fairly reproducible and because of this, and despite a negative Myoview, I elected to recatheterize him June 17, 2009, revealing a patent proximal LAD stent with 90% stenosis just beyond this, which I then stented with a Multi-Link Vision bare-metal stent with an excellent result. Since I saw him a year ago he's been completely asymptomatic. He did have a Myoview stress test performed 03/31/15 which was entirely normal.. This was performed for preoperative clearance before a right total hip replacement on 04/19/15 which was uneventful.   Because of symptoms of dyspnea and palpitations, I did get a 2D echo which was unremarkable, Myoview stress test which was nonischemic and a event monitor that showed frequent PVCs and short runs of nonsustained ventricular tachycardia and SVT.  He clearly is symptomatic.  Because his last stress test in 2011 was falsely negative and cath subsequently revealed a 90% LAD lesion we decided jointly to proceed with outpatient diagnostic coronary angiography  I performed outpatient diagnostic coronary angiography via the right radial approach on 05/02/2021  revealing patent proximal and mid LAD stents with a 70% proximal to mid LAD stenosis that was physiologically significant by FFR.  I placed a 2.5 mm x 12 mm long Medtronic frontier drug-eluting stent postdilated with a 2.75 mm balloon (2.8 mm).  He also had a 60% tubular proximal PDA stenosis off of the RCA which was negative by DFR (0.94).  He was discharged home the following day.  He did develop COVID that day which has taken 2 weeks to resolve his symptoms of shortness of breath.   Because of continued nitrate responsive chest pain I decided to cath him on 09/19/2021.  I performed DFR of the ramus branch which was negative and I ended up stenting his proximal PDA with a 2.5 x 12 mm long Medtronic Onyx frontier drug-eluting stent deployed at 2.7 mm.  He was discharged home the following day.  His symptoms have completely resolved.   Since I saw him in the office 6 weeks ago at which time he had denied chest pain or shortness of breath he has developed progressive shortness of breath, now oxygen dependent and nitrate responsive chest pain.  He was hospitalized at [facility] in [place] for 6 days on 12/05/2022.  He had a normal 2D echo, normal perfusion study and chest CT.  He was placed on long-acting nitrates.  He continues to be symptomatic.  He said false negative stress test in the past.   Current Meds  Medication Sig   amLODipine (NORVASC) 5 MG tablet Take 1 tablet (5 mg total) by mouth daily.   aspirin EC 81 MG EC tablet Take 1 tablet (81  mg total) by mouth daily. Swallow whole.   Choline Fenofibrate (FENOFIBRIC ACID) 45 MG CPDR Take 1 capsule by mouth once daily   clobetasol ointment (TEMOVATE) 0.05 % Apply 1 Application topically 2 (two) times daily as needed (hives).   clopidogrel (PLAVIX) 75 MG tablet TAKE ONE TABLET BY MOUTH ONCE DAILY (Patient taking differently: Take 75 mg by mouth daily.)   clotrimazole-betamethasone (LOTRISONE) cream Apply 1 Application topically 2 (two) times  daily as needed (skin irritation.).   Cyanocobalamin (VITAMIN B-12 PO) Take 1 tablet by mouth daily.   furosemide (LASIX) 40 MG tablet Take 1 tablet (40 mg total) by mouth daily.   isosorbide mononitrate (IMDUR) 30 MG 24 hr tablet Take 1 tablet by mouth daily.   Magnesium Citrate 200 MG TABS Take 400 mg by mouth daily.   metoprolol tartrate (LOPRESSOR) 25 MG tablet Take 1 tablet by mouth once daily (Patient taking differently: Take 12.5 mg by mouth 2 (two) times daily. Pt is cutting 25 mg tab in half per hospital visit)   nystatin cream (MYCOSTATIN) Apply 1 application  topically daily as needed (skin irritation.).   Omega-3 Fatty Acids (FISH OIL) 1000 MG CAPS Take 1,000 mg by mouth every other day. In the evening, alternating with fenofibrate acid   sertraline (ZOLOFT) 25 MG tablet Take 1 tablet by mouth daily.     Allergies  Allergen Reactions   Adhesive [Tape] Other (See Comments)    Mainly just redness.   Has been ruled out for latex allegery   Hydrochlorothiazide Other (See Comments)    H/o Pancreatitis, takes in Avalide at home   Latex Other (See Comments)    unknown   Vantin [Cefpodoxime] Rash    Social History   Socioeconomic History   Marital status: Married    Spouse name: Not on file   Number of children: Not on file   Years of education: Not on file   Highest education level: Not on file  Occupational History   Not on file  Tobacco Use   Smoking status: Never   Smokeless tobacco: Never  Substance and Sexual Activity   Alcohol use: No   Drug use: No   Sexual activity: Not on file  Other Topics Concern   Not on file  Social History Narrative   Not on file   Social Determinants of Health   Financial Resource Strain: Not on file  Food Insecurity: Low Risk  (12/19/2022)   Received from [facility]   Hunger Vital Sign    Worried About Running Out of Food in the Last Year: Never true    Ran Out of Food in the Last Year: Never true  Transportation Needs: No  Transportation Needs (12/19/2022)   Received from [facility]    In the past 12 months, has lack of reliable transportation kept you from medical appointments, meetings, work or from getting things needed for daily living? : No  Recent Concern: Transportation Needs - Unmet Transportation Needs (12/12/2022)   Received from [facility]    In the past 12 months, has lack of reliable transportation kept you from medical appointments, meetings, work or from getting things needed for daily living? : Yes  Physical Activity: Not on file  Stress: Not on file  Social Connections: Not on file  Intimate Partner Violence: Not on file     Review of Systems: General: negative for chills, fever, night sweats or weight changes.  Cardiovascular: negative for chest  pain, dyspnea on exertion, edema, orthopnea, palpitations, paroxysmal nocturnal dyspnea or shortness of breath Dermatological: negative for rash Respiratory: negative for cough or wheezing Urologic: negative for hematuria Abdominal: negative for nausea, vomiting, diarrhea, bright red blood per rectum, melena, or hematemesis Neurologic: negative for visual changes, syncope, or dizziness All other systems reviewed and are otherwise negative except as noted above.    Blood pressure 96/60, pulse 61, height 5\' 11"  (1.803 m), weight 205 lb 3.2 oz (93.1 kg), SpO2 94%.  General appearance: alert and no distress Neck: no adenopathy, no carotid bruit, no JVD, supple, symmetrical, trachea midline, and thyroid not enlarged, symmetric, no tenderness/mass/nodules Lungs: clear to auscultation bilaterally Heart: regular rate and rhythm, S1, S2 normal, no murmur, click, rub or gallop Extremities: 2+ right lower extremity edema Pulses: 2+ and symmetric Skin: Skin color, texture, turgor normal. No rashes or lesions Neurologic: Grossly normal  EKG not performed today      ASSESSMENT AND PLAN:   CAD (coronary artery  disease) History of CAD status post LAD bare-metal stenting by myself 04/16/1996.  He has had multiple interventions since that time including March 2011 requiring restenting.  I cathed him 05/02/2021 revealing mid LAD disease that was significant by FFR and I stented him with a Medtronic frontier drug-eluting stent postdilated to 2.8 mm.  He did have a ramus branch lesion as well in the 60% range as well as a PDA lesion which was negative by DFR (0.94).  He came back because of nitro responsive angina and I recath him 09/19/2021 revealing negative DFR on the ramus branch.  I stented his PDA resulting in resolution of his symptoms.  Over the last 6 weeks he has noticed progressive chest pain and shortness of breath now requiring oxygen.  He was admitted to [facility] for 6 days and had a 2D echo that was normal and stress test that was normal as well.  He has had false negative stress test in the past.  I am going to arrange for him to undergo right left heart cath this Friday.  If there is not a cardiac etiology to his symptoms he already has a outpatient pulmonology appointment scheduled for next Wednesday.     Runell Gess MD FACP,FACC,FAHA, [facility] 12/27/2022 10:03 AM

## 2022-12-28 ENCOUNTER — Telehealth: Payer: Self-pay | Admitting: *Deleted

## 2022-12-28 NOTE — Telephone Encounter (Signed)
Cardiac Catheterization scheduled at [facility] for: Friday December 29, 2022 12 Noon Arrival time [facility] Main Entrance A at: 10 AM  Nothing to eat after midnight prior to procedure, clear liquids until 5 AM day of procedure.  Medication instructions: -Hold:  Lasix-day before and day of procedure -per protocol GFR <60 (51) -Other usual morning medications can be taken with sips of water including aspirin 81 mg and Plavix 75 mg.  Plan to go home the same day, you will only stay overnight if medically necessary.  You must have responsible adult to drive you home.  Someone must be with you the first 24 hours after you arrive home.  Reviewed procedure instructions with patient.

## 2022-12-29 ENCOUNTER — Ambulatory Visit (HOSPITAL_COMMUNITY)
Admission: RE | Disposition: A | Discharge status: Home / Self Care | Payer: Self-pay | Source: Home / Self Care | Attending: Cardiovascular Disease

## 2022-12-29 ENCOUNTER — Other Ambulatory Visit: Payer: Self-pay

## 2022-12-29 ENCOUNTER — Ambulatory Visit (HOSPITAL_COMMUNITY)
Admission: RE | Admit: 2022-12-29 | Discharge: 2022-12-30 | Disposition: A | Discharge status: Home / Self Care | Payer: Medicare PPO | Attending: Cardiovascular Disease | Admitting: Cardiovascular Disease

## 2022-12-29 DIAGNOSIS — I200 Unstable angina: Secondary | ICD-10-CM | POA: Diagnosis present

## 2022-12-29 DIAGNOSIS — I25110 Atherosclerotic heart disease of native coronary artery with unstable angina pectoris: Secondary | ICD-10-CM | POA: Diagnosis not present

## 2022-12-29 DIAGNOSIS — Z9079 Acquired absence of other genital organ(s): Secondary | ICD-10-CM | POA: Insufficient documentation

## 2022-12-29 DIAGNOSIS — Z955 Presence of coronary angioplasty implant and graft: Secondary | ICD-10-CM | POA: Insufficient documentation

## 2022-12-29 DIAGNOSIS — Z8546 Personal history of malignant neoplasm of prostate: Secondary | ICD-10-CM | POA: Insufficient documentation

## 2022-12-29 DIAGNOSIS — Z8616 Personal history of COVID-19: Secondary | ICD-10-CM | POA: Insufficient documentation

## 2022-12-29 DIAGNOSIS — I2510 Atherosclerotic heart disease of native coronary artery without angina pectoris: Secondary | ICD-10-CM | POA: Diagnosis present

## 2022-12-29 DIAGNOSIS — E785 Hyperlipidemia, unspecified: Secondary | ICD-10-CM | POA: Diagnosis present

## 2022-12-29 DIAGNOSIS — R0609 Other forms of dyspnea: Secondary | ICD-10-CM | POA: Diagnosis present

## 2022-12-29 DIAGNOSIS — Z96641 Presence of right artificial hip joint: Secondary | ICD-10-CM | POA: Diagnosis not present

## 2022-12-29 DIAGNOSIS — I10 Essential (primary) hypertension: Secondary | ICD-10-CM | POA: Diagnosis not present

## 2022-12-29 HISTORY — PX: RIGHT/LEFT HEART CATH AND CORONARY ANGIOGRAPHY: CATH118266

## 2022-12-29 HISTORY — PX: CORONARY STENT INTERVENTION: CATH118234

## 2022-12-29 LAB — POCT I-STAT 7, (LYTES, BLD GAS, ICA,H+H)
Acid-base deficit: 2 mmol/L (ref 0.0–2.0)
Bicarbonate: 22.1 mmol/L (ref 20.0–28.0)
Calcium, Ion: 1.17 mmol/L (ref 1.15–1.40)
HCT: 33 % — ABNORMAL LOW (ref 39.0–52.0)
Hemoglobin: 11.2 g/dL — ABNORMAL LOW (ref 13.0–17.0)
O2 Saturation: 88 %
Potassium: 4.1 mmol/L (ref 3.5–5.1)
Sodium: 134 mmol/L — ABNORMAL LOW (ref 135–145)
TCO2: 23 mmol/L (ref 22–32)
pCO2 arterial: 34.2 mm[Hg] (ref 32–48)
pH, Arterial: 7.418 (ref 7.35–7.45)
pO2, Arterial: 54 mm[Hg] — ABNORMAL LOW (ref 83–108)

## 2022-12-29 LAB — POCT I-STAT EG7
Acid-Base Excess: 0 mmol/L (ref 0.0–2.0)
Bicarbonate: 24.7 mmol/L (ref 20.0–28.0)
Calcium, Ion: 1.23 mmol/L (ref 1.15–1.40)
HCT: 35 % — ABNORMAL LOW (ref 39.0–52.0)
Hemoglobin: 11.9 g/dL — ABNORMAL LOW (ref 13.0–17.0)
O2 Saturation: 62 %
Potassium: 4.3 mmol/L (ref 3.5–5.1)
Sodium: 134 mmol/L — ABNORMAL LOW (ref 135–145)
TCO2: 26 mmol/L (ref 22–32)
pCO2, Ven: 39.9 mm[Hg] — ABNORMAL LOW (ref 44–60)
pH, Ven: 7.4 (ref 7.25–7.43)
pO2, Ven: 32 mm[Hg] (ref 32–45)

## 2022-12-29 LAB — GLUCOSE, CAPILLARY: Glucose-Capillary: 167 mg/dL — ABNORMAL HIGH (ref 70–99)

## 2022-12-29 LAB — POCT ACTIVATED CLOTTING TIME: Activated Clotting Time: 305 s

## 2022-12-29 SURGERY — RIGHT/LEFT HEART CATH AND CORONARY ANGIOGRAPHY
Anesthesia: LOCAL

## 2022-12-29 MED ORDER — LABETALOL HCL 5 MG/ML IV SOLN: 10.0000 mg | INTRAVENOUS | Status: AC | PRN

## 2022-12-29 MED ORDER — HEPARIN SODIUM (PORCINE) 1000 UNIT/ML IJ SOLN
INTRAMUSCULAR | Status: AC
  Filled 2022-12-29: qty 10

## 2022-12-29 MED ORDER — ISOSORBIDE MONONITRATE ER 30 MG PO TB24
30.0000 mg | ORAL_TABLET | Freq: Every day | ORAL | Status: DC
  Administered 2022-12-30: 30 mg via ORAL
  Filled 2022-12-29: qty 1

## 2022-12-29 MED ORDER — CLOPIDOGREL BISULFATE 75 MG PO TABS
75.0000 mg | ORAL_TABLET | Freq: Every day | ORAL | Status: DC
  Administered 2022-12-30: 75 mg via ORAL
  Filled 2022-12-29: qty 1

## 2022-12-29 MED ORDER — SODIUM CHLORIDE 0.9% FLUSH: 3.0000 mL | INTRAVENOUS | Status: DC | PRN

## 2022-12-29 MED ORDER — ASPIRIN 81 MG PO CHEW: 81.0000 mg | CHEWABLE_TABLET | ORAL | Status: DC

## 2022-12-29 MED ORDER — AMLODIPINE BESYLATE 5 MG PO TABS
5.0000 mg | ORAL_TABLET | Freq: Every day | ORAL | Status: DC
  Administered 2022-12-30: 5 mg via ORAL
  Filled 2022-12-29: qty 1

## 2022-12-29 MED ORDER — FENTANYL CITRATE (PF) 100 MCG/2ML IJ SOLN
INTRAMUSCULAR | Status: DC | PRN
  Administered 2022-12-29: 25 ug via INTRAVENOUS

## 2022-12-29 MED ORDER — VERAPAMIL HCL 2.5 MG/ML IV SOLN
INTRAVENOUS | Status: DC | PRN
  Administered 2022-12-29: 10 mL via INTRA_ARTERIAL

## 2022-12-29 MED ORDER — HEPARIN SODIUM (PORCINE) 1000 UNIT/ML IJ SOLN
INTRAMUSCULAR | Status: DC | PRN
  Administered 2022-12-29 (×2): 5000 [IU] via INTRAVENOUS

## 2022-12-29 MED ORDER — HYDRALAZINE HCL 20 MG/ML IJ SOLN: 10.0000 mg | INTRAMUSCULAR | Status: AC | PRN

## 2022-12-29 MED ORDER — MIDAZOLAM HCL 2 MG/2ML IJ SOLN
INTRAMUSCULAR | Status: DC | PRN
  Administered 2022-12-29: 1 mg via INTRAVENOUS

## 2022-12-29 MED ORDER — ASPIRIN 81 MG PO TBEC
81.0000 mg | DELAYED_RELEASE_TABLET | Freq: Every day | ORAL | Status: DC
  Administered 2022-12-30: 81 mg via ORAL
  Filled 2022-12-29: qty 1

## 2022-12-29 MED ORDER — CLOPIDOGREL BISULFATE 300 MG PO TABS
ORAL_TABLET | ORAL | Status: DC | PRN
  Administered 2022-12-29: 300 mg via ORAL

## 2022-12-29 MED ORDER — CLOPIDOGREL BISULFATE 300 MG PO TABS
ORAL_TABLET | ORAL | Status: AC
  Filled 2022-12-29: qty 1

## 2022-12-29 MED ORDER — ONDANSETRON HCL 4 MG/2ML IJ SOLN: 4.0000 mg | Freq: Four times a day (QID) | INTRAMUSCULAR | Status: DC | PRN

## 2022-12-29 MED ORDER — METOPROLOL TARTRATE 12.5 MG HALF TABLET
25.0000 mg | ORAL_TABLET | Freq: Two times a day (BID) | ORAL | Status: DC
  Administered 2022-12-29 – 2022-12-30 (×2): 25 mg via ORAL
  Filled 2022-12-29 (×2): qty 2

## 2022-12-29 MED ORDER — NITROGLYCERIN 1 MG/10 ML FOR IR/CATH LAB
INTRA_ARTERIAL | Status: AC
  Filled 2022-12-29: qty 10

## 2022-12-29 MED ORDER — SODIUM CHLORIDE 0.9 % IV SOLN: INTRAVENOUS | Status: AC

## 2022-12-29 MED ORDER — ACETAMINOPHEN 325 MG PO TABS: 650.0000 mg | ORAL_TABLET | ORAL | Status: DC | PRN

## 2022-12-29 MED ORDER — SODIUM CHLORIDE 0.9 % IV SOLN: 250.0000 mL | INTRAVENOUS | Status: DC | PRN

## 2022-12-29 MED ORDER — NITROGLYCERIN 0.4 MG SL SUBL: 0.4000 mg | SUBLINGUAL_TABLET | SUBLINGUAL | Status: DC | PRN

## 2022-12-29 MED ORDER — FLUTICASONE PROPIONATE 50 MCG/ACT NA SUSP: 1.0000 | Freq: Every day | NASAL | Status: DC

## 2022-12-29 MED ORDER — SODIUM CHLORIDE 0.9 % WEIGHT BASED INFUSION: 1.0000 mL/kg/h | INTRAVENOUS | Status: DC

## 2022-12-29 MED ORDER — SODIUM CHLORIDE 0.9% FLUSH
3.0000 mL | Freq: Two times a day (BID) | INTRAVENOUS | Status: DC
  Administered 2022-12-30: 3 mL via INTRAVENOUS

## 2022-12-29 MED ORDER — LIDOCAINE HCL (PF) 1 % IJ SOLN
INTRAMUSCULAR | Status: AC
  Filled 2022-12-29: qty 30

## 2022-12-29 MED ORDER — MIDAZOLAM HCL 2 MG/2ML IJ SOLN
INTRAMUSCULAR | Status: AC
  Filled 2022-12-29: qty 2

## 2022-12-29 MED ORDER — SERTRALINE HCL 25 MG PO TABS
25.0000 mg | ORAL_TABLET | Freq: Every day | ORAL | Status: DC
  Administered 2022-12-30: 25 mg via ORAL
  Filled 2022-12-29: qty 1

## 2022-12-29 MED ORDER — LIDOCAINE HCL (PF) 1 % IJ SOLN
INTRAMUSCULAR | Status: DC | PRN
  Administered 2022-12-29 (×2): 2 mL

## 2022-12-29 MED ORDER — FENTANYL CITRATE (PF) 100 MCG/2ML IJ SOLN
INTRAMUSCULAR | Status: AC
  Filled 2022-12-29: qty 2

## 2022-12-29 MED ORDER — HEPARIN (PORCINE) IN NACL 1000-0.9 UT/500ML-% IV SOLN
INTRAVENOUS | Status: DC | PRN
  Administered 2022-12-29 (×2): 500 mL

## 2022-12-29 MED ORDER — FUROSEMIDE 20 MG PO TABS
40.0000 mg | ORAL_TABLET | Freq: Every day | ORAL | Status: DC
  Administered 2022-12-30: 40 mg via ORAL
  Filled 2022-12-29: qty 2

## 2022-12-29 MED ORDER — VERAPAMIL HCL 2.5 MG/ML IV SOLN
INTRAVENOUS | Status: AC
  Filled 2022-12-29: qty 2

## 2022-12-29 MED ORDER — IOHEXOL 350 MG/ML SOLN
INTRAVENOUS | Status: DC | PRN
  Administered 2022-12-29: 133 mL

## 2022-12-29 MED ORDER — SODIUM CHLORIDE 0.9 % WEIGHT BASED INFUSION
3.0000 mL/kg/h | INTRAVENOUS | Status: DC
  Administered 2022-12-29: 3 mL/kg/h via INTRAVENOUS

## 2022-12-29 SURGICAL SUPPLY — 19 items
BALL SAPPHIRE NC24 3.0X12 (BALLOONS) ×1
BALLN EMERGE MR 2.5X12 (BALLOONS) ×1
BALLOON EMERGE MR 2.5X12 (BALLOONS) IMPLANT
BALLOON SAPPHIRE NC24 3.0X12 (BALLOONS) IMPLANT
CATH 5FR JL3.5 JR4 ANG PIG MP (CATHETERS) IMPLANT
CATH BALLN WEDGE 5F 110CM (CATHETERS) IMPLANT
CATH VISTA GUIDE 6FR XBLAD3.5 (CATHETERS) IMPLANT
DEVICE RAD COMP TR BAND LRG (VASCULAR PRODUCTS) IMPLANT
ELECT DEFIB PAD ADLT CADENCE (PAD) IMPLANT
GLIDESHEATH SLEND SS 6F .021 (SHEATH) IMPLANT
GUIDEWIRE INQWIRE 1.5J.035X260 (WIRE) IMPLANT
INQWIRE 1.5J .035X260CM (WIRE) ×1
KIT ENCORE 26 ADVANTAGE (KITS) IMPLANT
PACK CARDIAC CATHETERIZATION (CUSTOM PROCEDURE TRAY) ×1 IMPLANT
SET ATX-X65L (MISCELLANEOUS) IMPLANT
SHEATH GLIDE SLENDER 4/5FR (SHEATH) IMPLANT
STENT SYNERGY XD 2.75X20 (Permanent Stent) IMPLANT
SYNERGY XD 2.75X20 (Permanent Stent) ×1 IMPLANT
WIRE ASAHI PROWATER 180CM (WIRE) IMPLANT

## 2022-12-29 NOTE — Interval H&P Note (Signed)
History and Physical Interval Note:  12/29/2022 10:37 AM  Jeffrey Acevedo  has presented today for surgery, with the diagnosis of chest pain.  The various methods of treatment have been discussed with the patient and family. After consideration of risks, benefits and other options for treatment, the patient has consented to  Procedure(s): RIGHT/LEFT HEART CATH AND CORONARY ANGIOGRAPHY (N/A) as a surgical intervention.  The patient's history has been reviewed, patient examined, no change in status, stable for surgery.  I have reviewed the patient's chart and labs.  Questions were answered to the patient's satisfaction.    Cath Lab Visit (complete for each Cath Lab visit)  Clinical Evaluation Leading to the Procedure:   ACS: No.  Non-ACS:    Anginal Classification: CCS III  Anti-ischemic medical therapy: Maximal Therapy (2 or more classes of medications)  Non-Invasive Test Results: No non-invasive testing performed  Prior CABG: No previous CABG        Verne Carrow

## 2022-12-29 NOTE — Plan of Care (Signed)
  Problem: Activity: Goal: Ability to return to baseline activity level will improve Outcome: Progressing   Problem: Cardiovascular: Goal: Ability to achieve and maintain adequate cardiovascular perfusion will improve Outcome: Progressing Goal: Vascular access site(s) Level 0-1 will be maintained Outcome: Completed/Met   

## 2022-12-30 ENCOUNTER — Other Ambulatory Visit: Payer: Self-pay | Admitting: Physician Assistant

## 2022-12-30 DIAGNOSIS — Z96641 Presence of right artificial hip joint: Secondary | ICD-10-CM | POA: Diagnosis not present

## 2022-12-30 DIAGNOSIS — I25110 Atherosclerotic heart disease of native coronary artery with unstable angina pectoris: Secondary | ICD-10-CM | POA: Diagnosis not present

## 2022-12-30 DIAGNOSIS — I200 Unstable angina: Secondary | ICD-10-CM | POA: Diagnosis not present

## 2022-12-30 DIAGNOSIS — I10 Essential (primary) hypertension: Secondary | ICD-10-CM | POA: Diagnosis not present

## 2022-12-30 DIAGNOSIS — E785 Hyperlipidemia, unspecified: Secondary | ICD-10-CM | POA: Diagnosis not present

## 2022-12-30 LAB — BASIC METABOLIC PANEL
Anion gap: 7 (ref 5–15)
BUN: 18 mg/dL (ref 8–23)
CO2: 25 mmol/L (ref 22–32)
Calcium: 8.8 mg/dL — ABNORMAL LOW (ref 8.9–10.3)
Chloride: 100 mmol/L (ref 98–111)
Creatinine, Ser: 1.2 mg/dL (ref 0.61–1.24)
GFR, Estimated: >60 mL/min (ref >60)
Glucose, Bld: 111 mg/dL — ABNORMAL HIGH (ref 70–99)
Potassium: 4.3 mmol/L (ref 3.5–5.1)
Sodium: 132 mmol/L — ABNORMAL LOW (ref 135–145)

## 2022-12-30 LAB — CBC
HCT: 37.3 % — ABNORMAL LOW (ref 39.0–52.0)
Hemoglobin: 12.2 g/dL — ABNORMAL LOW (ref 13.0–17.0)
MCH: 27.5 pg (ref 26.0–34.0)
MCHC: 32.7 g/dL (ref 30.0–36.0)
MCV: 84 fL (ref 80.0–100.0)
Platelets: 215 10*3/uL (ref 150–400)
RBC: 4.44 MIL/uL (ref 4.22–5.81)
RDW: 15.8 % — ABNORMAL HIGH (ref 11.5–15.5)
WBC: 8 10*3/uL (ref 4.0–10.5)
nRBC: 0 % (ref 0.0–0.2)

## 2022-12-30 LAB — GLUCOSE, CAPILLARY
Glucose-Capillary: 124 mg/dL — ABNORMAL HIGH (ref 70–99)
Glucose-Capillary: 122 mg/dL — ABNORMAL HIGH (ref 70–99)

## 2022-12-30 MED ORDER — CLOPIDOGREL BISULFATE 75 MG PO TABS: 75.0000 mg | ORAL_TABLET | Freq: Every day | ORAL | 3 refills | Status: DC

## 2022-12-30 MED ORDER — METOPROLOL TARTRATE 25 MG PO TABS: 25.0000 mg | ORAL_TABLET | Freq: Every day | ORAL | 3 refills | Status: DC

## 2022-12-30 NOTE — TOC Transition Note (Addendum)
Transition of Care [facility]) - CM/SW Discharge Note   Patient Details  Name: Jeffrey Acevedo MRN: [id number] Date of Birth: 06-01-1940  Transition of Care [facility]) CM/SW Contact:  Ronny Bacon, RN Phone Number: 12/30/2022, 11:41 AM   Clinical Narrative:  Patient is being discharged today. Spoke with Wife by phone, patient is on 3L home O2. Daughter at bedside and will transport patient home at discharge.    Final next level of care: Home/Self Care Barriers to Discharge: No Barriers Identified   Patient Goals and CMS Choice      Discharge Placement                         Discharge Plan and Services Additional resources added to the After Visit Summary for                                       Social Determinants of Health (SDOH) Interventions SDOH Screenings   Food Insecurity: Low Risk  (12/19/2022)   Received from [facility]  Transportation Needs: No Transportation Needs (12/19/2022)   Received from [facility]  Recent Concern: Transportation Needs - Unmet Transportation Needs (12/12/2022)   Received from [facility]  Utilities: Low Risk  (12/19/2022)   Received from [facility]  Tobacco Use: Low Risk  (12/27/2022)     Readmission Risk Interventions     No data to display

## 2022-12-30 NOTE — Discharge Summary (Addendum)
Discharge Summary    Patient ID: Jeffrey Acevedo MRN: [id number]; DOB: 12/18/1940  Admit date: 12/29/2022 Discharge date: 12/30/2022  PCP:  [name], MD   [facility] Providers Cardiologist:  [name], MD        Discharge Diagnoses    Principal Problem:   Unstable angina [facility]) Active Problems:   Essential hypertension   Hyperlipidemia   CAD (coronary artery disease)   Dyspnea on exertion    Diagnostic Studies/Procedures    Cath 12/29/2022   Prox Cx to Mid Cx lesion is 40% stenosed.   Ost RCA to Prox RCA lesion is 30% stenosed.   Ramus lesion is 60% stenosed.   Mid LAD-1 lesion is 90% stenosed.   Previously placed Prox LAD to Mid LAD stent of unknown type is  widely patent.   Previously placed Mid LAD-3 stent of unknown type is  widely patent.   Non-stenotic RPDA lesion was previously treated.   A drug-eluting stent was successfully placed using a SYNERGY XD 2.75X20.   Post intervention, there is a 0% residual stenosis.   Post intervention, there is a 0% residual stenosis.   Patent stents in the proximal and mid LAD. Hazy severe stenosis in the proximal to mid LAD between the old stents.  Successful PTCA/DES x 1 proximal to mid LAD Moderate stenosis in the intermediate branch, unchanged from last cath (Flow wire negative last cath) Mild disease in the Circumflex Large dominant RCA with mild proximal disease and patent PDA stent without restenosis.  Normal right heart pressures LVEDP 16-18 mmHg   Recommendations: Continue DAPT with ASA/Plavix for lifetime. Continue Lasix.     Diagnostic Dominance: Right  Intervention    _____________   History of Present Illness     Jeffrey Acevedo is a 82 y.o. male with a history of CAD, status post LAD stenting by me using bare-metal stent April 16, 1996. He had normal LV function at that time with normal renal arteries as well. His other problems include essential hypertension, hyperlipidemia.  He had a normal Myoview February 2011 and was scheduled to have elective robotic prostatectomy for prostate cancer by Dr. Crecencio Mc. He developed exertional chest pain similar to his pre-stent symptoms, which were fairly reproducible and because of this, and despite a negative Myoview, elected to recatheterize him June 17, 2009, revealing a patent proximal LAD stent with 90% stenosis just beyond this, which I then stented with a Multi-Link Vision bare-metal stent with an excellent result. He did have a Myoview stress test performed 03/31/15 which was entirely normal.. This was performed for preoperative clearance before a right total hip replacement on 04/19/15 which was uneventful.   Because of symptoms of dyspnea and palpitations, did get a 2D echo which was unremarkable, Myoview stress test which was nonischemic and a event monitor that showed frequent PVCs and short runs of nonsustained ventricular tachycardia and SVT.  He clearly is symptomatic.  Because his last stress test in 2011 was falsely negative and cath subsequently revealed a 90% LAD lesion we decided jointly to proceed with outpatient diagnostic coronary angiography  performed outpatient diagnostic coronary angiography via the right radial approach on 05/02/2021 revealing patent proximal and mid LAD stents with a 70% proximal to mid LAD stenosis that was physiologically significant by FFR. placed a 2.5 mm x 12 mm long Medtronic frontier drug-eluting stent postdilated with a 2.75 mm balloon (2.8 mm).  He also had a 60% tubular proximal PDA stenosis off of  the RCA which was negative by DFR (0.94).  He was discharged home the following day.  He did develop COVID that day which has taken 2 weeks to resolve his symptoms of shortness of breath.   Because of continued nitrate responsive chest pain, decided to cath him on 09/19/2021. performed DFR of the ramus branch which was negative and I ended up stenting his proximal PDA with a 2.5 x 12 mm long  Medtronic Onyx frontier drug-eluting stent deployed at 2.7 mm.  He was discharged home the following day.  His symptoms have completely resolved.  Seen in the office 6 weeks ago at which time he had denied chest pain or shortness of breath he has developed progressive shortness of breath, now oxygen dependent and nitrate responsive chest pain.  He was hospitalized at [facility] in [place] for 6 days on 12/05/2022.  He had a normal 2D echo, normal perfusion study and chest CT.  He was placed on long-acting nitrates.  He continues to be symptomatic.  He said false negative stress test in the past.    Hospital Course     Consultants: N/A   Patient was recently seen by Dr. York Ram in the office on 12/27/2022 at which time he described progressive anginal symptom in the past 6 weeks.  Even though he was recently admitted at [facility] and had a normal echocardiogram and stress test, however patient had a prior history of false negative stress test in the past.  Given persistent symptom, it was recommended for the patient to proceed with definitive evaluation via cardiac catheterization.  Outpatient cardiac catheterization performed on 12/29/2022 demonstrated 40% proximal to mid left circumflex lesion, 30% ostial to proximal RCA lesion, 60% ramus lesion, 90% mid LAD lesion treated with Synergy XD 2.75 x 20 mm DES, patent proximal to mid LAD stent.  Normal right heart pressure.  LVEDP 16-18 mmHg.  Postprocedure, patient was kept on aspirin and Plavix with recommendation to continue dual antiplatelet therapy indefinitely.  He was seen in the morning of 12/30/2022 at which time he denied any chest discomfort or shortness of breath.  He ambulated with cardiac rehab this morning, O2 saturation did drop down to 84% on 4 L nasal cannula.  O2 saturation maintained between 86 to 97%.  He has a follow-up with pulmonology service next week.  Patient was seen by Dr. Jacques Navy at which time he was doing  well.  He is deemed stable for discharge from the cardiac perspective.      Did the patient have an acute coronary syndrome (MI, NSTEMI, STEMI, etc) this admission?:  No                               Did the patient have a percutaneous coronary intervention (stent / angioplasty)?:  Yes.     Cath/PCI Registry Performance & Quality Measures: Aspirin prescribed? - Yes ADP Receptor Inhibitor (Plavix/Clopidogrel, Brilinta/Ticagrelor or Effient/Prasugrel) prescribed (includes medically managed patients)? - Yes High Intensity Statin (Lipitor 40-80mg  or Crestor 20-40mg ) prescribed? - Yes For EF <40%, was ACEI/ARB prescribed? - Not Applicable (EF >/= 40%) For EF <40%, Aldosterone Antagonist (Spironolactone or Eplerenone) prescribed? - Not Applicable (EF >/= 40%) Cardiac Rehab Phase II ordered? - Yes         _____________  Discharge Vitals Blood pressure 107/68, pulse 64, temperature 97.8 F (36.6 C), temperature source Oral, resp. rate 18, height 6' (1.829 m), weight 89.8 kg,  SpO2 94%.  Filed Weights   12/29/22 1212  Weight: 89.8 kg    Physical Exam   GEN: No acute distress.   Neck: No JVD Cardiac: RRR, no murmurs, rubs, or gallops.  Respiratory: Clear to auscultation bilaterally. GI: Soft, nontender, non-distended  MS: No edema; No deformity. Neuro:  Nonfocal  Psych: Normal affect   Labs & Radiologic Studies    CBC Recent Labs    12/29/22 1235 12/30/22 0727  WBC  --  8.0  HGB 11.2* 12.2*  HCT 33.0* 37.3*  MCV  --  84.0  PLT  --  215   Basic Metabolic Panel Recent Labs    16/10/96 1235 12/30/22 0727  NA 134* 132*  K 4.1 4.3  CL  --  100  CO2  --  25  GLUCOSE  --  111*  BUN  --  18  CREATININE  --  1.20  CALCIUM  --  8.8*   Liver Function Tests No results for input(s): "AST", "ALT", "ALKPHOS", "BILITOT", "PROT", "ALBUMIN" in the last 72 hours. No results for input(s): "LIPASE", "AMYLASE" in the last 72 hours. High Sensitivity Troponin:   No results for  input(s): "TROPONINIHS" in the last 720 hours.  BNP Invalid input(s): "POCBNP" D-Dimer No results for input(s): "DDIMER" in the last 72 hours. Hemoglobin A1C No results for input(s): "HGBA1C" in the last 72 hours. Fasting Lipid Panel No results for input(s): "CHOL", "HDL", "LDLCALC", "TRIG", "CHOLHDL", "LDLDIRECT" in the last 72 hours. Thyroid Function Tests No results for input(s): "TSH", "T4TOTAL", "T3FREE", "THYROIDAB" in the last 72 hours.  Invalid input(s): "FREET3" _____________  CARDIAC CATHETERIZATION  Result Date: 12/29/2022   Prox Cx to Mid Cx lesion is 40% stenosed.   Ost RCA to Prox RCA lesion is 30% stenosed.   Ramus lesion is 60% stenosed.   Mid LAD-1 lesion is 90% stenosed.   Previously placed Prox LAD to Mid LAD stent of unknown type is  widely patent.   Previously placed Mid LAD-3 stent of unknown type is  widely patent.   Non-stenotic RPDA lesion was previously treated.   A drug-eluting stent was successfully placed using a SYNERGY XD 2.75X20.   Post intervention, there is a 0% residual stenosis.   Post intervention, there is a 0% residual stenosis. Patent stents in the proximal and mid LAD. Hazy severe stenosis in the proximal to mid LAD between the old stents. Successful PTCA/DES x 1 proximal to mid LAD Moderate stenosis in the intermediate branch, unchanged from last cath (Flow wire negative last cath) Mild disease in the Circumflex Large dominant RCA with mild proximal disease and patent PDA stent without restenosis. Normal right heart pressures LVEDP 16-18 mmHg Recommendations: Continue DAPT with ASA/Plavix for lifetime. Continue Lasix.   Disposition   Pt is being discharged home today in good condition.  Follow-up Plans & Appointments     Follow-up Information     Joylene Grapes, NP Follow up on 01/12/2023.   Specialties: Cardiology, Family Medicine Why: 8:50AM. Post cath follow up Contact information: [address] [place]  [address] [phone]         Runell Gess, MD Follow up on 04/03/2023.   Specialties: Cardiology, Radiology Why: 9:45AM. Cardiology follow up Contact information: [address] [place] [address] [phone]                Discharge Instructions     Amb Referral to Cardiac Rehabilitation   Complete by: As directed  Diagnosis: Coronary Stents   After initial evaluation and assessments completed: Virtual Based Care may be provided alone or in conjunction with Phase 2 Cardiac Rehab based on patient barriers.: Yes   Intensive Cardiac Rehabilitation (ICR) [facility] location only OR Traditional Cardiac Rehabilitation (TCR) *If criteria for ICR are not met will enroll in TCR [facility] only): Yes   Diet - low sodium heart healthy   Complete by: As directed    Discharge instructions   Complete by: As directed    No driving for 24 hours. No lifting over 5 lbs for 1 week. No sexual activity for 1 week. Keep procedure site clean & dry. If you notice increased pain, swelling, bleeding or pus, call/return!  You may shower, but no soaking baths/hot tubs/pools for 1 week.   Increase activity slowly   Complete by: As directed         Discharge Medications   Allergies as of 12/30/2022       Reactions   Adhesive [tape] Other (See Comments)   Mainly just redness.   Has been ruled out for latex allegery   Hydrochlorothiazide Other (See Comments)   H/o Pancreatitis, takes in Avalide at home   Latex Other (See Comments)   unknown   Vantin [cefpodoxime] Rash        Medication List     TAKE these medications    amLODipine 5 MG tablet Commonly known as: NORVASC Take 1 tablet (5 mg total) by mouth daily.   aspirin EC 81 MG tablet Take 1 tablet (81 mg total) by mouth daily. Swallow whole.   clopidogrel 75 MG tablet Commonly known as: PLAVIX Take 1 tablet (75 mg total) by mouth daily.   Fenofibric Acid 45 MG Cpdr Take 1 capsule by mouth once daily What  changed:  how much to take when to take this   Fish Oil 1000 MG Caps Take 1,000 mg by mouth every other day. In the evening, alternating with fenofibrate acid   fluticasone 50 MCG/ACT nasal spray Commonly known as: FLONASE Place 1 spray into both nostrils daily.   furosemide 40 MG tablet Commonly known as: LASIX Take 1 tablet (40 mg total) by mouth daily.   isosorbide mononitrate 30 MG 24 hr tablet Commonly known as: IMDUR Take 30 mg by mouth daily.   Magnesium Citrate 200 MG Tabs Take 400 mg by mouth daily.   metoprolol tartrate 25 MG tablet Commonly known as: LOPRESSOR Take 1 tablet (25 mg total) by mouth 2 (two) times daily.   nitroGLYCERIN 0.4 MG SL tablet Commonly known as: Nitrostat Place 1 tablet (0.4 mg total) under the tongue every 5 (five) minutes as needed for chest pain.   oxymetazoline 0.05 % nasal spray Commonly known as: AFRIN Place 1 spray into both nostrils 2 (two) times daily as needed for congestion.   sertraline 25 MG tablet Commonly known as: ZOLOFT Take 25 mg by mouth daily.   VITAMIN B-12 PO Take 2,500 mcg by mouth daily.           Outstanding Labs/Studies   N/A  Duration of Discharge Encounter   Greater than 30 minutes including physician time.  Ramond Dial, PA 12/30/2022, 11:10 AM   Patient seen and examined with Azalee Course PA.  Agree as above, with the following exceptions and changes as noted below. Feels well, no CP, radial site stable. Gen: NAD, CV: RRR, no murmurs, Lungs: clear, Abd: soft, Extrem: Warm, well perfused, no edema, Neuro/Psych: alert and oriented x  3, normal mood and affect. All available labs, radiology testing, previous records reviewed. Stable for hospital discharge as noted above.  Parke Poisson, MD 12/30/22 12:00 PM

## 2022-12-30 NOTE — Progress Notes (Addendum)
CARDIAC REHAB PHASE I   PRE:  Rate/Rhythm: Sinus Rhythm 68  BP:  Supine: 122/66     SaO2: 92%  MODE:  Ambulation: 400 ft   POST:  Rate/Rhythem: 64  BP:  Supine: 126/63    SaO2: 96 3l/min  0745-0900  Patient ambulated in the hallway initially on 4l/min oxygen saturation dropped to 86%. Increased O2 to 5/min. Patient's oxygen saturations stayed between 86-97%. Mr Mandy took several rest breaks. Tolerated well. Patient assisted back to bed with call light within reach.  Instructed patient how to purse lip breath. Patient has stent card. Reviewed use of sublingual nitroglycerin and when to call 911. Heart Healthy diet discussed with the patient and his daughter.  Reviewed exercise instructions, end points of exercise. Mr Rotert says he may be interested in cardiac rehab at a later date and time as he is receiving home health care and PT at home now. Patient would benefit from having a rollator at home. Patient's daughter will discuss with home health team upon discharge.   Thayer Headings RN

## 2023-01-01 ENCOUNTER — Encounter (HOSPITAL_COMMUNITY): Payer: Self-pay | Admitting: Cardiovascular Disease

## 2023-01-01 MED FILL — Nitroglycerin IV Soln 100 MCG/ML in D5W: INTRA_ARTERIAL | Qty: 10 | Status: AC

## 2023-01-02 ENCOUNTER — Telehealth (HOSPITAL_COMMUNITY): Payer: Self-pay

## 2023-01-02 NOTE — Telephone Encounter (Signed)
Per phase I cardiac rehab, fax referral to [facility].

## 2023-01-03 ENCOUNTER — Ambulatory Visit: Payer: Medicare PPO | Admitting: Pulmonary Disease

## 2023-01-03 ENCOUNTER — Ambulatory Visit
Admission: RE | Admit: 2023-01-03 | Discharge: 2023-01-03 | Disposition: A | Discharge status: Home / Self Care | Payer: Medicare PPO | Source: Ambulatory Visit | Attending: Pulmonary Disease | Admitting: Pulmonary Disease

## 2023-01-03 ENCOUNTER — Other Ambulatory Visit: Payer: Self-pay

## 2023-01-03 ENCOUNTER — Encounter (HOSPITAL_COMMUNITY): Payer: Self-pay

## 2023-01-03 ENCOUNTER — Other Ambulatory Visit
Admission: RE | Admit: 2023-01-03 | Discharge: 2023-01-03 | Disposition: A | Discharge status: Home / Self Care | Payer: Medicare PPO | Source: Ambulatory Visit | Attending: Pulmonary Disease | Admitting: Pulmonary Disease

## 2023-01-03 ENCOUNTER — Encounter: Payer: Self-pay | Admitting: Pulmonary Disease

## 2023-01-03 ENCOUNTER — Telehealth: Payer: Self-pay

## 2023-01-03 ENCOUNTER — Ambulatory Visit
Admission: RE | Admit: 2023-01-03 | Discharge: 2023-01-03 | Disposition: A | Discharge status: Home / Self Care | Payer: Medicare PPO | Source: Ambulatory Visit | Attending: Pulmonary Disease

## 2023-01-03 ENCOUNTER — Telehealth: Payer: Self-pay | Admitting: Cardiovascular Disease

## 2023-01-03 ENCOUNTER — Inpatient Hospital Stay (HOSPITAL_COMMUNITY)
Admission: AD | Admit: 2023-01-03 | Discharge: 2023-01-06 | DRG: 291 | Disposition: A | Discharge status: Home Health | Payer: Medicare PPO | Source: Ambulatory Visit | Attending: Internal Medicine | Admitting: Internal Medicine

## 2023-01-03 ENCOUNTER — Encounter (HOSPITAL_COMMUNITY): Payer: Self-pay | Admitting: Internal Medicine

## 2023-01-03 VITALS — BP 120/60 | HR 59 | Temp 97.6°F | Ht 72.0 in | Wt 207.6 lb

## 2023-01-03 DIAGNOSIS — J9611 Chronic respiratory failure with hypoxia: Secondary | ICD-10-CM | POA: Diagnosis present

## 2023-01-03 DIAGNOSIS — N179 Acute kidney failure, unspecified: Secondary | ICD-10-CM | POA: Diagnosis present

## 2023-01-03 DIAGNOSIS — Z7982 Long term (current) use of aspirin: Secondary | ICD-10-CM

## 2023-01-03 DIAGNOSIS — J9811 Atelectasis: Secondary | ICD-10-CM | POA: Diagnosis present

## 2023-01-03 DIAGNOSIS — Z9981 Dependence on supplemental oxygen: Secondary | ICD-10-CM

## 2023-01-03 DIAGNOSIS — N182 Chronic kidney disease, stage 2 (mild): Secondary | ICD-10-CM | POA: Diagnosis present

## 2023-01-03 DIAGNOSIS — J309 Allergic rhinitis, unspecified: Secondary | ICD-10-CM | POA: Diagnosis present

## 2023-01-03 DIAGNOSIS — E782 Mixed hyperlipidemia: Secondary | ICD-10-CM | POA: Diagnosis not present

## 2023-01-03 DIAGNOSIS — I5033 Acute on chronic diastolic (congestive) heart failure: Secondary | ICD-10-CM | POA: Diagnosis present

## 2023-01-03 DIAGNOSIS — R0602 Shortness of breath: Secondary | ICD-10-CM | POA: Insufficient documentation

## 2023-01-03 DIAGNOSIS — J301 Allergic rhinitis due to pollen: Secondary | ICD-10-CM | POA: Diagnosis not present

## 2023-01-03 DIAGNOSIS — Z79899 Other long term (current) drug therapy: Secondary | ICD-10-CM

## 2023-01-03 DIAGNOSIS — E871 Hypo-osmolality and hyponatremia: Secondary | ICD-10-CM | POA: Diagnosis present

## 2023-01-03 DIAGNOSIS — Z881 Allergy status to other antibiotic agents status: Secondary | ICD-10-CM

## 2023-01-03 DIAGNOSIS — E785 Hyperlipidemia, unspecified: Secondary | ICD-10-CM | POA: Diagnosis present

## 2023-01-03 DIAGNOSIS — I2510 Atherosclerotic heart disease of native coronary artery without angina pectoris: Secondary | ICD-10-CM | POA: Diagnosis present

## 2023-01-03 DIAGNOSIS — Z7902 Long term (current) use of antithrombotics/antiplatelets: Secondary | ICD-10-CM | POA: Diagnosis not present

## 2023-01-03 DIAGNOSIS — Z96641 Presence of right artificial hip joint: Secondary | ICD-10-CM | POA: Diagnosis present

## 2023-01-03 DIAGNOSIS — Z955 Presence of coronary angioplasty implant and graft: Secondary | ICD-10-CM | POA: Diagnosis not present

## 2023-01-03 DIAGNOSIS — Z8546 Personal history of malignant neoplasm of prostate: Secondary | ICD-10-CM | POA: Diagnosis not present

## 2023-01-03 DIAGNOSIS — I130 Hypertensive heart and chronic kidney disease with heart failure and stage 1 through stage 4 chronic kidney disease, or unspecified chronic kidney disease: Secondary | ICD-10-CM | POA: Diagnosis present

## 2023-01-03 DIAGNOSIS — Z9861 Coronary angioplasty status: Secondary | ICD-10-CM

## 2023-01-03 DIAGNOSIS — N189 Chronic kidney disease, unspecified: Secondary | ICD-10-CM | POA: Diagnosis present

## 2023-01-03 DIAGNOSIS — I10 Essential (primary) hypertension: Secondary | ICD-10-CM | POA: Diagnosis present

## 2023-01-03 DIAGNOSIS — Z888 Allergy status to other drugs, medicaments and biological substances status: Secondary | ICD-10-CM

## 2023-01-03 DIAGNOSIS — Z7984 Long term (current) use of oral hypoglycemic drugs: Secondary | ICD-10-CM

## 2023-01-03 DIAGNOSIS — I5031 Acute diastolic (congestive) heart failure: Secondary | ICD-10-CM | POA: Diagnosis not present

## 2023-01-03 DIAGNOSIS — F32A Depression, unspecified: Secondary | ICD-10-CM | POA: Diagnosis present

## 2023-01-03 DIAGNOSIS — Z8249 Family history of ischemic heart disease and other diseases of the circulatory system: Secondary | ICD-10-CM

## 2023-01-03 DIAGNOSIS — Z91040 Latex allergy status: Secondary | ICD-10-CM

## 2023-01-03 HISTORY — DX: Acute on chronic diastolic (congestive) heart failure: I50.33

## 2023-01-03 HISTORY — DX: Chronic diastolic (congestive) heart failure: I50.32

## 2023-01-03 LAB — CBC WITH DIFFERENTIAL/PLATELET
Abs Immature Granulocytes: 0.2 10*3/uL — ABNORMAL HIGH (ref 0.00–0.07)
Basophils Absolute: 0.1 10*3/uL (ref 0.0–0.1)
Basophils Relative: 1 %
Eosinophils Absolute: 0.1 10*3/uL (ref 0.0–0.5)
Eosinophils Relative: 1 %
HCT: 36.9 % — ABNORMAL LOW (ref 39.0–52.0)
Hemoglobin: 11.9 g/dL — ABNORMAL LOW (ref 13.0–17.0)
Immature Granulocytes: 2 %
Lymphocytes Relative: 7 %
Lymphs Abs: 0.7 10*3/uL (ref 0.7–4.0)
MCH: 26.9 pg (ref 26.0–34.0)
MCHC: 32.2 g/dL (ref 30.0–36.0)
MCV: 83.3 fL (ref 80.0–100.0)
Monocytes Absolute: 1.4 10*3/uL — ABNORMAL HIGH (ref 0.1–1.0)
Monocytes Relative: 15 %
Neutro Abs: 6.9 10*3/uL (ref 1.7–7.7)
Neutrophils Relative %: 74 %
Platelets: 241 10*3/uL (ref 150–400)
RBC: 4.43 MIL/uL (ref 4.22–5.81)
RDW: 15.9 % — ABNORMAL HIGH (ref 11.5–15.5)
WBC: 9.3 10*3/uL (ref 4.0–10.5)
nRBC: 0 % (ref 0.0–0.2)

## 2023-01-03 LAB — BASIC METABOLIC PANEL
Anion gap: 8 (ref 5–15)
BUN: 31 mg/dL — ABNORMAL HIGH (ref 8–23)
CO2: 27 mmol/L (ref 22–32)
Calcium: 9.1 mg/dL (ref 8.9–10.3)
Chloride: 95 mmol/L — ABNORMAL LOW (ref 98–111)
Creatinine, Ser: 1.44 mg/dL — ABNORMAL HIGH (ref 0.61–1.24)
GFR, Estimated: 49 mL/min — ABNORMAL LOW (ref >60)
Glucose, Bld: 111 mg/dL — ABNORMAL HIGH (ref 70–99)
Potassium: 4.6 mmol/L (ref 3.5–5.1)
Sodium: 130 mmol/L — ABNORMAL LOW (ref 135–145)

## 2023-01-03 LAB — BRAIN NATRIURETIC PEPTIDE: B Natriuretic Peptide: 136.8 pg/mL — ABNORMAL HIGH (ref 0.0–100.0)

## 2023-01-03 LAB — PROCALCITONIN: Procalcitonin: 0.15 ng/mL

## 2023-01-03 LAB — TSH: TSH: 2.052 u[IU]/mL (ref 0.350–4.500)

## 2023-01-03 LAB — D-DIMER, QUANTITATIVE: D-Dimer, Quant: 1.08 ug{FEU}/mL — ABNORMAL HIGH (ref 0.00–0.50)

## 2023-01-03 MED ORDER — FUROSEMIDE 10 MG/ML IJ SOLN
40.0000 mg | Freq: Two times a day (BID) | INTRAMUSCULAR | Status: DC
  Administered 2023-01-04: 40 mg via INTRAVENOUS
  Filled 2023-01-03 (×2): qty 4

## 2023-01-03 MED ORDER — FENOFIBRATE 54 MG PO TABS
54.0000 mg | ORAL_TABLET | ORAL | Status: DC
  Administered 2023-01-04 – 2023-01-06 (×2): 54 mg via ORAL
  Filled 2023-01-03 (×2): qty 1

## 2023-01-03 MED ORDER — ISOSORBIDE MONONITRATE ER 30 MG PO TB24
30.0000 mg | ORAL_TABLET | Freq: Every day | ORAL | Status: DC
  Administered 2023-01-04 – 2023-01-06 (×3): 30 mg via ORAL
  Filled 2023-01-03 (×3): qty 1

## 2023-01-03 MED ORDER — ASPIRIN 81 MG PO TBEC
81.0000 mg | DELAYED_RELEASE_TABLET | Freq: Every day | ORAL | Status: DC
  Administered 2023-01-04 – 2023-01-06 (×3): 81 mg via ORAL
  Filled 2023-01-03 (×3): qty 1

## 2023-01-03 MED ORDER — MELATONIN 3 MG PO TABS
3.0000 mg | ORAL_TABLET | Freq: Every evening | ORAL | Status: DC | PRN
  Administered 2023-01-03 – 2023-01-04 (×2): 3 mg via ORAL
  Filled 2023-01-03 (×2): qty 1

## 2023-01-03 MED ORDER — AMLODIPINE BESYLATE 5 MG PO TABS
5.0000 mg | ORAL_TABLET | Freq: Every day | ORAL | Status: DC
  Administered 2023-01-04: 5 mg via ORAL
  Filled 2023-01-03: qty 1

## 2023-01-03 MED ORDER — CLOPIDOGREL BISULFATE 75 MG PO TABS
75.0000 mg | ORAL_TABLET | Freq: Every day | ORAL | Status: DC
  Administered 2023-01-04 – 2023-01-06 (×3): 75 mg via ORAL
  Filled 2023-01-03 (×3): qty 1

## 2023-01-03 MED ORDER — ACETAMINOPHEN 650 MG RE SUPP: 650.0000 mg | Freq: Four times a day (QID) | RECTAL | Status: DC | PRN

## 2023-01-03 MED ORDER — METOPROLOL TARTRATE 25 MG PO TABS
25.0000 mg | ORAL_TABLET | Freq: Two times a day (BID) | ORAL | Status: DC
  Administered 2023-01-04 – 2023-01-06 (×5): 25 mg via ORAL
  Filled 2023-01-03 (×5): qty 1

## 2023-01-03 MED ORDER — ONDANSETRON HCL 4 MG/2ML IJ SOLN: 4.0000 mg | Freq: Four times a day (QID) | INTRAMUSCULAR | Status: DC | PRN

## 2023-01-03 MED ORDER — FLUTICASONE PROPIONATE 50 MCG/ACT NA SUSP
1.0000 | Freq: Every day | NASAL | Status: DC
  Administered 2023-01-06: 1 via NASAL
  Filled 2023-01-03: qty 16

## 2023-01-03 MED ORDER — ACETAMINOPHEN 325 MG PO TABS
650.0000 mg | ORAL_TABLET | Freq: Four times a day (QID) | ORAL | Status: DC | PRN
  Administered 2023-01-03: 650 mg via ORAL
  Filled 2023-01-03: qty 2

## 2023-01-03 NOTE — Telephone Encounter (Signed)
Per Dr. Larinda Buttery-- [facility] will need direct [facility] for HFA and low sodium.  Spoke to [employer] with [facility] placement. She stated that Dr. Katrinka Blazing will contact Dr. Larinda Buttery to discuss patient care. [place] will reach to patient once bed is ready.

## 2023-01-03 NOTE — Telephone Encounter (Signed)
Patient's daughter called and said that Dr. Allyson Sabal recommended for patient to see a pulmonologist. York Spaniel that after seeing pulmonologist, he suggested that patient go to hospital and that he's developping a little heart and kidney failure. Patient is reluctant to go to hospital and would like to get Dr. Hazle Coca opinion on it before he goes to it

## 2023-01-03 NOTE — Plan of Care (Signed)
Direct admission requested by Dr. Larinda Buttery of Pulmonology Jeffrey Acevedo is a 82 year old male with pmh HTN, hyperlipidemia, CAD s/p prior stenting to LAD, prostate cancer who presented to the pulmonology office with increasing dyspnea.  Just recently hospitalized 10/11-10/12 for unstable angina and underwent stenting of the proximal to mid LAD.  Patient had been discharged home on 3 L of oxygen.  In the pulmonology clinic patient was noted to have increased work of breathing still on 3 L of oxygen.  Labs were obtained noting BNP 130 increased from priors, sodium 138, creatinine 1.4 (baseline creatinine previously noted to be around 1).  Patient had been on 40 mg of Lasix which had been increased several days ago.  He was also noted to have increased right leg swelling more so than left leg and was sent for Doppler study which was negative.  Patient requested due to concern for congestive heart failure exacerbation in need of IV diuresis.  Echocardiogram with bubble study recommended to be obtained by pulmonology.

## 2023-01-03 NOTE — Telephone Encounter (Signed)
Returned call to pt and family. Sent message to Dr. Allyson Sabal and his nurse. To get recommendations.  Per Dr. Allyson Sabal: If he still having shortness of breath I think this is good idea. He recently had a coronary stent and if this did not improve his symptoms then I think he should go to the hospital if pulmonary thinks it is appropriate.  Relied message to pt and family. Pt will go to the hospital.

## 2023-01-03 NOTE — H&P (Addendum)
History and Physical      Jeffrey Acevedo VQQ:[id number] DOB: May 01, 1940 DOA: 01/03/2023; DOS: 01/03/2023  PCP: [name], MD  Patient coming from: home   I have personally briefly reviewed patient's old medical records in [facility]  Chief Complaint: Shortness of breath  HPI: Jeffrey Acevedo is a 82 y.o. male with medical history significant for coronary disease status post stent to the LAD, essential hypertension, hyperlipidemia, chronic hypoxic respiratory failure on continuous 3 L nasal cannula, chronic diastolic heart failure, allergic rhinitis, who is admitted to [facility] on 01/03/2023 by way of direct admission from pulmonology clinic with suspected acute on chronic diastolic heart failure after presenting from home to pulmonology clinic complaining of shortness of breath.   The patient reports to 3 days of progressive shortness of breath in the absence of any associated chest pain, palpitations, diaphoresis, nausea, vomiting, dizziness, presyncope, or syncope.  Denies any new onset cough, nor any associated subjective fever, chills, rigors, or generalized myalgias.  He does however note associated increase in swelling in the bilateral lower extremities, right greater than left over the above timeframe.  Not associate any new lower extremity erythema or calf tenderness.  Denies any recent mopped assist.  Has a documented history of chronic diastolic heart failure, with most recent prior echocardiogram performed on 09/05/2021, which showed LVEF 60 to 65%, no evidence of focal motion or maladies, indeterminate diastolic parameters, normal right ventricular systolic function, and trivial aortic regurgitation.  Prior to this, he had undergone echocardiogram in January 2023, which was notable for grade 1 diastolic dysfunction.  He is on Lasix 40 mg p.o. daily as an outpatient, and had increased the dose relative to this over the course the last 2 days, but is noted no  significant ensuing improvement in his shortness of breath following this measure.  He was recently hospitalized at [facility] from 12/29/2022 to 12/30/2022 for unstable angina, during which he underwent PCI with stent to the proximal/mid LAD on 12/29/2022.  He was subsidy discharged home on 12/30/2022 on 3 L nasal cannula, upon which she has subsequently remained.  Per chart review, most recent prior serum sodium levels were noted to be as follows: 136 on 12/13/2022, 134 on 12/29/2022, 132 on 12/30/2022.  In the setting of the above, he had presented to the office of his pulmonologist earlier today, Dr.Assaker, who noted the patient to exhibit evidence of increased work of breathing.  Dr.Assaker subsequently requested and was granted direct admission of this patient for further evaluation management of suspected acute on chronic diastolic heart failure, with associated pulmonology recommendation for pursuit of updated echocardiogram.  Labs that were performed on an outpatient basis at the office of pulmonology clinic earlier today were notable for the following: BMP was notable for the following: Sodium 130, potassium 4.6, bicarbonate 27, creatinine 1.44 compared to most recent prior serum creatinine did point of 1.2 on 12/30/2022, glucose 111.  BNP 136.8 compared to 78 on 12/13/2022.  D-dimer 1.08.  CBC notable for white cell count 9300, hemoglobin 11.9 associated normocytic/Norocarp properties and relative to most recent prior hemoglobin value of 12.2 on 12/30/2022, platelet count 241 compared to 215 on 12/30/2022.  Additionally, as an outpatient today, he underwent two-view chest x-ray, which showed minimal bibasilar atelectasis, without evidence of superimposed acute cardiopulmonary process, including no evidence of infiltrate, edema, effusion, or pneumothorax.  Additionally, he underwent venous ultrasound of the bilateral lower extremities, which showed no evidence of DVT.  Review of Systems: As  per HPI otherwise 10 point review of systems negative.   Past Medical History:  Diagnosis Date   Cancer [facility])    prostate cancer surgery 2011   Coronary artery disease    sees Dr. Erlene Quan 3 mths ago.  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   Hyperlipidemia    Hypertension     Past Surgical History:  Procedure Laterality Date   CARDIAC CATHETERIZATION  06/17/2009   Proximal LAD 90% stenosis just beyond previously stented segment with a Multi-[employer] Vision bare-metal stent   CORONARY ANGIOPLASTY     stents placed in 1998 and 2011    CORONARY PRESSURE/FFR STUDY N/A 05/02/2021   Procedure: INTRAVASCULAR PRESSURE WIRE/FFR STUDY;  Surgeon: Runell Gess, MD;  Location: [facility];  Service: Cardiovascular;  Laterality: N/A;   CORONARY PRESSURE/FFR STUDY N/A 09/19/2021   Procedure: INTRAVASCULAR PRESSURE WIRE/FFR STUDY;  Surgeon: Runell Gess, MD;  Location: [facility];  Service: Cardiovascular;  Laterality: N/A;  RAMUS   CORONARY STENT INTERVENTION N/A 05/02/2021   Procedure: CORONARY STENT INTERVENTION;  Surgeon: Runell Gess, MD;  Location: [facility];  Service: Cardiovascular;  Laterality: N/A;   CORONARY STENT INTERVENTION N/A 09/19/2021   Procedure: CORONARY STENT INTERVENTION;  Surgeon: Runell Gess, MD;  Location: [facility];  Service: Cardiovascular;  Laterality: N/A;   CORONARY STENT INTERVENTION N/A 12/29/2022   Procedure: CORONARY STENT INTERVENTION;  Surgeon: Kathleene Hazel, MD;  Location: [facility];  Service: Cardiovascular;  Laterality: N/A;   LEFT HEART CATH AND CORONARY ANGIOGRAPHY N/A 05/02/2021   Procedure: LEFT HEART CATH AND CORONARY ANGIOGRAPHY;  Surgeon: Runell Gess, MD;  Location: [facility];  Service: Cardiovascular;  Laterality: N/A;   LEFT HEART CATH AND CORONARY ANGIOGRAPHY N/A  09/19/2021   Procedure: LEFT HEART CATH AND CORONARY ANGIOGRAPHY;  Surgeon: Runell Gess, MD;  Location: [facility];  Service: Cardiovascular;  Laterality: N/A;   NASAL SEPTUM SURGERY     Dr. Richardson Landry @ [facility]  2014   [facility]  2011   RIGHT/LEFT HEART CATH AND CORONARY ANGIOGRAPHY N/A 12/29/2022   Procedure: RIGHT/LEFT HEART CATH AND CORONARY ANGIOGRAPHY;  Surgeon: Kathleene Hazel, MD;  Location: [facility];  Service: Cardiovascular;  Laterality: N/A;   ROBOT ASSISTED LAPAROSCOPIC RADICAL PROSTATECTOMY     TOTAL HIP ARTHROPLASTY Right 04/19/2015   Procedure: RIGHT TOTAL HIP ARTHROPLASTY ANTERIOR APPROACH;  Surgeon: Jodi Geralds, MD;  Location: [facility] OR;  Service: Orthopedics;  Laterality: Right;    Social History:  reports that he has never smoked. He has never used smokeless tobacco. He reports that he does not drink alcohol and does not use drugs.   Allergies  Allergen Reactions   Adhesive [Tape] Other (See Comments)    Mainly just redness.   Has been ruled out for latex allegery   Hydrochlorothiazide Other (See Comments)    H/o Pancreatitis, takes in Avalide at home   Latex Other (See Comments)    unknown   Vantin [Cefpodoxime] Rash    Family History  Problem Relation Age of Onset   Hypertension Mother    Heart disease Father    Hypertension Father     Family history reviewed and not  pertinent    Prior to Admission medications   Medication Sig Start Date End Date Taking? Authorizing Provider  amLODipine (NORVASC) 5 MG tablet Take 1 tablet (5 mg total) by mouth daily. 11/14/22   Runell Gess, MD  aspirin EC 81 MG EC tablet Take 1 tablet (81 mg total) by mouth daily. Swallow whole. 05/04/21   Arty Baumgartner, NP  Choline Fenofibrate (FENOFIBRIC ACID) 45 MG CPDR Take 1 capsule by mouth once daily Patient taking differently: Take 45 mg by mouth every other day. 11/02/22   Runell Gess, MD  clopidogrel (PLAVIX) 75 MG tablet  Take 1 tablet (75 mg total) by mouth daily. 12/30/22   Azalee Course, PA  Cyanocobalamin (VITAMIN B-12 PO) Take 2,500 mcg by mouth daily.    [provider]  fluticasone (FLONASE) 50 MCG/ACT nasal spray Place 1 spray into both nostrils daily.    [provider]  furosemide (LASIX) 40 MG tablet Take 1 tablet (40 mg total) by mouth daily. 12/13/22 03/13/23  Marcelino Duster, PA  isosorbide mononitrate (IMDUR) 30 MG 24 hr tablet Take 30 mg by mouth daily. 12/11/22   [provider]  Magnesium Citrate 200 MG TABS Take 400 mg by mouth daily. 12/13/22   [facility], Roe Rutherford, PA  metoprolol tartrate (LOPRESSOR) 25 MG tablet Take 1 tablet (25 mg total) by mouth 2 (two) times daily. 12/30/22   Azalee Course, PA  nitroGLYCERIN (NITROSTAT) 0.4 MG SL tablet Place 1 tablet (0.4 mg total) under the tongue every 5 (five) minutes as needed for chest pain. 11/14/22   Runell Gess, MD  Omega-3 Fatty Acids (FISH OIL) 1000 MG CAPS Take 1,000 mg by mouth every other day. In the evening, alternating with fenofibrate acid    [provider]  oxymetazoline (AFRIN) 0.05 % nasal spray Place 1 spray into both nostrils 2 (two) times daily as needed for congestion. Patient not taking: Reported on 01/03/2023    [provider]  sertraline (ZOLOFT) 25 MG tablet Take 25 mg by mouth daily. 12/19/22 12/19/23  [provider]     Objective    Physical Exam: Vitals:   01/03/23 1909  BP: 135/60  Pulse: 69  Resp: 20  Temp: 97.6 F (36.4 C)  TempSrc: Oral  SpO2: 95%    General: appears to be stated age; alert, oriented; mildly increased work of breathing noted Skin: warm, dry, no rash Head:  AT/[place] Mouth:  Oral mucosa membranes appear moist, normal dentition Neck: supple; trachea midline Heart:  RRR; did not appreciate any M/R/G Lungs: CTAB, did not appreciate any wheezes, rales, or rhonchi Abdomen: + BS; soft, ND, NT Vascular: 2+ pedal pulses b/l; 2+ radial pulses  b/l Extremities: 2+ edema in the bilateral lower extremities, right greater than left, no muscle wasting Neuro: strength and sensation intact in upper and lower extremities b/l    Labs on Admission: I have personally reviewed following labs and imaging studies  CBC: Recent Labs  Lab 12/29/22 1234 12/29/22 1235 12/30/22 0727 01/03/23 1255  WBC  --   --  8.0 9.3  NEUTROABS  --   --   --  6.9  HGB 11.9* 11.2* 12.2* 11.9*  HCT 35.0* 33.0* 37.3* 36.9*  MCV  --   --  84.0 83.3  PLT  --   --  215 241   Basic Metabolic Panel: Recent Labs  Lab 12/29/22 1234 12/29/22 1235 12/30/22 0727 01/03/23 1255  NA 134* 134* 132* 130*  K  4.3 4.1 4.3 4.6  CL  --   --  100 95*  CO2  --   --  25 27  GLUCOSE  --   --  111* 111*  BUN  --   --  18 31*  CREATININE  --   --  1.20 1.44*  CALCIUM  --   --  8.8* 9.1   GFR: Estimated Creatinine Clearance: 47.9 mL/min (A) (by C-G formula based on SCr of 1.44 mg/dL (H)). Liver Function Tests: No results for input(s): "AST", "ALT", "ALKPHOS", "BILITOT", "PROT", "ALBUMIN" in the last 168 hours. No results for input(s): "LIPASE", "AMYLASE" in the last 168 hours. No results for input(s): "AMMONIA" in the last 168 hours. Coagulation Profile: No results for input(s): "INR", "PROTIME" in the last 168 hours. Cardiac Enzymes: No results for input(s): "CKTOTAL", "CKMB", "CKMBINDEX", "TROPONINI" in the last 168 hours. BNP (last 3 results) No results for input(s): "PROBNP" in the last 8760 hours. HbA1C: No results for input(s): "HGBA1C" in the last 72 hours. CBG: Recent Labs  Lab 12/29/22 1620 12/30/22 0743 12/30/22 1146  GLUCAP 167* 124* 122*   Lipid Profile: No results for input(s): "CHOL", "HDL", "LDLCALC", "TRIG", "CHOLHDL", "LDLDIRECT" in the last 72 hours. Thyroid Function Tests: No results for input(s): "TSH", "T4TOTAL", "FREET4", "T3FREE", "THYROIDAB" in the last 72 hours. Anemia Panel: No results for input(s): "VITAMINB12", "FOLATE",  "FERRITIN", "TIBC", "IRON", "RETICCTPCT" in the last 72 hours. Urine analysis:    Component Value Date/Time   COLORURINE YELLOW 04/14/2015 1151   APPEARANCEUR CLEAR 04/14/2015 1151   LABSPEC 1.014 04/14/2015 1151   PHURINE 5.5 04/14/2015 1151   GLUCOSEU NEGATIVE 04/14/2015 1151   HGBUR TRACE (A) 04/14/2015 1151   BILIRUBINUR NEGATIVE 04/14/2015 1151   KETONESUR NEGATIVE 04/14/2015 1151   PROTEINUR NEGATIVE 04/14/2015 1151   NITRITE NEGATIVE 04/14/2015 1151   LEUKOCYTESUR NEGATIVE 04/14/2015 1151    Radiological Exams on Admission: DG Chest 2 View  Result Date: 01/03/2023 CLINICAL DATA:  Shortness of breath. EXAM: CHEST - 2 VIEW COMPARISON:  04/26/2021; 04/14/2015 FINDINGS: Grossly unchanged cardiac silhouette and mediastinal contours with atherosclerotic plaque within the thoracic aorta. Minimal bibasilar heterogeneous opacities favored to represent atelectasis. No pleural effusion or pneumothorax. No evidence of edema. No acute osseous abnormalities. IMPRESSION: Minimal bibasilar atelectasis without superimposed acute cardiopulmonary disease. Electronically Signed   By: Simonne Come M.D.   On: 01/03/2023 12:49   US Venous Img Lower Bilateral (DVT)  Result Date: 01/03/2023 CLINICAL DATA:  Right lower extremity swelling. Shortness of breath. History of prostate cancer. Evaluate for DVT. EXAM: BILATERAL LOWER EXTREMITY VENOUS DOPPLER ULTRASOUND TECHNIQUE: Gray-scale sonography with graded compression, as well as color Doppler and duplex ultrasound were performed to evaluate the lower extremity deep venous systems from the level of the common femoral vein and including the common femoral, femoral, profunda femoral, popliteal and calf veins including the posterior tibial, peroneal and gastrocnemius veins when visible. The superficial great saphenous vein was also interrogated. Spectral Doppler was utilized to evaluate flow at rest and with distal augmentation maneuvers in the common femoral,  femoral and popliteal veins. COMPARISON:  None Available. FINDINGS: RIGHT LOWER EXTREMITY Common Femoral Vein: No evidence of thrombus. Normal compressibility, respiratory phasicity and response to augmentation. Saphenofemoral Junction: No evidence of thrombus. Normal compressibility and flow on color Doppler imaging. Profunda Femoral Vein: No evidence of thrombus. Normal compressibility and flow on color Doppler imaging. Femoral Vein: No evidence of thrombus. Normal compressibility, respiratory phasicity and response to augmentation. Popliteal Vein: No evidence  of thrombus. Normal compressibility, respiratory phasicity and response to augmentation. Calf Veins: No evidence of thrombus. Normal compressibility and flow on color Doppler imaging. Superficial Great Saphenous Vein: No evidence of thrombus. Normal compressibility. Other Findings: There is a minimal amount of subcutaneous edema at the level of the right lower leg and calf. LEFT LOWER EXTREMITY Common Femoral Vein: No evidence of thrombus. Normal compressibility, respiratory phasicity and response to augmentation. Saphenofemoral Junction: No evidence of thrombus. Normal compressibility and flow on color Doppler imaging. Profunda Femoral Vein: No evidence of thrombus. Normal compressibility and flow on color Doppler imaging. Femoral Vein: No evidence of thrombus. Normal compressibility, respiratory phasicity and response to augmentation. Popliteal Vein: No evidence of thrombus. Normal compressibility, respiratory phasicity and response to augmentation. Calf Veins: No evidence of thrombus. Normal compressibility and flow on color Doppler imaging. Superficial Great Saphenous Vein: No evidence of thrombus. Normal compressibility. Other Findings: There is a minimal amount of subcutaneous edema at the level of the left lower leg and calf. IMPRESSION: No evidence of DVT within either lower extremity. Electronically Signed   By: Simonne Come M.D.   On: 01/03/2023  12:46      Assessment/Plan   Principal Problem:   Acute on chronic diastolic CHF (congestive heart failure) (HCC) Active Problems:   Essential hypertension   Hyperlipidemia   CAD S/P percutaneous coronary angioplasty   Chronic hypoxic respiratory failure (HCC)   Acute hyponatremia   Allergic rhinitis      #) Acute on chronic diastolic heart failure: dx of acute decompensation on the basis of presenting 2 to 3 days of progressive shortness of breath associated with worsening edema in the bilateral lower extremities, with BNP elevated to nearly twice preceding value. This is in the context of a known history of chronic diastolic heart failure, with most recent echocardiogram performed in June 2023, which is notable for LVEF 60 to 65%, indeterminate diastolic parameters, with preceding echocardiogram in January 2023 notable for grade 1 diastolic dysfunction. Etiology leading to presenting acutely decompensated heart failure is currently unclear. Patient conveys good compliance with home diuretic therapy, which consists of Lasix 40 mg p.o. daily, while noting further progression in his symptoms and spite of increase in dose relative to this over the last 2 days, as further detailed above. Consequently, it appears that the patient would benefit from IV diuresis.   Overall, ACS leading to presenting acutely decompensated heart failure appears less likely at will pursue updated this time in the absence of any recent CP.  EKG at this time.  It is noted that the patient was recently hospitalized for unstable angina, undergoing PCI with stent placement to the proximal/mid LAD on 12/29/2022, with good interval compliance on his dual antiplatelet therapy via daily baby aspirin as well as Plavix, as further detailed above.  Presentation warrants additional IV diuresis, as further detailed below, with close monitoring of ensuing renal function, electrolytes, and volume status, as further noted below.  As  patient is already on a BB at home, will plan to continue this.   In terms of consideration towards other possibilities that could potentially be contributing to his presenting shortness of breath, his elevated D-dimer is noted.  In the setting of recent surgical procedure, will pursue CTA chest to further evaluate for any contribution from acute pulmonary embolism.  Will also add on procalcitonin level to further assess for plain film of the chest, which showed evidence of bibasilar atelectasis.   Plan: monitor strict I's & O's and  daily weights. Monitor on telemetry. Monitor continuous pulse oximetry. CMP in the morning, including for monitoring trend of potassium, bicarbonate, and renal function in response to interval diuresis efforts. Check serum magnesium level. Close monitoring of ensuing blood pressure response to diuresis efforts, including to help guide need for improvement in afterload reduction in order to optimize cardiac output. Lasix 40 mg IV twice daily, first dose now. Continue outpatient BB.  Echocardiogram ordered for the morning.  In send spirometry.  CTA chest.  Resume home Imdur, Norvasc for afterload reduction.  Check EKG now.  Repeat BMP in the morning.  Add on procalcitonin level, as further detailed above.                   #) Acute on subacute hypo-osmolar hypervolemic hyponatremia: Relative to his baseline serum sodium level, which appears to be within normal limits, the patient has recently developed evidence of progressive hyponatremia, with today's serum sodium level now indicative of further depression, as further quantified above, which appears consistent with acute on subacute hyponatremia. Suspect an element of hypervolemia in the context of his presenting acute on chronic diastolic heart failure, as further detailed above.  Differential also includes the possibility of SIADH, rendered more likely in the setting of recent postoperative status.  Of note, will  also follow closely for result of CTA chest, ordered as component of evaluation for shortness of breath and elevated D-dimer, for any additional contributory pulmonary processes.  Differential also includes the possibility of pharmacologic contribution from outpatient use of Zoloft. Of note, no evidence of associated acute focal neurologic deficits and no report of recent trauma.  Will proceed with additional diagnostic evaluation, as further detailed below, as well as pursuit of IV diuresis, as further detailed below.  Plan: monitor strict I's and O's and daily weights.  check UA, random urine sodium, urine osmolality.  Check serum osmolality to confirm suspected hypoosmolar etiology.  Repeat CMP in the morning. Check TSH. Check serum uric acid level, as SIADH can be associated with hypouricemia due to hyperuricuria.  Follow for result of CTA chest, as above.  Repeat BMP in the morning.  Add on procalcitonin level.  IV diuresis via Lasix, as above.  Hold home Zoloft for now.             #) History of CAD: Status post PCI with stent placement to the proximal/mid LAD on 12/29/2022, as further detailed above.  No evidence of ACS at this time, including no evidence of chest pain at time of presentation.  Will check updated EKG at this time.  In light of recent stent placement, the patient is currently on dual antiplatelet therapy with daily baby aspirin as well as Plavix.  Additional outpatient cardiac medications include Lopressor.   Plan: Continue outpatient dual antiplatelet therapy, as above.  Continue outpatient Lopressor.  Monitor strict I's and O's Daily weights.  Follow for result of updated echocardiogram, which has been ordered for tomorrow morning, as further detailed above.  Monitor on telemetry.  Check serum magnesium level.               #) Essential Hypertension: documented h/o such, with outpatient antihypertensive regimen including Imdur, amlodipine, Lopressor, Lasix.   Presenting systolic blood pressure upon arrival at [facility] today noted to be in the 130s  mmHg. will resume home antihypertensive medications at this time, with the exception of oral Lasix, given the plan for initiation of IV Lasix in the context of presenting acute  on chronic diastolic heart failure.  Plan: Close monitoring of subsequent BP via routine VS. resume home Imdur, Norvasc, Lopressor.  Hold home oral Lasix and lieu of initiation of IV Lasix, as quantified above.  Monitor strict I's and O's and daily weights.                   #) Hyperlipidemia: documented h/o such. On fenofibrate as outpatient.   Plan: continue home fenofibrate.                 #) Chronic hypoxic respiratory failure: The patient was noted to be discharged from the hospital on 12/30/2022 on 3 L continuous nasal cannula, upon which he remains, with associated presenting oxygen saturations in the mid 90s.  Suspect that this is multifactorial in nature, including contributions from his history of chronic diastolic heart failure.  Presenting chest x-ray shows evidence of bibasilar atelectasis, without any evidence of additional acute cardiopulmonary process.  However, in the setting of recent surgical procedure and elevated D-dimer, will pursue CTA chest with pulmonary embolism protocol to further evaluate for any additional contributing factors.  Plan: Follow-up result of CTA chest, as above.  Check serum magnesium and phosphorus levels.  Further evaluation management of presenting acute on chronic diastolic heart failure, as above.  Monitor strict I's and O's and daily weights.  Incentive spirometry.                  #) Allergic Rhinitis: documented h/o such, on scheduled intranasal Flonase as outpatient.   Plan: cont home Flonase.        DVT prophylaxis: SCD's   Code Status: Full code Family Communication: none Disposition Plan: Per Rounding Team Consults called:  none;  Admission status: inpatient     I SPENT GREATER THAN 75  MINUTES IN CLINICAL CARE TIME/MEDICAL DECISION-MAKING IN COMPLETING THIS ADMISSION.      Chaney Born Raisa Ditto DO [facility]  From 7PM - 7AM   01/03/2023, 8:34 PM

## 2023-01-03 NOTE — Progress Notes (Signed)
Synopsis: Referred in by [name], MD   Subjective:   PATIENT ID: Jeffrey Acevedo GENDER: male DOB: 1940/06/13, MRN: [id number]  Chief Complaint  Patient presents with   pulmonary consult    SOB with exertion and prod cough with clear sputum.    HPI Jeffrey Acevedo is an 82 year old pleasant 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  His history goes back to September of this year when he presented to [facility] for acute hypoxic respiratory failure.  Chest x-ray at the time showed atelectasis versus aspiration CTA of the chest did not show any large PE but did show enlarged PA trunk which raises the concern for pulmonary hypertension.  He was seen and assessed by pulmonology at [facility] who recommended diuresis and outpatient workup for pulmonary hypertension.  He was also seen and evaluated by cardiology for his long history of CAD and antianginal therapy was maximized at the time and he was referred to see his outpatient cardiologist.  He saw his cardiologist Jeffrey Acevedo on 10/09 and given patient prior history of negative stress test and although his stress test was negative at [facility] it was decided for him to undergo an elective right and left heart cath.  The latter was done on 10/11 which showed patent proximal to mid LAD stent and patent mid LAD stent.  However it did show a 90% mid LAD lesion that was thought to be the culprit and therefore drug is revealing stent was placed.  He reports that post stent his pain subsided however he still has shortness of breath and hypoxia off oxygen.  He was on 3 L nasal cannula when I saw him and noted to have slightly increased work of breathing  specifically when talking.  He appeared fluid overloaded on exam with increased lower extremity edema and possible JVD.  He did tell me that they increased his Lasix to 40 mg p.o. daily earlier in October and was discharged on that.  I sent him for repeat labs which resulted with an elevated proBNP of 136.8 which previously was 78.  Worsening hyponatremia with sodium of 130 and worsening kidney function with a creatinine of 1.44 with a baseline of 1.1 and a BUN of 31.  At that point I requested the patient to be admitted to [facility] for further evaluation of decompensated heart failure possibly requiring IV diuresis.  Ultrasound of the right lower extremity was also done which was negative for any DVTs.  [facility] showed normal LVEF at the time of 60% and no valvular disease.  Family history -daughter with asthma and second daughter with rheumatoid arthritis  Social history -never smoker, denies alcohol use.  He is a retired Therapist, music.  ROS All systems were reviewed and are negative except for the above. Objective:   Vitals:   01/03/23 0849  BP: 120/60  Pulse: (!) 59  Temp: 97.6 F (36.4 C)  TempSrc: Temporal  SpO2: 94%  Weight: 207 lb 9.6 oz (94.2 kg)  Height: 6' (1.829 m)   94% on 3 LPM  BMI Readings from Last 3 Encounters:  01/03/23 28.16 kg/m  12/29/22 26.85 kg/m  12/27/22  28.62 kg/m   Wt Readings from Last 3 Encounters:  01/03/23 207 lb 9.6 oz (94.2 kg)  12/29/22 198 lb (89.8 kg)  12/27/22 205 lb 3.2 oz (93.1 kg)   Physical Exam GEN: NAD HEENT: Supple Neck, Reactive Pupils, EOMI  CVS: Normal S1, Normal S2, RRR, systolic murmur heard best at the right sternal border Lungs: Bibasilar crackles noted.  Abdomen: Soft, non tender, non distended, + BS  Extremities: +3 lower extremity edema.  Right lower extremity significantly worse than the left. Skin: No suspicious lesions appreciated  Psych: Normal Affect  Ancillary Information   CBC     Component Value Date/Time   WBC 8.0 12/30/2022 0727   RBC 4.44 12/30/2022 0727   HGB 12.2 (L) 12/30/2022 0727   HGB 15.1 09/16/2021 1038   HCT 37.3 (L) 12/30/2022 0727   HCT 45.8 09/16/2021 1038   PLT 215 12/30/2022 0727   PLT 216 09/16/2021 1038   MCV 84.0 12/30/2022 0727   MCV 85 09/16/2021 1038   MCH 27.5 12/30/2022 0727   MCHC 32.7 12/30/2022 0727   RDW 15.8 (H) 12/30/2022 0727   RDW 14.5 09/16/2021 1038   LYMPHSABS 1.6 04/14/2015 1152   MONOABS 1.0 04/14/2015 1152   EOSABS 0.4 04/14/2015 1152   BASOSABS 0.1 04/14/2015 1152    Imaging       No data to display           Assessment & Plan:  Jeffrey Acevedo is an 82 year old pleasant 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  # Subacute hypoxic respiratory failure requiring 3 L nasal cannula likely multifactorial # In the setting of deconditioning with bibasilar atelectasis in the setting of CAD with recent PCI and possibly decompensated heart failure with elevated proBNP bilateral lower extremity edema. # Other diagnosis can include cardiopulmonary shunt  # PE is also on the differential with elevated D-dimer however this can be difficult to interpret given AKI.  He had [facility] in [facility] that did not show any PE. #CAD s/p  PCI to the proximal LAD in January 1998 again to the mid LAD in 2011 and most recently in 2024 and mid LAD  []  Admission to [facility] for further evaluation of decompensated heart failure and further management of fluid status. []  Consider TTE with bubble study to evaluate for cardiopulmonary shunt. []  Consider VQ scan to assess for possible PE given patient's elevated D-dimer at 1.08.   #High Risk for  OSA  Patient with long standing lound snoring and possible Apneic episodes per his wife. He does fall asleep easily when sitting watching TV.   []  In-lab PSG once patient optimized from a cardiac/resp stand point.   Return in about 3 months (around 04/05/2023).  I spent 60 minutes caring for this patient today, including preparing to see the patient, obtaining a medical history , reviewing a separately obtained history, performing a medically appropriate examination and/or evaluation, counseling and educating the patient/family/caregiver, ordering medications, tests, or procedures, referring and communicating with other health care professionals (not separately reported), documenting clinical information in the electronic health record, and independently interpreting results (not separately reported/billed) and communicating results to the patient/family/caregiver  Janann Colonel, MD [facility] 01/03/2023 12:50 PM

## 2023-01-04 ENCOUNTER — Inpatient Hospital Stay (HOSPITAL_COMMUNITY): Payer: Medicare PPO

## 2023-01-04 DIAGNOSIS — I10 Essential (primary) hypertension: Secondary | ICD-10-CM | POA: Diagnosis not present

## 2023-01-04 DIAGNOSIS — I5031 Acute diastolic (congestive) heart failure: Secondary | ICD-10-CM | POA: Diagnosis not present

## 2023-01-04 DIAGNOSIS — N179 Acute kidney failure, unspecified: Secondary | ICD-10-CM | POA: Diagnosis not present

## 2023-01-04 DIAGNOSIS — I5033 Acute on chronic diastolic (congestive) heart failure: Secondary | ICD-10-CM | POA: Diagnosis not present

## 2023-01-04 DIAGNOSIS — I2510 Atherosclerotic heart disease of native coronary artery without angina pectoris: Secondary | ICD-10-CM | POA: Diagnosis not present

## 2023-01-04 DIAGNOSIS — N189 Chronic kidney disease, unspecified: Secondary | ICD-10-CM

## 2023-01-04 LAB — URINALYSIS, COMPLETE (UACMP) WITH MICROSCOPIC
Bilirubin Urine: NEGATIVE
Glucose, UA: NEGATIVE mg/dL
Ketones, ur: NEGATIVE mg/dL
Leukocytes,Ua: NEGATIVE
Nitrite: NEGATIVE
Protein, ur: NEGATIVE mg/dL
Specific Gravity, Urine: 1.01 (ref 1.005–1.030)
pH: 5 (ref 5.0–8.0)

## 2023-01-04 LAB — MAGNESIUM: Magnesium: 2.1 mg/dL (ref 1.7–2.4)

## 2023-01-04 LAB — COMPREHENSIVE METABOLIC PANEL
ALT: 16 U/L (ref 0–44)
AST: 22 U/L (ref 15–41)
Albumin: 2.4 g/dL — ABNORMAL LOW (ref 3.5–5.0)
Alkaline Phosphatase: 32 U/L — ABNORMAL LOW (ref 38–126)
Anion gap: 13 (ref 5–15)
BUN: 32 mg/dL — ABNORMAL HIGH (ref 8–23)
CO2: 23 mmol/L (ref 22–32)
Calcium: 8.8 mg/dL — ABNORMAL LOW (ref 8.9–10.3)
Chloride: 96 mmol/L — ABNORMAL LOW (ref 98–111)
Creatinine, Ser: 1.52 mg/dL — ABNORMAL HIGH (ref 0.61–1.24)
GFR, Estimated: 46 mL/min — ABNORMAL LOW (ref >60)
Glucose, Bld: 104 mg/dL — ABNORMAL HIGH (ref 70–99)
Potassium: 4.1 mmol/L (ref 3.5–5.1)
Sodium: 132 mmol/L — ABNORMAL LOW (ref 135–145)
Total Bilirubin: 0.6 mg/dL (ref 0.3–1.2)
Total Protein: 4.8 g/dL — ABNORMAL LOW (ref 6.5–8.1)

## 2023-01-04 LAB — CBC WITH DIFFERENTIAL/PLATELET
Abs Immature Granulocytes: 0.17 10*3/uL — ABNORMAL HIGH (ref 0.00–0.07)
Basophils Absolute: 0.1 10*3/uL (ref 0.0–0.1)
Basophils Relative: 1 %
Eosinophils Absolute: 0.2 10*3/uL (ref 0.0–0.5)
Eosinophils Relative: 3 %
HCT: 31.8 % — ABNORMAL LOW (ref 39.0–52.0)
Hemoglobin: 10.4 g/dL — ABNORMAL LOW (ref 13.0–17.0)
Immature Granulocytes: 2 %
Lymphocytes Relative: 13 %
Lymphs Abs: 0.9 10*3/uL (ref 0.7–4.0)
MCH: 27.7 pg (ref 26.0–34.0)
MCHC: 32.7 g/dL (ref 30.0–36.0)
MCV: 84.6 fL (ref 80.0–100.0)
Monocytes Absolute: 1.5 10*3/uL — ABNORMAL HIGH (ref 0.1–1.0)
Monocytes Relative: 20 %
Neutro Abs: 4.5 10*3/uL (ref 1.7–7.7)
Neutrophils Relative %: 61 %
Platelets: 206 10*3/uL (ref 150–400)
RBC: 3.76 MIL/uL — ABNORMAL LOW (ref 4.22–5.81)
RDW: 15.9 % — ABNORMAL HIGH (ref 11.5–15.5)
WBC: 7.3 10*3/uL (ref 4.0–10.5)
nRBC: 0 % (ref 0.0–0.2)

## 2023-01-04 LAB — ECHOCARDIOGRAM COMPLETE
Area-P 1/2: 2.26 cm2
Height: 72 in
MV M vel: 3.31 m/s
MV Peak grad: 43.8 mm[Hg]
S' Lateral: 2.7 cm
Weight: 3319.25 [oz_av]

## 2023-01-04 LAB — URIC ACID: Uric Acid, Serum: 6.3 mg/dL (ref 3.7–8.6)

## 2023-01-04 LAB — BRAIN NATRIURETIC PEPTIDE: B Natriuretic Peptide: 104.6 pg/mL — ABNORMAL HIGH (ref 0.0–100.0)

## 2023-01-04 LAB — OSMOLALITY, URINE: Osmolality, Ur: 448 mosm/kg (ref 300–900)

## 2023-01-04 LAB — OSMOLALITY: Osmolality: 294 mosm/kg (ref 275–295)

## 2023-01-04 LAB — CREATININE, URINE, RANDOM: Creatinine, Urine: 62 mg/dL

## 2023-01-04 LAB — SODIUM, URINE, RANDOM: Sodium, Ur: 110 mmol/L

## 2023-01-04 LAB — PHOSPHORUS: Phosphorus: 4.4 mg/dL (ref 2.5–4.6)

## 2023-01-04 MED ORDER — FUROSEMIDE 40 MG PO TABS: 40.0000 mg | ORAL_TABLET | Freq: Every day | ORAL | Status: DC

## 2023-01-04 MED ORDER — IOHEXOL 350 MG/ML SOLN
75.0000 mL | Freq: Once | INTRAVENOUS | Status: AC | PRN
  Administered 2023-01-04: 75 mL via INTRAVENOUS

## 2023-01-04 MED ORDER — EMPAGLIFLOZIN 10 MG PO TABS
10.0000 mg | ORAL_TABLET | Freq: Every day | ORAL | Status: DC
  Administered 2023-01-04 – 2023-01-06 (×3): 10 mg via ORAL
  Filled 2023-01-04 (×3): qty 1

## 2023-01-04 NOTE — Hospital Course (Addendum)
Mr. Homeyer was admitted to the hospital with the working diagnosis of heart failure exacerbation.   82 yo male with the past medical history of coronary artery disease, hypertension, hyperlipidemia and diastolic heart failure who presented with dyspnea. Reported 3 days of worsening dyspnea, associated with lower extremity edema.  Recent hospitalization 10/11 to 12/30/22 for unstable angina, he underwent PCI with LAD stenting.  On the day of admission he was evaluated at the outpatient pulmonary clinic, he was found volume overloaded and was referred to the ED for further evaluation.  On his initial physical examination his blood pressure was 135/60, HR 69, RR 20 and 02 saturation 95%, on supplemental 02 per [place], lungs with no wheezing or rhonchi, heart with S1 and S2 present and regular, abdomen with no distention and positive lower extremity edema.   Na 130, K 4,6 Cl 95, bicarbonate 27, glucose 111, bun 31 cr 1,4  BNP 136  Wbc 9,3 hgb 11.9 plt 241 D dimer 1,0  TSH 2,0  Urine analysis SG 1,010, protein negative, negative leukocytes and moderate hgb, rbc 0-5   Chest radiograph with no cardiomegaly, no infiltrates or effusions, positive bibasilar atelectasis.   CT chest with no definite evidence of pulmonary embolism.  Faint bilateral ground glass opacities, with mild bibasilar atelectasis. Extensive coronary artery calcification.   10/18 patient with improved volume status, renal function not back to baseline.  10/19 improving renal function, plan for discharge home today and have close follow up as outpatient.

## 2023-01-04 NOTE — Assessment & Plan Note (Addendum)
No chest pain. Continue with antiplatelet therapy and statin.  Continue with isosorbide.

## 2023-01-04 NOTE — Progress Notes (Signed)
  Echocardiogram 2D Echocardiogram has been performed.  Jeffrey Acevedo 01/04/2023, 2:38 PM

## 2023-01-04 NOTE — Plan of Care (Signed)
  Problem: Nutrition: Goal: Adequate nutrition will be maintained Outcome: Completed/Met   Problem: Coping: Goal: Level of anxiety will decrease Outcome: Completed/Met   Problem: Elimination: Goal: Will not experience complications related to bowel motility Outcome: Completed/Met Goal: Will not experience complications related to urinary retention Outcome: Completed/Met   Problem: Pain Managment: Goal: General experience of comfort will improve Outcome: Completed/Met   Problem: Skin Integrity: Goal: Risk for impaired skin integrity will decrease Outcome: Completed/Met   Problem: Respiratory: Goal: Will regain and/or maintain adequate ventilation Outcome: Completed/Met

## 2023-01-04 NOTE — Telephone Encounter (Signed)
Pt currently admitted. Will close encounter as nothing further is needed.

## 2023-01-04 NOTE — TOC Initial Note (Signed)
Transition of Care [facility]) - Initial/Assessment Note    Patient Details  Name: Jeffrey Acevedo MRN: [id number] Date of Birth: 1940-11-04  [facility] CM/SW Contact:    Leone Haven, RN Phone Number: 01/04/2023, 4:13 PM  Clinical Narrative:                 From home with spouse, has PCP and insurance on file, states has no HH services in place at this time , has shower stool and walker.   States family member (kim or Information systems manager)  will transport them home at [employer] and family is support system, states gets medications from [facility] in [place].  Pta self ambulatory with walker.  Expected Discharge Plan: Home/Self Care Barriers to Discharge: Continued Medical Work up   Patient Goals and CMS Choice Patient states their goals for this hospitalization and ongoing recovery are:: return home   Choice offered to / list presented to : NA      Expected Discharge Plan and Services In-house Referral: NA Discharge Planning Services: CM Consult Post Acute Care Choice: NA Living arrangements for the past 2 months: Single Family Home                 DME Arranged: N/A DME Agency: NA       HH Arranged: NA          Prior Living Arrangements/Services Living arrangements for the past 2 months: Single Family Home Lives with:: Spouse Patient language and need for interpreter reviewed:: Yes Do you feel safe going back to the place where you live?: Yes      Need for Family Participation in Patient Care: Yes (Comment) Care giver support system in place?: Yes (comment) Current home services: DME (shower stook and walker) Criminal Activity/Legal Involvement Pertinent to Current Situation/Hospitalization: No - Comment as needed  Activities of Daily Living   ADL Screening (condition at time of admission) Independently performs ADLs?: Yes (appropriate for developmental age) Does the patient have a NEW difficulty with bathing/dressing/toileting/self-feeding that is expected to  last >3 days?: No Does the patient have a NEW difficulty with getting in/out of bed, walking, or climbing stairs that is expected to last >3 days?: No Does the patient have a NEW difficulty with communication that is expected to last >3 days?: No Is the patient deaf or have difficulty hearing?: Yes Does the patient have difficulty seeing, even when wearing glasses/contacts?: No Does the patient have difficulty concentrating, remembering, or making decisions?: No  Permission Sought/Granted Permission sought to share information with : Case Manager Permission granted to share information with : Yes, Verbal Permission Granted              Emotional Assessment Appearance:: Appears stated age Attitude/Demeanor/Rapport: Engaged Affect (typically observed): Appropriate Orientation: : Oriented to Self, Oriented to Place, Oriented to Situation, Oriented to  Time Alcohol / Substance Use: Not Applicable Psych Involvement: No (comment)  Admission diagnosis:  Acute diastolic heart failure (HCC) [I50.31] Patient Active Problem List   Diagnosis Date Noted   Acute on chronic diastolic CHF (congestive heart failure) (HCC) 01/03/2023   Chronic hypoxic respiratory failure (HCC) 01/03/2023   Acute hyponatremia 01/03/2023   Allergic rhinitis 01/03/2023   Unstable angina (HCC) 12/29/2022   Status post coronary artery stent placement    CAD S/P percutaneous coronary angioplasty 05/02/2021   Dyspnea on exertion    Palpitations 03/25/2021   Primary osteoarthritis of right hip 04/19/2015   Essential hypertension 11/06/2012   Hyperlipidemia 11/06/2012  Coronary artery disease 11/06/2012   PCP:  [name], MD Pharmacy:   [facility] [address] [place] - [address] [place] [address] Phone: [phone] Fax: [phone]     Social Determinants of Health (SDOH) Social History: SDOH Screenings   Food Insecurity: No Food Insecurity (01/03/2023)   Housing: Low Risk  (01/03/2023)  Transportation Needs: No Transportation Needs (01/03/2023)  Recent Concern: Transportation Needs - Unmet Transportation Needs (12/12/2022)   Received from [facility]  Utilities: Not At Risk (01/03/2023)  Tobacco Use: Low Risk  (01/03/2023)   SDOH Interventions:     Readmission Risk Interventions     No data to display

## 2023-01-04 NOTE — Assessment & Plan Note (Signed)
Continue with statin therapy.  ?

## 2023-01-04 NOTE — Progress Notes (Signed)
Attempted Echocardiogram, patient is on the way to CT. Will attempt at a later time.

## 2023-01-04 NOTE — Progress Notes (Signed)
Progress Note   Patient: Jeffrey Acevedo:[id number] DOB: 04/02/1940 DOA: 01/03/2023     1 DOS: the patient was seen and examined on 01/04/2023   Brief hospital course: Jeffrey Acevedo was admitted to the hospital with the working diagnosis of heart failure exacerbation.   82 yo male with the past medical history of coronary artery disease, hypertension, hyperlipidemia and diastolic heart failure who presented with dyspnea. Reported 3 days of worsening dyspnea, associated with lower extremity edema.  Recent hospitalization 10/11 to 12/30/22 for unstable angina, he underwent PCI with LAD stenting.  On the day of admission he was evaluated at the outpatient pulmonary clinic, he was found volume overloaded and was referred to the ED for further evaluation.  On his initial physical examination his blood pressure was 135/60, HR 69, RR 20 and 02 saturation 95%, on supplemental 02 per [place], lungs with no wheezing or rhonchi, heart with S1 and S2 present and regular, abdomen with no distention and positive lower extremity edema.   Chest radiograph with no cardiomegaly, no infiltrates or effusions, positive bibasilar atelectasis.   Assessment and Plan: * Acute on chronic diastolic CHF (congestive heart failure) (HCC) Echocardiogram wit preserved LV systolic functio EF 65 to 70%, moderate asymmetric left ventricular hypertrophy of the septal segment. LV outflow tract peak 554 mmHg. RV systolic function preserved, RVSP 27.2 mmHg, LA with mild dilatation,   Fluid balance is negative 2,000 ml Systolic blood pressure is 103 to 119 mmHg.   Plan to continue diuresis with furosemide IV.  Add SGLT 2 inh and continue with metoprolol.  Spironolactone when GFR more stable.   Acute hypoxemic respiratory failure due to cardiogenic pulmonary edema. Oxygenation improving with diuresis.  Asymmetric lower extremity edema more right than left, [place] negative for DVT. Add compression stockings.   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   Renal function with serum cr at 1,52 with K at 4,1 and serum bicarbonate at 23. Na 132.  Mg 2.1  Plan to continue diuresis with furosemide, follow up renal function in am. Add SGLT 2 inh.   CAD S/P percutaneous coronary angioplasty No chest pain. Continue with antiplatelet therapy and statin.   Essential hypertension Continue blood pressure control with metoprolol. Continue diuresis.   Hyperlipidemia Continue with statin therapy.         Subjective: Patient is feeling better, dyspnea and edema have improved.   Physical Exam: Vitals:   01/03/23 2320 01/04/23 0420 01/04/23 0745 01/04/23 1155  BP: (!) 99/58 114/67 119/64 103/62  Pulse: (!) 57 (!) 52 65 (!) 58  Resp: 19 18 20  (!) 26  Temp: 98 F (36.7 C) (!) 97.5 F (36.4 C) 98.3 F (36.8 C) (!) 97.5 F (36.4 C)  TempSrc: Oral Oral Oral Oral  SpO2: 90% 93% 96% 96%  Weight:      Height:       Neurology awake and alert ENT with mild pallor Cardiovascular with S1 and S2 present and regular with no gallops, rubs or murmurs No JVD No left lower extremity edema, right lower extremity edema ++ pitting Respiratory with no rales or wheezing, no rhonchi Abdomen with no distention  Data Reviewed:    Family Communication: I spoke with patient's daughter at the bedside, we talked in detail about patient's condition, plan of care and prognosis and all questions were addressed.   Disposition: Status is: Inpatient Remains inpatient appropriate because: heart failure   Planned Discharge Destination: Home  Author: Coralie Keens, MD 01/04/2023 4:40 PM  For on call review [email or web address].

## 2023-01-04 NOTE — Assessment & Plan Note (Addendum)
Continue blood pressure control with metoprolol. Continue with isosorbide.

## 2023-01-04 NOTE — Assessment & Plan Note (Addendum)
CKD stage 2, hyponatremia.   Volume status has improved.  Renal function today with serum cr at 1,73 with K at 4,1 and serum bicarbonate at 26, Na 133.   Continue with SGLT 2 inh. Hold on loop diuretic today.  Follow up renal function in am.

## 2023-01-04 NOTE — Progress Notes (Signed)
Mobility Specialist Progress Note:   01/04/23 1208  Mobility  Activity Ambulated with assistance in hallway  Level of Assistance Contact guard assist, steadying assist  Assistive Device Front wheel walker  Distance Ambulated (ft) 250 ft  Activity Response Tolerated well  Mobility Referral Yes  $Mobility charge 1 Mobility  Mobility Specialist Start Time (ACUTE ONLY) 1140  Mobility Specialist Stop Time (ACUTE ONLY) 1152  Mobility Specialist Time Calculation (min) (ACUTE ONLY) 12 min   Received pt in chair having no complaints and agreeable to mobility. C/o slight muscle stiffness otherwise no c/o. Ambulated on 2L/min SPO2, WFL. Returned to room w/o fault. Left in chair w/ call bell in reach and all needs met.   Thompson Grayer Mobility Specialist  Please contact vis Secure Chat or  Rehab Office [phone]

## 2023-01-04 NOTE — Assessment & Plan Note (Addendum)
Echocardiogram wit preserved LV systolic functio EF 65 to 70%, moderate asymmetric left ventricular hypertrophy of the septal segment. LV outflow tract peak 554 mmHg. RV systolic function preserved, RVSP 27.2 mmHg, LA with mild dilatation,   Fluid balance is negative 2,125 ml Systolic blood pressure is 100 to 101 mmHg.   Continue with SGLT 2 inh and metoprolol.  Spironolactone when GFR more stable.  Hold on loop diuretic today.  Acute on chronic hypoxemic respiratory failure due to cardiogenic pulmonary edema. Oxygenation improving with diuresis.  Asymmetric lower extremity edema more right than left, [place] negative for DVT. 02 saturation today 94% on 4 L/min per [place], he is on home 02.

## 2023-01-05 DIAGNOSIS — N179 Acute kidney failure, unspecified: Secondary | ICD-10-CM | POA: Diagnosis not present

## 2023-01-05 DIAGNOSIS — E785 Hyperlipidemia, unspecified: Secondary | ICD-10-CM

## 2023-01-05 DIAGNOSIS — I5033 Acute on chronic diastolic (congestive) heart failure: Secondary | ICD-10-CM | POA: Diagnosis not present

## 2023-01-05 DIAGNOSIS — I2510 Atherosclerotic heart disease of native coronary artery without angina pectoris: Secondary | ICD-10-CM | POA: Diagnosis not present

## 2023-01-05 DIAGNOSIS — I10 Essential (primary) hypertension: Secondary | ICD-10-CM | POA: Diagnosis not present

## 2023-01-05 LAB — BASIC METABOLIC PANEL
Anion gap: 13 (ref 5–15)
BUN: 31 mg/dL — ABNORMAL HIGH (ref 8–23)
CO2: 26 mmol/L (ref 22–32)
Calcium: 9.5 mg/dL (ref 8.9–10.3)
Chloride: 94 mmol/L — ABNORMAL LOW (ref 98–111)
Creatinine, Ser: 1.73 mg/dL — ABNORMAL HIGH (ref 0.61–1.24)
GFR, Estimated: 39 mL/min — ABNORMAL LOW (ref >60)
Glucose, Bld: 97 mg/dL (ref 70–99)
Potassium: 4.1 mmol/L (ref 3.5–5.1)
Sodium: 133 mmol/L — ABNORMAL LOW (ref 135–145)

## 2023-01-05 LAB — MAGNESIUM: Magnesium: 2.1 mg/dL (ref 1.7–2.4)

## 2023-01-05 NOTE — Progress Notes (Signed)
Heart Failure Navigator Progress Note  Assessed for Heart & Vascular TOC clinic readiness.  Patient does not meet criteria due to EF 65-70%, has a scheduled [facility] appointment on 01/12/2023.   Navigator will sign off at this time.  Rhae Hammock, BSN, Scientist, clinical (histocompatibility and immunogenetics) Only

## 2023-01-05 NOTE — Plan of Care (Signed)
  Problem: Clinical Measurements: Goal: Respiratory complications will improve Outcome: Progressing   Problem: Clinical Measurements: Goal: Cardiovascular complication will be avoided Outcome: Progressing   

## 2023-01-05 NOTE — Progress Notes (Addendum)
Progress Note   Patient: MASLAH GUCCIARDO ZOX:[id number] DOB: 24-Sep-1940 DOA: 01/03/2023     2 DOS: the patient was seen and examined on 01/05/2023   Brief hospital course: Mr. Biagiotti was admitted to the hospital with the working diagnosis of heart failure exacerbation.   82 yo male with the past medical history of coronary artery disease, hypertension, hyperlipidemia and diastolic heart failure who presented with dyspnea. Reported 3 days of worsening dyspnea, associated with lower extremity edema.  Recent hospitalization 10/11 to 12/30/22 for unstable angina, he underwent PCI with LAD stenting.  On the day of admission he was evaluated at the outpatient pulmonary clinic, he was found volume overloaded and was referred to the ED for further evaluation.  On his initial physical examination his blood pressure was 135/60, HR 69, RR 20 and 02 saturation 95%, on supplemental 02 per [place], lungs with no wheezing or rhonchi, heart with S1 and S2 present and regular, abdomen with no distention and positive lower extremity edema.   Chest radiograph with no cardiomegaly, no infiltrates or effusions, positive bibasilar atelectasis.   10/18 patient with improved volume status, renal function not back to baseline.   Assessment and Plan: * Acute on chronic diastolic CHF (congestive heart failure) (HCC) Echocardiogram wit preserved LV systolic functio EF 65 to 70%, moderate asymmetric left ventricular hypertrophy of the septal segment. LV outflow tract peak 554 mmHg. RV systolic function preserved, RVSP 27.2 mmHg, LA with mild dilatation,   Fluid balance is negative 2,125 ml Systolic blood pressure is 100 to 101 mmHg.   Continue with SGLT 2 inh and metoprolol.  Spironolactone when GFR more stable.  Hold on loop diuretic today.  Acute on chronic hypoxemic respiratory failure due to cardiogenic pulmonary edema. Oxygenation improving with diuresis.  Asymmetric lower extremity edema more right than left, [place]  negative for DVT. 02 saturation today 94% on 4 L/min per [place], he is on home 02.    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   Volume status has improved.  Renal function today with serum cr at 1,73 with K at 4,1 and serum bicarbonate at 26, Na 133.   Continue with SGLT 2 inh. Hold on loop diuretic today.  Follow up renal function in am.   CAD S/P percutaneous coronary angioplasty No chest pain. Continue with antiplatelet therapy and statin.  Continue with isosorbide.   Essential hypertension Continue blood pressure control with metoprolol. Continue with isosorbide.   Hyperlipidemia Continue with statin therapy.         Subjective: Patient is feeling much better, he has been ambulating in the hallway with supervision with good toleration. Dyspnea and edema continue to improve.   Physical Exam: Vitals:   01/05/23 0421 01/05/23 0743 01/05/23 1135 01/05/23 1325  BP: (!) 91/41 106/71 (!) 100/59 (!) 101/55  Pulse: (!) 54 60 (!) 48 (!) 57  Resp: 19 20 20  (!) 26  Temp: 98.5 F (36.9 C) (!) 97.4 F (36.3 C) (!) 97.4 F (36.3 C) 97.8 F (36.6 C)  TempSrc: Oral Oral Oral Oral  SpO2: 94% 95% 95% 94%  Weight: 91.5 kg     Height:       Neurology awake and alert ENT with mild pallor Cardiovascular with S1 and S2 present and regular with no gallops, positive systolic murmur at the apex Respiratory with no rales or wheezing, no rhonchi Abdomen with no distention  No left lower extremity edema right lower extremity edema +  pitting,  Data Reviewed:    Family Communication: I spoke with patient's sister at the bedside, we talked in detail about patient's condition, plan of care and prognosis and all questions were addressed.   Disposition: Status is: Inpatient Remains inpatient appropriate because: pending improvement in renal function   Planned Discharge Destination: Home     Author: Coralie Keens,  MD 01/05/2023 3:22 PM  For on call review [email or web address].

## 2023-01-05 NOTE — Plan of Care (Signed)
CHL Tonsillectomy/Adenoidectomy, Postoperative PEDS care plan entered in error.

## 2023-01-05 NOTE — Progress Notes (Signed)
Mobility Specialist Progress Note:    01/05/23 0951  Mobility  Activity Ambulated with assistance in hallway  Level of Assistance Contact guard assist, steadying assist  Assistive Device Front wheel walker  Distance Ambulated (ft) 250 ft  Activity Response Tolerated well  Mobility Referral Yes  $Mobility charge 1 Mobility  Mobility Specialist Start Time (ACUTE ONLY) [id number]  Mobility Specialist Stop Time (ACUTE ONLY) 1000  Mobility Specialist Time Calculation (min) (ACUTE ONLY) 9 min   Pt received in bed, agreeable to mobility session. Ambulated in hallway, CGA required for safety. Returned pt to room, sitting up in chair, alarm on, call bell in reach, all needs met.   Pre Mobility: 60 bpm During Mobility: 67 bpm After Mobility: 61 bpm  Feliciana Rossetti Mobility Specialist Please contact via Special educational needs teacher or  Rehab office at [phone]

## 2023-01-06 ENCOUNTER — Other Ambulatory Visit (HOSPITAL_COMMUNITY): Payer: Self-pay

## 2023-01-06 DIAGNOSIS — N179 Acute kidney failure, unspecified: Secondary | ICD-10-CM | POA: Diagnosis not present

## 2023-01-06 DIAGNOSIS — I5033 Acute on chronic diastolic (congestive) heart failure: Secondary | ICD-10-CM | POA: Diagnosis not present

## 2023-01-06 DIAGNOSIS — I10 Essential (primary) hypertension: Secondary | ICD-10-CM | POA: Diagnosis not present

## 2023-01-06 DIAGNOSIS — I2510 Atherosclerotic heart disease of native coronary artery without angina pectoris: Secondary | ICD-10-CM | POA: Diagnosis not present

## 2023-01-06 LAB — BASIC METABOLIC PANEL
Anion gap: 11 (ref 5–15)
BUN: 28 mg/dL — ABNORMAL HIGH (ref 8–23)
CO2: 27 mmol/L (ref 22–32)
Calcium: 9.1 mg/dL (ref 8.9–10.3)
Chloride: 95 mmol/L — ABNORMAL LOW (ref 98–111)
Creatinine, Ser: 1.58 mg/dL — ABNORMAL HIGH (ref 0.61–1.24)
GFR, Estimated: 44 mL/min — ABNORMAL LOW (ref >60)
Glucose, Bld: 100 mg/dL — ABNORMAL HIGH (ref 70–99)
Potassium: 4.6 mmol/L (ref 3.5–5.1)
Sodium: 133 mmol/L — ABNORMAL LOW (ref 135–145)

## 2023-01-06 MED ORDER — EMPAGLIFLOZIN 10 MG PO TABS
10.0000 mg | ORAL_TABLET | Freq: Every day | ORAL | 0 refills | Status: DC
  Filled 2023-01-06: qty 30, 30d supply, fill #0

## 2023-01-06 MED ORDER — FUROSEMIDE 40 MG PO TABS
40.0000 mg | ORAL_TABLET | Freq: Every day | ORAL | 2 refills | Status: DC
  Filled 2023-01-06: qty 30, 15d supply, fill #0

## 2023-01-06 NOTE — TOC Transition Note (Signed)
Transition of Care [facility]) - CM/SW Discharge Note   Patient Details  Name: Jeffrey Acevedo MRN: [id number] Date of Birth: [birth year]  Transition of Care [facility]) CM/SW Contact:  Lawerance Sabal, RN Phone Number: 01/06/2023, 11:42 AM   Clinical Narrative:     Patient active w [facility], has home oxygen prior to admission.  [facility] notified of DC, family will transport home     Barriers to Discharge: Continued Medical Work up   Patient Goals and CMS Choice   Choice offered to / list presented to : NA  Discharge Placement                         Discharge Plan and Services Additional resources added to the After Visit Summary for   In-house Referral: NA Discharge Planning Services: CM Consult Post Acute Care Choice: NA          DME Arranged: N/A DME Agency: NA       HH Arranged: NA          Social Determinants of Health (SDOH) Interventions SDOH Screenings   Food Insecurity: No Food Insecurity (01/03/2023)  Housing: Low Risk  (01/03/2023)  Transportation Needs: No Transportation Needs (01/03/2023)  Recent Concern: Transportation Needs - Unmet Transportation Needs (12/12/2022)   Received from [facility]  Utilities: Not At Risk (01/03/2023)  Tobacco Use: Low Risk  (01/03/2023)     Readmission Risk Interventions     No data to display

## 2023-01-06 NOTE — Discharge Summary (Addendum)
Physician Discharge Summary   Patient: Jeffrey Acevedo MRN: [id number] DOB: 1940-05-20  Admit date:     01/03/2023  Discharge date: 01/06/23  Discharge Physician: Jeffrey Acevedo   PCP: Jeffrey Acevedo   Recommendations at discharge:    Added SGLT 2 inh to augment diuresis, instructed to continue furosemide 40 mg po daily and to increase to bid in case of volume overload, 2 to 3 lbs increase in 24 hrs or 5 lbs in 7 days.  Continue metoprolol for B blocker, possible addition of spironolactone when renal function more stable.  Hold on amlodipine to prevent hypotension, positive left ventricle outflow tract with high gradient, avoid vasodilators. Because coronary artery disease, will continue isosorbide for now.  Follow up with Jeffrey Acevedo in 7 to 10 days. Follow up renal function and electrolytes in 7 days. Follow up with Cardiology as scheduled.   I spoke with patient's daughter over the phone at the bedside, we talked in detail about patient's condition, plan of care and prognosis and all questions were addressed.   Discharge Diagnoses: Principal Problem:   Acute on chronic diastolic CHF (congestive heart failure) (HCC) Active Problems:   Acute kidney injury superimposed on chronic kidney disease (HCC)   CAD S/P percutaneous coronary angioplasty   Essential hypertension   Hyperlipidemia  Resolved Problems:   * No resolved hospital problems. [facility] Course: Jeffrey Acevedo was admitted to the hospital with the working diagnosis of heart failure exacerbation.   82 yo male with the past medical history of coronary artery disease, hypertension, hyperlipidemia and diastolic heart failure who presented with dyspnea. Reported 3 days of worsening dyspnea, associated with lower extremity edema.  Recent hospitalization 10/11 to 12/30/22 for unstable angina, he underwent PCI with LAD stenting.  On the day of admission he was evaluated at the outpatient pulmonary clinic, he was  found volume overloaded and was referred to the ED for further evaluation.  On his initial physical examination his blood pressure was 135/60, HR 69, RR 20 and 02 saturation 95%, on supplemental 02 per Dewy Rose, lungs with no wheezing or rhonchi, heart with S1 and S2 present and regular, abdomen with no distention and positive lower extremity edema.   Na 130, K 4,6 Cl 95, bicarbonate 27, glucose 111, bun 31 cr 1,4  BNP 136  Wbc 9,3 hgb 11.9 plt 241 D dimer 1,0  TSH 2,0  Urine analysis SG 1,010, protein negative, negative leukocytes and moderate hgb, rbc 0-5   Chest radiograph with no cardiomegaly, no infiltrates or effusions, positive bibasilar atelectasis.   CT chest with no definite evidence of pulmonary embolism.  Faint bilateral ground glass opacities, with mild bibasilar atelectasis. Extensive coronary artery calcification.   10/18 patient with improved volume status, renal function not back to baseline.  10/19 improving renal function, plan for discharge home today and have close follow up as outpatient.   Assessment and Plan: * Acute on chronic diastolic CHF (congestive heart failure) (HCC) Echocardiogram wit preserved LV systolic functio EF 65 to 70%, moderate asymmetric left ventricular hypertrophy of the septal segment. LV outflow tract peak 54 mmHg. RV systolic function preserved, RVSP 27.2 mmHg, LA with mild dilatation,   Patient was placed on IV furosemide for diuresis, negative fluid balance was achieved. Patient lost 3 Kg during his hospitalization.   SGLT 2 inh has been added and patient will continue metoprolol.  Instructed to continue furosemide 40 mg daily and to increase to bid in case of volume  sertraline 25 MG tablet Commonly known as: ZOLOFT Take 25 mg by mouth daily.   VITAMIN B-12 PO Take 2,500 mcg by mouth daily.        Follow-up Information     Jeffrey Acevedo. Nyra Capes on 01/09/2023.   Specialty: Internal Medicine Why: @4 :00pm Contact information: [address] [place] [address] [phone]                Discharge Exam: Filed Weights   01/03/23 2030 01/05/23 0421 01/06/23 0336  Weight: 94.1 kg 91.5 kg 91.7 kg   BP 112/72 (BP Location: Right Arm)   Pulse (!) 55   Temp (!) 97.5 F (36.4 C) (Oral)   Resp (!) 22   Ht 6' (1.829 m)   Wt 91.7 kg   SpO2 96%   BMI 27.42 kg/m   Patient is feeling better, dyspnea and edema have improved, no PND or orthopnea.   Neurology awake and alert ENT with mild pallor Cardiovascular with S1  and S2 present and regular with no gallops, or rubs, no murmurs Respiratory with no rales or wheezing, no rhonchi Abdomen with no distention  Left lower extremity with no edema, right lower extremity with edema +   Condition at discharge: stable  The results of significant diagnostics from this hospitalization (including imaging, microbiology, ancillary and laboratory) are listed below for reference.   Imaging Studies: ECHOCARDIOGRAM COMPLETE  Result Date: 01/04/2023    ECHOCARDIOGRAM REPORT   Patient Name:   Jeffrey Acevedo Date of Exam: 01/04/2023 Medical Rec #:  [id number]       Height:       72.0 in Accession #:    [id number]      Weight:       207.5 lb Date of Birth:  1940-11-09       BSA:          2.164 m Patient Age:    82 years        BP:           114/67 mmHg Patient Gender: M               HR:           59 bpm. Exam Location:  Inpatient Procedure: 2D Echo, Cardiac Doppler and Color Doppler Indications:    CHF-Acute Diastolic I50.31  History:        Patient has prior history of Echocardiogram examinations, most                 recent 04/04/2021. CHF, CAD and Angina, Signs/Symptoms:Dyspnea;                 Risk Factors:Hypertension.  Sonographer:    Lucendia Herrlich Referring Phys: [id number] Jeffrey Acevedo IMPRESSIONS  1. Left ventricular ejection fraction, by estimation, is 65 to 70%. The left ventricle has hyperdynamic function. The left ventricle has no regional wall motion abnormalities. There is moderate asymmetric left ventricular hypertrophy of the septal segment. LV outflow tract gradient peak 54 mmHg. Left ventricular diastolic parameters are consistent with Grade I diastolic dysfunction (impaired relaxation).  2. Right ventricular systolic function is normal. The right ventricular size is normal. There is normal pulmonary artery systolic pressure. The estimated right ventricular systolic pressure is 27.2 mmHg.  3. Left atrial size was mildly dilated.  4. There is mitral chordal and  probably valvular systolic anterior motion. The mitral valve is abnormal. Trivial mitral valve regurgitation. No evidence of mitral stenosis.  5.  sertraline 25 MG tablet Commonly known as: ZOLOFT Take 25 mg by mouth daily.   VITAMIN B-12 PO Take 2,500 mcg by mouth daily.        Follow-up Information     Jeffrey Acevedo. Nyra Capes on 01/09/2023.   Specialty: Internal Medicine Why: @4 :00pm Contact information: [address] [place] [address] [phone]                Discharge Exam: Filed Weights   01/03/23 2030 01/05/23 0421 01/06/23 0336  Weight: 94.1 kg 91.5 kg 91.7 kg   BP 112/72 (BP Location: Right Arm)   Pulse (!) 55   Temp (!) 97.5 F (36.4 C) (Oral)   Resp (!) 22   Ht 6' (1.829 m)   Wt 91.7 kg   SpO2 96%   BMI 27.42 kg/m   Patient is feeling better, dyspnea and edema have improved, no PND or orthopnea.   Neurology awake and alert ENT with mild pallor Cardiovascular with S1  and S2 present and regular with no gallops, or rubs, no murmurs Respiratory with no rales or wheezing, no rhonchi Abdomen with no distention  Left lower extremity with no edema, right lower extremity with edema +   Condition at discharge: stable  The results of significant diagnostics from this hospitalization (including imaging, microbiology, ancillary and laboratory) are listed below for reference.   Imaging Studies: ECHOCARDIOGRAM COMPLETE  Result Date: 01/04/2023    ECHOCARDIOGRAM REPORT   Patient Name:   Jeffrey Acevedo Date of Exam: 01/04/2023 Medical Rec #:  [id number]       Height:       72.0 in Accession #:    [id number]      Weight:       207.5 lb Date of Birth:  1940-11-09       BSA:          2.164 m Patient Age:    82 years        BP:           114/67 mmHg Patient Gender: M               HR:           59 bpm. Exam Location:  Inpatient Procedure: 2D Echo, Cardiac Doppler and Color Doppler Indications:    CHF-Acute Diastolic I50.31  History:        Patient has prior history of Echocardiogram examinations, most                 recent 04/04/2021. CHF, CAD and Angina, Signs/Symptoms:Dyspnea;                 Risk Factors:Hypertension.  Sonographer:    Lucendia Herrlich Referring Phys: [id number] Jeffrey Acevedo IMPRESSIONS  1. Left ventricular ejection fraction, by estimation, is 65 to 70%. The left ventricle has hyperdynamic function. The left ventricle has no regional wall motion abnormalities. There is moderate asymmetric left ventricular hypertrophy of the septal segment. LV outflow tract gradient peak 54 mmHg. Left ventricular diastolic parameters are consistent with Grade I diastolic dysfunction (impaired relaxation).  2. Right ventricular systolic function is normal. The right ventricular size is normal. There is normal pulmonary artery systolic pressure. The estimated right ventricular systolic pressure is 27.2 mmHg.  3. Left atrial size was mildly dilated.  4. There is mitral chordal and  probably valvular systolic anterior motion. The mitral valve is abnormal. Trivial mitral valve regurgitation. No evidence of mitral stenosis.  5.  sertraline 25 MG tablet Commonly known as: ZOLOFT Take 25 mg by mouth daily.   VITAMIN B-12 PO Take 2,500 mcg by mouth daily.        Follow-up Information     Jeffrey Acevedo. Nyra Capes on 01/09/2023.   Specialty: Internal Medicine Why: @4 :00pm Contact information: [address] [place] [address] [phone]                Discharge Exam: Filed Weights   01/03/23 2030 01/05/23 0421 01/06/23 0336  Weight: 94.1 kg 91.5 kg 91.7 kg   BP 112/72 (BP Location: Right Arm)   Pulse (!) 55   Temp (!) 97.5 F (36.4 C) (Oral)   Resp (!) 22   Ht 6' (1.829 m)   Wt 91.7 kg   SpO2 96%   BMI 27.42 kg/m   Patient is feeling better, dyspnea and edema have improved, no PND or orthopnea.   Neurology awake and alert ENT with mild pallor Cardiovascular with S1  and S2 present and regular with no gallops, or rubs, no murmurs Respiratory with no rales or wheezing, no rhonchi Abdomen with no distention  Left lower extremity with no edema, right lower extremity with edema +   Condition at discharge: stable  The results of significant diagnostics from this hospitalization (including imaging, microbiology, ancillary and laboratory) are listed below for reference.   Imaging Studies: ECHOCARDIOGRAM COMPLETE  Result Date: 01/04/2023    ECHOCARDIOGRAM REPORT   Patient Name:   Jeffrey Acevedo Date of Exam: 01/04/2023 Medical Rec #:  [id number]       Height:       72.0 in Accession #:    [id number]      Weight:       207.5 lb Date of Birth:  1940-11-09       BSA:          2.164 m Patient Age:    82 years        BP:           114/67 mmHg Patient Gender: M               HR:           59 bpm. Exam Location:  Inpatient Procedure: 2D Echo, Cardiac Doppler and Color Doppler Indications:    CHF-Acute Diastolic I50.31  History:        Patient has prior history of Echocardiogram examinations, most                 recent 04/04/2021. CHF, CAD and Angina, Signs/Symptoms:Dyspnea;                 Risk Factors:Hypertension.  Sonographer:    Lucendia Herrlich Referring Phys: [id number] Jeffrey Acevedo IMPRESSIONS  1. Left ventricular ejection fraction, by estimation, is 65 to 70%. The left ventricle has hyperdynamic function. The left ventricle has no regional wall motion abnormalities. There is moderate asymmetric left ventricular hypertrophy of the septal segment. LV outflow tract gradient peak 54 mmHg. Left ventricular diastolic parameters are consistent with Grade I diastolic dysfunction (impaired relaxation).  2. Right ventricular systolic function is normal. The right ventricular size is normal. There is normal pulmonary artery systolic pressure. The estimated right ventricular systolic pressure is 27.2 mmHg.  3. Left atrial size was mildly dilated.  4. There is mitral chordal and  probably valvular systolic anterior motion. The mitral valve is abnormal. Trivial mitral valve regurgitation. No evidence of mitral stenosis.  5.  Physician Discharge Summary   Patient: Jeffrey Acevedo MRN: [id number] DOB: 1940-05-20  Admit date:     01/03/2023  Discharge date: 01/06/23  Discharge Physician: Jeffrey Acevedo   PCP: Jeffrey Acevedo   Recommendations at discharge:    Added SGLT 2 inh to augment diuresis, instructed to continue furosemide 40 mg po daily and to increase to bid in case of volume overload, 2 to 3 lbs increase in 24 hrs or 5 lbs in 7 days.  Continue metoprolol for B blocker, possible addition of spironolactone when renal function more stable.  Hold on amlodipine to prevent hypotension, positive left ventricle outflow tract with high gradient, avoid vasodilators. Because coronary artery disease, will continue isosorbide for now.  Follow up with Jeffrey Acevedo in 7 to 10 days. Follow up renal function and electrolytes in 7 days. Follow up with Cardiology as scheduled.   I spoke with patient's daughter over the phone at the bedside, we talked in detail about patient's condition, plan of care and prognosis and all questions were addressed.   Discharge Diagnoses: Principal Problem:   Acute on chronic diastolic CHF (congestive heart failure) (HCC) Active Problems:   Acute kidney injury superimposed on chronic kidney disease (HCC)   CAD S/P percutaneous coronary angioplasty   Essential hypertension   Hyperlipidemia  Resolved Problems:   * No resolved hospital problems. [facility] Course: Jeffrey Acevedo was admitted to the hospital with the working diagnosis of heart failure exacerbation.   82 yo male with the past medical history of coronary artery disease, hypertension, hyperlipidemia and diastolic heart failure who presented with dyspnea. Reported 3 days of worsening dyspnea, associated with lower extremity edema.  Recent hospitalization 10/11 to 12/30/22 for unstable angina, he underwent PCI with LAD stenting.  On the day of admission he was evaluated at the outpatient pulmonary clinic, he was  found volume overloaded and was referred to the ED for further evaluation.  On his initial physical examination his blood pressure was 135/60, HR 69, RR 20 and 02 saturation 95%, on supplemental 02 per Dewy Rose, lungs with no wheezing or rhonchi, heart with S1 and S2 present and regular, abdomen with no distention and positive lower extremity edema.   Na 130, K 4,6 Cl 95, bicarbonate 27, glucose 111, bun 31 cr 1,4  BNP 136  Wbc 9,3 hgb 11.9 plt 241 D dimer 1,0  TSH 2,0  Urine analysis SG 1,010, protein negative, negative leukocytes and moderate hgb, rbc 0-5   Chest radiograph with no cardiomegaly, no infiltrates or effusions, positive bibasilar atelectasis.   CT chest with no definite evidence of pulmonary embolism.  Faint bilateral ground glass opacities, with mild bibasilar atelectasis. Extensive coronary artery calcification.   10/18 patient with improved volume status, renal function not back to baseline.  10/19 improving renal function, plan for discharge home today and have close follow up as outpatient.   Assessment and Plan: * Acute on chronic diastolic CHF (congestive heart failure) (HCC) Echocardiogram wit preserved LV systolic functio EF 65 to 70%, moderate asymmetric left ventricular hypertrophy of the septal segment. LV outflow tract peak 54 mmHg. RV systolic function preserved, RVSP 27.2 mmHg, LA with mild dilatation,   Patient was placed on IV furosemide for diuresis, negative fluid balance was achieved. Patient lost 3 Kg during his hospitalization.   SGLT 2 inh has been added and patient will continue metoprolol.  Instructed to continue furosemide 40 mg daily and to increase to bid in case of volume  Physician Discharge Summary   Patient: Jeffrey Acevedo MRN: [id number] DOB: 1940-05-20  Admit date:     01/03/2023  Discharge date: 01/06/23  Discharge Physician: Jeffrey Acevedo   PCP: Jeffrey Acevedo   Recommendations at discharge:    Added SGLT 2 inh to augment diuresis, instructed to continue furosemide 40 mg po daily and to increase to bid in case of volume overload, 2 to 3 lbs increase in 24 hrs or 5 lbs in 7 days.  Continue metoprolol for B blocker, possible addition of spironolactone when renal function more stable.  Hold on amlodipine to prevent hypotension, positive left ventricle outflow tract with high gradient, avoid vasodilators. Because coronary artery disease, will continue isosorbide for now.  Follow up with Jeffrey Acevedo in 7 to 10 days. Follow up renal function and electrolytes in 7 days. Follow up with Cardiology as scheduled.   I spoke with patient's daughter over the phone at the bedside, we talked in detail about patient's condition, plan of care and prognosis and all questions were addressed.   Discharge Diagnoses: Principal Problem:   Acute on chronic diastolic CHF (congestive heart failure) (HCC) Active Problems:   Acute kidney injury superimposed on chronic kidney disease (HCC)   CAD S/P percutaneous coronary angioplasty   Essential hypertension   Hyperlipidemia  Resolved Problems:   * No resolved hospital problems. [facility] Course: Jeffrey Acevedo was admitted to the hospital with the working diagnosis of heart failure exacerbation.   82 yo male with the past medical history of coronary artery disease, hypertension, hyperlipidemia and diastolic heart failure who presented with dyspnea. Reported 3 days of worsening dyspnea, associated with lower extremity edema.  Recent hospitalization 10/11 to 12/30/22 for unstable angina, he underwent PCI with LAD stenting.  On the day of admission he was evaluated at the outpatient pulmonary clinic, he was  found volume overloaded and was referred to the ED for further evaluation.  On his initial physical examination his blood pressure was 135/60, HR 69, RR 20 and 02 saturation 95%, on supplemental 02 per Dewy Rose, lungs with no wheezing or rhonchi, heart with S1 and S2 present and regular, abdomen with no distention and positive lower extremity edema.   Na 130, K 4,6 Cl 95, bicarbonate 27, glucose 111, bun 31 cr 1,4  BNP 136  Wbc 9,3 hgb 11.9 plt 241 D dimer 1,0  TSH 2,0  Urine analysis SG 1,010, protein negative, negative leukocytes and moderate hgb, rbc 0-5   Chest radiograph with no cardiomegaly, no infiltrates or effusions, positive bibasilar atelectasis.   CT chest with no definite evidence of pulmonary embolism.  Faint bilateral ground glass opacities, with mild bibasilar atelectasis. Extensive coronary artery calcification.   10/18 patient with improved volume status, renal function not back to baseline.  10/19 improving renal function, plan for discharge home today and have close follow up as outpatient.   Assessment and Plan: * Acute on chronic diastolic CHF (congestive heart failure) (HCC) Echocardiogram wit preserved LV systolic functio EF 65 to 70%, moderate asymmetric left ventricular hypertrophy of the septal segment. LV outflow tract peak 54 mmHg. RV systolic function preserved, RVSP 27.2 mmHg, LA with mild dilatation,   Patient was placed on IV furosemide for diuresis, negative fluid balance was achieved. Patient lost 3 Kg during his hospitalization.   SGLT 2 inh has been added and patient will continue metoprolol.  Instructed to continue furosemide 40 mg daily and to increase to bid in case of volume  sertraline 25 MG tablet Commonly known as: ZOLOFT Take 25 mg by mouth daily.   VITAMIN B-12 PO Take 2,500 mcg by mouth daily.        Follow-up Information     Jeffrey Acevedo. Nyra Capes on 01/09/2023.   Specialty: Internal Medicine Why: @4 :00pm Contact information: [address] [place] [address] [phone]                Discharge Exam: Filed Weights   01/03/23 2030 01/05/23 0421 01/06/23 0336  Weight: 94.1 kg 91.5 kg 91.7 kg   BP 112/72 (BP Location: Right Arm)   Pulse (!) 55   Temp (!) 97.5 F (36.4 C) (Oral)   Resp (!) 22   Ht 6' (1.829 m)   Wt 91.7 kg   SpO2 96%   BMI 27.42 kg/m   Patient is feeling better, dyspnea and edema have improved, no PND or orthopnea.   Neurology awake and alert ENT with mild pallor Cardiovascular with S1  and S2 present and regular with no gallops, or rubs, no murmurs Respiratory with no rales or wheezing, no rhonchi Abdomen with no distention  Left lower extremity with no edema, right lower extremity with edema +   Condition at discharge: stable  The results of significant diagnostics from this hospitalization (including imaging, microbiology, ancillary and laboratory) are listed below for reference.   Imaging Studies: ECHOCARDIOGRAM COMPLETE  Result Date: 01/04/2023    ECHOCARDIOGRAM REPORT   Patient Name:   Jeffrey Acevedo Date of Exam: 01/04/2023 Medical Rec #:  [id number]       Height:       72.0 in Accession #:    [id number]      Weight:       207.5 lb Date of Birth:  1940-11-09       BSA:          2.164 m Patient Age:    82 years        BP:           114/67 mmHg Patient Gender: M               HR:           59 bpm. Exam Location:  Inpatient Procedure: 2D Echo, Cardiac Doppler and Color Doppler Indications:    CHF-Acute Diastolic I50.31  History:        Patient has prior history of Echocardiogram examinations, most                 recent 04/04/2021. CHF, CAD and Angina, Signs/Symptoms:Dyspnea;                 Risk Factors:Hypertension.  Sonographer:    Lucendia Herrlich Referring Phys: [id number] Jeffrey Acevedo IMPRESSIONS  1. Left ventricular ejection fraction, by estimation, is 65 to 70%. The left ventricle has hyperdynamic function. The left ventricle has no regional wall motion abnormalities. There is moderate asymmetric left ventricular hypertrophy of the septal segment. LV outflow tract gradient peak 54 mmHg. Left ventricular diastolic parameters are consistent with Grade I diastolic dysfunction (impaired relaxation).  2. Right ventricular systolic function is normal. The right ventricular size is normal. There is normal pulmonary artery systolic pressure. The estimated right ventricular systolic pressure is 27.2 mmHg.  3. Left atrial size was mildly dilated.  4. There is mitral chordal and  probably valvular systolic anterior motion. The mitral valve is abnormal. Trivial mitral valve regurgitation. No evidence of mitral stenosis.  5.

## 2023-01-11 NOTE — Progress Notes (Addendum)
Cardiology Office Note:    Date:  01/12/2023  ID:  KRIZ CANEDY, DOB Sep 18, 1940, MRN [id number] PCP: [name], MD  [facility] Providers Cardiologist:  [name], MD       Patient Profile:     Hypertension Hyperlipidemia Coronary artery disease Diastolic heart failure      History of Present Illness:  Discussed the use of AI scribe software for clinical note transcription with the patient, who gave verbal consent to proceed.  Jeffrey Acevedo is a 82 y.o. male who returns for acute on chronic diastolic heart failure admission.  Jeffrey Acevedo is a 82 year old male with a history of coronary artery disease status post LAD stenting with bare-metal stent 03/1996.  Myoview 04/2009, normal without ischemia.  On 05/2009 he had a heart cath revealing a patent proximal LAD stent with 90% stenosis beyond, multi-link vision bare-metal stent was placed.  He did have a Lexiscan Myoview stress test on 03/2015, normal without ischemia.  On 05/02/2021 he had a another heart cath revealing patent proximal and mid LAD stents with a 70% proximal to mid LAD stenosis.  A drug-eluting stent was placed.  It was noted he continued to have nitrate responsive chest pain, he had a another cath on 09/19/21 where he had another stent placed to his proximal PDA.  He was seen by Dr. Allyson Sabal on 12/27/2022 where he described progressive anginal symptoms for the past 6 weeks.  He had a another cardiac catheterization on 12/29/2022 that demonstrated 40% proximal to mid left circumflex lesion, 30% ostial to proximal RCA lesion, 60% ramus lesion, 90% mid-1 LAD lesion treated with DES.  He did have a patent proximal to mid LAD stent.  Previously placed proximal LAD to mid 80s stent widely patent, previously placed mid LAD 3 stent widely patent, and patent PDA stent without restenosis.   Following his procedure he was admitted into the hospital on 01/03/2023 and discharged 01/06/2023 with principal problem of acute on  chronic diastolic heart failure.  He reported 3 days of worsening shortness of breath associated with lower extremity edema.  On day of his admission he was evaluated outpatient pulmonary clinic where he was found to be volume overloaded and sent to the ED.  BNP 136.8. Echocardiogram on admission showed preserved LV systolic function with an EF 65 to 70%, moderate asymmetric left ventricular hypertrophy.  He was diuresed with IV Lasix.  Lost a total of 3 kg during admission. Started on Jardiance during this admission, spironolactone was held off due to kidney function. Amlodipine discontinued due to hypotension. He is sent home on supplemental oxygen.   Today:  Jeffrey Acevedo arrives in clinic today with his Acevedo with complaints of worsening shortness of breath, weight gain.  He has been taking his medication as prescribed. He is now back up to 207 pounds which was his preadmission weight for his heart failure exacerbation.  He is experiencing periorbital edema, 3+ lower leg edema (R >L), abdominal distention.  He reports adherence to low-salt diet and fluid restriction at home.  He is on 2 to 4 L oxygen at baseline, current SpO2 at 93%.  He notes generalized weakness and severe DOE just walking around his home.  Of note he is hypotensive in clinic initial reading of 80/60, repeat 96/60.  He is pale and currently lightheaded.  His Acevedo is concerned about his current symptoms.    Wt Readings from Last 3 Encounters:  01/12/23 207 lb (93.9 kg)  01/06/23 202  lb 2.6 oz (91.7 kg)  01/03/23 207 lb 9.6 oz (94.2 kg)              Review of Systems  Constitutional: Positive for weight gain. Negative for weight loss.  Cardiovascular:  Positive for dyspnea on exertion, leg swelling, orthopnea and paroxysmal nocturnal dyspnea. Negative for chest pain, claudication, cyanosis, irregular heartbeat, near-syncope, palpitations and syncope.  Respiratory:  Positive for shortness of breath. Negative for cough and  hemoptysis.   Gastrointestinal:  Negative for abdominal pain, hematochezia and melena.  Genitourinary:  Negative for hematuria.  Neurological:  Positive for light-headedness. Negative for dizziness.     See HPI     Studies Reviewed:       Echocardiogram 01/04/2023 1. Left ventricular ejection fraction, by estimation, is 65 to 70%. The  left ventricle has hyperdynamic function. The left ventricle has no  regional wall motion abnormalities. There is moderate asymmetric left  ventricular hypertrophy of the septal  segment. LV outflow tract gradient peak 54 mmHg. Left ventricular  diastolic parameters are consistent with Grade I diastolic dysfunction  (impaired relaxation).   2. Right ventricular systolic function is normal. The right ventricular  size is normal. There is normal pulmonary artery systolic pressure. The  estimated right ventricular systolic pressure is 27.2 mmHg.   3. Left atrial size was mildly dilated.   4. There is mitral chordal and probably valvular systolic anterior  motion. The mitral valve is abnormal. Trivial mitral valve regurgitation.  No evidence of mitral stenosis.   5. The aortic valve is tricuspid. Aortic valve regurgitation is not  visualized.   6. Aortic dilatation noted. There is mild dilatation of the aortic root,  measuring 38 mm.   7. The inferior vena cava is dilated in size with <50% respiratory  variability, suggesting right atrial pressure of 15 mmHg.   8. Findings consistent with HOCM.   Cardiac cath 12/29/2022   Prox Cx to Mid Cx lesion is 40% stenosed.   Ost RCA to Prox RCA lesion is 30% stenosed.   Ramus lesion is 60% stenosed.   Mid LAD-1 lesion is 90% stenosed.   Previously placed Prox LAD to Mid LAD stent of unknown type is  widely patent.   Previously placed Mid LAD-3 stent of unknown type is  widely patent.   Non-stenotic RPDA lesion was previously treated.   A drug-eluting stent was successfully placed using a SYNERGY XD  [id number].   Post intervention, there is a 0% residual stenosis.   Post intervention, there is a 0% residual stenosis.   Patent stents in the proximal and mid LAD. Hazy severe stenosis in the proximal to mid LAD between the old stents.  Successful PTCA/DES x 1 proximal to mid LAD Moderate stenosis in the intermediate branch, unchanged from last cath (Flow wire negative last cath) Mild disease in the Circumflex Large dominant RCA with mild proximal disease and patent PDA stent without restenosis.  Normal right heart pressures LVEDP 16-18 mmHg  Diagnostic Dominance: Right  Intervention    Risk Assessment/Calculations:             Physical Exam:   VS:  BP 96/60 (BP Location: Right Arm, Patient Position: Sitting, Cuff Size: Normal)   Pulse (!) 57   Ht 6' (1.829 m)   Wt 207 lb (93.9 kg)   SpO2 93%   BMI 28.07 kg/m    Wt Readings from Last 3 Encounters:  01/12/23 207 lb (93.9 kg)  01/06/23 202 lb 2.6 oz (  91.7 kg)  01/03/23 207 lb 9.6 oz (94.2 kg)    Constitutional:      Appearance: Normal and healthy appearance.  Neck:     Vascular: JVD normal.  Pulmonary:     Effort: Pulmonary effort is normal.     Breath sounds: Normal breath sounds.  Chest:     Chest wall: Not tender to palpatation.  Cardiovascular:     PMI at left midclavicular line. Normal rate. Regular rhythm. Normal S1. Normal S2.      Murmurs: There is no murmur.     No gallop.  No click. No rub.  Pulses:    Intact distal pulses.  Edema:    Peripheral edema present.    Pretibial: bilateral 3+ pitting edema of the pretibial area.    Ankle: bilateral 3+ pitting edema of the ankle. Musculoskeletal:     Cervical back: Neck supple. Skin:    General: Skin is warm.  Neurological:     General: No focal deficit present.     Mental Status: Oriented to person, place and time.  Psychiatric:        Behavior: Behavior is cooperative.        Assessment and Plan:  Acute on chronic diastolic heart failure -Recently  admitted for same on 01/03/2023 through 01/06/2023, 3kg total weight loss via diuresis -Echo 05/06/2022 with EF 65 to 70%, no RWMA, moderate LVH, grade 1 diastolic dysfunction, RV function okay -Has gained 5 pounds since admission, he is back to his preadmission HF exacerbation weight -Worsening SOB at rest, lower leg edema, periorbital edema -Was started on Jardiance last week -Unable to diurese outpatient as worsening AKI (1.52 > 1.73 > 1.58) -His GFR has decreased from 60 to 44 in 2 weeks -Pt seen by DOD Dr. Allyson Sabal who agrees pt will need ED evaluation, likely hospital admission for IV diuresis and close monitoring of kidney function in the setting of hypotension. Pt is agreeable with plan. Acevedo agreeable to transport pt to [facility] ED.  -O2 saturation 93% on 2 L nasal cannula -At this time unsure why he is retaining fluid as successful PCI and encouraging echo over the past week  2.  CAD -History of multiple PCI's, see above -Most recent, 12/29/2022 successful PCI with DES to proximal to mid LAD, which was between his old stents -No chest pain -Continue aspirin 81 mg, Plavix 75 mg, Lasix 40 mg  3.  Hypertension -BP today 80/60, repeat 96/60 -Continues to be hypertensive, amlodipine discontinued due to hypertension -At this time we will have this evaluated in the hospital  4.  AKI -Worsening renal function over the past 2 weeks, following cardiac catheterization and IV diuresis -Pending ED evaluation as above  5.  Hyperlipidemia -LDL 59 on 11/07/2022 -Consider referral to lipid clinic to discuss alternative lipid lowering therapy in the setting of statin intolerance                   Dispo: Follow-up post ED/hospital with Dr. Gery Pray or APP  Signed, Denyce Robert, AGNP-C

## 2023-01-12 ENCOUNTER — Encounter (HOSPITAL_COMMUNITY): Payer: Self-pay

## 2023-01-12 ENCOUNTER — Encounter: Payer: Self-pay | Admitting: Emergency Medicine

## 2023-01-12 ENCOUNTER — Other Ambulatory Visit: Payer: Self-pay

## 2023-01-12 ENCOUNTER — Inpatient Hospital Stay (HOSPITAL_COMMUNITY)
Admission: EM | Admit: 2023-01-12 | Discharge: 2023-01-18 | DRG: 291 | Disposition: A | Discharge status: Home Health | Payer: Medicare PPO | Source: Ambulatory Visit | Attending: Internal Medicine | Admitting: Internal Medicine

## 2023-01-12 ENCOUNTER — Ambulatory Visit: Payer: Medicare PPO | Attending: Nurse Practitioner | Admitting: Emergency Medicine

## 2023-01-12 ENCOUNTER — Emergency Department (HOSPITAL_COMMUNITY): Payer: Medicare PPO

## 2023-01-12 VITALS — BP 96/60 | HR 57 | Ht 72.0 in | Wt 207.0 lb

## 2023-01-12 DIAGNOSIS — N179 Acute kidney failure, unspecified: Secondary | ICD-10-CM | POA: Diagnosis not present

## 2023-01-12 DIAGNOSIS — E440 Moderate protein-calorie malnutrition: Secondary | ICD-10-CM

## 2023-01-12 DIAGNOSIS — Z7982 Long term (current) use of aspirin: Secondary | ICD-10-CM

## 2023-01-12 DIAGNOSIS — Z7902 Long term (current) use of antithrombotics/antiplatelets: Secondary | ICD-10-CM

## 2023-01-12 DIAGNOSIS — N289 Disorder of kidney and ureter, unspecified: Secondary | ICD-10-CM

## 2023-01-12 DIAGNOSIS — Z91040 Latex allergy status: Secondary | ICD-10-CM

## 2023-01-12 DIAGNOSIS — I10 Essential (primary) hypertension: Secondary | ICD-10-CM | POA: Diagnosis not present

## 2023-01-12 DIAGNOSIS — Z8546 Personal history of malignant neoplasm of prostate: Secondary | ICD-10-CM

## 2023-01-12 DIAGNOSIS — N189 Chronic kidney disease, unspecified: Secondary | ICD-10-CM | POA: Diagnosis present

## 2023-01-12 DIAGNOSIS — H9193 Unspecified hearing loss, bilateral: Secondary | ICD-10-CM | POA: Diagnosis present

## 2023-01-12 DIAGNOSIS — Z91048 Other nonmedicinal substance allergy status: Secondary | ICD-10-CM

## 2023-01-12 DIAGNOSIS — R739 Hyperglycemia, unspecified: Secondary | ICD-10-CM | POA: Diagnosis present

## 2023-01-12 DIAGNOSIS — E782 Mixed hyperlipidemia: Secondary | ICD-10-CM

## 2023-01-12 DIAGNOSIS — E785 Hyperlipidemia, unspecified: Secondary | ICD-10-CM | POA: Diagnosis present

## 2023-01-12 DIAGNOSIS — I2583 Coronary atherosclerosis due to lipid rich plaque: Secondary | ICD-10-CM

## 2023-01-12 DIAGNOSIS — J9611 Chronic respiratory failure with hypoxia: Secondary | ICD-10-CM | POA: Diagnosis present

## 2023-01-12 DIAGNOSIS — I5033 Acute on chronic diastolic (congestive) heart failure: Secondary | ICD-10-CM | POA: Diagnosis not present

## 2023-01-12 DIAGNOSIS — I959 Hypotension, unspecified: Secondary | ICD-10-CM | POA: Diagnosis present

## 2023-01-12 DIAGNOSIS — Z6826 Body mass index (BMI) 26.0-26.9, adult: Secondary | ICD-10-CM

## 2023-01-12 DIAGNOSIS — J9610 Chronic respiratory failure, unspecified whether with hypoxia or hypercapnia: Secondary | ICD-10-CM

## 2023-01-12 DIAGNOSIS — I130 Hypertensive heart and chronic kidney disease with heart failure and stage 1 through stage 4 chronic kidney disease, or unspecified chronic kidney disease: Secondary | ICD-10-CM | POA: Diagnosis not present

## 2023-01-12 DIAGNOSIS — I421 Obstructive hypertrophic cardiomyopathy: Secondary | ICD-10-CM | POA: Diagnosis present

## 2023-01-12 DIAGNOSIS — Z9981 Dependence on supplemental oxygen: Secondary | ICD-10-CM

## 2023-01-12 DIAGNOSIS — D649 Anemia, unspecified: Secondary | ICD-10-CM

## 2023-01-12 DIAGNOSIS — R001 Bradycardia, unspecified: Secondary | ICD-10-CM | POA: Diagnosis not present

## 2023-01-12 DIAGNOSIS — Z79899 Other long term (current) drug therapy: Secondary | ICD-10-CM

## 2023-01-12 DIAGNOSIS — Z86718 Personal history of other venous thrombosis and embolism: Secondary | ICD-10-CM

## 2023-01-12 DIAGNOSIS — R0602 Shortness of breath: Principal | ICD-10-CM

## 2023-01-12 DIAGNOSIS — I2510 Atherosclerotic heart disease of native coronary artery without angina pectoris: Secondary | ICD-10-CM

## 2023-01-12 DIAGNOSIS — Z96641 Presence of right artificial hip joint: Secondary | ICD-10-CM | POA: Diagnosis present

## 2023-01-12 DIAGNOSIS — N1831 Chronic kidney disease, stage 3a: Secondary | ICD-10-CM | POA: Diagnosis present

## 2023-01-12 DIAGNOSIS — I25119 Atherosclerotic heart disease of native coronary artery with unspecified angina pectoris: Secondary | ICD-10-CM

## 2023-01-12 DIAGNOSIS — Z888 Allergy status to other drugs, medicaments and biological substances status: Secondary | ICD-10-CM

## 2023-01-12 DIAGNOSIS — Z91119 Patient's noncompliance with dietary regimen due to unspecified reason: Secondary | ICD-10-CM

## 2023-01-12 DIAGNOSIS — Z881 Allergy status to other antibiotic agents status: Secondary | ICD-10-CM

## 2023-01-12 DIAGNOSIS — Z7984 Long term (current) use of oral hypoglycemic drugs: Secondary | ICD-10-CM

## 2023-01-12 DIAGNOSIS — Z8249 Family history of ischemic heart disease and other diseases of the circulatory system: Secondary | ICD-10-CM

## 2023-01-12 DIAGNOSIS — Z955 Presence of coronary angioplasty implant and graft: Secondary | ICD-10-CM

## 2023-01-12 LAB — BRAIN NATRIURETIC PEPTIDE: B Natriuretic Peptide: 134.3 pg/mL — ABNORMAL HIGH (ref 0.0–100.0)

## 2023-01-12 LAB — CBC
HCT: 34.1 % — ABNORMAL LOW (ref 39.0–52.0)
Hemoglobin: 10.5 g/dL — ABNORMAL LOW (ref 13.0–17.0)
MCH: 25.9 pg — ABNORMAL LOW (ref 26.0–34.0)
MCHC: 30.8 g/dL (ref 30.0–36.0)
MCV: 84.2 fL (ref 80.0–100.0)
Platelets: 230 10*3/uL (ref 150–400)
RBC: 4.05 MIL/uL — ABNORMAL LOW (ref 4.22–5.81)
RDW: 16.2 % — ABNORMAL HIGH (ref 11.5–15.5)
WBC: 9 10*3/uL (ref 4.0–10.5)
nRBC: 0 % (ref 0.0–0.2)

## 2023-01-12 LAB — BASIC METABOLIC PANEL
Anion gap: 9 (ref 5–15)
BUN: 26 mg/dL — ABNORMAL HIGH (ref 8–23)
CO2: 28 mmol/L (ref 22–32)
Calcium: 9.1 mg/dL (ref 8.9–10.3)
Chloride: 99 mmol/L (ref 98–111)
Creatinine, Ser: 1.44 mg/dL — ABNORMAL HIGH (ref 0.61–1.24)
GFR, Estimated: 49 mL/min — ABNORMAL LOW (ref >60)
Glucose, Bld: 102 mg/dL — ABNORMAL HIGH (ref 70–99)
Potassium: 4 mmol/L (ref 3.5–5.1)
Sodium: 136 mmol/L (ref 135–145)

## 2023-01-12 LAB — MAGNESIUM: Magnesium: 2.3 mg/dL (ref 1.7–2.4)

## 2023-01-12 MED ORDER — SODIUM CHLORIDE 0.9% FLUSH
3.0000 mL | Freq: Two times a day (BID) | INTRAVENOUS | Status: DC
  Administered 2023-01-13 – 2023-01-18 (×11): 3 mL via INTRAVENOUS

## 2023-01-12 MED ORDER — ENOXAPARIN SODIUM 40 MG/0.4ML IJ SOSY
40.0000 mg | PREFILLED_SYRINGE | INTRAMUSCULAR | Status: DC
  Administered 2023-01-12 – 2023-01-17 (×6): 40 mg via SUBCUTANEOUS
  Filled 2023-01-12 (×6): qty 0.4

## 2023-01-12 MED ORDER — POLYETHYLENE GLYCOL 3350 17 G PO PACK: 17.0000 g | PACK | Freq: Every day | ORAL | Status: DC | PRN

## 2023-01-12 MED ORDER — SERTRALINE HCL 50 MG PO TABS
25.0000 mg | ORAL_TABLET | Freq: Every day | ORAL | Status: DC
  Administered 2023-01-13 – 2023-01-18 (×6): 25 mg via ORAL
  Filled 2023-01-12 (×6): qty 1

## 2023-01-12 MED ORDER — CLOPIDOGREL BISULFATE 75 MG PO TABS
75.0000 mg | ORAL_TABLET | Freq: Every day | ORAL | Status: DC
Start: 2023-01-13 — End: 2023-01-18
  Administered 2023-01-13 – 2023-01-18 (×6): 75 mg via ORAL
  Filled 2023-01-12 (×6): qty 1

## 2023-01-12 MED ORDER — EMPAGLIFLOZIN 10 MG PO TABS
10.0000 mg | ORAL_TABLET | Freq: Every day | ORAL | Status: DC
  Administered 2023-01-13 – 2023-01-18 (×6): 10 mg via ORAL
  Filled 2023-01-12 (×6): qty 1

## 2023-01-12 MED ORDER — ACETAMINOPHEN 650 MG RE SUPP: 650.0000 mg | Freq: Four times a day (QID) | RECTAL | Status: DC | PRN

## 2023-01-12 MED ORDER — FUROSEMIDE 10 MG/ML IJ SOLN
40.0000 mg | Freq: Two times a day (BID) | INTRAMUSCULAR | Status: DC
  Administered 2023-01-12 – 2023-01-15 (×6): 40 mg via INTRAVENOUS
  Filled 2023-01-12 (×6): qty 4

## 2023-01-12 MED ORDER — ASPIRIN 81 MG PO TBEC
81.0000 mg | DELAYED_RELEASE_TABLET | Freq: Every day | ORAL | Status: DC
Start: 2023-01-13 — End: 2023-01-18
  Administered 2023-01-13 – 2023-01-18 (×6): 81 mg via ORAL
  Filled 2023-01-12 (×6): qty 1

## 2023-01-12 MED ORDER — ACETAMINOPHEN 325 MG PO TABS: 650.0000 mg | ORAL_TABLET | Freq: Four times a day (QID) | ORAL | Status: DC | PRN

## 2023-01-12 MED ORDER — FENOFIBRATE 54 MG PO TABS
54.0000 mg | ORAL_TABLET | Freq: Every day | ORAL | Status: DC
  Administered 2023-01-12 – 2023-01-18 (×7): 54 mg via ORAL
  Filled 2023-01-12 (×7): qty 1

## 2023-01-12 MED ORDER — FENOFIBRIC ACID 45 MG PO CPDR: 1.0000 | DELAYED_RELEASE_CAPSULE | Freq: Every day | ORAL | Status: DC

## 2023-01-12 NOTE — ED Provider Notes (Signed)
[place] EMERGENCY DEPARTMENT AT [facility] Provider Note   CSN: [id number] Arrival date & time: 01/12/23  1044     History  Chief Complaint  Patient presents with   Shortness of Breath    Jeffrey Acevedo is a 82 y.o. male.  Patient with complicated medical history presents from Dr. Hazle Coca office for evaluation of fluid overload, concern for worsening kidney function and hypotension when seen in the office just prior to arrival here. The patient denies chest pain. Recent history includes mid-LAD stent 10/11, pulmonary office visit the following week where he was placed on home oxygen for persistent SOB and persistent LE edema. He has been on 40 mg Lasix daily which was increased for one day only to 80 mg. LE dopplers reported negative for DVT. Recent ECHO with EF 60%. Per office notes [facility], NP) the patient was found to be hypotensive with systolic in the 80's, then 96, a weight gain of 5 pounds since last seen and SOB despite use of O2 via .   The history is provided by the patient and a relative. No language interpreter was used.  Shortness of Breath      Home Medications Prior to Admission medications   Medication Sig Start Date End Date Taking? Authorizing Provider  aspirin EC 81 MG EC tablet Take 1 tablet (81 mg total) by mouth daily. Swallow whole. 05/04/21   Arty Baumgartner, NP  Choline Fenofibrate (FENOFIBRIC ACID) 45 MG CPDR Take 1 capsule by mouth once daily Patient taking differently: Take 45 mg by mouth every other day. 11/02/22   Runell Gess, MD  clopidogrel (PLAVIX) 75 MG tablet Take 1 tablet (75 mg total) by mouth daily. 12/30/22   Azalee Course, PA  Cyanocobalamin (VITAMIN B-12 PO) Take 2,500 mcg by mouth daily.    [provider]  empagliflozin (JARDIANCE) 10 MG TABS tablet Take 1 tablet (10 mg total) by mouth daily. 01/07/23 02/06/23  Arrien, York Ram, MD  fluticasone (FLONASE) 50 MCG/ACT nasal spray Place 1 spray  into both nostrils daily.    [provider]  furosemide (LASIX) 40 MG tablet Take 1 tablet (40 mg total) by mouth daily. Take twice daily in case of weight gain 2 to 3 lbs in 24 hrs or 5 lbs in 7 days. 01/06/23 04/06/23  Arrien, York Ram, MD  isosorbide mononitrate (IMDUR) 30 MG 24 hr tablet Take 30 mg by mouth daily. 12/11/22   [provider]  Magnesium Citrate 200 MG TABS Take 400 mg by mouth daily. 12/13/22   Duke, Roe Rutherford, PA  metoprolol tartrate (LOPRESSOR) 25 MG tablet Take 1 tablet (25 mg total) by mouth 2 (two) times daily. 12/30/22   Azalee Course, PA  nitroGLYCERIN (NITROSTAT) 0.4 MG SL tablet Place 1 tablet (0.4 mg total) under the tongue every 5 (five) minutes as needed for chest pain. 11/14/22   Runell Gess, MD  Omega-3 Fatty Acids (FISH OIL) 1000 MG CAPS Take 1,000 mg by mouth every other day. In the evening, alternating with fenofibrate acid    [provider]  sertraline (ZOLOFT) 25 MG tablet Take 25 mg by mouth daily. 12/19/22 12/19/23  [provider]      Allergies    Adhesive [tape], Hydrochlorothiazide, Latex, and Vantin [cefpodoxime]    Review of Systems   Review of Systems  Respiratory:  Positive for shortness of breath.     Physical Exam Updated Vital Signs BP (!) 108/50   Pulse [name])  55   Temp 97.8 F (36.6 C) (Oral)   Resp (!) 23   Ht 6' (1.829 m)   Wt 93.9 kg   SpO2 100%   BMI 28.07 kg/m  Physical Exam Vitals and nursing note reviewed.  Constitutional:      Appearance: He is well-developed.  HENT:     Head: Normocephalic.     Mouth/Throat:     Mouth: Mucous membranes are moist.  Cardiovascular:     Rate and Rhythm: Normal rate and regular rhythm.  Pulmonary:     Effort: Pulmonary effort is normal.     Breath sounds: Normal breath sounds. No decreased breath sounds.     Comments: Good air movement.  Musculoskeletal:     Cervical back: Normal range of motion.     Right lower leg: Edema present.      Left lower leg: Edema present.     Comments: R>L LE edema  Skin:    General: Skin is warm and dry.     Findings: No erythema.  Neurological:     Mental Status: He is alert.     ED Results / Procedures / Treatments   Labs (all labs ordered are listed, but only abnormal results are displayed) Labs Reviewed  BASIC METABOLIC PANEL - Abnormal; Notable for the following components:      Result Value   Glucose, Bld 102 (*)    BUN 26 (*)    Creatinine, Ser 1.44 (*)    GFR, Estimated 49 (*)    All other components within normal limits  CBC - Abnormal; Notable for the following components:   RBC 4.05 (*)    Hemoglobin 10.5 (*)    HCT 34.1 (*)    MCH 25.9 (*)    RDW 16.2 (*)    All other components within normal limits  BRAIN NATRIURETIC PEPTIDE - Abnormal; Notable for the following components:   B Natriuretic Peptide 134.3 (*)    All other components within normal limits   Results for orders placed or performed during the hospital encounter of 01/12/23  Basic metabolic panel  Result Value Ref Range   Sodium 136 135 - 145 mmol/L   Potassium 4.0 3.5 - 5.1 mmol/L   Chloride 99 98 - 111 mmol/L   CO2 28 22 - 32 mmol/L   Glucose, Bld 102 (H) 70 - 99 mg/dL   BUN 26 (H) 8 - 23 mg/dL   Creatinine, Ser 9.60 (H) 0.61 - 1.24 mg/dL   Calcium 9.1 8.9 - 45.4 mg/dL   GFR, Estimated 49 (L) >60 mL/min   Anion gap 9 5 - 15  CBC  Result Value Ref Range   WBC 9.0 4.0 - 10.5 K/uL   RBC 4.05 (L) 4.22 - 5.81 MIL/uL   Hemoglobin 10.5 (L) 13.0 - 17.0 g/dL   HCT 09.8 (L) 11.9 - 14.7 %   MCV 84.2 80.0 - 100.0 fL   MCH 25.9 (L) 26.0 - 34.0 pg   MCHC 30.8 30.0 - 36.0 g/dL   RDW 82.9 (H) 56.2 - 13.0 %   Platelets 230 150 - 400 K/uL   nRBC 0.0 0.0 - 0.2 %  Brain natriuretic peptide  Result Value Ref Range   B Natriuretic Peptide 134.3 (H) 0.0 - 100.0 pg/mL    EKG None  Radiology DG Chest 2 View  Result Date: 01/12/2023 CLINICAL DATA:  82 year old male shortness of breath "feels like fluid  back up", lower extremity swelling. EXAM: CHEST - 2 VIEW COMPARISON:  CTA chest 01/04/2023 and earlier. FINDINGS: Upright AP and lateral views of the chest 1126 hours. Continued low lung volumes. Mild bibasilar atelectasis. Heart size remains within normal limits. No pneumothorax, pulmonary edema, pleural effusion, consolidation. Visualized tracheal air column is within normal limits. No acute osseous abnormality identified. Negative visible bowel gas. IMPRESSION: Low lung volumes, otherwise no acute cardiopulmonary abnormality. Electronically Signed   By: Odessa Fleming M.D.   On: 01/12/2023 11:33    Procedures Procedures    Medications Ordered in ED Medications - No data to display  ED Course/ Medical Decision Making/ A&P                                 Medical Decision Making This patient presents to the ED for concern of weakness, SOB, fluid overload, this involves an extensive number of treatment options, and is a complaint that carries with it a high risk of complications and morbidity.  The differential diagnosis includes CHF, PNA, renal dysfunction, electrolyte imbalance   Co morbidities that complicate the patient evaluation  CAD, oxygen dependent (recent), LE edema   Additional history obtained:  Additional history obtained from daughter External records from outside source obtained and reviewed including Cardiology office notes   Lab Tests:  I Ordered, and personally interpreted labs.  The pertinent results include:  Mild renal insufficiency  with Cr. 1.44, GFR 49 (has been elevated since 10/11 - normal prior to this), BNP 134. No WBC elevation to suggest infection. Mild anemia of 10.5.   Imaging Studies ordered:  I ordered imaging studies including CXR  I independently visualized and interpreted imaging which showed No infiltrates, consolidations or edema I agree with the radiologist interpretation   Cardiac Monitoring: / EKG:  The patient was maintained on a cardiac  monitor.  I personally viewed and interpreted the cardiac monitored which showed an underlying rhythm of: sinus bradycardia, 1st degree block - unchanged   Consultations Obtained:  I requested consultation with the cardiology,  and discussed lab and imaging findings as well as pertinent plan - they recommend: they will see him in consultation, medicine to admit   Problem List / ED Course / Critical interventions / Medication management  SOB - on 3L O2 at home since earlier in the month per pulmonology secondary to sOB, no history of COPD LE edema R>L - recent doppler negative CXR - no edema BNP 134 Mild renal dysfunction of 1.44  Social Determinants of Health:  Limited mobility   Test / Admission - Considered:  Sent from cardiology office for ED evaluation and consideration for admission. Cards consult requested. Admit to hospitalist.    Amount and/or Complexity of Data Reviewed Labs: ordered. Radiology: ordered.           Final Clinical Impression(s) / ED Diagnoses Final diagnoses:  SOB (shortness of breath)  Renal insufficiency  Hypotension, unspecified hypotension type    Rx / DC Orders ED Discharge Orders     None         Elpidio Anis, PA-C 01/12/23 1553    Margarita Grizzle, MD 01/15/23 1226

## 2023-01-12 NOTE — ED Notes (Signed)
I called 3e  to see if pt could come up now  I was told still in shift change wait for about 10 minutes

## 2023-01-12 NOTE — ED Notes (Signed)
Received clearance to bring this pt up to 3e

## 2023-01-12 NOTE — ED Notes (Signed)
Admitting doctor at  the bedside  daughter is beside the pt  the pt is hard of hearing

## 2023-01-12 NOTE — ED Triage Notes (Addendum)
Pt states he feels like he has fluid buildup that has been ongoing.  Bilateral ankles are swollen. Pt states he feels short of breath. Pt states he has been in and out of hospital for same recently. Pt denies pain.

## 2023-01-12 NOTE — ED Notes (Signed)
To x-ray

## 2023-01-12 NOTE — Consult Note (Signed)
Cardiology Consultation:  Patient ID: Jeffrey Acevedo MRN: [id number]; DOB: 1940-08-15  Admit date: 01/12/2023 Date of Consult: 01/12/2023  Primary Care Provider: Gordan Acevedo., MD Primary Cardiologist: Jeffrey Batty, MD  Primary Electrophysiologist:  None   Patient Profile:  Jeffrey Acevedo is a 82 y.o. male with a hx of HFpEF, CAD,CKD IIIa, hypertension who is being seen today for the evaluation of lower extremity edema at the request of Jeffrey Cord, MD.  History of Present Illness:  Jeffrey Acevedo presents with worsening lower extremity edema over the past 3 to 4 days.  He reports right lower extremity edema more than left.  Recently admitted to the hospital between 01/03/2023 and 01/06/2023 for acute diastolic heart failure.  Before that he was admitted on 12/29/2022 for unstable angina and underwent PCI to the mid LAD.  He has struggled with hypoxia which was noted during his hospitalization on 12/29/2022.  His echocardiogram is notable for an intracavitary gradient to 54 mmHg.  On my review this appears to be chordal systolic anterior motion.  It seems to be intracavitary obstruction.  He has no posteriorly directed mitral valve regurgitation to suspect mitral valve systolic anterior motion.  His LV is hyperdynamic.  I do not believe this represents hypertrophic cardiomyopathy.  He did undergo right heart catheterization on 12/29/2022.  That showed a pulmonary capillary wedge pressure of 7 mmHg.  Mean PA pressure 23 mmHg.  LVEDP reported to be 18 mmHg.  He now presents again with edema.  Seen in our office today.  Blood pressures were marginal.  He was then sent for further evaluation in the ER.  His shortness of breath is never improved.  Chest x-ray shows no pulmonary edema and lungs are clear.  He does have a murmur on examination.  2+ pitting edema in the right lower extremity up to the knee but trace edema in the left lower extremity edema.  Vitals notable for BP 110/61.  Pulse 55.   Serum creatinine 1.44.  eGFR 49.  BNP 134.  Hemoglobin 10.5.  He does report having right leg surgery including knee surgery.  I suspect this is the etiology for his edema that is asymmetric.  DVT study on 01/03/2023 showed no evidence of DVT.  Heart Pathway Score:       Past Medical History: Past Medical History:  Diagnosis Date   Cancer [facility])    prostate cancer surgery 2011   Chronic diastolic heart failure (HCC)    Coronary artery disease    sees Dr. Erlene Quan 3 mths ago.  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   Hyperlipidemia    Hypertension     Past Surgical History: Past Surgical History:  Procedure Laterality Date   CARDIAC CATHETERIZATION  06/17/2009   Proximal LAD 90% stenosis just beyond previously stented segment with a Multi-Link Vision bare-metal stent   CORONARY ANGIOPLASTY     stents placed in 1998 and 2011    CORONARY PRESSURE/FFR STUDY N/A 05/02/2021   Procedure: INTRAVASCULAR PRESSURE WIRE/FFR STUDY;  Surgeon: Runell Gess, MD;  Location: [facility];  Service: Cardiovascular;  Laterality: N/A;   CORONARY PRESSURE/FFR STUDY N/A 09/19/2021   Procedure: INTRAVASCULAR PRESSURE WIRE/FFR STUDY;  Surgeon: Runell Gess, MD;  Location: [facility];  Service: Cardiovascular;  Laterality: N/A;  RAMUS  CORONARY STENT INTERVENTION N/A 05/02/2021   Procedure: CORONARY STENT INTERVENTION;  Surgeon: Runell Gess, MD;  Location: [facility];  Service: Cardiovascular;  Laterality: N/A;   CORONARY STENT INTERVENTION N/A 09/19/2021   Procedure: CORONARY STENT INTERVENTION;  Surgeon: Runell Gess, MD;  Location: [facility];  Service: Cardiovascular;  Laterality: N/A;   CORONARY STENT INTERVENTION N/A 12/29/2022   Procedure: CORONARY STENT INTERVENTION;  Surgeon: Kathleene Hazel, MD;  Location: [facility];   Service: Cardiovascular;  Laterality: N/A;   LEFT HEART CATH AND CORONARY ANGIOGRAPHY N/A 05/02/2021   Procedure: LEFT HEART CATH AND CORONARY ANGIOGRAPHY;  Surgeon: Runell Gess, MD;  [facility]: [facility];  Service: Cardiovascular;  Laterality: N/A;   LEFT HEART CATH AND CORONARY ANGIOGRAPHY N/A 09/19/2021   Procedure: LEFT HEART CATH AND CORONARY ANGIOGRAPHY;  Surgeon: Runell Gess, MD;  Location: [facility];  Service: Cardiovascular;  Laterality: N/A;   NASAL SEPTUM SURGERY     Dr. Richardson Landry @ [facility]  2014   [facility]  2011   RIGHT/LEFT HEART CATH AND CORONARY ANGIOGRAPHY N/A 12/29/2022   Procedure: RIGHT/LEFT HEART CATH AND CORONARY ANGIOGRAPHY;  Surgeon: Kathleene Hazel, MD;  [facility]: [facility];  Service: Cardiovascular;  Laterality: N/A;   ROBOT ASSISTED LAPAROSCOPIC RADICAL PROSTATECTOMY     TOTAL HIP ARTHROPLASTY Right 04/19/2015   Procedure: RIGHT TOTAL HIP ARTHROPLASTY ANTERIOR APPROACH;  Surgeon: Jodi Geralds, MD;  Location: [facility];  Service: Orthopedics;  Laterality: Right;     Home Medications:  Prior to Admission medications   Medication Sig Start Date End Date Taking? Authorizing Provider  aspirin EC 81 MG EC tablet Take 1 tablet (81 mg total) by mouth daily. Swallow whole. 05/04/21  Yes Arty Baumgartner, NP  Choline Fenofibrate (FENOFIBRIC ACID) 45 MG CPDR Take 1 capsule by mouth once daily Patient taking differently: Take 45 mg by mouth every other day. 11/02/22  Yes Runell Gess, MD  clopidogrel (PLAVIX) 75 MG tablet Take 1 tablet (75 mg total) by mouth daily. 12/30/22  Yes Meng, Wynema Birch, PA  Cyanocobalamin (VITAMIN B-12 PO) Take 2,500 mcg by mouth daily.   Yes [provider]  empagliflozin (JARDIANCE) 10 MG TABS tablet Take 1 tablet (10 mg total) by mouth daily. 01/07/23 02/06/23 Yes Arrien, York Ram, MD  fluticasone [facility]) 50 MCG/ACT nasal spray Place 1 spray into both nostrils daily.   Yes [provider]  furosemide (LASIX) 40 MG tablet Take 1 tablet (40 mg total) by mouth daily. Take twice daily in case of weight gain 2 to 3 lbs in 24 hrs or 5 lbs in 7 days. 01/06/23 04/06/23 Yes Arrien, York Ram, MD  isosorbide mononitrate (IMDUR) 30 MG 24 hr tablet Take 30 mg by mouth daily. 12/11/22  Yes [provider]  Magnesium Citrate 200 MG TABS Take 400 mg by mouth daily. 12/13/22  Yes Duke, Roe Rutherford, PA  metoprolol tartrate (LOPRESSOR) 25 MG tablet Take 1 tablet (25 mg total) by mouth 2 (two) times daily. 12/30/22  Yes Azalee Course, PA  nitroGLYCERIN (NITROSTAT) 0.4 MG SL tablet Place 1 tablet (0.4 mg total) under the tongue every 5 (five) minutes as needed for chest pain. 11/14/22  Yes Runell Gess, MD  Omega-3 Fatty Acids (FISH OIL) 1000 MG CAPS Take 1,000 mg by mouth every other day. In the evening, alternating with fenofibrate acid   Yes [provider]  sertraline (ZOLOFT) 25 MG tablet Take 25 mg by mouth daily. 12/19/22 12/19/23 Yes [provider]    Inpatient Medications: Scheduled Meds:  Continuous Infusions:  PRN Meds:   Allergies:    Allergies  Allergen Reactions   Adhesive [Tape] Other (See Comments)    Mainly just redness.   Has been ruled out for latex allegery   Hydrochlorothiazide Other (See Comments)    H/o Pancreatitis, takes in Avalide at home   Latex Other (See Comments)    unknown   Vantin [Cefpodoxime] Rash    Social History:   Social History   Socioeconomic History   Marital status: Married    Spouse name: Not on file   Number of children: Not on file   Years of education: Not on file   Highest education level: Not on file  Occupational History   Not on file  Tobacco Use   Smoking status: Never   Smokeless tobacco: Never  Substance and Sexual Activity   Alcohol use: No   Drug use: No   Sexual activity: Not on file  Other Topics Concern   Not on file  Social History Narrative   Not on file   Social  Determinants of Health   Financial Resource Strain: Not on file  Food Insecurity: No Food Insecurity (01/03/2023)   Hunger Vital Sign    Worried About Running Out of Food in the Last Year: Never true    Ran Out of Food in the Last Year: Never true  Transportation Needs: No Transportation Needs (01/03/2023)   PRAPARE - Administrator, Civil Service (Medical): No    Lack of Transportation (Non-Medical): No  Recent Concern: Transportation Needs - Unmet Transportation Needs (12/12/2022)   Received from [employer]    In the past 12 months, has lack of reliable transportation kept you from medical appointments, meetings, work or from getting things needed for daily living? : Yes  Physical Activity: Not on file  Stress: Not on file  Social Connections: Not on file  Intimate Partner Violence: Not At Risk (01/03/2023)   Humiliation, Afraid, Rape, and Kick questionnaire    Fear of Current or Ex-Partner: No    Emotionally Abused: No    Physically Abused: No    Sexually Abused: No     Family History:    Family History  Problem Relation Age of Onset   Hypertension Mother    Heart disease Father    Hypertension Father      ROS:  All other ROS reviewed and negative. Pertinent positives noted in the HPI.     Physical Exam/Data:   Vitals:   01/12/23 1515 01/12/23 1530 01/12/23 1545 01/12/23 1611  BP:  130/63 110/61   Pulse:  60 (!) 55   Resp:  (!) 25 (!) 24   Temp: 97.8 F (36.6 C)   98 F (36.7 C)  TempSrc: Oral     SpO2:  97% 90%   Weight:      Height:       No intake or output data in the 24 hours ending 01/12/23 1613     01/12/2023   11:00 AM 01/12/2023    8:57 AM 01/06/2023    3:36 AM  Last 3 Weights  Weight (lbs) 207 lb 207 lb 202 lb 2.6 oz  Weight (kg) 93.895 kg 93.895 kg 91.7 kg    Body mass index is 28.07 kg/m.  General: Well nourished, well developed, in no acute  distress Head: Atraumatic, normal size  Eyes: PEERLA, EOMI  Neck:  Supple, no JVD Endocrine: No thryomegaly Cardiac: Normal S1, S2; RRR; 2/6 SEM Lungs: Clear to auscultation bilaterally, no wheezing, rhonchi or rales  Abd: Soft, nontender, no hepatomegaly  Ext: 2+ pitting edema in the right lower extremity, trace in the left lower extremity Musculoskeletal: No deformities, BUE and BLE strength normal and equal Skin: Warm and dry, no rashes   Neuro: Alert and oriented to person, place, time, and situation, CNII-XII grossly intact, no focal deficits  Psych: Normal mood and affect   EKG:  The EKG was personally reviewed and demonstrates:  SB 56 bpm, 1AVB Telemetry:  Telemetry was personally reviewed and demonstrates:  SB 50s  Relevant CV Studies: TTE 01/04/2023  1. Left ventricular ejection fraction, by estimation, is 65 to 70%. The  left ventricle has hyperdynamic function. The left ventricle has no  regional wall motion abnormalities. There is moderate asymmetric left  ventricular hypertrophy of the septal  segment. LV outflow tract gradient peak 54 mmHg. Left ventricular  diastolic parameters are consistent with Grade I diastolic dysfunction  (impaired relaxation).   2. Right ventricular systolic function is normal. The right ventricular  size is normal. There is normal pulmonary artery systolic pressure. The  estimated right ventricular systolic pressure is 27.2 mmHg.   3. Left atrial size was mildly dilated.   4. There is mitral chordal and probably valvular systolic anterior  motion. The mitral valve is abnormal. Trivial mitral valve regurgitation.  No evidence of mitral stenosis.   5. The aortic valve is tricuspid. Aortic valve regurgitation is not  visualized.   6. Aortic dilatation noted. There is mild dilatation of the aortic root,  measuring 38 mm.   7. The inferior vena cava is dilated in size with <50% respiratory  variability, suggesting right atrial pressure of 15 mmHg.   8. Findings consistent with HOCM.   Laboratory Data: High  Sensitivity Troponin:  No results for input(s): "TROPONINIHS" in the last 720 hours.   Cardiac EnzymesNo results for input(s): "TROPONINI" in the last 168 hours. No results for input(s): "TROPIPOC" in the last 168 hours.  Chemistry Recent Labs  Lab 01/06/23 0240 01/12/23 1104  NA 133* 136  K 4.6 4.0  CL 95* 99  CO2 27 28  GLUCOSE 100* 102*  BUN 28* 26*  CREATININE 1.58* 1.44*  CALCIUM 9.1 9.1  GFRNONAA 44* 49*  ANIONGAP 11 9    No results for input(s): "PROT", "ALBUMIN", "AST", "ALT", "ALKPHOS", "BILITOT" in the last 168 hours. Hematology Recent Labs  Lab 01/12/23 1104  WBC 9.0  RBC 4.05*  HGB 10.5*  HCT 34.1*  MCV 84.2  MCH 25.9*  MCHC 30.8  RDW 16.2*  PLT 230   BNP Recent Labs  Lab 01/12/23 1104  BNP 134.3*    DDimer No results for input(s): "DDIMER" in the last 168 hours.  Radiology/Studies:  DG Chest 2 View  Result Date: 01/12/2023 CLINICAL DATA:  82 year old male shortness of breath "feels like fluid back up", lower extremity swelling. EXAM: CHEST - 2 VIEW COMPARISON:  CTA chest 01/04/2023 and earlier. FINDINGS: Upright AP and lateral views of the chest 1126 hours. Continued low lung volumes. Mild bibasilar atelectasis. Heart size remains within normal limits. No pneumothorax, pulmonary edema, pleural effusion, consolidation. Visualized tracheal air column is within normal limits. No acute osseous abnormality identified. Negative visible bowel gas. IMPRESSION: Low lung volumes, otherwise no acute cardiopulmonary abnormality. Electronically Signed   By: Rexene Edison  Margo Aye M.D.   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  Recent DVT study on 01/03/2023 negative for DVT.  He describes having surgery in his right leg several years ago.  I suspect this could explain the  asymmetric edema. -Chest x-ray is clear.  He really has minimal edema in the left lower extremity.  His BNP is marginally elevated at 134. -Suspect this is more of a venous insufficiency issue.  Regardless would recommend Lasix 40 mg IV twice daily.  He also should have compression stockings on his lower extremities as this will help. -I do not have a great explanation for his shortness of breath.  His chest x-ray is clear.  His lungs are clear.  He did have a right heart catheterization on 12/29/2022.  Pulmonary wedge pressure was 7 mmHg.  Mean PA pressure 23 mmHg.  Really not that impressive.  Exam is also consistent with normal PA pressures. -I also am uncertain of his diagnosis of hypertrophic cardiomyopathy.  To me this is a hyperdynamic LV with intracavitary obstruction.  He has no posteriorly directed MR. -Would recommend to hold home Imdur.  This would not be good for him in the setting of outflow tract obstruction.  We will hold his beta-blocker given his marginal blood pressures.  He reports no chest pain.  Okay to continue Jardiance. -He describes getting meals that are delivered to him and eating in restaurants.  All of his diet comes from out of the house.  I suspect it is high in salt content. -No need to repeat echo.  # CAD status post PCI to mid LAD -Recent admission for unstable angina earlier this month.  Status post PCI to the proximal to mid LAD.  Continue aspirin and Plavix.  For questions or updates, please contact [facility] Please consult [email or web address] for contact info under   Signed, Gerri Spore T. Flora Lipps, MD, [facility] [place]  [facility]  01/12/2023 4:13 PM

## 2023-01-12 NOTE — Patient Instructions (Signed)
Medication Instructions:  Your physician recommends that you continue on your current medications as directed. Please refer to the Current Medication list given to you today.   *If you need a refill on your cardiac medications before your next appointment, please call your pharmacy*   Lab Work: NONE ordered at this time of appointment   Testing/Procedures: NONE ordered at this time of appointment   Follow-Up: At [facility], you and your health needs are our priority.  As part of our continuing mission to provide you with exceptional heart care, we have created designated Provider Care Teams.  These Care Teams include your primary Cardiologist (physician) and Advanced Practice Providers (APPs -  Physician Assistants and Nurse Practitioners) who all work together to provide you with the care you need, when you need it.  We recommend signing up for the patient portal called "MyChart".  Sign up information is provided on this After Visit Summary.  MyChart is used to connect with patients for Virtual Visits (Telemedicine).  Patients are able to view lab/test results, encounter notes, upcoming appointments, etc.  Non-urgent messages can be sent to your provider as well.   To learn more about what you can do with MyChart, go to [email or web address].    Your next appointment:    Follow up post ED visit   Provider:   Dr. Allyson Sabal or Any APP    Other Instructions ED evaluation encouraged.

## 2023-01-12 NOTE — H&P (Signed)
History and Physical   Jeffrey Acevedo AVW:[id number] DOB: 1940/11/24 DOA: 01/12/2023  PCP: [name], MD   Patient coming from: Cardiologist office  Chief Complaint: Shortness of breath, volume overload  HPI: Jeffrey Acevedo is a 82 y.o. male with medical history significant of hypertension, hyperlipidemia, CAD status post stents, CKD 2, diastolic CHF presenting with shortness of breath, edema.  Patient is presenting from cardiologist office with concern for worsening shortness of breath, new oxygen requirement, edema, weight gain and hypertension.  Patient underwent successful stent placement for CAD on 10/11.  Was admitted from 10/16 until 10/19 for AKI, acute on chronic diastolic CHF, acute respiratory failure.  Patient was diuresed about 5 pounds with a discharge weight of a little 192 EKGs.  SGLT2 was added and consideration of spironolactone if blood pressure tolerated.  During admission patient was negative for DVT checked given his asymmetrical edema and also had negative CTA PE study.  During outpatient follow-up he was noted to be hypoxic and has been started on outpatient oxygen therapy.  Currently on 3 L.  During his cardiology follow-up today he has had continued shortness of breath, worsening creatinine, continue oxygen requirement and not good candidate for outpatient diuresis given he has hypotension to low normal blood pressure outpatient.  Referred to the ED for further evaluation and treatment.  Denies fevers, chills, chest pain, abdominal pain, constipation, diarrhea, nausea, vomiting.  ED Course: Vital signs in ED notable for blood pressure in the 90s to 110 systolic, heart rate in the 50s, requiring 3 L to maintain saturations.  BMP showed BUN 26, creatinine of 1.44 elevated from baseline of 1.1-1.2, glucose 102.  BNP remains indeterminate at 134 but similar to previous.  CBC with hemoglobin stable at 10.5.  Chest x-ray with low lung volumes but otherwise no acute  normality.  Cardiology consulted and**  Review of Systems: As per HPI otherwise all other systems reviewed and are negative.  Past Medical History:  Diagnosis Date   Acute on chronic diastolic CHF (congestive heart failure) (HCC) 01/03/2023   Cancer (HCC)    prostate cancer surgery 2011   Chronic diastolic heart failure (HCC)    Coronary artery disease    sees Dr. Erlene Quan 3 mths ago.  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   Hyperlipidemia    Hypertension     Past Surgical History:  Procedure Laterality Date   CARDIAC CATHETERIZATION  06/17/2009   Proximal LAD 90% stenosis just beyond previously stented segment with a Multi-Link Vision bare-metal stent   CORONARY ANGIOPLASTY     stents placed in 1998 and 2011    CORONARY PRESSURE/FFR STUDY N/A 05/02/2021   Procedure: INTRAVASCULAR PRESSURE WIRE/FFR STUDY;  Surgeon: Runell Gess, MD;  Location: [facility];  Service: Cardiovascular;  Laterality: N/A;   CORONARY PRESSURE/FFR STUDY N/A 09/19/2021   Procedure: INTRAVASCULAR PRESSURE WIRE/FFR STUDY;  Surgeon: Runell Gess, MD;  Location: [facility];  Service: Cardiovascular;  Laterality: N/A;  RAMUS   CORONARY STENT INTERVENTION N/A 05/02/2021   Procedure: CORONARY STENT INTERVENTION;  Surgeon: Runell Gess, MD;  Location: [facility];  Service: Cardiovascular;  Laterality: N/A;   CORONARY STENT INTERVENTION N/A 09/19/2021   Procedure: CORONARY STENT INTERVENTION;  Surgeon: Runell Gess, MD;  Location: [facility];  Service: Cardiovascular;  Laterality: N/A;   CORONARY STENT INTERVENTION N/A 12/29/2022   Procedure: CORONARY STENT INTERVENTION;  Surgeon: Kathleene Hazel, MD;  Location: [facility];  Service: Cardiovascular;  Laterality: N/A;   LEFT HEART CATH AND CORONARY ANGIOGRAPHY N/A 05/02/2021   Procedure: LEFT  HEART CATH AND CORONARY ANGIOGRAPHY;  Surgeon: Runell Gess, MD;  Location: [facility];  Service: Cardiovascular;  Laterality: N/A;   LEFT HEART CATH AND CORONARY ANGIOGRAPHY N/A 09/19/2021   Procedure: LEFT HEART CATH AND CORONARY ANGIOGRAPHY;  Surgeon: Runell Gess, MD;  Location: [facility];  Service: Cardiovascular;  Laterality: N/A;   NASAL SEPTUM SURGERY     Dr. Richardson Landry @ [facility]  2014   [facility]  2011   RIGHT/LEFT HEART CATH AND CORONARY ANGIOGRAPHY N/A 12/29/2022   Procedure: RIGHT/LEFT HEART CATH AND CORONARY ANGIOGRAPHY;  Surgeon: Kathleene Hazel, MD;  [facility]: [facility];  Service: Cardiovascular;  Laterality: N/A;   ROBOT ASSISTED LAPAROSCOPIC RADICAL PROSTATECTOMY     TOTAL HIP ARTHROPLASTY Right 04/19/2015   Procedure: RIGHT TOTAL HIP ARTHROPLASTY ANTERIOR APPROACH;  Surgeon: Jodi Geralds, MD;  Location: [facility];  Service: Orthopedics;  Laterality: Right;    Social History  reports that he has never smoked. He has never used smokeless tobacco. He reports that he does not drink alcohol and does not use drugs.  Allergies  Allergen Reactions   Adhesive [Tape] Other (See Comments)    Mainly just redness.   Has been ruled out for latex allegery   Hydrochlorothiazide Other (See Comments)    H/o Pancreatitis, takes in Avalide at home   Latex Other (See Comments)    unknown   Vantin [Cefpodoxime] Rash    Family History  Problem Relation Age of Onset   Hypertension Mother    Heart disease Father    Hypertension Father   Reviewed on admission  Prior to Admission medications   Medication Sig Start Date End Date Taking? Authorizing Provider  aspirin EC 81 MG EC tablet Take 1 tablet (81 mg total) by mouth daily. Swallow whole. 05/04/21  Yes Arty Baumgartner, NP  Choline Fenofibrate (FENOFIBRIC ACID) 45 MG CPDR Take 1 capsule by mouth once daily Patient taking differently: Take 45 mg by mouth every other day. 11/02/22  Yes  Runell Gess, MD  clopidogrel (PLAVIX) 75 MG tablet Take 1 tablet (75 mg total) by mouth daily. 12/30/22  Yes Meng, Wynema Birch, PA  Cyanocobalamin (VITAMIN B-12 PO) Take 2,500 mcg by mouth daily.   Yes [provider]  empagliflozin (JARDIANCE) 10 MG TABS tablet Take 1 tablet (10 mg total) by mouth daily. 01/07/23 02/06/23 Yes Arrien, York Ram, MD  fluticasone [facility]) 50 MCG/ACT nasal spray Place 1 spray into both nostrils daily.   Yes [provider]  furosemide (LASIX) 40 MG tablet Take 1 tablet (40 mg total) by mouth daily. Take twice daily in case of weight gain 2 to 3 lbs in 24 hrs or 5 lbs in 7 days. 01/06/23 04/06/23 Yes Arrien, York Ram, MD  isosorbide mononitrate (IMDUR) 30 MG 24 hr tablet Take 30 mg by mouth daily. 12/11/22  Yes [provider]  Magnesium Citrate 200 MG TABS Take 400 mg by mouth daily. 12/13/22  Yes Duke, Roe Rutherford, PA  metoprolol tartrate (LOPRESSOR) 25 MG tablet Take 1 tablet (25 mg total) by mouth 2 (two) times daily. 12/30/22  Yes Azalee Course, PA  nitroGLYCERIN (NITROSTAT)  0.4 MG SL tablet Place 1 tablet (0.4 mg total) under the tongue every 5 (five) minutes as needed for chest pain. 11/14/22  Yes Runell Gess, MD  Omega-3 Fatty Acids (FISH OIL) 1000 MG CAPS Take 1,000 mg by mouth every other day. In the evening, alternating with fenofibrate acid   Yes [provider]  sertraline (ZOLOFT) 25 MG tablet Take 25 mg by mouth daily. 12/19/22 12/19/23 Yes [provider]    Physical Exam: Vitals:   01/12/23 1515 01/12/23 1530 01/12/23 1545 01/12/23 1611  BP:  130/63 110/61   Pulse:  60 (!) 55   Resp:  (!) 25 (!) 24   Temp: 97.8 F (36.6 C)   98 F (36.7 C)  TempSrc: Oral     SpO2:  97% 90%   Weight:      Height:        Physical Exam Constitutional:      General: He is not in acute distress.    Appearance: Normal appearance.  HENT:     Head: Normocephalic and atraumatic.     Mouth/Throat:      Mouth: Mucous membranes are moist.     Pharynx: Oropharynx is clear.  Eyes:     Extraocular Movements: Extraocular movements intact.     Pupils: Pupils are equal, round, and reactive to light.  Cardiovascular:     Rate and Rhythm: Normal rate and regular rhythm.     Pulses: Normal pulses.     Heart sounds: Normal heart sounds.  Pulmonary:     Effort: Pulmonary effort is normal. No respiratory distress.     Breath sounds: Normal breath sounds.  Abdominal:     General: Bowel sounds are normal. There is no distension.     Palpations: Abdomen is soft.     Tenderness: There is no abdominal tenderness.  Musculoskeletal:        General: No swelling or deformity.     Right lower leg: Edema present.     Left lower leg: Edema present.  Skin:    General: Skin is warm and dry.  Neurological:     General: No focal deficit present.     Mental Status: Mental status is at baseline.    Labs on Admission: I have personally reviewed following labs and imaging studies  CBC: Recent Labs  Lab 01/12/23 1104  WBC 9.0  HGB 10.5*  HCT 34.1*  MCV 84.2  PLT 230    Basic Metabolic Panel: Recent Labs  Lab 01/06/23 0240 01/12/23 1104  NA 133* 136  K 4.6 4.0  CL 95* 99  CO2 27 28  GLUCOSE 100* 102*  BUN 28* 26*  CREATININE 1.58* 1.44*  CALCIUM 9.1 9.1    GFR: Estimated Creatinine Clearance: 47.9 mL/min (A) (by C-G formula based on SCr of 1.44 mg/dL (H)).  Liver Function Tests: No results for input(s): "AST", "ALT", "ALKPHOS", "BILITOT", "PROT", "ALBUMIN" in the last 168 hours.  Urine analysis:    Component Value Date/Time   COLORURINE YELLOW 01/04/2023 0040   APPEARANCEUR CLEAR 01/04/2023 0040   LABSPEC 1.010 01/04/2023 0040   PHURINE 5.0 01/04/2023 0040   GLUCOSEU NEGATIVE 01/04/2023 0040   HGBUR MODERATE (A) 01/04/2023 0040   BILIRUBINUR NEGATIVE 01/04/2023 0040   KETONESUR NEGATIVE 01/04/2023 0040   PROTEINUR NEGATIVE 01/04/2023 0040   NITRITE NEGATIVE 01/04/2023 0040    LEUKOCYTESUR NEGATIVE 01/04/2023 0040    Radiological Exams on Admission: DG Chest 2 View  Result Date: 01/12/2023 CLINICAL DATA:  82 year old male shortness of breath "feels like fluid back up", lower extremity swelling. EXAM: CHEST - 2 VIEW COMPARISON:  CTA chest 01/04/2023 and earlier. FINDINGS: Upright AP and lateral views of the chest 1126 hours. Continued low lung volumes. Mild bibasilar atelectasis. Heart size remains within normal limits. No pneumothorax, pulmonary edema, pleural effusion, consolidation. Visualized tracheal air column is within normal limits. No acute osseous abnormality identified. Negative visible bowel gas. IMPRESSION: Low lung volumes, otherwise no acute cardiopulmonary abnormality. Electronically Signed   By: Odessa Fleming M.D.   On: 01/12/2023 11:33    EKG: Independently reviewed. Sinus bradycardia with first-degree block.    Assessment/Plan Active Problems:   Acute kidney injury superimposed on chronic kidney disease (HCC)   CAD S/P percutaneous coronary angioplasty   Essential hypertension   Hyperlipidemia   Coronary artery disease   Status post coronary artery stent placement   Chronic hypoxic respiratory failure (HCC)   Acute on chronic diastolic (congestive) heart failure (HCC)   A/C dCHF AKI Chronic respiratory failure > Known CHF recent admission for exacerbation earlier this month with 5 pounds diuresis and echo showing EF 65-70%, G1 DD, normal RV function. > Is on chronic 3 L supplemental oxygen but etiology of oxygen requirement is unclear. > Has regained 5 pounds, now with some shortness of breath. Complicated by worsening creatinine and low normal to hypotensive blood pressures outpatient. > Seen in outpatient cardiology visit today for the above and given his blood pressure and creatinine was not a good candidate for continued outpatient diuresis and was asked to present to the ED for further evaluation and treatment. > Significant edema on exam but  chest x-ray looks okay and BNP remains stable at around 134 this is similar to previous exacerbation; though this could be more venous insufficiency related. > Cardiology consulted in the ED. - Monitor on progressive unit in case inotropes are needed - Prescient cardiology recommendations and assistance - Lasix 40 IV BID, as reccommended by cardiology - Strict I's and O's, daily weights - Check magnesium - Trend renal function and electrolytes - Holding Imdur, metoprolol - Continue Jardiance - Compression stockings - Consult to dietitian for education on low salt options and reiterate fluid restriction  AKI/CKD 2 > Creatinine has remained elevated at around 1.4-1.7 recently from baseline that was previously around 1.1. - Trend renal function and electrolytes  Hypertension - Holding antihypertensives in the setting of recent hypotension/low normal blood pressure  CAD > Recent stent placement. - Continue aspirin, Plavix, fenofibrate - Holding metoprolol, Imdur  Hyperlipidemia - Continue fenofibrate  DVT prophylaxis: Lovenox Code Status:   Full Family Communication:  Updated at bedside  Disposition Plan:   Patient is from:  Home  Anticipated DC to:  Home  Anticipated DC date:  1 to 5 days  Anticipated DC barriers: None  Consults called:  Cardiology Admission status:  Observation, progressive  Severity of Illness: The appropriate patient status for this patient is OBSERVATION. Observation status is judged to be reasonable and necessary in order to provide the required intensity of service to ensure the patient's safety. The patient's presenting symptoms, physical exam findings, and initial radiographic and laboratory data in the context of their medical condition is felt to place them at decreased risk for further clinical deterioration. Furthermore, it is anticipated that the patient will be medically stable for discharge from the hospital within 2 midnights of admission.     Synetta Fail MD [facility]  How to contact the [facility] Attending  or Consulting provider 7A - 7P or covering provider during after hours 7P -7A, for this patient?   Check the care team in [facility] and look for a) attending/consulting [facility] provider listed and b) the [facility] team listed Log into [email or web address] and use [place]'s universal password to access. If you do not have the password, please contact the hospital operator. Locate the [facility] provider you are looking for under [facility] and page to a number that you can be directly reached. If you still have difficulty reaching the provider, please page the [facility] (Director on Call) for the Hospitalists listed on amion for assistance.  01/12/2023, 4:29 PM

## 2023-01-12 NOTE — Addendum Note (Signed)
Addended by: Denyce Robert on: 01/12/2023 04:52 PM   Modules accepted: Level of Service

## 2023-01-12 NOTE — Consult Note (Signed)
Cardiology Consultation   Patient ID: [name] MRN: [id number]; DOB: 1940/07/21  Admit date: 01/12/2023 Date of Consult: 01/12/2023  PCP:  Jeffrey Acevedo., MD   [facility] Providers Cardiologist:  Jeffrey Batty, MD        Patient Profile:   Jeffrey Acevedo is a 82 y.o. male with a hx of hypertension, hyperlipidemia, CAD, DHF who is being seen 01/12/2023 for the evaluation of acute on chronic diastolic heart failure at the request of [facility] hospitalists.  History of Present Illness:   Jeffrey Acevedo Jeffrey Acevedo is a 82 year old male with a history of coronary artery disease status post LAD stenting with bare-metal stent 03/1996.  Myoview 04/2009, normal without ischemia.  On 05/2009 he had a heart cath revealing a patent proximal LAD stent with 90% stenosis beyond, multi-link vision bare-metal stent was placed.  He did have a Lexiscan Myoview stress test on 03/2015, normal without ischemia.  On 05/02/2021 he had a another heart cath revealing patent proximal and mid LAD stents with a 70% proximal to mid LAD stenosis.  A drug-eluting stent was placed.  It was noted he continued to have nitrate responsive chest pain, he had a another cath on 09/19/21 where he had another stent placed to his proximal PDA.  He was seen by Dr. Allyson Sabal on 12/27/2022 where he described progressive anginal symptoms for the past 6 weeks.  He had a another cardiac catheterization on 12/29/2022 that demonstrated 40% proximal to mid left circumflex lesion, 30% ostial to proximal RCA lesion, 60% ramus lesion, 90% mid-1 LAD lesion treated with DES.  He did have a patent proximal to mid LAD stent.  Previously placed proximal LAD to mid 80s stent widely patent, previously placed mid LAD 3 stent widely patent, and patent PDA stent without restenosis.    Following his procedure he was admitted into the hospital on 01/03/2023 and discharged 01/06/2023 with principal problem of acute on chronic diastolic heart failure.  He  reported 3 days of worsening shortness of breath associated with lower extremity edema.  On day of his admission he was evaluated outpatient pulmonary clinic where he was found to be volume overloaded and sent to the ED.  BNP 136.8. Echocardiogram on admission showed preserved LV systolic function with an EF 65 to 70%, moderate asymmetric left ventricular hypertrophy.  He was diuresed with IV Lasix.  Lost a total of 3 kg during admission. Started on Jardiance during this admission, spironolactone was held off due to kidney function. Amlodipine discontinued due to hypotension. He is sent home on supplemental oxygen.    Today:   Mr. Scull arrives in clinic today with his daughter with complaints of worsening shortness of breath, weight gain.  He has been taking his medication as prescribed. He is now back up to 207 pounds which was his preadmission weight for his heart failure exacerbation.  He is experiencing periorbital edema, 3+ lower leg edema (R >L), abdominal distention.  He reports adherence to low-salt diet and fluid restriction at home.  He is on 2 to 4 L oxygen at baseline, current SpO2 at 93%.  He notes generalized weakness and severe DOE just walking around his home.  Of note he is hypotensive in clinic initial reading of 80/60, repeat 96/60.  He is pale and currently lightheaded.  His daughter is concerned about his current symptoms.   Wt Readings from Last 3 Encounters:  01/12/23 207 lb (93.9 kg)  01/06/23 202 lb 2.6 oz (91.7 kg)  01/03/23 207 lb 9.6 oz (94.2 kg)    Past Medical History:  Diagnosis Date   Acute on chronic diastolic CHF (congestive heart failure) (HCC) 01/03/2023   Cancer (HCC)    prostate cancer surgery 2011   Chronic diastolic heart failure (HCC)    Coronary artery disease    sees Dr. Erlene Acevedo 3 mths ago.  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    Hyperlipidemia    Hypertension     Past Surgical History:  Procedure Laterality Date   CARDIAC CATHETERIZATION  06/17/2009   Proximal LAD 90% stenosis just beyond previously stented segment with a Multi-Link Vision bare-metal stent   CORONARY ANGIOPLASTY     stents placed in 1998 and 2011    CORONARY PRESSURE/FFR STUDY N/A 05/02/2021   Procedure: INTRAVASCULAR PRESSURE WIRE/FFR STUDY;  Surgeon: Runell Gess, MD;  Location: [facility];  Service: Cardiovascular;  Laterality: N/A;   CORONARY PRESSURE/FFR STUDY N/A 09/19/2021   Procedure: INTRAVASCULAR PRESSURE WIRE/FFR STUDY;  Surgeon: Runell Gess, MD;  Location: [facility];  Service: Cardiovascular;  Laterality: N/A;  RAMUS   CORONARY STENT INTERVENTION N/A 05/02/2021   Procedure: CORONARY STENT INTERVENTION;  Surgeon: Runell Gess, MD;  Location: [facility];  Service: Cardiovascular;  Laterality: N/A;   CORONARY STENT INTERVENTION N/A 09/19/2021   Procedure: CORONARY STENT INTERVENTION;  Surgeon: Runell Gess, MD;  Location: [facility];  Service: Cardiovascular;  Laterality: N/A;   CORONARY STENT INTERVENTION N/A 12/29/2022   Procedure: CORONARY STENT INTERVENTION;  Surgeon: Jeffrey Hazel, MD;  Location: [facility];  Service: Cardiovascular;  Laterality: N/A;   LEFT HEART CATH AND CORONARY ANGIOGRAPHY N/A 05/02/2021   Procedure: LEFT HEART CATH AND CORONARY ANGIOGRAPHY;  Surgeon: Runell Gess, MD;  Location: [facility];  Service: Cardiovascular;  Laterality: N/A;   LEFT HEART CATH AND CORONARY ANGIOGRAPHY N/A 09/19/2021   Procedure: LEFT HEART CATH AND CORONARY ANGIOGRAPHY;  Surgeon: Runell Gess, MD;  Location: [facility];  Service: Cardiovascular;  Laterality: N/A;   NASAL SEPTUM SURGERY     Dr. Richardson Landry @ [facility]  2014   NM MYOVIEW LTD  2011   RIGHT/LEFT HEART CATH AND CORONARY ANGIOGRAPHY N/A 12/29/2022   Procedure: RIGHT/LEFT HEART CATH  AND CORONARY ANGIOGRAPHY;  Surgeon: Jeffrey Hazel, MD;  [facility]: [facility];  Service: Cardiovascular;  Laterality: N/A;   ROBOT ASSISTED LAPAROSCOPIC RADICAL PROSTATECTOMY     TOTAL HIP ARTHROPLASTY Right 04/19/2015   Procedure: RIGHT TOTAL HIP ARTHROPLASTY ANTERIOR APPROACH;  Surgeon: Jodi Geralds, MD;  Location: [facility];  Service: Orthopedics;  Laterality: Right;     Home Medications:  Prior to Admission medications   Medication Sig Start Date End Date Taking? Authorizing Provider  aspirin EC 81 MG EC tablet Take 1 tablet (81 mg total) by mouth daily. Swallow whole. 05/04/21  Yes Arty Baumgartner, NP  Choline Fenofibrate (FENOFIBRIC ACID) 45 MG CPDR Take 1 capsule by mouth once daily Patient taking differently: Take 45 mg by mouth every other day. 11/02/22  Yes Runell Gess, MD  clopidogrel (PLAVIX) 75 MG tablet Take 1 tablet (75 mg total) by mouth daily. 12/30/22  Yes Meng, Wynema Birch, PA  Cyanocobalamin (VITAMIN B-12 PO) Take 2,500 mcg by  mouth daily.   Yes [provider]  empagliflozin (JARDIANCE) 10 MG TABS tablet Take 1 tablet (10 mg total) by mouth daily. 01/07/23 02/06/23 Yes Arrien, York Ram, MD  fluticasone [facility]) 50 MCG/ACT nasal spray Place 1 spray into both nostrils daily.   Yes [provider]  furosemide (LASIX) 40 MG tablet Take 1 tablet (40 mg total) by mouth daily. Take twice daily in case of weight gain 2 to 3 lbs in 24 hrs or 5 lbs in 7 days. 01/06/23 04/06/23 Yes Arrien, York Ram, MD  isosorbide mononitrate (IMDUR) 30 MG 24 hr tablet Take 30 mg by mouth daily. 12/11/22  Yes [provider]  Magnesium Citrate 200 MG TABS Take 400 mg by mouth daily. 12/13/22  Yes Duke, Roe Rutherford, PA  metoprolol tartrate (LOPRESSOR) 25 MG tablet Take 1 tablet (25 mg total) by mouth 2 (two) times daily. 12/30/22  Yes Azalee Course, PA  nitroGLYCERIN (NITROSTAT) 0.4 MG SL tablet Place 1 tablet (0.4 mg total) under the tongue every 5 (five)  minutes as needed for chest pain. 11/14/22  Yes Runell Gess, MD  Omega-3 Fatty Acids (FISH OIL) 1000 MG CAPS Take 1,000 mg by mouth every other day. In the evening, alternating with fenofibrate acid   Yes [provider]  sertraline (ZOLOFT) 25 MG tablet Take 25 mg by mouth daily. 12/19/22 12/19/23 Yes [provider]    Inpatient Medications: Scheduled Meds:  [START ON 01/13/2023] aspirin EC  81 mg Oral Daily   [START ON 01/13/2023] clopidogrel  75 mg Oral Daily   [START ON 01/13/2023] empagliflozin  10 mg Oral Daily   enoxaparin (LOVENOX) injection  40 mg Subcutaneous Q24H   fenofibrate  54 mg Oral Daily   furosemide  40 mg Intravenous BID   [START ON 01/13/2023] sertraline  25 mg Oral Daily   sodium chloride flush  3 mL Intravenous Q12H   Continuous Infusions:  PRN Meds: acetaminophen **OR** acetaminophen, polyethylene glycol  Allergies:    Allergies  Allergen Reactions   Adhesive [Tape] Other (See Comments)    Mainly just redness.   Has been ruled out for latex allegery   Hydrochlorothiazide Other (See Comments)    H/o Pancreatitis, takes in Avalide at home   Latex Other (See Comments)    unknown   Vantin [Cefpodoxime] Rash    Social History:   Social History   Socioeconomic History   Marital status: Married    Spouse name: Not on file   Number of children: Not on file   Years of education: Not on file   Highest education level: Not on file  Occupational History   Not on file  Tobacco Use   Smoking status: Never   Smokeless tobacco: Never  Substance and Sexual Activity   Alcohol use: No   Drug use: No   Sexual activity: Not on file  Other Topics Concern   Not on file  Social History Narrative   Not on file   Social Determinants of Health   Financial Resource Strain: Not on file  Food Insecurity: No Food Insecurity (01/03/2023)   Hunger Vital Sign    Worried About Running Out of Food in the Last Year: Never true    Ran Out of Food  in the Last Year: Never true  Transportation Needs: No Transportation Needs (01/03/2023)   PRAPARE - Administrator, Civil Service (Medical): No    Lack of Transportation (Non-Medical): No  Recent Concern: Transportation Needs -  Unmet Transportation Needs (12/12/2022)   Received from [facility]   Transportation    In the past 12 months, has lack of reliable transportation kept you from medical appointments, meetings, work or from getting things needed for daily living? : Yes  Physical Activity: Not on file  Stress: Not on file  Social Connections: Not on file  Intimate Partner Violence: Not At Risk (01/03/2023)   Humiliation, Afraid, Rape, and Kick questionnaire    Fear of Current or Ex-Partner: No    Emotionally Abused: No    Physically Abused: No    Sexually Abused: No    Family History:    Family History  Problem Relation Age of Onset   Hypertension Mother    Heart disease Father    Hypertension Father      ROS:  Constitutional: Positive for weight gain. Negative for weight loss.  Cardiovascular:  Positive for dyspnea on exertion, leg swelling, orthopnea and paroxysmal nocturnal dyspnea. Negative for chest pain, claudication, cyanosis, irregular heartbeat, near-syncope, palpitations and syncope.  Respiratory:  Positive for shortness of breath. Negative for cough and hemoptysis.   Gastrointestinal:  Negative for abdominal pain, hematochezia and melena.  Genitourinary:  Negative for hematuria.  Neurological:  Positive for light-headedness. Negative for dizziness All other ROS reviewed and negative.     Physical Exam/Data:   Vitals:   01/12/23 1515 01/12/23 1530 01/12/23 1545 01/12/23 1611  BP:  130/63 110/61   Pulse:  60 (!) 55   Resp:  (!) 25 (!) 24   Temp: 97.8 F (36.6 C)   98 F (36.7 C)  TempSrc: Oral     SpO2:  97% 90%   Weight:      Height:       No intake or output data in the 24 hours ending 01/12/23 1643    01/12/2023   11:00 AM  01/12/2023    8:57 AM 01/06/2023    3:36 AM  Last 3 Weights  Weight (lbs) 207 lb 207 lb 202 lb 2.6 oz  Weight (kg) 93.895 kg 93.895 kg 91.7 kg     Body mass index is 28.07 kg/m.  General:  Well nourished, well developed, in no acute distress HEENT: normal Neck: no JVD Vascular: No carotid bruits; Distal pulses 2+ bilaterally Cardiac:  normal S1, S2; RRR; no murmur  Lungs:  clear to auscultation bilaterally, no wheezing, rhonchi or rales  Abd: soft, nontender, no hepatomegaly  Ext: 3+ pitting edema Musculoskeletal:  No deformities, BUE and BLE strength normal and equal Skin: warm and dry  Neuro:  CNs 2-12 intact, no focal abnormalities noted Psych:  Normal affect   EKG:  The EKG was personally reviewed and demonstrates:  sinus bradycardia, first degree AV block, 57bpm, LAD Telemetry:  Telemetry was personally reviewed and demonstrates: N/A  Relevant CV Studies: Echocardiogram 01/04/2023 1. Left ventricular ejection fraction, by estimation, is 65 to 70%. The  left ventricle has hyperdynamic function. The left ventricle has no  regional wall motion abnormalities. There is moderate asymmetric left  ventricular hypertrophy of the septal  segment. LV outflow tract gradient peak 54 mmHg. Left ventricular  diastolic parameters are consistent with Grade I diastolic dysfunction  (impaired relaxation).   2. Right ventricular systolic function is normal. The right ventricular  size is normal. There is normal pulmonary artery systolic pressure. The  estimated right ventricular systolic pressure is 27.2 mmHg.   3. Left atrial size was mildly dilated.   4. There is mitral chordal and probably  valvular systolic anterior  motion. The mitral valve is abnormal. Trivial mitral valve regurgitation.  No evidence of mitral stenosis.   5. The aortic valve is tricuspid. Aortic valve regurgitation is not  visualized.   6. Aortic dilatation noted. There is mild dilatation of the aortic root,   measuring 38 mm.   7. The inferior vena cava is dilated in size with <50% respiratory  variability, suggesting right atrial pressure of 15 mmHg.   8. Findings consistent with HOCM.    Cardiac cath 12/29/2022   Prox Cx to Mid Cx lesion is 40% stenosed.   Ost RCA to Prox RCA lesion is 30% stenosed.   Ramus lesion is 60% stenosed.   Mid LAD-1 lesion is 90% stenosed.   Previously placed Prox LAD to Mid LAD stent of unknown type is  widely patent.   Previously placed Mid LAD-3 stent of unknown type is  widely patent.   Non-stenotic RPDA lesion was previously treated.   A drug-eluting stent was successfully placed using a SYNERGY XD 2.75X20.   Post intervention, there is a 0% residual stenosis.   Post intervention, there is a 0% residual stenosis.   Patent stents in the proximal and mid LAD. Hazy severe stenosis in the proximal to mid LAD between the old stents.  Successful PTCA/DES x 1 proximal to mid LAD Moderate stenosis in the intermediate branch, unchanged from last cath (Flow wire negative last cath) Mild disease in the Circumflex Large dominant RCA with mild proximal disease and patent PDA stent without restenosis.  Normal right heart pressures LVEDP 16-18 mmHg   Diagnostic Dominance: Right  Intervention     Laboratory Data:  High Sensitivity Troponin:  No results for input(s): "TROPONINIHS" in the last 720 hours.   Chemistry Recent Labs  Lab 01/06/23 0240 01/12/23 1104  NA 133* 136  K 4.6 4.0  CL 95* 99  CO2 27 28  GLUCOSE 100* 102*  BUN 28* 26*  CREATININE 1.58* 1.44*  CALCIUM 9.1 9.1  GFRNONAA 44* 49*  ANIONGAP 11 9    No results for input(s): "PROT", "ALBUMIN", "AST", "ALT", "ALKPHOS", "BILITOT" in the last 168 hours. Lipids No results for input(s): "CHOL", "TRIG", "HDL", "LABVLDL", "LDLCALC", "CHOLHDL" in the last 168 hours.  Hematology Recent Labs  Lab 01/12/23 1104  WBC 9.0  RBC 4.05*  HGB 10.5*  HCT 34.1*  MCV 84.2  MCH 25.9*  MCHC 30.8  RDW  16.2*  PLT 230   Thyroid No results for input(s): "TSH", "FREET4" in the last 168 hours.  BNP Recent Labs  Lab 01/12/23 1104  BNP 134.3*    DDimer No results for input(s): "DDIMER" in the last 168 hours.   Radiology/Studies:  DG Chest 2 View  Result Date: 01/12/2023 CLINICAL DATA:  82 year old male shortness of breath "feels like fluid back up", lower extremity swelling. EXAM: CHEST - 2 VIEW COMPARISON:  CTA chest 01/04/2023 and earlier. FINDINGS: Upright AP and lateral views of the chest 1126 hours. Continued low lung volumes. Mild bibasilar atelectasis. Heart size remains within normal limits. No pneumothorax, pulmonary edema, pleural effusion, consolidation. Visualized tracheal air column is within normal limits. No acute osseous abnormality identified. Negative visible bowel gas. IMPRESSION: Low lung volumes, otherwise no acute cardiopulmonary abnormality. Electronically Signed   By: Odessa Fleming M.D.   On: 01/12/2023 11:33     Assessment and Plan:  Acute on chronic diastolic heart failure -Recently admitted for same on 01/03/2023 through 01/06/2023, 3kg total weight loss via diuresis -Echo  05/06/2022 with EF 65 to 70%, no RWMA, moderate LVH, grade 1 diastolic dysfunction, RV function okay -Has gained 5 pounds since admission, he is back to his preadmission HF exacerbation weight -Worsening SOB at rest, lower leg edema, periorbital edema -Was started on Jardiance last week -Unable to diurese outpatient as worsening AKI (1.52 > 1.73 > 1.58) -His GFR has decreased from 60 to 44 in 2 weeks -Pt seen by DOD Dr. Allyson Sabal who agrees pt will need ED evaluation, likely hospital admission for IV diuresis and close monitoring of kidney function in the setting of hypotension. Pt is agreeable with plan. Daughter agreeable to transport pt to [facility] ED.  -O2 saturation 93% on 2 L nasal cannula -At this time unsure why he is retaining fluid as successful PCI and encouraging echo over the past week    2.  CAD -History of multiple PCI's, see above -Most recent, 12/29/2022 successful PCI with DES to proximal to mid LAD, which was between his old stents -No chest pain -Continue aspirin 81 mg, Plavix 75 mg, Lasix 40 mg   3.  Hypertension -BP today 80/60, repeat 96/60 -Continues to be hypertensive, amlodipine discontinued due to hypertension -At this time we will have this evaluated in the hospital   4.  AKI -Worsening renal function over the past 2 weeks, following cardiac catheterization and IV diuresis -Pending ED evaluation as above   5.  Hyperlipidemia -LDL 59 on 11/07/2022 -Consider referral to lipid clinic to discuss alternative lipid lowering therapy in the setting of statin intolerance   Risk Assessment/Risk Scores:      :[id number]}      New York Heart Association (NYHA) Functional Class NYHA Class III        For questions or updates, please contact [facility] Please consult [email or web address] for contact info under    Signed, Denyce Robert, AGNP-C  01/12/2023 4:43 PM

## 2023-01-12 NOTE — ED Notes (Signed)
ED TO INPATIENT HANDOFF REPORT  ED Nurse Name and Phone #: chris rn [phone]   S Name/Age/Gender Jeffrey Acevedo 82 y.o. male Room/Bed: 009C/009C  Code Status   Code Status: Full Code  Home/SNF/Other Home Patient oriented to: self, place, time, and situation Is this baseline? Yes   Triage Complete: Triage complete  Chief Complaint Acute on chronic diastolic (congestive) heart failure (HCC) [I50.33]  Triage Note Pt states he feels like he has fluid buildup that has been ongoing.  Bilateral ankles are swollen. Pt states he feels short of breath. Pt states he has been in and out of hospital for same recently. Pt denies pain.    Allergies Allergies  Allergen Reactions   Adhesive [Tape] Other (See Comments)    Mainly just redness.   Has been ruled out for latex allegery   Hydrochlorothiazide Other (See Comments)    H/o Pancreatitis, takes in Avalide at home   Latex Other (See Comments)    unknown   Vantin [Cefpodoxime] Rash    Level of Care/Admitting Diagnosis ED Disposition     ED Disposition  Admit   Condition  --   Comment  Hospital Area: [facility] [[id number]]  Level of Care: Progressive [102]  Admit to Progressive based on following criteria: CARDIOVASCULAR & THORACIC of moderate stability with acute coronary syndrome symptoms/low risk myocardial infarction/hypertensive urgency/arrhythmias/heart failure potentially compromising stability and stable post cardiovascular intervention patients.  May place patient in observation at [facility] or [facility] if equivalent level of care is available:: No  Covid Evaluation: Asymptomatic - no recent exposure (last 10 days) testing not required  Diagnosis: Acute on chronic diastolic (congestive) heart failure [facility]) [[id number]]  Admitting Physician: Synetta Fail [[id number]]  Attending Physician: Synetta Fail [[id number]]          B Medical/Surgery History Past Medical History:  Diagnosis Date    Acute on chronic diastolic CHF (congestive heart failure) (HCC) 01/03/2023   Cancer (HCC)    prostate cancer surgery 2011   Chronic diastolic heart failure (HCC)    Coronary artery disease    sees Dr. Erlene Quan 3 mths ago.  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   Hyperlipidemia    Hypertension    Past Surgical History:  Procedure Laterality Date   CARDIAC CATHETERIZATION  06/17/2009   Proximal LAD 90% stenosis just beyond previously stented segment with a Multi-Link Vision bare-metal stent   CORONARY ANGIOPLASTY     stents placed in 1998 and 2011    CORONARY PRESSURE/FFR STUDY N/A 05/02/2021   Procedure: INTRAVASCULAR PRESSURE WIRE/FFR STUDY;  Surgeon: Runell Gess, MD;  Location: [facility] INVASIVE CV LAB;  Service: Cardiovascular;  Laterality: N/A;   CORONARY PRESSURE/FFR STUDY N/A 09/19/2021   Procedure: INTRAVASCULAR PRESSURE WIRE/FFR STUDY;  Surgeon: Runell Gess, MD;  Location: [facility] INVASIVE CV LAB;  Service: Cardiovascular;  Laterality: N/A;  RAMUS   CORONARY STENT INTERVENTION N/A 05/02/2021   Procedure: CORONARY STENT INTERVENTION;  Surgeon: Runell Gess, MD;  Location: [facility] INVASIVE CV LAB;  Service: Cardiovascular;  Laterality: N/A;   CORONARY STENT INTERVENTION N/A 09/19/2021   Procedure: CORONARY STENT INTERVENTION;  Surgeon: Runell Gess, MD;  Location: [facility] INVASIVE CV LAB;  Service: Cardiovascular;  Laterality: N/A;   CORONARY STENT INTERVENTION N/A 12/29/2022   Procedure: CORONARY STENT  INTERVENTION;  Surgeon: Kathleene Hazel, MD;  Location: [facility] INVASIVE CV LAB;  Service: Cardiovascular;  Laterality: N/A;   LEFT HEART CATH AND CORONARY ANGIOGRAPHY N/A 05/02/2021   Procedure: LEFT HEART CATH AND CORONARY ANGIOGRAPHY;  Surgeon: Runell Gess, MD;  Location: [facility];  Service: Cardiovascular;  Laterality: N/A;   LEFT HEART CATH AND CORONARY  ANGIOGRAPHY N/A 09/19/2021   Procedure: LEFT HEART CATH AND CORONARY ANGIOGRAPHY;  Surgeon: Runell Gess, MD;  Location: [facility];  Service: Cardiovascular;  Laterality: N/A;   NASAL SEPTUM SURGERY     Dr. Richardson Landry @ [facility]  2014   [facility]  2011   RIGHT/LEFT HEART CATH AND CORONARY ANGIOGRAPHY N/A 12/29/2022   Procedure: RIGHT/LEFT HEART CATH AND CORONARY ANGIOGRAPHY;  Surgeon: Kathleene Hazel, MD;  Location: [facility];  Service: Cardiovascular;  Laterality: N/A;   ROBOT ASSISTED LAPAROSCOPIC RADICAL PROSTATECTOMY     TOTAL HIP ARTHROPLASTY Right 04/19/2015   Procedure: RIGHT TOTAL HIP ARTHROPLASTY ANTERIOR APPROACH;  Surgeon: Jodi Geralds, MD;  Location: [facility] OR;  Service: Orthopedics;  Laterality: Right;     A IV Location/Drains/Wounds Patient Lines/Drains/Airways Status     Active Line/Drains/Airways     Name Placement date Placement time Site Days   Pressure Injury 09/19/21 Coccyx Stage 1 -  Intact skin with non-blanchable redness of a localized area usually over a bony prominence. 09/19/21  1450  -- 480   Wound / Incision (Open or Dehisced) 01/03/23 Irritant Dermatitis (Moisture Associated Skin Damage);(IAD) Incontinence Associated Dermatitis Perineum Bilateral redness noted with 2 raised area papulla/blister tender with erythermia 01/03/23  2130  Perineum  9            Intake/Output Last 24 hours No intake or output data in the 24 hours ending 01/12/23 1637  Labs/Imaging Results for orders placed or performed during the hospital encounter of 01/12/23 (from the past 48 hour(s))  Basic metabolic panel     Status: Abnormal   Collection Time: 01/12/23 11:04 AM  Result Value Ref Range   Sodium 136 135 - 145 mmol/L   Potassium 4.0 3.5 - 5.1 mmol/L   Chloride 99 98 - 111 mmol/L   CO2 28 22 - 32 mmol/L   Glucose, Bld 102 (H) 70 - 99 mg/dL    Comment: Glucose reference range applies only to samples taken after fasting for at least 8  hours.   BUN 26 (H) 8 - 23 mg/dL   Creatinine, Ser 0.98 (H) 0.61 - 1.24 mg/dL   Calcium 9.1 8.9 - 11.9 mg/dL   GFR, Estimated 49 (L) >60 mL/min    Comment: (NOTE) Calculated using the CKD-EPI Creatinine Equation (2021)    Anion gap 9 5 - 15    Comment: Performed at [facility] Lab, [address], [place], [place] [address]  CBC     Status: Abnormal   Collection Time: 01/12/23 11:04 AM  Result Value Ref Range   WBC 9.0 4.0 - 10.5 K/uL   RBC 4.05 (L) 4.22 - 5.81 MIL/uL   Hemoglobin 10.5 (L) 13.0 - 17.0 g/dL   HCT 95.6 (L) 21.3 - 08.6 %   MCV 84.2 80.0 - 100.0 fL   MCH 25.9 (L) 26.0 - 34.0 pg   MCHC 30.8 30.0 - 36.0 g/dL   RDW 57.8 (H) 46.9 - 62.9 %   Platelets 230 150 - 400 K/uL   nRBC 0.0 0.0 - 0.2 %  Comment: Performed at [facility], [address] [place], [place] [address]  Brain natriuretic peptide     Status: Abnormal   Collection Time: 01/12/23 11:04 AM  Result Value Ref Range   B Natriuretic Peptide 134.3 (H) 0.0 - 100.0 pg/mL    Comment: Performed at [facility], [address] [place], [place] [address]   DG Chest 2 View  Result Date: 01/12/2023 CLINICAL DATA:  82 year old male shortness of breath "feels like fluid back up", lower extremity swelling. EXAM: CHEST - 2 VIEW COMPARISON:  CTA chest 01/04/2023 and earlier. FINDINGS: Upright AP and lateral views of the chest 1126 hours. Continued low lung volumes. Mild bibasilar atelectasis. Heart size remains within normal limits. No pneumothorax, pulmonary edema, pleural effusion, consolidation. Visualized tracheal air column is within normal limits. No acute osseous abnormality identified. Negative visible bowel gas. IMPRESSION: Low lung volumes, otherwise no acute cardiopulmonary abnormality. Electronically Signed   By: Odessa Fleming M.D.   On: 01/12/2023 11:33    Pending Labs Unresulted Labs (From admission, onward)     Start     Ordered   01/19/23 0500  Creatinine, serum  (enoxaparin (LOVENOX)    CrCl  >/= 30 ml/min)  Weekly,   R     Comments: while on enoxaparin therapy    01/12/23 1628   01/13/23 0500  Comprehensive metabolic panel  Tomorrow morning,   R        01/12/23 1628   01/13/23 0500  CBC  Tomorrow morning,   R        01/12/23 1628   01/12/23 1612  Magnesium  Add-on,   AD        01/12/23 1628            Vitals/Pain Today's Vitals   01/12/23 1530 01/12/23 1545 01/12/23 1611 01/12/23 1612  BP: 130/63 110/61    Pulse: 60 (!) 55    Resp: (!) 25 (!) 24    Temp:   98 F (36.7 C)   TempSrc:      SpO2: 97% 90%    Weight:      Height:      PainSc:    0-No pain    Isolation Precautions No active isolations  Medications Medications  aspirin EC tablet 81 mg (has no administration in time range)  clopidogrel (PLAVIX) tablet 75 mg (has no administration in time range)  empagliflozin (JARDIANCE) tablet 10 mg (has no administration in time range)  sertraline (ZOLOFT) tablet 25 mg (has no administration in time range)  enoxaparin (LOVENOX) injection 40 mg (has no administration in time range)  sodium chloride flush (NS) 0.9 % injection 3 mL (has no administration in time range)  acetaminophen (TYLENOL) tablet 650 mg (has no administration in time range)    Or  acetaminophen (TYLENOL) suppository 650 mg (has no administration in time range)  polyethylene glycol (MIRALAX / GLYCOLAX) packet 17 g (has no administration in time range)  fenofibrate tablet 54 mg (has no administration in time range)    Mobility walks with device     Focused Assessments Cardiac Assessment Handoff:    No results found for: "CKTOTAL", "CKMB", "CKMBINDEX", "TROPONINI" Lab Results  Component Value Date   DDIMER 1.08 (H) 01/03/2023   Does the Patient currently have chest pain? No    R Recommendations: See Admitting Provider Note  Report given to:   Additional Notes:

## 2023-01-12 NOTE — ED Notes (Signed)
Lab called to add on BNP 

## 2023-01-13 DIAGNOSIS — I2510 Atherosclerotic heart disease of native coronary artery without angina pectoris: Secondary | ICD-10-CM | POA: Diagnosis present

## 2023-01-13 DIAGNOSIS — Z91119 Patient's noncompliance with dietary regimen due to unspecified reason: Secondary | ICD-10-CM | POA: Diagnosis not present

## 2023-01-13 DIAGNOSIS — Z7984 Long term (current) use of oral hypoglycemic drugs: Secondary | ICD-10-CM | POA: Diagnosis not present

## 2023-01-13 DIAGNOSIS — R739 Hyperglycemia, unspecified: Secondary | ICD-10-CM | POA: Diagnosis present

## 2023-01-13 DIAGNOSIS — Z8546 Personal history of malignant neoplasm of prostate: Secondary | ICD-10-CM | POA: Diagnosis not present

## 2023-01-13 DIAGNOSIS — Z96641 Presence of right artificial hip joint: Secondary | ICD-10-CM | POA: Diagnosis present

## 2023-01-13 DIAGNOSIS — H9193 Unspecified hearing loss, bilateral: Secondary | ICD-10-CM | POA: Diagnosis present

## 2023-01-13 DIAGNOSIS — N179 Acute kidney failure, unspecified: Secondary | ICD-10-CM | POA: Diagnosis not present

## 2023-01-13 DIAGNOSIS — Z9861 Coronary angioplasty status: Secondary | ICD-10-CM | POA: Diagnosis not present

## 2023-01-13 DIAGNOSIS — Z9981 Dependence on supplemental oxygen: Secondary | ICD-10-CM | POA: Diagnosis not present

## 2023-01-13 DIAGNOSIS — Z7982 Long term (current) use of aspirin: Secondary | ICD-10-CM | POA: Diagnosis not present

## 2023-01-13 DIAGNOSIS — E785 Hyperlipidemia, unspecified: Secondary | ICD-10-CM | POA: Diagnosis present

## 2023-01-13 DIAGNOSIS — I421 Obstructive hypertrophic cardiomyopathy: Secondary | ICD-10-CM | POA: Diagnosis present

## 2023-01-13 DIAGNOSIS — Z86718 Personal history of other venous thrombosis and embolism: Secondary | ICD-10-CM | POA: Diagnosis not present

## 2023-01-13 DIAGNOSIS — I959 Hypotension, unspecified: Secondary | ICD-10-CM | POA: Diagnosis present

## 2023-01-13 DIAGNOSIS — R001 Bradycardia, unspecified: Secondary | ICD-10-CM | POA: Diagnosis not present

## 2023-01-13 DIAGNOSIS — N1831 Chronic kidney disease, stage 3a: Secondary | ICD-10-CM | POA: Diagnosis present

## 2023-01-13 DIAGNOSIS — Z8249 Family history of ischemic heart disease and other diseases of the circulatory system: Secondary | ICD-10-CM | POA: Diagnosis not present

## 2023-01-13 DIAGNOSIS — R0602 Shortness of breath: Secondary | ICD-10-CM | POA: Diagnosis present

## 2023-01-13 DIAGNOSIS — E440 Moderate protein-calorie malnutrition: Secondary | ICD-10-CM | POA: Diagnosis present

## 2023-01-13 DIAGNOSIS — Z7902 Long term (current) use of antithrombotics/antiplatelets: Secondary | ICD-10-CM | POA: Diagnosis not present

## 2023-01-13 DIAGNOSIS — J9611 Chronic respiratory failure with hypoxia: Secondary | ICD-10-CM | POA: Diagnosis present

## 2023-01-13 DIAGNOSIS — I10 Essential (primary) hypertension: Secondary | ICD-10-CM | POA: Diagnosis not present

## 2023-01-13 DIAGNOSIS — Z79899 Other long term (current) drug therapy: Secondary | ICD-10-CM | POA: Diagnosis not present

## 2023-01-13 DIAGNOSIS — I130 Hypertensive heart and chronic kidney disease with heart failure and stage 1 through stage 4 chronic kidney disease, or unspecified chronic kidney disease: Secondary | ICD-10-CM | POA: Diagnosis present

## 2023-01-13 DIAGNOSIS — I5033 Acute on chronic diastolic (congestive) heart failure: Secondary | ICD-10-CM | POA: Diagnosis present

## 2023-01-13 DIAGNOSIS — Z955 Presence of coronary angioplasty implant and graft: Secondary | ICD-10-CM | POA: Diagnosis not present

## 2023-01-13 LAB — COMPREHENSIVE METABOLIC PANEL
ALT: 15 U/L (ref 0–44)
AST: 27 U/L (ref 15–41)
Albumin: 2.5 g/dL — ABNORMAL LOW (ref 3.5–5.0)
Alkaline Phosphatase: 33 U/L — ABNORMAL LOW (ref 38–126)
Anion gap: 10 (ref 5–15)
BUN: 27 mg/dL — ABNORMAL HIGH (ref 8–23)
CO2: 28 mmol/L (ref 22–32)
Calcium: 8.6 mg/dL — ABNORMAL LOW (ref 8.9–10.3)
Chloride: 96 mmol/L — ABNORMAL LOW (ref 98–111)
Creatinine, Ser: 1.36 mg/dL — ABNORMAL HIGH (ref 0.61–1.24)
GFR, Estimated: 52 mL/min — ABNORMAL LOW (ref >60)
Glucose, Bld: 104 mg/dL — ABNORMAL HIGH (ref 70–99)
Potassium: 4.1 mmol/L (ref 3.5–5.1)
Sodium: 134 mmol/L — ABNORMAL LOW (ref 135–145)
Total Bilirubin: 0.8 mg/dL (ref 0.3–1.2)
Total Protein: 5.1 g/dL — ABNORMAL LOW (ref 6.5–8.1)

## 2023-01-13 LAB — CBC
HCT: 29.4 % — ABNORMAL LOW (ref 39.0–52.0)
Hemoglobin: 9.4 g/dL — ABNORMAL LOW (ref 13.0–17.0)
MCH: 26.4 pg (ref 26.0–34.0)
MCHC: 32 g/dL (ref 30.0–36.0)
MCV: 82.6 fL (ref 80.0–100.0)
Platelets: 198 10*3/uL (ref 150–400)
RBC: 3.56 MIL/uL — ABNORMAL LOW (ref 4.22–5.81)
RDW: 16.3 % — ABNORMAL HIGH (ref 11.5–15.5)
WBC: 6.4 10*3/uL (ref 4.0–10.5)
nRBC: 0 % (ref 0.0–0.2)

## 2023-01-13 LAB — HEMOGLOBIN A1C
Hgb A1c MFr Bld: 5.5 % (ref 4.8–5.6)
Mean Plasma Glucose: 111.15 mg/dL

## 2023-01-13 MED ORDER — METOPROLOL SUCCINATE ER 25 MG PO TB24
25.0000 mg | ORAL_TABLET | Freq: Every day | ORAL | Status: DC
  Administered 2023-01-13 – 2023-01-18 (×6): 25 mg via ORAL
  Filled 2023-01-13 (×6): qty 1

## 2023-01-13 MED ORDER — SPIRONOLACTONE 12.5 MG HALF TABLET
12.5000 mg | ORAL_TABLET | Freq: Every day | ORAL | Status: DC
  Administered 2023-01-13 – 2023-01-15 (×3): 12.5 mg via ORAL
  Filled 2023-01-13 (×3): qty 1

## 2023-01-13 NOTE — Progress Notes (Signed)
PROGRESS NOTE    Jeffrey Acevedo  YNW:[id number] DOB: [birth year] DOA: 01/12/2023 PCP: [name], MD  82/M with history of diastolic CHF, HOCM, CAD, CKD 2, dyslipidemia presented to the ED with worsening shortness of breath, edema and weight gain. -Recently hospitalized for CAD on 10/11, treated with PCI and stent placement, subsequently hospitalized 10/16-10/19 with AKI and diastolic CHF, diuresed stabilized and discharged home on Lasix, Jardiance. -Seen in cardiology clinic was noted to be hypoxic and volume overloaded, sent to the ED for admission. 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 - Continue aspirin, Plavix, fenofibrate -Resume metoprolol, hold Imdur   Hyperlipidemia - Continue fenofibrate  Mild hyperglycemia Check HbA1c  DVT prophylaxis: Lovenox Code Status: Full code Family Communication: None present Disposition Plan: Home in 1 to 2 days  Consultants:    Procedures:   Antimicrobials:    Objective: Vitals:   01/12/23 2020 01/13/23 0007 01/13/23 0433 01/13/23 1144  BP:  110/76 (!) 102/59 105/65  Pulse:  68 62   Resp:   (!) 26 20  Temp:  98.5 F (36.9 C) 98.4 F (36.9 C) (!) 97.2 F (36.2 C)  TempSrc:  Oral Oral Oral  SpO2: 94% 95% 90% 94%  Weight:   93.6 kg   Height:        Intake/Output Summary (Last 24 hours) at 01/13/2023 1149 Last data filed at 01/13/2023 0012 Gross per 24 hour  Intake --  Output 1400 ml  Net -1400 ml   Filed  Weights   01/12/23 1100 01/13/23 0433  Weight: 93.9 kg 93.6 kg    Examination:  General exam: Appears calm and comfortable  HEENT: Positive JVD CVS: S1-S2, regular rhythm Lungs: Few basilar Rales  Abd: nondistended, soft and nontender.Normal bowel sounds heard. Central nervous system: Alert and oriented. No focal neurological deficits. Extremities: 1-2+ edema Skin: No rashes Psychiatry:  Mood & affect appropriate.     Data Reviewed:   CBC: Recent Labs  Lab 01/12/23 1104 01/13/23 0233  WBC 9.0 6.4  HGB 10.5* 9.4*  HCT 34.1* 29.4*  MCV 84.2 82.6  PLT 230 198   Basic Metabolic Panel: Recent Labs  Lab 01/12/23 1104 01/13/23 0233  NA 136 134*  K 4.0 4.1  CL 99 96*  CO2 28 28  GLUCOSE 102* 104*  BUN 26* 27*  CREATININE 1.44* 1.36*  CALCIUM 9.1 8.6*  MG 2.3  --    GFR: Estimated Creatinine Clearance: 50.6 mL/min (A) (by C-G formula based on SCr of 1.36 mg/dL (H)). Liver Function Tests: Recent Labs  Lab 01/13/23 0233  AST 27  ALT 15  ALKPHOS 33*  BILITOT 0.8  PROT 5.1*  ALBUMIN 2.5*   No results for input(s): "LIPASE", "AMYLASE" in the last 168 hours. No results for input(s): "AMMONIA" in the last 168 hours. Coagulation Profile: No results for input(s): "INR", "PROTIME" in the last 168 hours. Cardiac Enzymes: No results for input(s): "CKTOTAL", "CKMB", "CKMBINDEX", "TROPONINI" in the last 168 hours. BNP (last 3 results) No results for input(s): "PROBNP" in  the last 8760 hours. HbA1C: No results for input(s): "HGBA1C" in the last 72 hours. CBG: No results for input(s): "GLUCAP" in the last 168 hours. Lipid Profile: No results for input(s): "CHOL", "HDL", "LDLCALC", "TRIG", "CHOLHDL", "LDLDIRECT" in the last 72 hours. Thyroid Function Tests: No results for input(s): "TSH", "T4TOTAL", "FREET4", "T3FREE", "THYROIDAB" in the last 72 hours. Anemia Panel: No results for input(s): "VITAMINB12", "FOLATE", "FERRITIN", "TIBC", "IRON", "RETICCTPCT" in the last  72 hours. Urine analysis:    Component Value Date/Time   COLORURINE YELLOW 01/04/2023 0040   APPEARANCEUR CLEAR 01/04/2023 0040   LABSPEC 1.010 01/04/2023 0040   PHURINE 5.0 01/04/2023 0040   GLUCOSEU NEGATIVE 01/04/2023 0040   HGBUR MODERATE (A) 01/04/2023 0040   BILIRUBINUR NEGATIVE 01/04/2023 0040   KETONESUR NEGATIVE 01/04/2023 0040   PROTEINUR NEGATIVE 01/04/2023 0040   NITRITE NEGATIVE 01/04/2023 0040   LEUKOCYTESUR NEGATIVE 01/04/2023 0040   Sepsis Labs: @LABRCNTIP (procalcitonin:4,lacticidven:4)  )No results found for this or any previous visit (from the past 240 hour(s)).   Radiology Studies: DG Chest 2 View  Result Date: 01/12/2023 CLINICAL DATA:  82 year old male shortness of breath "feels like fluid back up", lower extremity swelling. EXAM: CHEST - 2 VIEW COMPARISON:  CTA chest 01/04/2023 and earlier. FINDINGS: Upright AP and lateral views of the chest 1126 hours. Continued low lung volumes. Mild bibasilar atelectasis. Heart size remains within normal limits. No pneumothorax, pulmonary edema, pleural effusion, consolidation. Visualized tracheal air column is within normal limits. No acute osseous abnormality identified. Negative visible bowel gas. IMPRESSION: Low lung volumes, otherwise no acute cardiopulmonary abnormality. Electronically Signed   By: Odessa Fleming M.D.   On: 01/12/2023 11:33     Scheduled Meds:  aspirin EC  81 mg Oral Daily   clopidogrel  75 mg Oral Daily   empagliflozin  10 mg Oral Daily   enoxaparin (LOVENOX) injection  40 mg Subcutaneous Q24H   fenofibrate  54 mg Oral Daily   furosemide  40 mg Intravenous BID   metoprolol succinate  25 mg Oral Daily   sertraline  25 mg Oral Daily   sodium chloride flush  3 mL Intravenous Q12H   spironolactone  12.5 mg Oral Daily   Continuous Infusions:   LOS: 0 days    Time spent:    Zannie Cove, MD [facility]   01/13/2023, 11:49 AM

## 2023-01-13 NOTE — Plan of Care (Signed)
Care plan reviewed.

## 2023-01-13 NOTE — Progress Notes (Signed)
Cardiology Progress Note  Patient ID: Jeffrey Acevedo MRN: [id number] DOB: 1940-12-18 Date of Encounter: 01/13/2023 Primary Cardiologist: [name], MD  Subjective   Chief Complaint: SOB  HPI: Good diuresis. Still with LE edema   ROS:  All other ROS reviewed and negative. Pertinent positives noted in the HPI.     Vital Signs   Vitals:   01/12/23 1937 01/12/23 2020 01/13/23 0007 01/13/23 0433  BP: 99/60  110/76 (!) 102/59  Pulse: 67  68 62  Resp: (!) 24   (!) 26  Temp:   98.5 F (36.9 C) 98.4 F (36.9 C)  TempSrc:   Oral Oral  SpO2: 98% 94% 95% 90%  Weight:    93.6 kg  Height:        Intake/Output Summary (Last 24 hours) at 01/13/2023 0905 Last data filed at 01/13/2023 0012 Gross per 24 hour  Intake --  Output 1400 ml  Net -1400 ml      01/13/2023    4:33 AM 01/12/2023   11:00 AM 01/12/2023    8:57 AM  Last 3 Weights  Weight (lbs) 206 lb 5.6 oz 207 lb 207 lb  Weight (kg) 93.6 kg 93.895 kg 93.895 kg      Telemetry  Overnight telemetry shows SR 70s, which I personally reviewed.    Physical Exam   Vitals:   01/12/23 1937 01/12/23 2020 01/13/23 0007 01/13/23 0433  BP: 99/60  110/76 (!) 102/59  Pulse: 67  68 62  Resp: (!) 24   (!) 26  Temp:   98.5 F (36.9 C) 98.4 F (36.9 C)  TempSrc:   Oral Oral  SpO2: 98% 94% 95% 90%  Weight:    93.6 kg  Height:        Intake/Output Summary (Last 24 hours) at 01/13/2023 0905 Last data filed at 01/13/2023 0012 Gross per 24 hour  Intake --  Output 1400 ml  Net -1400 ml       01/13/2023    4:33 AM 01/12/2023   11:00 AM 01/12/2023    8:57 AM  Last 3 Weights  Weight (lbs) 206 lb 5.6 oz 207 lb 207 lb  Weight (kg) 93.6 kg 93.895 kg 93.895 kg    Body mass index is 27.99 kg/m.  General: Well nourished, well developed, in no acute distress Head: Atraumatic, normal size  Eyes: PEERLA, EOMI  Neck: Supple, no JVD Endocrine: No thryomegaly Cardiac: Normal S1, S2; RRR; 2 out of 6 systolic ejection  murmur Lungs: Clear to auscultation bilaterally, no wheezing, rhonchi or rales  Abd: Soft, nontender, no hepatomegaly  Ext: 2+ pitting edema in the right lower extremity up to the knees, 1+ pitting edema in the left lower ankle Musculoskeletal: No deformities, BUE and BLE strength normal and equal Skin: Warm and dry, no rashes   Neuro: Alert and oriented to person, place, time, and situation, CNII-XII grossly intact, no focal deficits  Psych: Normal mood and affect   Cardiac Studies  TTE 01/04/2023  1. Left ventricular ejection fraction, by estimation, is 65 to 70%. The  left ventricle has hyperdynamic function. The left ventricle has no  regional wall motion abnormalities. There is moderate asymmetric left  ventricular hypertrophy of the septal  segment. LV outflow tract gradient peak 54 mmHg. Left ventricular  diastolic parameters are consistent with Grade I diastolic dysfunction  (impaired relaxation).   2. Right ventricular systolic function is normal. The right ventricular  size is normal. There is normal pulmonary artery systolic pressure. The  estimated right ventricular systolic pressure is 27.2 mmHg.   3. Left atrial size was mildly dilated.   4. There is mitral chordal and probably valvular systolic anterior  motion. The mitral valve is abnormal. Trivial mitral valve regurgitation.  No evidence of mitral stenosis.   5. The aortic valve is tricuspid. Aortic valve regurgitation is not  visualized.   6. Aortic dilatation noted. There is mild dilatation of the aortic root,  measuring 38 mm.   7. The inferior vena cava is dilated in size with <50% respiratory  variability, suggesting right atrial pressure of 15 mmHg.   8. Findings consistent with HOCM.   Patient Profile  Jeffrey Acevedo is a 82 y.o. male with HFpEF, hypertrophic cardiomyopathy, CAD, CKD stage IIIa, hypertension admitted on 01/12/2023 for acute on chronic diastolic heart failure.  Assessment & Plan   # Acute  on chronic diastolic heart failure # Hypertrophic obstructive cardiomyopathy, hyperdynamic LV with intracavitary obstruction # CKD stage IIIa # Dietary noncompliance -Still with 2+ pitting edema in the right lower extremity.  DVT study recently negative.  Will continue with IV Lasix 40 mg twice daily.  I have added Aldactone to assist with diuresis. -Echo shows intracavitary gradient.  Add back metoprolol succinate 25 mg daily. -He reports eating all of his meals outside of the home.  They are delivered.  Highly suspect he is getting lots of salt. -Continue Jardiance 10 mg daily.  # CAD with recent PCI -No symptoms of angina.  Continue aspirin and Plavix.      For questions or updates, please contact [facility] Please consult [email or web address] for contact info under        Signed, [facility] Lipps, MD, [facility] [place]  [place] [facility]  01/13/2023 9:05 AM

## 2023-01-14 DIAGNOSIS — I5033 Acute on chronic diastolic (congestive) heart failure: Secondary | ICD-10-CM | POA: Diagnosis not present

## 2023-01-14 LAB — BASIC METABOLIC PANEL
Anion gap: 7 (ref 5–15)
BUN: 26 mg/dL — ABNORMAL HIGH (ref 8–23)
CO2: 30 mmol/L (ref 22–32)
Calcium: 8.6 mg/dL — ABNORMAL LOW (ref 8.9–10.3)
Chloride: 94 mmol/L — ABNORMAL LOW (ref 98–111)
Creatinine, Ser: 1.48 mg/dL — ABNORMAL HIGH (ref 0.61–1.24)
GFR, Estimated: 47 mL/min — ABNORMAL LOW (ref >60)
Glucose, Bld: 102 mg/dL — ABNORMAL HIGH (ref 70–99)
Potassium: 3.9 mmol/L (ref 3.5–5.1)
Sodium: 131 mmol/L — ABNORMAL LOW (ref 135–145)

## 2023-01-14 MED ORDER — ATORVASTATIN CALCIUM 40 MG PO TABS
40.0000 mg | ORAL_TABLET | Freq: Every day | ORAL | Status: DC
  Administered 2023-01-14 – 2023-01-18 (×5): 40 mg via ORAL
  Filled 2023-01-14 (×5): qty 1

## 2023-01-14 MED ORDER — FLUTICASONE PROPIONATE 50 MCG/ACT NA SUSP
1.0000 | Freq: Every day | NASAL | Status: DC
  Administered 2023-01-14 – 2023-01-18 (×4): 1 via NASAL
  Filled 2023-01-14: qty 16

## 2023-01-14 NOTE — Progress Notes (Signed)
Cardiology Progress Note  Patient ID: Jeffrey Acevedo MRN: [id number] DOB: [birth year] Date of Encounter: 01/14/2023 Primary Cardiologist: [name], MD  Subjective   Chief Complaint: Edema  HPI: Edema improving.  Not back to baseline.  Still with right lower extremity edema.  ROS:  All other ROS reviewed and negative. Pertinent positives noted in the HPI.     Vital Signs   Vitals:   01/13/23 2018 01/14/23 0337 01/14/23 0500 01/14/23 0726  BP: 98/60 111/65  108/69  Pulse: 61 60  60  Resp: 20 20  19   Temp: 98 F (36.7 C) 98.4 F (36.9 C)  (!) 97.5 F (36.4 C)  TempSrc: Oral Oral  Oral  SpO2: 95% 96%    Weight:   89.6 kg   Height:        Intake/Output Summary (Last 24 hours) at 01/14/2023 0901 Last data filed at 01/14/2023 0825 Gross per 24 hour  Intake 843 ml  Output 3050 ml  Net -2207 ml      01/14/2023    5:00 AM 01/13/2023    4:33 AM 01/12/2023   11:00 AM  Last 3 Weights  Weight (lbs) 197 lb 8.5 oz 206 lb 5.6 oz 207 lb  Weight (kg) 89.6 kg 93.6 kg 93.895 kg      Telemetry  Overnight telemetry shows sinus rhythm 50 to 60 bpm, which I personally reviewed.   Physical Exam   Vitals:   01/13/23 2018 01/14/23 0337 01/14/23 0500 01/14/23 0726  BP: 98/60 111/65  108/69  Pulse: 61 60  60  Resp: 20 20  19   Temp: 98 F (36.7 C) 98.4 F (36.9 C)  (!) 97.5 F (36.4 C)  TempSrc: Oral Oral  Oral  SpO2: 95% 96%    Weight:   89.6 kg   Height:        Intake/Output Summary (Last 24 hours) at 01/14/2023 0901 Last data filed at 01/14/2023 0825 Gross per 24 hour  Intake 843 ml  Output 3050 ml  Net -2207 ml       01/14/2023    5:00 AM 01/13/2023    4:33 AM 01/12/2023   11:00 AM  Last 3 Weights  Weight (lbs) 197 lb 8.5 oz 206 lb 5.6 oz 207 lb  Weight (kg) 89.6 kg 93.6 kg 93.895 kg    Body mass index is 26.79 kg/m.  General: Well nourished, well developed, in no acute distress Head: Atraumatic, normal size  Eyes: PEERLA, EOMI  Neck: Supple, no  JVD Endocrine: No thryomegaly Cardiac: Normal S1, S2; RRR; no murmurs, rubs, or gallops Lungs: Clear to auscultation bilaterally, no wheezing, rhonchi or rales  Abd: Soft, nontender, no hepatomegaly  Ext: 2+ pitting edema in the right lower extremity, trace edema in the left Musculoskeletal: No deformities, BUE and BLE strength normal and equal Skin: Warm and dry, no rashes   Neuro: Alert and oriented to person, place, time, and situation, CNII-XII grossly intact, no focal deficits  Psych: Normal mood and affect   Cardiac Studies  TTE 01/04/2023  1. Left ventricular ejection fraction, by estimation, is 65 to 70%. The  left ventricle has hyperdynamic function. The left ventricle has no  regional wall motion abnormalities. There is moderate asymmetric left  ventricular hypertrophy of the septal  segment. LV outflow tract gradient peak 54 mmHg. Left ventricular  diastolic parameters are consistent with Grade I diastolic dysfunction  (impaired relaxation).   2. Right ventricular systolic function is normal. The right ventricular  size is  normal. There is normal pulmonary artery systolic pressure. The  estimated right ventricular systolic pressure is 27.2 mmHg.   3. Left atrial size was mildly dilated.   4. There is mitral chordal and probably valvular systolic anterior  motion. The mitral valve is abnormal. Trivial mitral valve regurgitation.  No evidence of mitral stenosis.   5. The aortic valve is tricuspid. Aortic valve regurgitation is not  visualized.   6. Aortic dilatation noted. There is mild dilatation of the aortic root,  measuring 38 mm.   7. The inferior vena cava is dilated in size with <50% respiratory  variability, suggesting right atrial pressure of 15 mmHg.   8. Findings consistent with HOCM.    Patient Profile  Jeffrey Acevedo is a 82 y.o. male with HFpEF, hypertrophic cardiomyopathy, CAD, CKD stage IIIa, hypertension admitted on 01/12/2023 for acute on chronic  diastolic heart failure.   Assessment & Plan   # Acute on chronic diastolic heart failure, EF 65 to 70% # Hypertrophic cardiomyopathy versus hyperdynamic LV with intracavitary obstruction # CKD stage IIIa # Dietary noncompliance -Still with significant edema in the right lower extremity.  He did have surgery in this leg so I suspect this explains the asymmetry.  No DVT on ultrasound. -Would continue with IV Lasix 40 mg twice daily today.  Continue Aldactone for potassium and diuresis. -I have ordered compression stockings.  This will help. -Continue Jardiance 10 mg daily. -Getting lots of salt at home.  Daughter told me he gets Meals on Wheels.  Suspect this is high in salt content.  They also eat at restaurants.  We did explain that salt is in lots of these meals. -Echo shows hypertrophic obstructive cardiomyopathy versus hyperdynamic LV with intracavitary obstruction.  Continue beta-blocker.  Does not appear to be symptomatic from this.  This seems to be more HFpEF and dietary indiscretion.  # CAD status post recent PCI -No angina.  Continue aspirin and Plavix. -Start Lipitor 40 mg daily.  On fenofibrate.       For questions or updates, please contact [facility] Please consult [email or web address] for contact info under        Signed, Gerri Spore T. Flora Lipps, MD, [facility]  [facility]  01/14/2023 9:01 AM

## 2023-01-14 NOTE — Plan of Care (Signed)
  Problem: Education: Goal: Ability to demonstrate management of disease process will improve Outcome: Progressing Goal: Ability to verbalize understanding of medication therapies will improve Outcome: Progressing   Problem: Cardiac: Goal: Ability to achieve and maintain adequate cardiopulmonary perfusion will improve Outcome: Progressing   Problem: Coping: Goal: Level of anxiety will decrease Outcome: Progressing   Problem: Elimination: Goal: Will not experience complications related to bowel motility Outcome: Progressing   Problem: Pain Management: Goal: General experience of comfort will improve Outcome: Progressing   Problem: Safety: Goal: Ability to remain free from injury will improve Outcome: Progressing   Problem: Skin Integrity: Goal: Risk for impaired skin integrity will decrease Outcome: Progressing

## 2023-01-14 NOTE — Progress Notes (Signed)
PROGRESS NOTE    Jeffrey Acevedo  MVH:[id number] DOB: 01-18-1941 DOA: 01/12/2023 PCP: [name], MD  82/M with history of diastolic CHF, HOCM, CAD, CKD 2, dyslipidemia presented to the ED with worsening shortness of breath, edema and weight gain. -Recently hospitalized for CAD on 10/11, treated with PCI and stent placement, subsequently hospitalized 10/16-10/19 with AKI and diastolic CHF, diuresed stabilized and discharged home on Lasix, Jardiance. -Seen in cardiology clinic was noted to be hypoxic and volume overloaded, sent to the ED for admission. -In the ED, hypoxic needing 3 L O2, creatinine 1.4, BNP 134, hemoglobin 10.5, chest x-ray with low lung volumes   Subjective: -Feels better, breathing improving, still with swelling  Assessment and Plan:  Acute on chronic diastolic CHF HOCM with hyperdynamic LV and intracavity obstruction -Volume status improving, continue IV Lasix 1 more day he is 3.7 L negative, continue Jardiance -Continue metoprolol -gets Meals on Wheels every day, discussed importance of limiting processed food -Attempt to wean O2, was discharged on oxygen recently following CHF exacerbation  AKI/CKD 2 -Stable, monitor   Hypertension -Continue metoprolol   CAD > Recent stent placement. - Continue aspirin, Plavix, fenofibrate -Resume metoprolol, hold Imdur   Hyperlipidemia - Continue fenofibrate  Mild hyperglycemia A1c 5.5  DVT prophylaxis: Lovenox Code Status: Full code Family Communication: None present Disposition Plan: Home in 1 to 2 days  Consultants:    Procedures:   Antimicrobials:    Objective: Vitals:   01/13/23 2018 01/14/23 0337 01/14/23 0500 01/14/23 0726  BP: 98/60 111/65  108/69  Pulse: 61 60  60  Resp: 20 20  19   Temp: 98 F (36.7 C) 98.4 F (36.9 C)  (!) 97.5 F (36.4 C)  TempSrc: Oral Oral  Oral  SpO2: 95% 96%    Weight:   89.6 kg   Height:        Intake/Output Summary (Last 24 hours) at 01/14/2023  1106 Last data filed at 01/14/2023 0825 Gross per 24 hour  Intake 603 ml  Output 1650 ml  Net -1047 ml   Filed Weights   01/12/23 1100 01/13/23 0433 01/14/23 0500  Weight: 93.9 kg 93.6 kg 89.6 kg    Examination:  General exam: AO x 2 HEENT: No JVD CVS: S1-S2, regular rhythm Lungs: Rare basilar Rales Abdomen: Soft, nontender, bowel sounds present Extremities: 1+ edema Skin: No rashes Psychiatry:  Mood & affect appropriate.     Data Reviewed:   CBC: Recent Labs  Lab 01/12/23 1104 01/13/23 0233  WBC 9.0 6.4  HGB 10.5* 9.4*  HCT 34.1* 29.4*  MCV 84.2 82.6  PLT 230 198   Basic Metabolic Panel: Recent Labs  Lab 01/12/23 1104 01/13/23 0233 01/14/23 0248  NA 136 134* 131*  K 4.0 4.1 3.9  CL 99 96* 94*  CO2 28 28 30   GLUCOSE 102* 104* 102*  BUN 26* 27* 26*  CREATININE 1.44* 1.36* 1.48*  CALCIUM 9.1 8.6* 8.6*  MG 2.3  --   --    GFR: Estimated Creatinine Clearance: 43 mL/min (A) (by C-G formula based on SCr of 1.48 mg/dL (H)). Liver Function Tests: Recent Labs  Lab 01/13/23 0233  AST 27  ALT 15  ALKPHOS 33*  BILITOT 0.8  PROT 5.1*  ALBUMIN 2.5*   No results for input(s): "LIPASE", "AMYLASE" in the last 168 hours. No results for input(s): "AMMONIA" in the last 168 hours. Coagulation Profile: No results for input(s): "INR", "PROTIME" in the last 168 hours. Cardiac Enzymes: No results for  input(s): "CKTOTAL", "CKMB", "CKMBINDEX", "TROPONINI" in the last 168 hours. BNP (last 3 results) No results for input(s): "PROBNP" in the last 8760 hours. HbA1C: Recent Labs    01/13/23 0233  HGBA1C 5.5   CBG: No results for input(s): "GLUCAP" in the last 168 hours. Lipid Profile: No results for input(s): "CHOL", "HDL", "LDLCALC", "TRIG", "CHOLHDL", "LDLDIRECT" in the last 72 hours. Thyroid Function Tests: No results for input(s): "TSH", "T4TOTAL", "FREET4", "T3FREE", "THYROIDAB" in the last 72 hours. Anemia Panel: No results for input(s): "VITAMINB12",  "FOLATE", "FERRITIN", "TIBC", "IRON", "RETICCTPCT" in the last 72 hours. Urine analysis:    Component Value Date/Time   COLORURINE YELLOW 01/04/2023 0040   APPEARANCEUR CLEAR 01/04/2023 0040   LABSPEC 1.010 01/04/2023 0040   PHURINE 5.0 01/04/2023 0040   GLUCOSEU NEGATIVE 01/04/2023 0040   HGBUR MODERATE (A) 01/04/2023 0040   BILIRUBINUR NEGATIVE 01/04/2023 0040   KETONESUR NEGATIVE 01/04/2023 0040   PROTEINUR NEGATIVE 01/04/2023 0040   NITRITE NEGATIVE 01/04/2023 0040   LEUKOCYTESUR NEGATIVE 01/04/2023 0040   Sepsis Labs: @LABRCNTIP (procalcitonin:4,lacticidven:4)  )No results found for this or any previous visit (from the past 240 hour(s)).   Radiology Studies: DG Chest 2 View  Result Date: 01/12/2023 CLINICAL DATA:  82 year old male shortness of breath "feels like fluid back up", lower extremity swelling. EXAM: CHEST - 2 VIEW COMPARISON:  CTA chest 01/04/2023 and earlier. FINDINGS: Upright AP and lateral views of the chest 1126 hours. Continued low lung volumes. Mild bibasilar atelectasis. Heart size remains within normal limits. No pneumothorax, pulmonary edema, pleural effusion, consolidation. Visualized tracheal air column is within normal limits. No acute osseous abnormality identified. Negative visible bowel gas. IMPRESSION: Low lung volumes, otherwise no acute cardiopulmonary abnormality. Electronically Signed   By: Odessa Fleming M.D.   On: 01/12/2023 11:33     Scheduled Meds:  aspirin EC  81 mg Oral Daily   atorvastatin  40 mg Oral Daily   clopidogrel  75 mg Oral Daily   empagliflozin  10 mg Oral Daily   enoxaparin (LOVENOX) injection  40 mg Subcutaneous Q24H   fenofibrate  54 mg Oral Daily   furosemide  40 mg Intravenous BID   metoprolol succinate  25 mg Oral Daily   sertraline  25 mg Oral Daily   sodium chloride flush  3 mL Intravenous Q12H   spironolactone  12.5 mg Oral Daily   Continuous Infusions:   LOS: 1 day    Time spent:    Zannie Cove, MD [facility]   01/14/2023, 11:06 AM

## 2023-01-15 DIAGNOSIS — Z9861 Coronary angioplasty status: Secondary | ICD-10-CM | POA: Diagnosis not present

## 2023-01-15 DIAGNOSIS — I2510 Atherosclerotic heart disease of native coronary artery without angina pectoris: Secondary | ICD-10-CM | POA: Diagnosis not present

## 2023-01-15 DIAGNOSIS — I5033 Acute on chronic diastolic (congestive) heart failure: Secondary | ICD-10-CM | POA: Diagnosis not present

## 2023-01-15 LAB — BASIC METABOLIC PANEL
Anion gap: 11 (ref 5–15)
BUN: 32 mg/dL — ABNORMAL HIGH (ref 8–23)
CO2: 29 mmol/L (ref 22–32)
Calcium: 9.1 mg/dL (ref 8.9–10.3)
Chloride: 93 mmol/L — ABNORMAL LOW (ref 98–111)
Creatinine, Ser: 1.68 mg/dL — ABNORMAL HIGH (ref 0.61–1.24)
GFR, Estimated: 41 mL/min — ABNORMAL LOW (ref >60)
Glucose, Bld: 102 mg/dL — ABNORMAL HIGH (ref 70–99)
Potassium: 4 mmol/L (ref 3.5–5.1)
Sodium: 133 mmol/L — ABNORMAL LOW (ref 135–145)

## 2023-01-15 LAB — LIPID PANEL
Cholesterol: 70 mg/dL (ref 0–200)
HDL: <10 mg/dL — ABNORMAL LOW (ref >40)
Triglycerides: 273 mg/dL — ABNORMAL HIGH (ref <150)
VLDL: 55 mg/dL — ABNORMAL HIGH (ref 0–40)

## 2023-01-15 MED ORDER — ADULT MULTIVITAMIN W/MINERALS CH
1.0000 | ORAL_TABLET | Freq: Every day | ORAL | Status: DC
  Administered 2023-01-15 – 2023-01-18 (×4): 1 via ORAL
  Filled 2023-01-15 (×4): qty 1

## 2023-01-15 MED ORDER — ENSURE ENLIVE PO LIQD
237.0000 mL | Freq: Two times a day (BID) | ORAL | Status: DC
  Administered 2023-01-16 – 2023-01-18 (×5): 237 mL via ORAL

## 2023-01-15 NOTE — Progress Notes (Signed)
PROGRESS NOTE    Jeffrey Acevedo  NGE:[id number] DOB: 18-Mar-1941 DOA: 01/12/2023 PCP: [name], MD  82/M with history of diastolic CHF, HOCM, CAD, CKD 2, dyslipidemia presented to the ED with worsening shortness of breath, edema and weight gain. -Recently hospitalized for CAD on 10/11, treated with PCI and stent placement, subsequently hospitalized 10/16-10/19 with AKI and diastolic CHF, diuresed stabilized and discharged home on Lasix, Jardiance. -Seen in cardiology clinic was noted to be hypoxic and volume overloaded, sent to the ED for admission. -In the ED, hypoxic needing 3 L O2, creatinine 1.4, BNP 134, hemoglobin 10.5, chest x-ray with low lung volumes   Subjective: -Feels better, breathing is improving  Assessment and Plan:  Acute on chronic diastolic CHF HOCM with hyperdynamic LV and intracavity obstruction -Volume status improving, diuresed with IV Lasix, he is 5.6 L negative  -Cards following, continue Jardiance and metoprolol, mild uptrend in creatinine -gets Meals on Wheels every day, discussed importance of limiting processed food, dietitian consult -Attempt to wean O2, was discharged on oxygen recently following CHF exacerbation  AKI/CKD 2 -Stable, monitor   Hypertension -Continue metoprolol   CAD -Recent PCI and LAD stent earlier this month  - Continue aspirin, Plavix, Plavix fenofibrate -  hold Imdur   Hyperlipidemia - Continue fenofibrate  Mild hyperglycemia A1c 5.5  DVT prophylaxis: Lovenox Code Status: Full code Family Communication: None present, called and updated daughter Disposition Plan: Home tomorrow  Consultants:    Procedures:   Antimicrobials:    Objective: Vitals:   01/14/23 1954 01/15/23 0024 01/15/23 0440 01/15/23 0730  BP: (!) 93/57 (!) 100/53 (!) 99/55 (!) 99/59  Pulse: 63 61 (!) 53 (!) 55  Resp: 20 20 20 18   Temp:  98.3 F (36.8 C) 98.3 F (36.8 C) 98.2 F (36.8 C)  TempSrc:  Oral Oral Oral  SpO2: 93% 91% 92%  94%  Weight:   91.9 kg   Height:        Intake/Output Summary (Last 24 hours) at 01/15/2023 1014 Last data filed at 01/15/2023 0731 Gross per 24 hour  Intake 303 ml  Output 2250 ml  Net -1947 ml   Filed Weights   01/13/23 0433 01/14/23 0500 01/15/23 0440  Weight: 93.6 kg 89.6 kg 91.9 kg    Examination:  General exam: AAO x 2 HEENT: No JVD CVS: S1-S2, regular rhythm Lungs: Rare basilar Rales Abdomen: Soft, nontender, bowel sounds present Extremities: Trace edema Skin: No rashes Psychiatry:  Mood & affect appropriate.     Data Reviewed:   CBC: Recent Labs  Lab 01/12/23 1104 01/13/23 0233  WBC 9.0 6.4  HGB 10.5* 9.4*  HCT 34.1* 29.4*  MCV 84.2 82.6  PLT 230 198   Basic Metabolic Panel: Recent Labs  Lab 01/12/23 1104 01/13/23 0233 01/14/23 0248 01/15/23 0300  NA 136 134* 131* 133*  K 4.0 4.1 3.9 4.0  CL 99 96* 94* 93*  CO2 28 28 30 29   GLUCOSE 102* 104* 102* 102*  BUN 26* 27* 26* 32*  CREATININE 1.44* 1.36* 1.48* 1.68*  CALCIUM 9.1 8.6* 8.6* 9.1  MG 2.3  --   --   --    GFR: Estimated Creatinine Clearance: 37.9 mL/min (A) (by C-G formula based on SCr of 1.68 mg/dL (H)). Liver Function Tests: Recent Labs  Lab 01/13/23 0233  AST 27  ALT 15  ALKPHOS 33*  BILITOT 0.8  PROT 5.1*  ALBUMIN 2.5*   No results for input(s): "LIPASE", "AMYLASE" in the last 168  hours. No results for input(s): "AMMONIA" in the last 168 hours. Coagulation Profile: No results for input(s): "INR", "PROTIME" in the last 168 hours. Cardiac Enzymes: No results for input(s): "CKTOTAL", "CKMB", "CKMBINDEX", "TROPONINI" in the last 168 hours. BNP (last 3 results) No results for input(s): "PROBNP" in the last 8760 hours. HbA1C: Recent Labs    01/13/23 0233  HGBA1C 5.5   CBG: No results for input(s): "GLUCAP" in the last 168 hours. Lipid Profile: No results for input(s): "CHOL", "HDL", "LDLCALC", "TRIG", "CHOLHDL", "LDLDIRECT" in the last 72 hours. Thyroid Function  Tests: No results for input(s): "TSH", "T4TOTAL", "FREET4", "T3FREE", "THYROIDAB" in the last 72 hours. Anemia Panel: No results for input(s): "VITAMINB12", "FOLATE", "FERRITIN", "TIBC", "IRON", "RETICCTPCT" in the last 72 hours. Urine analysis:    Component Value Date/Time   COLORURINE YELLOW 01/04/2023 0040   APPEARANCEUR CLEAR 01/04/2023 0040   LABSPEC 1.010 01/04/2023 0040   PHURINE 5.0 01/04/2023 0040   GLUCOSEU NEGATIVE 01/04/2023 0040   HGBUR MODERATE (A) 01/04/2023 0040   BILIRUBINUR NEGATIVE 01/04/2023 0040   KETONESUR NEGATIVE 01/04/2023 0040   PROTEINUR NEGATIVE 01/04/2023 0040   NITRITE NEGATIVE 01/04/2023 0040   LEUKOCYTESUR NEGATIVE 01/04/2023 0040   Sepsis Labs: @LABRCNTIP (procalcitonin:4,lacticidven:4)  )No results found for this or any previous visit (from the past 240 hour(s)).   Radiology Studies: No results found.   Scheduled Meds:  aspirin EC  81 mg Oral Daily   atorvastatin  40 mg Oral Daily   clopidogrel  75 mg Oral Daily   empagliflozin  10 mg Oral Daily   enoxaparin (LOVENOX) injection  40 mg Subcutaneous Q24H   fenofibrate  54 mg Oral Daily   fluticasone  1 spray Each Nare Daily   metoprolol succinate  25 mg Oral Daily   sertraline  25 mg Oral Daily   sodium chloride flush  3 mL Intravenous Q12H   Continuous Infusions:   LOS: 2 days    Time spent:    Zannie Cove, MD [facility]   01/15/2023, 10:14 AM

## 2023-01-15 NOTE — Progress Notes (Signed)
Heart Failure Nurse Navigator Progress Note  PCP: [name], MD PCP-Cardiologist: Allyson Sabal Admission Diagnosis: shortness of breath, Renal insufficiency, hypotension.  Admitted from: Cardiology office  Presentation:   Jeffrey Acevedo presented with feelings of fluid build up, bilateral ankle edema,5 pound weight gain, BNP 134,  EKG without acutely ischemic changes, CXR showed no pulmonary edema. Patient with multiple recent admissions.   Patient was interviewed for HF TOC per Dr. Jomarie Longs request, patient educated on the sign and symptoms of heart failure, daily weights, diet/ fluid restrictions, reported that him and his wife get meals on wheels delivered, taking all medications as prescribed , reported to sometimes forgetting to take them at times, attending all medical appointments, patient reported that he has friends from church who help and drive him and his wife to appointments. Patient scheduled a HF TOC appointment on 01/24/2023 @ 3 pm.   ECHO/ LVEF: 60-65%  Clinical Course:  Past Medical History:  Diagnosis Date   Acute on chronic diastolic CHF (congestive heart failure) (HCC) 01/03/2023   Cancer (HCC)    prostate cancer surgery 2011   Chronic diastolic heart failure (HCC)    Coronary artery disease    sees Dr. Erlene Quan 3 mths ago.  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   Hyperlipidemia    Hypertension      Social History   Socioeconomic History   Marital status: Married    Spouse name: Not on file   Number of children: Not on file   Years of education: Not on file   Highest education level: Not on file  Occupational History   Not on file  Tobacco Use   Smoking status: Never   Smokeless tobacco: Never  Substance and Sexual Activity   Alcohol use: No   Drug use: No   Sexual activity: Not on file  Other Topics Concern   Not on file  Social History  Narrative   Not on file   Social Determinants of Health   Financial Resource Strain: Not on file  Food Insecurity: No Food Insecurity (01/12/2023)   Hunger Vital Sign    Worried About Running Out of Food in the Last Year: Never true    Ran Out of Food in the Last Year: Never true  Transportation Needs: No Transportation Needs (01/12/2023)   PRAPARE - Administrator, Civil Service (Medical): No    Lack of Transportation (Non-Medical): No  Recent Concern: Transportation Needs - Unmet Transportation Needs (12/12/2022)   Received from [employer]    In the past 12 months, has lack of reliable transportation kept you from medical appointments, meetings, work or from getting things needed for daily living? : Yes  Physical Activity: Not on file  Stress: Not on file  Social Connections: Not on file   Education Assessment and Provision:  Detailed education and instructions provided on heart failure disease management including the following:  Signs and symptoms of Heart Failure When to call the physician Importance of daily weights Low sodium diet Fluid restriction Medication management Anticipated future follow-up appointments  Patient education given on each of the above topics.  Patient acknowledges understanding via teach back method and acceptance of all instructions.  Education Materials:  "Living Better With Heart Failure" Booklet, HF zone tool, & Daily Weight Tracker Tool.  Patient has scale at home: Yes Patient has pill box at home: Yes    High Risk Criteria for Readmission and/or Poor Patient Outcomes: Heart failure hospital admissions (last 6 months): 0  No Show rate: 0 Difficult social situation: Lives with wife Demonstrates medication adherence: "at times" reports to sometimes forgetting to take his medications.  Primary Language: English Literacy level: Reading, writing, and comprehension  Barriers of Care:   Diet/fluid restrictions  ( gets Meals on Wheels)  Daily weights Has church friends transport to appointments Medication compliance  Considerations/Referrals:   Referral made to Heart Failure Pharmacist Stewardship: Yes Referral made to Heart Failure CSW/NCM TOC: No Referral made to Heart & Vascular TOC clinic: Yes, per Dr. Jomarie Longs on 01/24/2023 @ 3 pm  Items for Follow-up on DC/TOC: Medication compliance Diet/ fluid restrictions ( Meals on Wheels)  Daily weights  Continued HF education   Rhae Hammock, BSN, RN Heart Failure Print production planner Chat Only

## 2023-01-15 NOTE — Evaluation (Signed)
Physical Therapy Evaluation Patient Details Name: Jeffrey Acevedo MRN: [id number] DOB: Feb 23, 1941 Today's Date: 01/15/2023  History of Present Illness  82 yo male presents to [facility] on 10/25 with fluid overload and SOB, workup for HF exacerbation. Recent admission 10/16-10/19 for AKI, HF exacerbation, respiratory failure. PMH includes HTN, HLD, CAD s/p stents, CKDII, dCHF.  Clinical Impression   Pt presents with generalized weakness, impaired balance, impaired activity tolerance, and poor safety awareness. Pt to benefit from acute PT to address deficits. Pt ambulated hallway distance with use of RW and increased time, cues for form/safety throughout. Pt lives at home with his wife, typically pt helps his wife but has been struggling himself over the past few weeks. Pt states their plan is an in-home caregiver, as well as HH services. PT to progress mobility as tolerated, and will continue to follow acutely.          If plan is discharge home, recommend the following: A little help with walking and/or transfers;A little help with bathing/dressing/bathroom   Can travel by private vehicle        Equipment Recommendations None recommended by PT  Recommendations for Other Services       Functional Status Assessment Patient has had a recent decline in their functional status and demonstrates the ability to make significant improvements in function in a reasonable and predictable amount of time.     Precautions / Restrictions Precautions Precautions: Fall Precaution Comments: on 3LO2 chronically since stents Restrictions Weight Bearing Restrictions: No      Mobility  Bed Mobility Overal bed mobility: Needs Assistance             General bed mobility comments: up in chair    Transfers Overall transfer level: Needs assistance Equipment used: Rolling walker (2 wheels) Transfers: Sit to/from Stand Sit to Stand: Min assist           General transfer comment: assist for rise  and steady, cues for hand placement when rising and sitting    Ambulation/Gait Ambulation/Gait assistance: Contact guard assist Gait Distance (Feet): 200 Feet Assistive device: Rolling walker (2 wheels) Gait Pattern/deviations: Step-through pattern, Decreased stride length, Trunk flexed Gait velocity: decr     General Gait Details: close guard for safety, cues for proximity to RW and upright posture. Spo2 98% on 3LO2 post-gait  Stairs            Wheelchair Mobility     Tilt Bed    Modified Rankin (Stroke Patients Only)       Balance Overall balance assessment: Needs assistance, History of Falls Sitting-balance support: No upper extremity supported, Feet supported Sitting balance-Leahy Scale: Fair     Standing balance support: Bilateral upper extremity supported, During functional activity Standing balance-Leahy Scale: Poor Standing balance comment: reliant on external support                             Pertinent Vitals/Pain Pain Assessment Pain Assessment: Faces Faces Pain Scale: Hurts a little bit Pain Location: buttocks, sacral wound Pain Descriptors / Indicators: Discomfort Pain Intervention(s): Limited activity within patient's tolerance, Monitored during session    Home Living Family/patient expects to be discharged to:: Private residence Living Arrangements: Spouse/significant other Available Help at Discharge: Family;Home health;Available PRN/intermittently Type of Home: House Home Access: Stairs to enter Entrance Stairs-Rails: Right Entrance Stairs-Number of Steps: 7 (but can enter through basement with chair lift per daughter) Alternate Level Stairs-Number of Steps: 6 -  split level home Home Layout: Multi-level Home Equipment: Grab bars - tub/shower;Grab bars - toilet;Rolling Walker (2 wheels);BSC/3in1;Shower seat Additional Comments: chair lift    Prior Function Prior Level of Function : Needs assist             Mobility  Comments: uses RW for gait, reports 1 fall which occurred while hospitalized ADLs Comments: pt's son-in-law assists pt with bathing, pt states he just hired an Engineer, production to assist pt and wife with ADLs and meals     Extremity/Trunk Assessment   Upper Extremity Assessment Upper Extremity Assessment: Defer to OT evaluation    Lower Extremity Assessment Lower Extremity Assessment: Generalized weakness    Cervical / Trunk Assessment Cervical / Trunk Assessment: Normal  Communication   Communication Communication: Hearing impairment Cueing Techniques: Verbal cues;Gestural cues  Cognition Arousal: Alert Behavior During Therapy: WFL for tasks assessed/performed Overall Cognitive Status: Within Functional Limits for tasks assessed                                          General Comments      Exercises General Exercises - Lower Extremity Ankle Circles/Pumps: AROM, Both, 5 reps, Seated Quad Sets: AROM, Both, Seated   Assessment/Plan    PT Assessment Patient needs continued PT services  PT Problem List Decreased strength;Decreased mobility;Decreased activity tolerance;Decreased balance;Decreased knowledge of use of DME;Pain;Cardiopulmonary status limiting activity;Decreased safety awareness       PT Treatment Interventions DME instruction;Therapeutic activities;Gait training;Therapeutic exercise;Patient/family education;Balance training;Stair training;Functional mobility training;Neuromuscular re-education    PT Goals (Current goals can be found in the Care Plan section)  Acute Rehab PT Goals Patient Stated Goal: home PT Goal Formulation: With patient Time For Goal Achievement: 01/29/23 Potential to Achieve Goals: Good    Frequency Min 1X/week     Co-evaluation               AM-PAC PT "6 Clicks" Mobility  Outcome Measure Help needed turning from your back to your side while in a flat bed without using bedrails?: A Little Help needed moving from lying  on your back to sitting on the side of a flat bed without using bedrails?: A Little Help needed moving to and from a bed to a chair (including a wheelchair)?: A Little Help needed standing up from a chair using your arms (e.g., wheelchair or bedside chair)?: A Little Help needed to walk in hospital room?: A Little Help needed climbing 3-5 steps with a railing? : A Little 6 Click Score: 18    End of Session Equipment Utilized During Treatment: Oxygen Activity Tolerance: Patient tolerated treatment well Patient left: in chair;with call bell/phone within reach;with family/visitor present;with nursing/sitter in room (RN states no need for chair alarm as pt has been up in chair today without one and family is at bedside and will stay at bedside until pt is back to bed.) Nurse Communication: Mobility status PT Visit Diagnosis: Other abnormalities of gait and mobility (R26.89);Muscle weakness (generalized) (M62.81)    Time: 6606-3016 PT Time Calculation (min) (ACUTE ONLY): 23 min   Charges:   PT Evaluation $PT Eval Low Complexity: 1 Low   PT General Charges $$ ACUTE PT VISIT: 1 Visit         Marye Round, PT DPT Acute Rehabilitation Services Secure Chat Preferred  Office [phone]   Leotha Voeltz E Stroup 01/15/2023, 5:30 PM

## 2023-01-15 NOTE — Progress Notes (Signed)
Initial Nutrition Assessment  DOCUMENTATION CODES:   Non-severe (moderate) malnutrition in context of chronic illness  INTERVENTION:  Liberalize diet to 2g sodium diet to promote least restrictive diet while providing wider variety of menu options to support increased nutritional needs Ensure Enlive po BID, each supplement provides 350 kcal and 20 grams of protein (strawberry) MVI with minerals daily "Low Sodium Nutrition Therapy" and "High Calorie, High Protein Nutrition Therapy" handout discussed and added to AVS  NUTRITION DIAGNOSIS:   Moderate Malnutrition related to chronic illness (heart failure, CAD) as evidenced by mild fat depletion, moderate muscle depletion, mild muscle depletion, edema.  GOAL:   Patient will meet greater than or equal to 90% of their needs  MONITOR:   PO intake, Supplement acceptance, Labs  REASON FOR ASSESSMENT:   Consult Diet education  ASSESSMENT:   Pt admitted with SOB and volume overload secondary to acute on chronic diastolic heart failure. PMH significant for HTN, HLD, CAD s/p stents (10/11) and CKD stage 2. Recently admitted 10/16-10/19 for AKI, acute on chronic heart failure and acute respiratory failure.  Pt sitting up in bedside recliner at time of visit. He reports that over the last 3 weeks he and his wife have been receiving Meals on wheels 5 days per week which typically includes a protein such as a slice of meatloaf, rice, green beans, fruit, occasionally pinto beans or cornbread. He states that the meals have been bland and unseasoned.     His breakfast usually includes either cereal or oatmeal. He takes his medications and weighs himself. After his wife's medical treatments, they will usually go to a diner for dinner.   Prior to the last 3 weeks, pt reports that he would "improvise" for meals. He and his wife both have medical issues that hinder ability to prepare meals. He states that they would sometimes cook or eat out for ease of  preparation. Provided ways to help reduce sodium while eating out without sacrificing nutritional intake and quality.   Pt states that he has been weighing at home and his scale has reflected ~201 lbs (~91.4 kg) but reports that his legs have been swelling therefore questions whether his scale is calibrated well.   Reviewed weight history on file. Over the last 9 months, weight documentation's have been between 89-93.9 kg.   Edema: moderate pitting BLE  Medications: jardiance, fenofibrate  Labs: sodium 133, BUN 32, Cr 1.68, GFR 41, TG's 273  UOP: x24 hours I/O's: - since admit  NUTRITION - FOCUSED PHYSICAL EXAM:  Flowsheet Row Most Recent Value  Orbital Region Mild depletion  Upper Arm Region Moderate depletion  Thoracic and Lumbar Region No depletion  Buccal Region No depletion  Temple Region No depletion  Clavicle Bone Region Mild depletion  Clavicle and Acromion Bone Region Mild depletion  Scapular Bone Region Mild depletion  Dorsal Hand Mild depletion  Patellar Region Moderate depletion  Anterior Thigh Region Moderate depletion  Posterior Calf Region Mild depletion  [L calf,  RLE edema]  Edema (RD Assessment) Moderate  [RLE>LLE]  Hair Reviewed  Eyes Reviewed  Mouth Reviewed  Skin Reviewed  Nails Reviewed       Diet Order:   Diet Order             Diet Heart Room service appropriate? Yes; Fluid consistency: Thin; Fluid restriction: 1500 mL Fluid  Diet effective now                   EDUCATION NEEDS:  Education needs have been addressed  Skin:  Skin Assessment: Reviewed RN Assessment (MASD perineum)  Last BM:  10/27  Height:   Ht Readings from Last 1 Encounters:  01/12/23 6' (1.829 m)    Weight:   Wt Readings from Last 1 Encounters:  01/15/23 91.9 kg    Ideal Body Weight:  80.9 kg  BMI:  Body mass index is 27.48 kg/m.  Estimated Nutritional Needs:   Kcal:  1900-2100  Protein:  95-110g  Fluid:  >/=1.9L  Drusilla Kanner, RDN, LDN Clinical Nutrition

## 2023-01-15 NOTE — Progress Notes (Addendum)
Patient Name: Jeffrey Acevedo Date of Encounter: 01/15/2023 [facility] Cardiologist: [name], MD   Interval Summary  .    82 yr old male with PMH of HFpEF, CAD s/p LAD stents (last 12/29/22), HTN, HLD, prostate cancer, CKD II, who is admitted for acute on chronic diastolic heart failure.   Patient states he feels better, not SOB at rest, BLE edema has much improved, LLE edema is always more than the RLE. He denied any chest pain. He asked what he should do at home for CHF.    Vital Signs .    Vitals:   01/14/23 1954 01/15/23 0024 01/15/23 0440 01/15/23 0730  BP: (!) 93/57 (!) 100/53 (!) 99/55 (!) 99/59  Pulse: 63 61 (!) 53 (!) 55  Resp: 20 20 20 18   Temp:  98.3 F (36.8 C) 98.3 F (36.8 C) 98.2 F (36.8 C)  TempSrc:  Oral Oral Oral  SpO2: 93% 91% 92% 94%  Weight:   91.9 kg   Height:        Intake/Output Summary (Last 24 hours) at 01/15/2023 0943 Last data filed at 01/15/2023 0731 Gross per 24 hour  Intake 303 ml  Output 2250 ml  Net -1947 ml      01/15/2023    4:40 AM 01/14/2023    5:00 AM 01/13/2023    4:33 AM  Last 3 Weights  Weight (lbs) 202 lb 9.6 oz 197 lb 8.5 oz 206 lb 5.6 oz  Weight (kg) 91.9 kg 89.6 kg 93.6 kg      Telemetry/ECG    Sinus rhythm/bradycardia 50-60s - Personally Reviewed  Physical Exam .   GEN: No acute distress.   Neck: No JVD Cardiac: RRR, no murmurs, rubs, or gallops.  Respiratory: Clear to auscultation bilaterally. On [place] oxygen.  GI: Soft, nontender, non-distended  MS: BLE 1+ edema remains LLE >RLE, much improved per patient   Assessment & Plan .      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  -presented again on 01/12/23 with worsening SOB at rest, lower leg edema, periorbital  edema, sent from cardiology office to [facility] for admission for CHF, consumes large amount salt  - BNP 134, CXR no acute finding, POA  - Echo from 01/04/23 showed LVEF 65-70%, no RWMA, mod asymmetric LVH, LV outflow tract gradient peak 54 mmHg, grade I DD, normal RV, PASP 27.2 mmHg, mild LAE, There is mitral chordal and probably valvular systolic anterior motion. The mitral valve is abnormal. Trivial mitral valve regurgitation. mild dilatation of the aortic root,  measuring 38 mm. IVC dilated.  - Cr 1.2 on 12/30/22 after stent placement, 1.44-1.73-1.58 during last hospitalization for CHF exacerbation requiring IV diuresis, 1.44>1.36 >1.48 >1.68 this admission, UA from 01/04/23 + Hgb. No additional recent renal workup noted so far. Non-oliguric. Suspect this is multifactorial from diuresis + GDMT use, although no work up sent for AKI thus far  - Net -5.6L, weight down from 207 - 202.6 ib today, responding to diuresis well, clinically improving on exam today  - will stop IV lasix 40mg  BID today, he is euvolemic on exam with worsening AKI, leg edema likely will need time to resolve along with compression stoking and ambulation,  hold diuresis today, plan to resume PO Lasix at 40mg  daily tomorrow if renal index stabilize  -  He has been educated on diet restriction and sodium restriction and CHF symptoms,  dietary indiscretion likely the cause of exacerbation this admission, will need repeat Echo outpatient to reassess HOCM and monitor symptoms  - GDMT: continue metoprolol XL 25mg  daily, will hold spironolactone today due to worsening AKI, continue jardiance 10mg  daily  CAD with prox to mid LAD stent 12/29/22 - no chest pain  - continue DAPT with ASA and plavix, added lipitor 40mg  daily this admission, continue dfenofibrate 54mg  and metoprolol XL 25mg  daily  - will send lipid panel today, LDL 59 from 11/15/22     For questions or updates, please contact [facility] Please consult [email or web address] for  contact info under        Signed, Cyndi Bender, NP   Personally seen and examined. Agree with above.  82 year old here with acute on chronic diastolic heart failure with comorbidities of coronary disease status post LAD stent hypotension bradycardia  -Echocardiogram from 01/04/2023 showed LV outflow tract gradient of 54 mmHg.  Overly aggressive diuresis may result in increased intracavitary gradient.  Stopping IV Lasix 40 mg twice daily today.  Seems euvolemic and his renal function is worsening with AKI.  Agree with compression stockings and elevation of legs to help with lower extremity edema but he is likely intravascularly depleted. -Continuing with Plavix 75 mg a day, Jardiance 10 mg a day, Toprol 25 mg a day, aspirin 81 mg a day, atorvastatin 40 mg a day for plaque stabilization.  Donato Schultz, MD

## 2023-01-15 NOTE — Progress Notes (Signed)
   Heart Failure Stewardship Pharmacist Progress Note   PCP: [name], MD PCP-Cardiologist: [name], MD    HPI:  82 yo M with PMH of CHF, CAD, HTN, HLD, and CKD IIIa.   Recently admitted 10/11-10/12 for unstable angina. Underwent PCI with LAD stenting. PA 23, wedge 7, and LVEDP 18.   Readmitted 10/16-10/19 with acute on chronic CHF. ECHO with EF 65-70%, positive LVOT with high gradient. 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 Diuresed with IV lasix and started on Jardiance at discharge.   Was seen in follow up on 10/25 and was still reporting shortness of breath, LE edema, and weight gain. Unable to diurese as outpatient with worsening AKI. He was referred to the ED for readmission. CXR showed no pulmonary edema. BNP 134.   Current HF Medications: Beta Blocker: metoprolol XL 25 mg daily SGLT2i: Jardiance 10 mg daily  Prior to admission HF Medications: Diuretic: furosemide 40 mg daily (BID PRN edema) Beta blocker: metoprolol tartrate 25 mg BID SGLT2i: Jardiance 10 mg daily Other: Imdur 30 mg daily  Pertinent Lab Values: Serum creatinine 1.68, BUN 32, Potassium 4.0, Sodium 133, BNP 134.3, Magnesium 2.3, A1c 5.5   Vital Signs: Weight: 202 lbs (admission weight: 206 lbs) Blood pressure: 100/60s  Heart rate: 50-60s  I/O: net -2.1L yesterday; net -5.8L since admission  Medication Assistance / Insurance Benefits Check: Does the patient have prescription insurance?  Yes Type of insurance plan: [employer]  Outpatient Pharmacy:  Prior to admission outpatient pharmacy: [facility] Is the patient willing to use [facility] TOC pharmacy at discharge? Yes Is the patient willing to transition their outpatient pharmacy to utilize a [facility] outpatient pharmacy?   Yes - would like to transition to [facility] mail order after he speaks with his wife and daughter    Assessment: 1. Acute on chronic diastolic CHF (LVEF 65-70%), due to HOCM vs hyperdynamic LV with  intracavitary obstruction. Positive LVOT with high gradient. NYHA class II-III symptoms. - Volume improving, now off furosemide 40 gm IV BID with AKI. Down to home O2 requirements - 3L. Strict I/Os and daily weights. Keep K>4 and Mg>2. - Continue metoprolol XL 25 mg daily - Spironolactone now held 2/2 AKI. Consider restarting tomorrow if creatinine improved.  - Continue Jardiance 10 mg daily - Holding PTA Imdur - would not resume at discharge  Plan: 1) Medication changes recommended at this time: - Restart spironolactone tomorrow if creatinine improved  2) Patient assistance: - Patient relies on his wife and daughter to pick up his prescriptions at the pharmacy. Interested in mail order - would like to discuss with his wife and daughter first.  3)  Education  - Patient has been educated on current HF medications and potential additions to HF medication regimen - Patient verbalizes understanding that over the next few months, these medication doses may change and more medications may be added to optimize HF regimen - Patient has been educated on basic disease state pathophysiology and goals of therapy   Sharen Hones, PharmD, BCPS Heart Failure Stewardship Pharmacist Phone [phone]

## 2023-01-16 ENCOUNTER — Other Ambulatory Visit (HOSPITAL_BASED_OUTPATIENT_CLINIC_OR_DEPARTMENT_OTHER): Payer: Self-pay

## 2023-01-16 ENCOUNTER — Other Ambulatory Visit (HOSPITAL_COMMUNITY): Payer: Self-pay

## 2023-01-16 DIAGNOSIS — E440 Moderate protein-calorie malnutrition: Secondary | ICD-10-CM

## 2023-01-16 DIAGNOSIS — I5033 Acute on chronic diastolic (congestive) heart failure: Secondary | ICD-10-CM | POA: Diagnosis not present

## 2023-01-16 LAB — BASIC METABOLIC PANEL
Anion gap: 9 (ref 5–15)
BUN: 31 mg/dL — ABNORMAL HIGH (ref 8–23)
CO2: 29 mmol/L (ref 22–32)
Calcium: 9 mg/dL (ref 8.9–10.3)
Chloride: 95 mmol/L — ABNORMAL LOW (ref 98–111)
Creatinine, Ser: 1.4 mg/dL — ABNORMAL HIGH (ref 0.61–1.24)
GFR, Estimated: 50 mL/min — ABNORMAL LOW (ref >60)
Glucose, Bld: 96 mg/dL (ref 70–99)
Potassium: 4.2 mmol/L (ref 3.5–5.1)
Sodium: 133 mmol/L — ABNORMAL LOW (ref 135–145)

## 2023-01-16 MED ORDER — AMLODIPINE BESYLATE 5 MG PO TABS: 5.0000 mg | ORAL_TABLET | Freq: Every day | ORAL | 3 refills | Status: DC

## 2023-01-16 MED ORDER — TERBINAFINE HCL 250 MG PO TABS: 250.0000 mg | ORAL_TABLET | Freq: Every day | ORAL | 1 refills | Status: DC

## 2023-01-16 MED ORDER — FUROSEMIDE 10 MG/ML IJ SOLN
40.0000 mg | Freq: Two times a day (BID) | INTRAMUSCULAR | Status: DC
  Administered 2023-01-16 – 2023-01-18 (×4): 40 mg via INTRAVENOUS
  Filled 2023-01-16 (×4): qty 4

## 2023-01-16 MED ORDER — TRIAMCINOLONE ACETONIDE 0.1 % EX CREA
TOPICAL_CREAM | Freq: Two times a day (BID) | CUTANEOUS | 2 refills | Status: DC
  Filled 2023-01-16: qty 454, 30d supply, fill #0

## 2023-01-16 MED ORDER — SPIRONOLACTONE 12.5 MG HALF TABLET: 12.5000 mg | ORAL_TABLET | Freq: Every day | ORAL | Status: DC

## 2023-01-16 NOTE — Progress Notes (Signed)
Mobility Specialist Progress Note:   01/16/23 1112  Mobility  Activity Ambulated with assistance in hallway  Level of Assistance Contact guard assist, steadying assist  Assistive Device Front wheel walker  Distance Ambulated (ft) 150 ft  Activity Response Tolerated well  Mobility Referral Yes  $Mobility charge 1 Mobility  Mobility Specialist Start Time (ACUTE ONLY) 1000  Mobility Specialist Stop Time (ACUTE ONLY) 1015  Mobility Specialist Time Calculation (min) (ACUTE ONLY) 15 min   Received pt in bed having no complaints and agreeable to mobility. Ambulated on 2L/min SPO2 95%, no SOB. Pt was asymptomatic throughout ambulation and returned to room w/o fault. Left in chair w/ call bell in reach and all needs met.   Thompson Grayer Mobility Specialist  Please contact vis Secure Chat or  Rehab Office [phone]

## 2023-01-16 NOTE — Progress Notes (Addendum)
   Heart Failure Stewardship Pharmacist Progress Note   PCP: [name], MD PCP-Cardiologist: [name], MD    HPI:  82 yo M with PMH of CHF, CAD, HTN, HLD, and CKD IIIa.   Recently admitted 10/11-10/12 for unstable angina. Underwent PCI with LAD stenting. PA 23, wedge 7, and LVEDP 18.   Readmitted 10/16-10/19 with acute on chronic CHF. ECHO with EF 65-70%, positive LVOT with high gradient. 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 Diuresed with IV lasix and started on Jardiance at discharge.   Was seen in follow up on 10/25 and was still reporting shortness of breath, LE edema, and weight gain. Unable to diurese as outpatient with worsening AKI. He was referred to the ED for readmission. CXR showed no pulmonary edema. BNP 134.   Current HF Medications: Diuretic: furosemide 40 mg IV BID Beta Blocker: metoprolol XL 25 mg daily SGLT2i: Jardiance 10 mg daily  Prior to admission HF Medications: Diuretic: furosemide 40 mg daily (BID PRN edema) Beta blocker: metoprolol tartrate 25 mg BID SGLT2i: Jardiance 10 mg daily Other: Imdur 30 mg daily  Pertinent Lab Values: Serum creatinine 1.40, BUN 31, Potassium 4.2, Sodium 133, BNP 134.3, Magnesium 2.3, A1c 5.5   Vital Signs: Weight: 199 lbs (admission weight: 206 lbs) Blood pressure: 110/60s  Heart rate: 50-60s  I/O: net -1.1L yesterday; net -7L since admission  Medication Assistance / Insurance Benefits Check: Does the patient have prescription insurance?  Yes Type of insurance plan: [employer] Medicare  Outpatient Pharmacy:  Prior to admission [facility] pharmacy: [facility] Is the patient willing to use [facility] TOC pharmacy at discharge? Yes Is the patient willing to transition their outpatient pharmacy to utilize a [facility] outpatient pharmacy?   Yes - would like to transition to [facility] mail order    Assessment: 1. 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 Positive LVOT with high gradient. NYHA class II-III symptoms. - Still volume overloaded, creatinine improved. Agree with restarting furosemide 40 mg IV BID. Down to home 2L O2 (was on 3L at home). Strict I/Os and daily weights. Keep K>4 and Mg>2. - Continue metoprolol XL 25 mg daily - Spironolactone now held 2/2 AKI. Consider restarting tomorrow if creatinine stable with restarting IV lasix - Continue Jardiance 10 mg daily - Holding PTA Imdur - would not resume at discharge  Plan: 1) Medication changes recommended at this time: - Restart spironolactone tomorrow if creatinine stable  2) Patient assistance: - Spoke with patient's daughter, Selena Batten, today and she is agreeable to using [facility] mail order after discharge. The patient and his wife are unable to drive now and it can be challenging to coordinate pickup of medications.   3)  Education  - Patient has been educated on current HF medications and potential additions to HF medication regimen - Patient verbalizes understanding that over the next few months, these medication doses may change and more medications may be added to optimize HF regimen - Patient has been educated on basic disease state pathophysiology and goals of therapy   Sharen Hones, PharmD, BCPS Heart Failure Stewardship Pharmacist Phone [phone]

## 2023-01-16 NOTE — Plan of Care (Signed)
  Problem: Education: Goal: Knowledge of General Education information will improve Description: Including pain rating scale, medication(s)/side effects and non-pharmacologic comfort measures Outcome: Progressing   Problem: Health Behavior/Discharge Planning: Goal: Ability to manage health-related needs will improve Outcome: Progressing   Problem: Clinical Measurements: Goal: Ability to maintain clinical measurements within normal limits will improve Outcome: Progressing   Problem: Clinical Measurements: Goal: Will remain free from infection Outcome: Progressing   Problem: Activity: Goal: Risk for activity intolerance will decrease Outcome: Progressing   Problem: Nutrition: Goal: Adequate nutrition will be maintained Outcome: Progressing   Problem: Coping: Goal: Level of anxiety will decrease Outcome: Progressing   Problem: Skin Integrity: Goal: Risk for impaired skin integrity will decrease Outcome: Progressing   Problem: Safety: Goal: Ability to remain free from injury will improve Outcome: Progressing

## 2023-01-16 NOTE — Plan of Care (Signed)
  Problem: Education: Goal: Ability to demonstrate management of disease process will improve Outcome: Progressing Goal: Ability to verbalize understanding of medication therapies will improve Outcome: Progressing Goal: Individualized Educational Video(s) Outcome: Progressing   Problem: Activity: Goal: Capacity to carry out activities will improve Outcome: Progressing   Problem: Cardiac: Goal: Ability to achieve and maintain adequate cardiopulmonary perfusion will improve Outcome: Progressing   Problem: Education: Goal: Knowledge of General Education information will improve Description: Including pain rating scale, medication(s)/side effects and non-pharmacologic comfort measures Outcome: Progressing   Problem: Health Behavior/Discharge Planning: Goal: Ability to manage health-related needs will improve Outcome: Progressing   Problem: Clinical Measurements: Goal: Ability to maintain clinical measurements within normal limits will improve Outcome: Progressing Goal: Will remain free from infection Outcome: Progressing Goal: Diagnostic test results will improve Outcome: Progressing Goal: Respiratory complications will improve Outcome: Progressing Goal: Cardiovascular complication will be avoided Outcome: Progressing   Problem: Activity: Goal: Risk for activity intolerance will decrease Outcome: Progressing   Problem: Nutrition: Goal: Adequate nutrition will be maintained Outcome: Progressing   Problem: Coping: Goal: Level of anxiety will decrease Outcome: Progressing   Problem: Elimination: Goal: Will not experience complications related to bowel motility Outcome: Progressing Goal: Will not experience complications related to urinary retention Outcome: Progressing   Problem: Pain Management: Goal: General experience of comfort will improve Outcome: Progressing   Problem: Safety: Goal: Ability to remain free from injury will improve Outcome: Progressing    Problem: Skin Integrity: Goal: Risk for impaired skin integrity will decrease Outcome: Progressing

## 2023-01-16 NOTE — TOC Initial Note (Addendum)
Transition of Care [facility]) - Initial/Assessment Note    Patient Details  Name: Jeffrey Acevedo MRN: [id number] Date of Birth: 1940-04-15  Transition [facility]) CM/SW Contact:    Leone Haven, RN Phone Number: 01/16/2023, 12:52 PM  Clinical Narrative:                 From home with spouse, has PCP and insurance on file, he is active with [facility] for HHPT, will need to add a HHRN for disease Management of CHF , Tresa Endo confirmed this with NCM , he has a shower stool and walker, home oxygen 3 liters  at home.  States family member will transport him home at [employer] and family is support system, states gets medications from [facility] in [place].  Pta self ambulatory.   Expected Discharge Plan: Home w Home Health Services Barriers to Discharge: Continued Medical Work up   Patient Goals and CMS Choice Patient states their goals for this hospitalization and ongoing recovery are:: return home CMS Medicare.gov Compare Post Acute Care list provided to:: Patient Choice offered to / list presented to : Patient      Expected Discharge Plan and Services In-house Referral: NA Discharge Planning Services: CM Consult Post Acute Care Choice: Home Health Living arrangements for the past 2 months: Single Family Home                 DME Arranged: N/A         HH Arranged: RN, PT, Disease Management HH Agency: [facility] Date HH Agency Contacted: 01/16/23 Time HH Agency Contacted: 1252 Representative spoke with at [facility] Agency: Tresa Endo  Prior Living Arrangements/Services Living arrangements for the past 2 months: Single Family Home Lives with:: Spouse Patient language and need for interpreter reviewed:: Yes Do you feel safe going back to the place where you live?: Yes      Need for Family Participation in Patient Care: Yes (Comment) Care giver support system in place?: Yes (comment) Current home services: DME (shower stool and walker) Criminal Activity/Legal Involvement  Pertinent to Current Situation/Hospitalization: No - Comment as needed  Activities of Daily Living   ADL Screening (condition at time of admission) Independently performs ADLs?: No Does the patient have a NEW difficulty with bathing/dressing/toileting/self-feeding that is expected to last >3 days?: Yes (Initiates electronic notice to provider for possible OT consult) Does the patient have a NEW difficulty with getting in/out of bed, walking, or climbing stairs that is expected to last >3 days?: Yes (Initiates electronic notice to provider for possible PT consult) Does the patient have a NEW difficulty with communication that is expected to last >3 days?: No Is the patient deaf or have difficulty hearing?: Yes Does the patient have difficulty seeing, even when wearing glasses/contacts?: Yes Does the patient have difficulty concentrating, remembering, or making decisions?: Yes  Permission Sought/Granted Permission sought to share information with : Case Manager Permission granted to share information with : Yes, Verbal Permission Granted              Emotional Assessment Appearance:: Appears stated age Attitude/Demeanor/Rapport: Engaged Affect (typically observed): Appropriate Orientation: : Oriented to Self, Oriented to Place, Oriented to  Time, Oriented to Situation Alcohol / Substance Use: Not Applicable Psych Involvement: No (comment)  Admission diagnosis:  SOB (shortness of breath) [R06.02] Renal insufficiency [N28.9] Hypotension, unspecified hypotension type [I95.9] Acute on chronic diastolic (congestive) heart failure (HCC) [I50.33] Patient Active Problem List   Diagnosis Date Noted   Malnutrition of moderate  degree 01/16/2023   Acute on chronic diastolic (congestive) heart failure (HCC) 01/12/2023   Chronic hypoxic respiratory failure (HCC) 01/03/2023   Acute kidney injury superimposed on chronic kidney disease (HCC) 01/03/2023   Allergic rhinitis 01/03/2023   Unstable  angina (HCC) 12/29/2022   Status post coronary artery stent placement    CAD S/P percutaneous coronary angioplasty 05/02/2021   Dyspnea on exertion    Palpitations 03/25/2021   Primary osteoarthritis of right hip 04/19/2015   Essential hypertension 11/06/2012   Hyperlipidemia 11/06/2012   Coronary artery disease 11/06/2012   PCP:  [name], MD Pharmacy:   [facility] [address] [place] [address] Phone: [phone] Fax: [phone]  [facility] [address] [place] [address] Phone: [phone] Fax: [phone]     Social Determinants of Health (SDOH) Social History: SDOH Screenings   Food Insecurity: No Food Insecurity (01/12/2023)  Housing: Low Risk  (01/12/2023)  Transportation Needs: No Transportation Needs (01/12/2023)  Recent Concern: Transportation Needs - Unmet Transportation Needs (12/12/2022)   Received from [facility]  Utilities: Not At Risk (01/12/2023)  Alcohol Screen: Low Risk  (01/15/2023)  Financial Resource Strain: Low Risk  (01/15/2023)  Tobacco Use: Low Risk  (01/12/2023)   SDOH Interventions: Alcohol Usage Interventions: Intervention Not Indicated (Score <7) Financial Strain Interventions: Intervention Not Indicated   Readmission Risk Interventions     No data to display

## 2023-01-16 NOTE — Progress Notes (Signed)
PROGRESS NOTE    Jeffrey Acevedo  CZY:[id number] DOB: August 31, 1940 DOA: 01/12/2023 PCP: [name], MD  82/M with history of diastolic CHF, HOCM, CAD, CKD 2, dyslipidemia presented to the ED with worsening shortness of breath, edema and weight gain. -Recently hospitalized for CAD on 10/11, treated with PCI and stent placement, subsequently hospitalized 10/16-10/19 with AKI and diastolic CHF, diuresed stabilized and discharged home on Lasix, Jardiance. -Seen in cardiology clinic was noted to be hypoxic and volume overloaded, sent to the ED for admission. 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 -Volume status improving, diuresed with IV Lasix, 7.1 L negative -Cards following, restarted on IV Lasix today -Continue Jardiance and metoprolol,  -gets Meals on Wheels every day, discussed importance of limiting processed food, dietitian consult -Attempt to wean O2, was discharged on oxygen recently following CHF exacerbation  AKI/CKD 2 -Stable, monitor   Hypertension -Continue metoprolol   CAD -Recent PCI and LAD stent earlier this month  - Continue aspirin, Plavix, Plavix fenofibrate -  hold Imdur   Hyperlipidemia - Continue fenofibrate  Mild hyperglycemia A1c 5.5  DVT prophylaxis: Lovenox Code Status: Full code Family Communication: None present, called and updated daughter Disposition Plan: Home tomorrow  Consultants:    Procedures:   Antimicrobials:    Objective: Vitals:   01/16/23 0445 01/16/23 0720 01/16/23 0920 01/16/23 1310  BP: 112/60 101/60 112/80 112/60  Pulse: 62 (!) 59 65 65  Resp: 19 (!) 22  (!) 22  Temp: 98 F (36.7 C) 97.7 F (36.5 C)  97.7 F (36.5 C)  TempSrc: Oral Oral  Oral  SpO2: 91% 93%  97%  Weight:       Height:        Intake/Output Summary (Last 24 hours) at 01/16/2023 1442 Last data filed at 01/16/2023 1300 Gross per 24 hour  Intake 363 ml  Output 1400 ml  Net -1037 ml   Filed Weights   01/14/23 0500 01/15/23 0440 01/16/23 0443  Weight: 89.6 kg 91.9 kg 90.5 kg    Examination:  General exam: AAOx3, no distress HEENT: Positive JVD CVS: S1-S2, regular rhythm Lungs: Clear bilaterally Abdomen: Soft, nontender, bowel sounds present Extremities: 1+ edema  Skin: No rashes Psychiatry:  Mood & affect appropriate.     Data Reviewed:   CBC: Recent Labs  Lab 01/12/23 1104 01/13/23 0233  WBC 9.0 6.4  HGB 10.5* 9.4*  HCT 34.1* 29.4*  MCV 84.2 82.6  PLT 230 198   Basic Metabolic Panel: Recent Labs  Lab 01/12/23 1104 01/13/23 0233 01/14/23 0248 01/15/23 0300 01/16/23 0334  NA 136 134* 131* 133* 133*  K 4.0 4.1 3.9 4.0 4.2  CL 99 96* 94* 93* 95*  CO2 28 28 30 29 29   GLUCOSE 102* 104* 102* 102* 96  BUN 26* 27* 26* 32* 31*  CREATININE 1.44* 1.36* 1.48* 1.68* 1.40*  CALCIUM 9.1 8.6* 8.6* 9.1 9.0  MG 2.3  --   --   --   --    GFR: Estimated Creatinine Clearance: 45.4 mL/min (A) (by C-G formula based on SCr of 1.4 mg/dL (H)). Liver Function Tests: Recent Labs  Lab 01/13/23 0233  AST 27  ALT 15  ALKPHOS 33*  BILITOT 0.8  PROT 5.1*  ALBUMIN 2.5*   No results for  input(s): "LIPASE", "AMYLASE" in the last 168 hours. No results for input(s): "AMMONIA" in the last 168 hours. Coagulation Profile: No results for input(s): "INR", "PROTIME" in the last 168 hours. Cardiac Enzymes: No results for input(s): "CKTOTAL", "CKMB", "CKMBINDEX", "TROPONINI" in the last 168 hours. BNP (last 3 results) No results for input(s): "PROBNP" in the last 8760 hours. HbA1C: No results for input(s): "HGBA1C" in the last 72 hours.  CBG: No results for input(s): "GLUCAP" in the last 168 hours. Lipid Profile: Recent Labs    01/15/23 0258  CHOL 70  HDL <10*  LDLCALC NOT  CALCULATED  TRIG 273*  CHOLHDL NOT CALCULATED   Thyroid Function Tests: No results for input(s): "TSH", "T4TOTAL", "FREET4", "T3FREE", "THYROIDAB" in the last 72 hours. Anemia Panel: No results for input(s): "VITAMINB12", "FOLATE", "FERRITIN", "TIBC", "IRON", "RETICCTPCT" in the last 72 hours. Urine analysis:    Component Value Date/Time   COLORURINE YELLOW 01/04/2023 0040   APPEARANCEUR CLEAR 01/04/2023 0040   LABSPEC 1.010 01/04/2023 0040   PHURINE 5.0 01/04/2023 0040   GLUCOSEU NEGATIVE 01/04/2023 0040   HGBUR MODERATE (A) 01/04/2023 0040   BILIRUBINUR NEGATIVE 01/04/2023 0040   KETONESUR NEGATIVE 01/04/2023 0040   PROTEINUR NEGATIVE 01/04/2023 0040   NITRITE NEGATIVE 01/04/2023 0040   LEUKOCYTESUR NEGATIVE 01/04/2023 0040   Sepsis Labs: @LABRCNTIP (procalcitonin:4,lacticidven:4)  )No results found for this or any previous visit (from the past 240 hour(s)).   Radiology Studies: No results found.   Scheduled Meds:  aspirin EC  81 mg Oral Daily   atorvastatin  40 mg Oral Daily   clopidogrel  75 mg Oral Daily   empagliflozin  10 mg Oral Daily   enoxaparin (LOVENOX) injection  40 mg Subcutaneous Q24H   feeding supplement  237 mL Oral BID BM   fenofibrate  54 mg Oral Daily   fluticasone  1 spray Each Nare Daily   furosemide  40 mg Intravenous BID   metoprolol succinate  25 mg Oral Daily   multivitamin with minerals  1 tablet Oral Daily   sertraline  25 mg Oral Daily   sodium chloride flush  3 mL Intravenous Q12H   Continuous Infusions:   LOS: 3 days    Time spent:    Zannie Cove, MD [facility]   01/16/2023, 2:42 PM

## 2023-01-16 NOTE — Progress Notes (Signed)
   Patient Name: Jeffrey Acevedo Date of Encounter: 01/16/2023 [facility] Cardiologist: [name], MD   Interval Summary  .    Recently admitted October 11 for unstable angina and underwent PCI to LAD with wedge pressure of 7 LVEDP of 18 and then he was readmitted again on 10/16 with acute on chronic heart failure diastolic with EF of 70% with LVOT gradient started on Jardiance he was then seen in follow-up on 10/25 reporting shortness of breath unable to diurese as outpatient with worsening AKI.  BNP was 134 on admission.  Chest x-ray showed pulmonary edema.  Sitting in chair, was able to go down the hallway.  On low-dose oxygen nasal cannula.  Still has lower extremity edema.  Feeling a little bit better.  Vital Signs .    Vitals:   01/16/23 0443 01/16/23 0445 01/16/23 0720 01/16/23 0920  BP: 112/60 112/60 101/60 112/80  Pulse: 64 62 (!) 59 65  Resp: 19 19 (!) 22   Temp: 98.4 F (36.9 C) 98 F (36.7 C) 97.7 F (36.5 C)   TempSrc: Oral Oral Oral   SpO2: 92% 91% 93%   Weight: 90.5 kg     Height:        Intake/Output Summary (Last 24 hours) at 01/16/2023 1023 Last data filed at 01/16/2023 0925 Gross per 24 hour  Intake 363 ml  Output 1800 ml  Net -1437 ml      01/16/2023    4:43 AM 01/15/2023    4:40 AM 01/14/2023    5:00 AM  Last 3 Weights  Weight (lbs) 199 lb 8.3 oz 202 lb 9.6 oz 197 lb 8.5 oz  Weight (kg) 90.5 kg 91.9 kg 89.6 kg      Telemetry/ECG    No adverse arrhythmias, sinus rhythm 60s- Personally Reviewed  Physical Exam .   GEN: No acute distress.   Neck: No JVD Cardiac: RRR, 1/6 systolic murmur, rubs, or gallops.  Respiratory: Clear to auscultation bilaterally. GI: Soft, nontender, non-distended  MS: 2+ left greater than right edema  Assessment & Plan .     Acute on chronic diastolic heart failure EF 70%, hyperdynamic LVOT gradient -IV Lasix was administered initially on admission but discontinued secondary to AKI.  Yesterday once  again overall reasonable diuresis of -1.4 L net. -We will go ahead and give another day of IV Lasix restart 40 mg twice a day - Continue with Toprol 25 mg a day - Continue with Jardiance 10 mg a day - Spironolactone held as well because of AKI -Compression hose, watch salt with meals on wheels  Coronary artery disease mid LAD stent October 2024 - Continue with dual antiplatelet therapy  Hyperlipidemia - Atorvastatin 40 mg added, also has fenofibrate 54 mg. -Prior LDL 56  Chronic kidney disease stage IIIa - It seems like creatinine has been in the 1.4 range over the last few months.  This may be his new baseline. For questions or updates, please contact [facility] Please consult [email or web address] for contact info under        Signed, Donato Schultz, MD

## 2023-01-17 ENCOUNTER — Other Ambulatory Visit (HOSPITAL_COMMUNITY): Payer: Self-pay

## 2023-01-17 ENCOUNTER — Other Ambulatory Visit: Payer: Self-pay

## 2023-01-17 DIAGNOSIS — I5033 Acute on chronic diastolic (congestive) heart failure: Secondary | ICD-10-CM | POA: Diagnosis not present

## 2023-01-17 LAB — BASIC METABOLIC PANEL
Anion gap: 6 (ref 5–15)
BUN: 30 mg/dL — ABNORMAL HIGH (ref 8–23)
CO2: 30 mmol/L (ref 22–32)
Calcium: 8.7 mg/dL — ABNORMAL LOW (ref 8.9–10.3)
Chloride: 97 mmol/L — ABNORMAL LOW (ref 98–111)
Creatinine, Ser: 1.42 mg/dL — ABNORMAL HIGH (ref 0.61–1.24)
GFR, Estimated: 50 mL/min — ABNORMAL LOW (ref >60)
Glucose, Bld: 113 mg/dL — ABNORMAL HIGH (ref 70–99)
Potassium: 4 mmol/L (ref 3.5–5.1)
Sodium: 133 mmol/L — ABNORMAL LOW (ref 135–145)

## 2023-01-17 LAB — VITAMIN B12: Vitamin B-12: 524 pg/mL (ref 180–914)

## 2023-01-17 LAB — RETICULOCYTES
Immature Retic Fract: 16.7 % — ABNORMAL HIGH (ref 2.3–15.9)
RBC.: 3.78 MIL/uL — ABNORMAL LOW (ref 4.22–5.81)
Retic Count, Absolute: 148.6 10*3/uL (ref 19.0–186.0)
Retic Ct Pct: 3.9 % — ABNORMAL HIGH (ref 0.4–3.1)

## 2023-01-17 LAB — IRON AND TIBC
Iron: 34 ug/dL — ABNORMAL LOW (ref 45–182)
Saturation Ratios: 9 % — ABNORMAL LOW (ref 17.9–39.5)
TIBC: 374 ug/dL (ref 250–450)
UIBC: 340 ug/dL

## 2023-01-17 LAB — FOLATE: Folate: 7.9 ng/mL (ref >5.9)

## 2023-01-17 LAB — FERRITIN: Ferritin: 1002 ng/mL — ABNORMAL HIGH (ref 24–336)

## 2023-01-17 MED ORDER — CLOPIDOGREL BISULFATE 75 MG PO TABS: 75.0000 mg | ORAL_TABLET | Freq: Every day | ORAL | 3 refills | Status: DC

## 2023-01-17 MED ORDER — FUROSEMIDE 20 MG PO TABS: 20.0000 mg | ORAL_TABLET | Freq: Every day | ORAL | 2 refills | Status: DC

## 2023-01-17 MED ORDER — SERTRALINE HCL 25 MG PO TABS: 25.0000 mg | ORAL_TABLET | Freq: Every day | ORAL | 3 refills | Status: DC

## 2023-01-17 NOTE — Progress Notes (Signed)
Physical Therapy Treatment Patient Details Name: Jeffrey Acevedo MRN: [id number] DOB: 03/24/1940 Today's Date: 01/17/2023   History of Present Illness 82 yo male presents to [facility] on 10/25 with fluid overload and SOB, workup for HF exacerbation. Recent admission 10/16-10/19 for AKI, HF exacerbation, respiratory failure. PMH includes HTN, HLD, CAD s/p stents, CKDII, dCHF.    PT Comments  Pt progressing showing improvements with transfers and ambulation requiring CGA for cueing and safety throughout treatment. Will continue to follow acutely to further progress, would benefit from continued therapy services in the home.    If plan is discharge home, recommend the following: A little help with walking and/or transfers;A little help with bathing/dressing/bathroom   Can travel by private vehicle        Equipment Recommendations  None recommended by PT    Recommendations for Other Services       Precautions / Restrictions Precautions Precautions: Fall Precaution Comments: on 3LO2 chronically since stents, pt on 1L O2 in bed at begining of session Restrictions Weight Bearing Restrictions: No     Mobility  Bed Mobility Overal bed mobility: Needs Assistance Bed Mobility: Supine to Sit     Supine to sit: HOB elevated, Used rails, Supervision     General bed mobility comments: Pt arose to seated position with use of bedrail to initiate mobility and HOB elevated top 30degrees    Transfers Overall transfer level: Needs assistance Equipment used: Rolling walker (2 wheels) Transfers: Sit to/from Stand Sit to Stand: Contact guard assist           General transfer comment: CGA for tactile cueing during stand, verbal cueing for hand placement and to avoid pulling on walker    Ambulation/Gait Ambulation/Gait assistance: Contact guard assist Gait Distance (Feet): 250 Feet Assistive device: Rolling walker (2 wheels) Gait Pattern/deviations: Step-through pattern, Decreased stride  length, Trunk flexed Gait velocity: decr Gait velocity interpretation: <1.8 ft/sec, indicate of risk for recurrent falls   General Gait Details: CGA for safety and cueing to stay within BOS of RW, 2 <10 sec therapist initiated breaks during ambulation to check SPO2% 96% on 1L O2 via nasal cannula   Stairs             Wheelchair Mobility     Tilt Bed    Modified Rankin (Stroke Patients Only)       Balance Overall balance assessment: Needs assistance, History of Falls Sitting-balance support: No upper extremity supported, Feet supported Sitting balance-Leahy Scale: Fair Sitting balance - Comments: Seated on EOB with no UE support 1 min forward trunk   Standing balance support: Bilateral upper extremity supported, During functional activity Standing balance-Leahy Scale: Poor Standing balance comment: reliant on external support                            Cognition Arousal: Alert Behavior During Therapy: WFL for tasks assessed/performed Overall Cognitive Status: Within Functional Limits for tasks assessed                                          Exercises Other Exercises Other Exercises: Sit to stands (2x5)    General Comments General comments (skin integrity, edema, etc.): 5xSTS 30.77 sec      Pertinent Vitals/Pain Pain Assessment Pain Assessment: No/denies pain    Home Living  Prior Function            PT Goals (current goals can now be found in the care plan section) Acute Rehab PT Goals Patient Stated Goal: home PT Goal Formulation: With patient Time For Goal Achievement: 01/29/23 Potential to Achieve Goals: Good Progress towards PT goals: Progressing toward goals    Frequency    Min 1X/week      PT Plan      Co-evaluation              AM-PAC PT "6 Clicks" Mobility   Outcome Measure  Help needed turning from your back to your side while in a flat bed without using  bedrails?: A Little Help needed moving from lying on your back to sitting on the side of a flat bed without using bedrails?: A Little Help needed moving to and from a bed to a chair (including a wheelchair)?: A Little Help needed standing up from a chair using your arms (e.g., wheelchair or bedside chair)?: A Little Help needed to walk in hospital room?: A Little Help needed climbing 3-5 steps with a railing? : A Little 6 Click Score: 18    End of Session Equipment Utilized During Treatment: Gait belt Activity Tolerance: Patient tolerated treatment well Patient left: in chair;with call bell/phone within reach Nurse Communication: Mobility status PT Visit Diagnosis: Other abnormalities of gait and mobility (R26.89);Muscle weakness (generalized) (M62.81)     Time: 4782-9562 PT Time Calculation (min) (ACUTE ONLY): 35 min  Charges:    $Gait Training: 8-22 mins $Therapeutic Activity: 8-22 mins PT General Charges $$ ACUTE PT VISIT: 1 Visit                     Andrey Farmer SPT Secure chat preferred\   Darlin Drop 01/17/2023, 10:51 AM

## 2023-01-17 NOTE — Progress Notes (Signed)
   Patient Name: Jeffrey Acevedo Date of Encounter: 01/17/2023 [facility] Cardiologist: [name], MD   Interval Summary  .    Sitting in chair, feeling little bit better.  Fluid has improved.  2.1 L out yesterday 1.8 L the day before.  86.3 kg currently.  Creatinine 1.42 down from 1.68.  Vital Signs .    Vitals:   01/17/23 0249 01/17/23 0445 01/17/23 0725 01/17/23 0900  BP: 121/81 (!) 98/50 (!) 109/56   Pulse: (!) 57 (!) 56 65   Resp: 19 19 (!) 23   Temp: 98.3 F (36.8 C) 98 F (36.7 C) 98.1 F (36.7 C)   TempSrc: Oral Oral Oral   SpO2: 93% 93% 94% 96%  Weight:  86.3 kg    Height:        Intake/Output Summary (Last 24 hours) at 01/17/2023 1031 Last data filed at 01/17/2023 1610 Gross per 24 hour  Intake 598 ml  Output 2500 ml  Net -1902 ml      01/17/2023    4:45 AM 01/16/2023    4:43 AM 01/15/2023    4:40 AM  Last 3 Weights  Weight (lbs) 190 lb 3.2 oz 199 lb 8.3 oz 202 lb 9.6 oz  Weight (kg) 86.274 kg 90.5 kg 91.9 kg      Telemetry/ECG    SR 60 - Personally Reviewed  Physical Exam .   GEN: No acute distress.   Neck: No JVD Cardiac: 1/6 SM, no rubs, or gallops.  Respiratory: Clear to auscultation bilaterally. GI: Soft, nontender, non-distended  MS: R>L edema improved  Assessment & Plan .     82 year old with acute on chronic diastolic heart failure ejection fraction 70% with hyperdynamic LVOT gradient - I have placed him back on IV Lasix 40 mg twice a day and we will continue again today.  Fluid has improved.  Left leg looks a lot better than right but he does state that his right leg is usually swollen.  Coronary artery disease - Mid LAD stent October 2024 continue with dual antiplatelet therapy.  No anginal symptoms.  Hyperlipidemia atorvastatin 40 mg added also with fenofibrate 54 mg with prior LDL of 56.  Chronic kidney disease stage IIIa seems like creatinine has been in the 1.4 range over the last few months this may be his  new baseline.  For questions or updates, please contact [facility] Please consult [email or web address] for contact info under        Signed, Donato Schultz, MD

## 2023-01-17 NOTE — Progress Notes (Signed)
Mobility Specialist Progress Note:   01/17/23 1628  Mobility  Activity Ambulated with assistance in hallway  Level of Assistance Contact guard assist, steadying assist  Assistive Device Front wheel walker  Distance Ambulated (ft) 150 ft  Activity Response Tolerated well  Mobility Referral Yes  $Mobility charge 1 Mobility  Mobility Specialist Start Time (ACUTE ONLY) 1530  Mobility Specialist Stop Time (ACUTE ONLY) 1551  Mobility Specialist Time Calculation (min) (ACUTE ONLY) 21 min   Received pt in chair having no complaints and agreeable to mobility. Pt was asymptomatic throughout ambulation and returned to room w/o fault. Needed corrections on walker proximity, otherwise performed very well. Left in chair w/ call bell in reach and all needs met.   Thompson Grayer Mobility Specialist  Please contact vis Secure Chat or  Rehab Office [phone]

## 2023-01-17 NOTE — Care Management Important Message (Signed)
Important Message  Patient Details  Name: Jeffrey Acevedo MRN: [id number] Date of Birth: 1940-12-03   Important Message Given:  Yes - Medicare IM     Dorena Bodo 01/17/2023, 2:57 PM

## 2023-01-17 NOTE — Plan of Care (Signed)
  Problem: Activity: Goal: Capacity to carry out activities will improve Outcome: Progressing   Problem: Cardiac: Goal: Ability to achieve and maintain adequate cardiopulmonary perfusion will improve Outcome: Progressing   Problem: Activity: Goal: Risk for activity intolerance will decrease Outcome: Progressing   Problem: Nutrition: Goal: Adequate nutrition will be maintained Outcome: Progressing   Problem: Coping: Goal: Level of anxiety will decrease Outcome: Progressing   Problem: Elimination: Goal: Will not experience complications related to bowel motility Outcome: Progressing   Problem: Elimination: Goal: Will not experience complications related to urinary retention Outcome: Progressing

## 2023-01-17 NOTE — Progress Notes (Addendum)
   Heart Failure Stewardship Pharmacist Progress Note   PCP: [name], MD PCP-Cardiologist: [name], MD    HPI:  82 yo M with PMH of CHF, CAD, HTN, HLD, and CKD IIIa.   Recently admitted 10/11-10/12 for unstable angina. Underwent PCI with LAD stenting. PA 23, wedge 7, and LVEDP 18.   Readmitted 10/16-10/19 with acute on chronic CHF. ECHO with EF 65-70%, positive LVOT with high gradient. 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 Diuresed with IV lasix and started on Jardiance at discharge.   Was seen in follow up on 10/25 and was still reporting shortness of breath, LE edema, and weight gain. Unable to diurese as outpatient with worsening AKI. He was referred to the ED for readmission. CXR showed no pulmonary edema. BNP 134.   Current HF Medications: Diuretic: furosemide 40 mg IV BID Beta Blocker: metoprolol XL 25 mg daily SGLT2i: Jardiance 10 mg daily  Prior to admission HF Medications: Diuretic: furosemide 40 mg daily (BID PRN edema) Beta blocker: metoprolol tartrate 25 mg BID SGLT2i: Jardiance 10 mg daily Other: Imdur 30 mg daily  Pertinent Lab Values: Serum creatinine 1.42, BUN 30, Potassium 4.0, Sodium 133, BNP 134.3, Magnesium 2.3, A1c 5.5   Vital Signs: Weight: 190 lbs (admission weight: 206 lbs) Blood pressure: 100-120/60s  Heart rate: 50-60s  I/O: net -1.3L yesterday; net -8.9L since admission  Medication Assistance / Insurance Benefits Check: Does the patient have prescription insurance?  Yes Type of insurance plan: [employer]  Outpatient Pharmacy:  Prior to admission outpatient pharmacy: [facility] Is the patient willing to use [facility] TOC pharmacy at discharge? Yes Is the patient willing to transition their outpatient pharmacy to utilize a [facility] outpatient pharmacy?   Yes - would like to transition to [facility] mail order    Assessment: 1. 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 Positive LVOT with high gradient. NYHA class II-III symptoms. - Continue furosemide 40 mg IV BID. Down to home 1L O2 (was on 3L at home). Still has LE edema R>L. Strict I/Os and daily weights. Keep K>4 and Mg>2. - Continue metoprolol XL 25 mg daily - Spironolactone now held 2/2 AKI. Consider restarting today now that creatinine is improved.  - Continue Jardiance 10 mg daily - Holding PTA Imdur - would not resume at discharge  Plan: 1) Medication changes recommended at this time: - Restart spironolactone 12.5 mg daily  2) Patient assistance: - Spoke with patient's daughter, Selena Batten, and she is agreeable to using [facility] mail order after discharge. The patient and his wife are unable to drive now and it can be challenging to coordinate pickup of medications.   3)  Education  - Patient has been educated on current HF medications and potential additions to HF medication regimen - Patient verbalizes understanding that over the next few months, these medication doses may change and more medications may be added to optimize HF regimen - Patient has been educated on basic disease state pathophysiology and goals of therapy   Sharen Hones, PharmD, BCPS Heart Failure Stewardship Pharmacist Phone [phone]

## 2023-01-17 NOTE — Progress Notes (Signed)
PROGRESS NOTE    Jeffrey Acevedo  ZHY:[id number] DOB: [birth year] DOA: 01/12/2023 PCP: [name], MD  82/M with history of diastolic CHF, HOCM, CAD, CKD 2, dyslipidemia presented to the ED with worsening shortness of breath, edema and weight gain. -Recently hospitalized for CAD on 10/11, treated with PCI and stent placement, subsequently hospitalized 10/16-10/19 with AKI and diastolic CHF, diuresed stabilized and discharged home on Lasix, Jardiance. -Seen in cardiology clinic was noted to be hypoxic and volume overloaded, sent to the ED for admission. 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 -Volume status improving, diuresed with IV Lasix, 8.7 L negative -Cards following, continue IV Lasix 1 more day -Continue Jardiance and metoprolol,  -gets Meals on Wheels every day, discussed importance of limiting processed food, dietitian consult -Attempt to wean O2, was discharged on oxygen recently following CHF exacerbation last week, may not need this long-term, TOC follow-up made as  AKI/CKD 2 -Stable, monitor   Hypertension -Continue metoprolol   CAD -Recent PCI and LAD stent earlier this month  - Continue aspirin, Plavix, Plavix fenofibrate -  hold Imdur   Hyperlipidemia - Continue fenofibrate  Mild hyperglycemia A1c 5.5  DVT prophylaxis: Lovenox Code Status: Full code Family Communication: None present, called and updated daughter 10/28, 10/30 Disposition Plan: Home tomorrow  Consultants:    Procedures:   Antimicrobials:    Objective: Vitals:   01/17/23 0249 01/17/23 0445 01/17/23 0725 01/17/23 0900  BP: 121/81 (!) 98/50 (!) 109/56   Pulse: (!) 57 (!) 56 65   Resp: 19 19 (!) 23   Temp: 98.3 F (36.8  C) 98 F (36.7 C) 98.1 F (36.7 C)   TempSrc: Oral Oral Oral   SpO2: 93% 93% 94% 96%  Weight:  86.3 kg    Height:        Intake/Output Summary (Last 24 hours) at 01/17/2023 1105 Last data filed at 01/17/2023 2952 Gross per 24 hour  Intake 598 ml  Output 2500 ml  Net -1902 ml   Filed Weights   01/15/23 0440 01/16/23 0443 01/17/23 0445  Weight: 91.9 kg 90.5 kg 86.3 kg    Examination:  General exam: AAOx3, no distress HEENT: Positive JVD CVS: S1-S2, regular rhythm Lungs: Clear bilaterally Abdomen: Soft, nontender, bowel sounds present Extremities: 1+ edema  Skin: No rashes Psychiatry:  Mood & affect appropriate.     Data Reviewed:   CBC: Recent Labs  Lab 01/12/23 1104 01/13/23 0233  WBC 9.0 6.4  HGB 10.5* 9.4*  HCT 34.1* 29.4*  MCV 84.2 82.6  PLT 230 198   Basic Metabolic Panel: Recent Labs  Lab 01/12/23 1104 01/13/23 0233 01/14/23 0248 01/15/23 0300 01/16/23 0334 01/17/23 0409  NA 136 134* 131* 133* 133* 133*  K 4.0 4.1 3.9 4.0 4.2 4.0  CL 99 96* 94* 93* 95* 97*  CO2 28 28 30 29 29 30   GLUCOSE 102* 104* 102* 102* 96 113*  BUN 26* 27* 26* 32* 31* 30*  CREATININE 1.44* 1.36* 1.48* 1.68* 1.40* 1.42*  CALCIUM 9.1 8.6* 8.6* 9.1 9.0 8.7*  MG 2.3  --   --   --   --   --    GFR: Estimated Creatinine Clearance: 44.8 mL/min (A) (by C-G formula based on  SCr of 1.42 mg/dL (H)). Liver Function Tests: Recent Labs  Lab 01/13/23 0233  AST 27  ALT 15  ALKPHOS 33*  BILITOT 0.8  PROT 5.1*  ALBUMIN 2.5*   No results for input(s): "LIPASE", "AMYLASE" in the last 168 hours. No results for input(s): "AMMONIA" in the last 168 hours. Coagulation Profile: No results for input(s): "INR", "PROTIME" in the last 168 hours. Cardiac Enzymes: No results for input(s): "CKTOTAL", "CKMB", "CKMBINDEX", "TROPONINI" in the last 168 hours. BNP (last 3 results) No results for input(s): "PROBNP" in the last 8760 hours. HbA1C: No results for input(s): "HGBA1C" in the last  72 hours.  CBG: No results for input(s): "GLUCAP" in the last 168 hours. Lipid Profile: Recent Labs    01/15/23 0258  CHOL 70  HDL <10*  LDLCALC NOT CALCULATED  TRIG 273*  CHOLHDL NOT CALCULATED   Thyroid Function Tests: No results for input(s): "TSH", "T4TOTAL", "FREET4", "T3FREE", "THYROIDAB" in the last 72 hours. Anemia Panel: Recent Labs    01/17/23 0931  RETICCTPCT 3.9*   Urine analysis:    Component Value Date/Time   COLORURINE YELLOW 01/04/2023 0040   APPEARANCEUR CLEAR 01/04/2023 0040   LABSPEC 1.010 01/04/2023 0040   PHURINE 5.0 01/04/2023 0040   GLUCOSEU NEGATIVE 01/04/2023 0040   HGBUR MODERATE (A) 01/04/2023 0040   BILIRUBINUR NEGATIVE 01/04/2023 0040   KETONESUR NEGATIVE 01/04/2023 0040   PROTEINUR NEGATIVE 01/04/2023 0040   NITRITE NEGATIVE 01/04/2023 0040   LEUKOCYTESUR NEGATIVE 01/04/2023 0040   Sepsis Labs: @LABRCNTIP (procalcitonin:4,lacticidven:4)  )No results found for this or any previous visit (from the past 240 hour(s)).   Radiology Studies: No results found.   Scheduled Meds:  aspirin EC  81 mg Oral Daily   atorvastatin  40 mg Oral Daily   clopidogrel  75 mg Oral Daily   empagliflozin  10 mg Oral Daily   enoxaparin (LOVENOX) injection  40 mg Subcutaneous Q24H   feeding supplement  237 mL Oral BID BM   fenofibrate  54 mg Oral Daily   fluticasone  1 spray Each Nare Daily   furosemide  40 mg Intravenous BID   metoprolol succinate  25 mg Oral Daily   multivitamin with minerals  1 tablet Oral Daily   sertraline  25 mg Oral Daily   sodium chloride flush  3 mL Intravenous Q12H   Continuous Infusions:   LOS: 4 days    Time spent:    Zannie Cove, MD [facility]   01/17/2023, 11:05 AM

## 2023-01-18 ENCOUNTER — Other Ambulatory Visit: Payer: Self-pay

## 2023-01-18 ENCOUNTER — Other Ambulatory Visit (HOSPITAL_COMMUNITY): Payer: Self-pay

## 2023-01-18 DIAGNOSIS — D649 Anemia, unspecified: Secondary | ICD-10-CM

## 2023-01-18 DIAGNOSIS — I2510 Atherosclerotic heart disease of native coronary artery without angina pectoris: Secondary | ICD-10-CM | POA: Diagnosis not present

## 2023-01-18 DIAGNOSIS — E440 Moderate protein-calorie malnutrition: Secondary | ICD-10-CM

## 2023-01-18 DIAGNOSIS — I5033 Acute on chronic diastolic (congestive) heart failure: Secondary | ICD-10-CM | POA: Diagnosis not present

## 2023-01-18 DIAGNOSIS — I10 Essential (primary) hypertension: Secondary | ICD-10-CM | POA: Diagnosis not present

## 2023-01-18 DIAGNOSIS — N179 Acute kidney failure, unspecified: Secondary | ICD-10-CM | POA: Diagnosis not present

## 2023-01-18 LAB — BASIC METABOLIC PANEL
Anion gap: 8 (ref 5–15)
BUN: 34 mg/dL — ABNORMAL HIGH (ref 8–23)
CO2: 28 mmol/L (ref 22–32)
Calcium: 8.7 mg/dL — ABNORMAL LOW (ref 8.9–10.3)
Chloride: 97 mmol/L — ABNORMAL LOW (ref 98–111)
Creatinine, Ser: 1.47 mg/dL — ABNORMAL HIGH (ref 0.61–1.24)
GFR, Estimated: 48 mL/min — ABNORMAL LOW (ref >60)
Glucose, Bld: 102 mg/dL — ABNORMAL HIGH (ref 70–99)
Potassium: 3.8 mmol/L (ref 3.5–5.1)
Sodium: 133 mmol/L — ABNORMAL LOW (ref 135–145)

## 2023-01-18 MED ORDER — FERROUS SULFATE 325 (65 FE) MG PO TABS
325.0000 mg | ORAL_TABLET | Freq: Every day | ORAL | 0 refills | Status: DC
  Filled 2023-01-18: qty 30, 30d supply, fill #0

## 2023-01-18 MED ORDER — SPIRONOLACTONE 25 MG PO TABS
12.5000 mg | ORAL_TABLET | Freq: Every day | ORAL | 0 refills | Status: DC
  Filled 2023-01-18: qty 15, 30d supply, fill #0

## 2023-01-18 MED ORDER — NITROGLYCERIN 0.4 MG SL SUBL: 0.4000 mg | SUBLINGUAL_TABLET | SUBLINGUAL | 3 refills | Status: DC | PRN

## 2023-01-18 MED ORDER — FUROSEMIDE 40 MG PO TABS: 40.0000 mg | ORAL_TABLET | Freq: Two times a day (BID) | ORAL | Status: DC

## 2023-01-18 MED ORDER — FENOFIBRIC ACID 45 MG PO CPDR
45.0000 mg | DELAYED_RELEASE_CAPSULE | Freq: Every day | ORAL | 1 refills | Status: DC
  Filled 2023-01-18: qty 90, 90d supply, fill #0

## 2023-01-18 MED ORDER — FERROUS SULFATE 325 (65 FE) MG PO TABS: 325.0000 mg | ORAL_TABLET | Freq: Every day | ORAL | Status: DC

## 2023-01-18 MED ORDER — ENSURE ENLIVE PO LIQD
237.0000 mL | Freq: Two times a day (BID) | ORAL | 0 refills | Status: AC
  Filled 2023-01-18: qty 14220, 30d supply, fill #0

## 2023-01-18 MED ORDER — FUROSEMIDE 40 MG PO TABS
40.0000 mg | ORAL_TABLET | Freq: Every day | ORAL | 2 refills | Status: DC
  Filled 2023-01-18: qty 30, 30d supply, fill #0

## 2023-01-18 MED ORDER — SPIRONOLACTONE 12.5 MG HALF TABLET
12.5000 mg | ORAL_TABLET | Freq: Every day | ORAL | Status: DC
  Administered 2023-01-18: 12.5 mg via ORAL
  Filled 2023-01-18: qty 1

## 2023-01-18 MED ORDER — FUROSEMIDE 40 MG PO TABS
40.0000 mg | ORAL_TABLET | Freq: Two times a day (BID) | ORAL | 0 refills | Status: DC
  Filled 2023-01-18 – 2023-02-21 (×6): qty 60, 30d supply, fill #0

## 2023-01-18 MED ORDER — METOPROLOL TARTRATE 25 MG PO TABS: 25.0000 mg | ORAL_TABLET | Freq: Two times a day (BID) | ORAL | 11 refills | Status: DC

## 2023-01-18 MED ORDER — BISACODYL 5 MG PO TBEC
5.0000 mg | DELAYED_RELEASE_TABLET | Freq: Once | ORAL | Status: AC
  Administered 2023-01-18: 5 mg via ORAL
  Filled 2023-01-18: qty 1

## 2023-01-18 MED ORDER — ATORVASTATIN CALCIUM 40 MG PO TABS
40.0000 mg | ORAL_TABLET | Freq: Every day | ORAL | 0 refills | Status: DC
  Filled 2023-01-18: qty 30, 30d supply, fill #0

## 2023-01-18 MED ORDER — EMPAGLIFLOZIN 10 MG PO TABS
10.0000 mg | ORAL_TABLET | Freq: Every day | ORAL | 0 refills | Status: DC
  Filled 2023-01-18 – 2023-02-21 (×6): qty 30, 30d supply, fill #0

## 2023-01-18 MED ORDER — CLOPIDOGREL BISULFATE 75 MG PO TABS: 75.0000 mg | ORAL_TABLET | Freq: Every day | ORAL | 3 refills | Status: DC

## 2023-01-18 MED ORDER — POLYETHYLENE GLYCOL 3350 17 GM/SCOOP PO POWD
17.0000 g | Freq: Every day | ORAL | 0 refills | Status: DC
  Filled 2023-01-18: qty 238, 14d supply, fill #0

## 2023-01-18 MED ORDER — ADULT MULTIVITAMIN W/MINERALS CH
1.0000 | ORAL_TABLET | Freq: Every day | ORAL | 0 refills | Status: AC
  Filled 2023-01-18: qty 30, 30d supply, fill #0

## 2023-01-18 NOTE — Hospital Course (Addendum)
Mr. Rushin was admitted to the hospital with the working diagnosis of heart failure exacerbation.   82 yo male with the past medical history of hypertension, dyslipidemia, coronary artery disease, CKD and heart failure who presented with dyspnea.  Recent hospitalization for heart failure 10/16 to 01/06/23 for heart failure. Placed on SGLT 2 inh and instructed to continue furosemide 40 mg daily and increase to bid in case of volume overload. Apparently at home he was having difficulty recording his weight accurately.  Follow up 10/25 patient with weight gain 5 lbs, with worsening edema, and dyspnea. Referred to the hospital for treatment. In the ED his blood pressure was 130/63, HR 60 to 55, RR 25 and 02 saturation 90%, lungs with no wheezing or rales, heart with S1 and S2 present and regular, abdomen with no distention, positive lower extremity edema.   Na 136, K 4,0 Cl 99, bicarbonate 28, glucose 102 bun 26 cr 1,44  Mg 2,3 BNP 134.3  Wbc 9,0 hgb 10.5 plt 230   Chest radiograph with hypoinflation, with mild hilar vascular congestion, no infiltrates or effusions.   EKG 57 bpm, right axis deviation, normal intervals, sinus rhythm with no significant ST segment changes, negative T wave lead I and AvL.   Patient was placed on furosemide for diuresis.  Volume status has improved.  Plan for follow up as outpatient.

## 2023-01-18 NOTE — Progress Notes (Signed)
Mobility Specialist Progress Note:   01/18/23 1522  Mobility  Activity Ambulated with assistance in hallway  Level of Assistance Contact guard assist, steadying assist  Assistive Device Front wheel walker  Distance Ambulated (ft) 250 ft  Activity Response Tolerated well  Mobility Referral Yes  $Mobility charge 1 Mobility  Mobility Specialist Start Time (ACUTE ONLY) 1410  Mobility Specialist Stop Time (ACUTE ONLY) 1425  Mobility Specialist Time Calculation (min) (ACUTE ONLY) 15 min   Received pt on BSC having no complaints and agreeable to mobility. Pt was asymptomatic throughout ambulation. Ambulated on RA, VSS, (see below). Returned to room w/o fault. Left in chair w/ call bell in reach and all needs met.  Pre Mobility RA SPO2 95% During Mobility  RA SPO2 99% Post Mobility  RA SPO2 100%  Thompson Grayer Mobility Specialist  Please contact vis Secure Chat or  Rehab Office [phone]

## 2023-01-18 NOTE — Assessment & Plan Note (Signed)
CKD stage 3a (new baseline serum cr 1,4).   Patient with improvement in volume status, at the time of his discharge his renal function has a serum cr of 1.47 with K at 3,8 and serum bicarbonate at 28.  Plan to continue diuresis and follow up renal function as outpatient.

## 2023-01-18 NOTE — Discharge Summary (Addendum)
12.5 mg total) by mouth daily. Start taking on: January 19, 2023   terbinafine 250 MG tablet Commonly known as: LAMISIL Take 1 tablet (250 mg total) by mouth daily with food.   triamcinolone cream 0.1 % Commonly known as: KENALOG Apply topically to the affected area 2 (two) times daily for 2 to 3 weeks   VITAMIN B-12 PO Take 2,500 mcg by mouth daily.        Follow-up Information     [facility]. Go in 8 day(s).   Specialty: Cardiology Why: Hospital follow up 01/24/2023 @ 3 pm PLEASE bring a current medicationlist to appointment FREE valet parking, Entrance C, off [employer] information: [address] [place] [address] [phone]        [facility] Follow up.   Specialty: Home Health Services Why: Agency will call you to set up apt times Contact information: [address] [place] [address] [phone]         [name], MD. Go on 01/26/2023.    Specialty: Internal Medicine Why: @11 :00am Contact information: [address] [place] [address] [phone]                Discharge Exam: Filed Weights   01/16/23 0443 01/17/23 0445 01/18/23 0433  Weight: 90.5 kg 86.3 kg 89.8 kg   BP (!) 103/58   Pulse 61   Temp 98.1 F (36.7 C) (Oral)   Resp 18   Ht 6' (1.829 m)   Wt 89.8 kg   SpO2 94%   BMI 26.85 kg/m   Patient is feeling better, no chest pain or dyspnea, no PND or orthopnea.   Neurology awake and alert ENT with mild pallor  Cardiovascular with S1 and S2 present and regular with no gallops, rubs or murmurs Respiratory with no rales or wheezing, no rhonchi Abdomen with no distention   Condition at discharge: stable  The results of significant diagnostics from this hospitalization (including imaging, microbiology, ancillary and laboratory) are listed below for reference.   Imaging Studies: DG Chest 2 View  Result Date: 01/12/2023 CLINICAL DATA:  82 year old male shortness of breath "feels like fluid back up", lower extremity swelling. EXAM: CHEST - 2 VIEW COMPARISON:  CTA chest 01/04/2023 and earlier. FINDINGS: Upright AP and lateral views of the chest 1126 hours. Continued low lung volumes. Mild bibasilar atelectasis. Heart size remains within normal limits. No pneumothorax, pulmonary edema, pleural effusion, consolidation. Visualized tracheal air column is within normal limits. No acute osseous abnormality identified. Negative visible bowel gas. IMPRESSION: Low lung volumes, otherwise no acute cardiopulmonary abnormality. Electronically Signed   By: Odessa Fleming M.D.   On: 01/12/2023 11:33   ECHOCARDIOGRAM COMPLETE  Result Date: 01/04/2023    ECHOCARDIOGRAM REPORT   Patient Name:   TREYVOR MARSALA Date of Exam: 01/04/2023 Medical Rec #:  [id number]       Height:       72.0 in Accession #:    [id number]      Weight:       207.5 lb Date of Birth:  May 09, 1940       BSA:          2.164 m Patient Age:    82 years         BP:           114/67 mmHg Patient Gender: M  Physician Discharge Summary   Patient: RANDEEP KRAFFT MRN: [id number] DOB: 02-Feb-1941  Admit date:     01/12/2023  Discharge date: 01/18/23  Discharge Physician: York Ram Tejay Hubert   PCP: [name], MD   Recommendations at discharge:    Furosemide has been increased to 40 mg po bid, and added spironolactone. Continue SGLT 2 inh and continue monitoring weight for signs of volume overload.  Follow up renal function and electrolytes in 7 days as outpatient.  Follow up with Dr Shary Decamp in 7 to 10 days Follow up with Cardiology as scheduled.  His oxygenation has improved, no longer needing supplemental 02 while ambulating. Instructed to use home 02 only at night for sleep. Follow up as outpatient with sleep study.   I spoke with patient's daughter at the bedside, we talked in detail about patient's condition, plan of care and prognosis and all questions were addressed.   Discharge Diagnoses: Principal Problem:   Acute on chronic diastolic (congestive) heart failure (HCC) Active Problems:   Acute kidney injury superimposed on chronic kidney disease (HCC)   CAD S/P percutaneous coronary angioplasty   Essential hypertension   Hyperlipidemia   Malnutrition of moderate degree  Resolved Problems:   * No resolved hospital problems. [facility] Course: Mr. Wilks was admitted to the hospital with the working diagnosis of heart failure exacerbation.   82 yo male with the past medical history of hypertension, dyslipidemia, coronary artery disease, CKD and heart failure who presented with dyspnea.  Recent hospitalization for heart failure 10/16 to 01/06/23 for heart failure. Placed on SGLT 2 inh and instructed to continue furosemide 40 mg daily and increase to bid in case of volume overload. Apparently at home he was having difficulty recording his weight accurately.  Follow up 10/25 patient with weight gain 5 lbs, with worsening edema, and dyspnea. Referred to the hospital for  treatment. In the ED his blood pressure was 130/63, HR 60 to 55, RR 25 and 02 saturation 90%, lungs with no wheezing or rales, heart with S1 and S2 present and regular, abdomen with no distention, positive lower extremity edema.   Na 136, K 4,0 Cl 99, bicarbonate 28, glucose 102 bun 26 cr 1,44  Mg 2,3 BNP 134.3  Wbc 9,0 hgb 10.5 plt 230   Chest radiograph with hypoinflation, with mild hilar vascular congestion, no infiltrates or effusions.   EKG 57 bpm, right axis deviation, normal intervals, sinus rhythm with no significant ST segment changes, negative T wave lead I and AvL.   Patient was placed on furosemide for diuresis.  Volume status has improved.  Plan for follow up as outpatient.   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 RV systolic function is preserved, RVSP 27.2 mmHg, LA with mild dilatation.   Patient was placed on IV furosemide for diuresis, negative fluid balance was achieved, -10,164 ml, with significant improvement in his symptoms.   Plan to continue diuresis with furosemide 40 mg po bid, spironolactone and SGLT 2 inh.  Continue metoprolol.  Add ARB as outpatient if renal function more stable.  Continue recording weight as outpatient.  Follow up as outpatient.   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   Patient with improvement in volume status, at the time  Physician Discharge Summary   Patient: RANDEEP KRAFFT MRN: [id number] DOB: 02-Feb-1941  Admit date:     01/12/2023  Discharge date: 01/18/23  Discharge Physician: York Ram Tejay Hubert   PCP: [name], MD   Recommendations at discharge:    Furosemide has been increased to 40 mg po bid, and added spironolactone. Continue SGLT 2 inh and continue monitoring weight for signs of volume overload.  Follow up renal function and electrolytes in 7 days as outpatient.  Follow up with Dr Shary Decamp in 7 to 10 days Follow up with Cardiology as scheduled.  His oxygenation has improved, no longer needing supplemental 02 while ambulating. Instructed to use home 02 only at night for sleep. Follow up as outpatient with sleep study.   I spoke with patient's daughter at the bedside, we talked in detail about patient's condition, plan of care and prognosis and all questions were addressed.   Discharge Diagnoses: Principal Problem:   Acute on chronic diastolic (congestive) heart failure (HCC) Active Problems:   Acute kidney injury superimposed on chronic kidney disease (HCC)   CAD S/P percutaneous coronary angioplasty   Essential hypertension   Hyperlipidemia   Malnutrition of moderate degree  Resolved Problems:   * No resolved hospital problems. [facility] Course: Mr. Wilks was admitted to the hospital with the working diagnosis of heart failure exacerbation.   82 yo male with the past medical history of hypertension, dyslipidemia, coronary artery disease, CKD and heart failure who presented with dyspnea.  Recent hospitalization for heart failure 10/16 to 01/06/23 for heart failure. Placed on SGLT 2 inh and instructed to continue furosemide 40 mg daily and increase to bid in case of volume overload. Apparently at home he was having difficulty recording his weight accurately.  Follow up 10/25 patient with weight gain 5 lbs, with worsening edema, and dyspnea. Referred to the hospital for  treatment. In the ED his blood pressure was 130/63, HR 60 to 55, RR 25 and 02 saturation 90%, lungs with no wheezing or rales, heart with S1 and S2 present and regular, abdomen with no distention, positive lower extremity edema.   Na 136, K 4,0 Cl 99, bicarbonate 28, glucose 102 bun 26 cr 1,44  Mg 2,3 BNP 134.3  Wbc 9,0 hgb 10.5 plt 230   Chest radiograph with hypoinflation, with mild hilar vascular congestion, no infiltrates or effusions.   EKG 57 bpm, right axis deviation, normal intervals, sinus rhythm with no significant ST segment changes, negative T wave lead I and AvL.   Patient was placed on furosemide for diuresis.  Volume status has improved.  Plan for follow up as outpatient.   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 RV systolic function is preserved, RVSP 27.2 mmHg, LA with mild dilatation.   Patient was placed on IV furosemide for diuresis, negative fluid balance was achieved, -10,164 ml, with significant improvement in his symptoms.   Plan to continue diuresis with furosemide 40 mg po bid, spironolactone and SGLT 2 inh.  Continue metoprolol.  Add ARB as outpatient if renal function more stable.  Continue recording weight as outpatient.  Follow up as outpatient.   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   Patient with improvement in volume status, at the time  Physician Discharge Summary   Patient: RANDEEP KRAFFT MRN: [id number] DOB: 02-Feb-1941  Admit date:     01/12/2023  Discharge date: 01/18/23  Discharge Physician: York Ram Tejay Hubert   PCP: [name], MD   Recommendations at discharge:    Furosemide has been increased to 40 mg po bid, and added spironolactone. Continue SGLT 2 inh and continue monitoring weight for signs of volume overload.  Follow up renal function and electrolytes in 7 days as outpatient.  Follow up with Dr Shary Decamp in 7 to 10 days Follow up with Cardiology as scheduled.  His oxygenation has improved, no longer needing supplemental 02 while ambulating. Instructed to use home 02 only at night for sleep. Follow up as outpatient with sleep study.   I spoke with patient's daughter at the bedside, we talked in detail about patient's condition, plan of care and prognosis and all questions were addressed.   Discharge Diagnoses: Principal Problem:   Acute on chronic diastolic (congestive) heart failure (HCC) Active Problems:   Acute kidney injury superimposed on chronic kidney disease (HCC)   CAD S/P percutaneous coronary angioplasty   Essential hypertension   Hyperlipidemia   Malnutrition of moderate degree  Resolved Problems:   * No resolved hospital problems. [facility] Course: Mr. Wilks was admitted to the hospital with the working diagnosis of heart failure exacerbation.   82 yo male with the past medical history of hypertension, dyslipidemia, coronary artery disease, CKD and heart failure who presented with dyspnea.  Recent hospitalization for heart failure 10/16 to 01/06/23 for heart failure. Placed on SGLT 2 inh and instructed to continue furosemide 40 mg daily and increase to bid in case of volume overload. Apparently at home he was having difficulty recording his weight accurately.  Follow up 10/25 patient with weight gain 5 lbs, with worsening edema, and dyspnea. Referred to the hospital for  treatment. In the ED his blood pressure was 130/63, HR 60 to 55, RR 25 and 02 saturation 90%, lungs with no wheezing or rales, heart with S1 and S2 present and regular, abdomen with no distention, positive lower extremity edema.   Na 136, K 4,0 Cl 99, bicarbonate 28, glucose 102 bun 26 cr 1,44  Mg 2,3 BNP 134.3  Wbc 9,0 hgb 10.5 plt 230   Chest radiograph with hypoinflation, with mild hilar vascular congestion, no infiltrates or effusions.   EKG 57 bpm, right axis deviation, normal intervals, sinus rhythm with no significant ST segment changes, negative T wave lead I and AvL.   Patient was placed on furosemide for diuresis.  Volume status has improved.  Plan for follow up as outpatient.   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 RV systolic function is preserved, RVSP 27.2 mmHg, LA with mild dilatation.   Patient was placed on IV furosemide for diuresis, negative fluid balance was achieved, -10,164 ml, with significant improvement in his symptoms.   Plan to continue diuresis with furosemide 40 mg po bid, spironolactone and SGLT 2 inh.  Continue metoprolol.  Add ARB as outpatient if renal function more stable.  Continue recording weight as outpatient.  Follow up as outpatient.   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   Patient with improvement in volume status, at the time  12.5 mg total) by mouth daily. Start taking on: January 19, 2023   terbinafine 250 MG tablet Commonly known as: LAMISIL Take 1 tablet (250 mg total) by mouth daily with food.   triamcinolone cream 0.1 % Commonly known as: KENALOG Apply topically to the affected area 2 (two) times daily for 2 to 3 weeks   VITAMIN B-12 PO Take 2,500 mcg by mouth daily.        Follow-up Information     [facility]. Go in 8 day(s).   Specialty: Cardiology Why: Hospital follow up 01/24/2023 @ 3 pm PLEASE bring a current medicationlist to appointment FREE valet parking, Entrance C, off [employer] information: [address] [place] [address] [phone]        [facility] Follow up.   Specialty: Home Health Services Why: Agency will call you to set up apt times Contact information: [address] [place] [address] [phone]         [name], MD. Go on 01/26/2023.    Specialty: Internal Medicine Why: @11 :00am Contact information: [address] [place] [address] [phone]                Discharge Exam: Filed Weights   01/16/23 0443 01/17/23 0445 01/18/23 0433  Weight: 90.5 kg 86.3 kg 89.8 kg   BP (!) 103/58   Pulse 61   Temp 98.1 F (36.7 C) (Oral)   Resp 18   Ht 6' (1.829 m)   Wt 89.8 kg   SpO2 94%   BMI 26.85 kg/m   Patient is feeling better, no chest pain or dyspnea, no PND or orthopnea.   Neurology awake and alert ENT with mild pallor  Cardiovascular with S1 and S2 present and regular with no gallops, rubs or murmurs Respiratory with no rales or wheezing, no rhonchi Abdomen with no distention   Condition at discharge: stable  The results of significant diagnostics from this hospitalization (including imaging, microbiology, ancillary and laboratory) are listed below for reference.   Imaging Studies: DG Chest 2 View  Result Date: 01/12/2023 CLINICAL DATA:  82 year old male shortness of breath "feels like fluid back up", lower extremity swelling. EXAM: CHEST - 2 VIEW COMPARISON:  CTA chest 01/04/2023 and earlier. FINDINGS: Upright AP and lateral views of the chest 1126 hours. Continued low lung volumes. Mild bibasilar atelectasis. Heart size remains within normal limits. No pneumothorax, pulmonary edema, pleural effusion, consolidation. Visualized tracheal air column is within normal limits. No acute osseous abnormality identified. Negative visible bowel gas. IMPRESSION: Low lung volumes, otherwise no acute cardiopulmonary abnormality. Electronically Signed   By: Odessa Fleming M.D.   On: 01/12/2023 11:33   ECHOCARDIOGRAM COMPLETE  Result Date: 01/04/2023    ECHOCARDIOGRAM REPORT   Patient Name:   TREYVOR MARSALA Date of Exam: 01/04/2023 Medical Rec #:  [id number]       Height:       72.0 in Accession #:    [id number]      Weight:       207.5 lb Date of Birth:  May 09, 1940       BSA:          2.164 m Patient Age:    82 years         BP:           114/67 mmHg Patient Gender: M  12.5 mg total) by mouth daily. Start taking on: January 19, 2023   terbinafine 250 MG tablet Commonly known as: LAMISIL Take 1 tablet (250 mg total) by mouth daily with food.   triamcinolone cream 0.1 % Commonly known as: KENALOG Apply topically to the affected area 2 (two) times daily for 2 to 3 weeks   VITAMIN B-12 PO Take 2,500 mcg by mouth daily.        Follow-up Information     [facility]. Go in 8 day(s).   Specialty: Cardiology Why: Hospital follow up 01/24/2023 @ 3 pm PLEASE bring a current medicationlist to appointment FREE valet parking, Entrance C, off [employer] information: [address] [place] [address] [phone]        [facility] Follow up.   Specialty: Home Health Services Why: Agency will call you to set up apt times Contact information: [address] [place] [address] [phone]         [name], MD. Go on 01/26/2023.    Specialty: Internal Medicine Why: @11 :00am Contact information: [address] [place] [address] [phone]                Discharge Exam: Filed Weights   01/16/23 0443 01/17/23 0445 01/18/23 0433  Weight: 90.5 kg 86.3 kg 89.8 kg   BP (!) 103/58   Pulse 61   Temp 98.1 F (36.7 C) (Oral)   Resp 18   Ht 6' (1.829 m)   Wt 89.8 kg   SpO2 94%   BMI 26.85 kg/m   Patient is feeling better, no chest pain or dyspnea, no PND or orthopnea.   Neurology awake and alert ENT with mild pallor  Cardiovascular with S1 and S2 present and regular with no gallops, rubs or murmurs Respiratory with no rales or wheezing, no rhonchi Abdomen with no distention   Condition at discharge: stable  The results of significant diagnostics from this hospitalization (including imaging, microbiology, ancillary and laboratory) are listed below for reference.   Imaging Studies: DG Chest 2 View  Result Date: 01/12/2023 CLINICAL DATA:  82 year old male shortness of breath "feels like fluid back up", lower extremity swelling. EXAM: CHEST - 2 VIEW COMPARISON:  CTA chest 01/04/2023 and earlier. FINDINGS: Upright AP and lateral views of the chest 1126 hours. Continued low lung volumes. Mild bibasilar atelectasis. Heart size remains within normal limits. No pneumothorax, pulmonary edema, pleural effusion, consolidation. Visualized tracheal air column is within normal limits. No acute osseous abnormality identified. Negative visible bowel gas. IMPRESSION: Low lung volumes, otherwise no acute cardiopulmonary abnormality. Electronically Signed   By: Odessa Fleming M.D.   On: 01/12/2023 11:33   ECHOCARDIOGRAM COMPLETE  Result Date: 01/04/2023    ECHOCARDIOGRAM REPORT   Patient Name:   TREYVOR MARSALA Date of Exam: 01/04/2023 Medical Rec #:  [id number]       Height:       72.0 in Accession #:    [id number]      Weight:       207.5 lb Date of Birth:  May 09, 1940       BSA:          2.164 m Patient Age:    82 years         BP:           114/67 mmHg Patient Gender: M  Physician Discharge Summary   Patient: RANDEEP KRAFFT MRN: [id number] DOB: 02-Feb-1941  Admit date:     01/12/2023  Discharge date: 01/18/23  Discharge Physician: York Ram Tejay Hubert   PCP: [name], MD   Recommendations at discharge:    Furosemide has been increased to 40 mg po bid, and added spironolactone. Continue SGLT 2 inh and continue monitoring weight for signs of volume overload.  Follow up renal function and electrolytes in 7 days as outpatient.  Follow up with Dr Shary Decamp in 7 to 10 days Follow up with Cardiology as scheduled.  His oxygenation has improved, no longer needing supplemental 02 while ambulating. Instructed to use home 02 only at night for sleep. Follow up as outpatient with sleep study.   I spoke with patient's daughter at the bedside, we talked in detail about patient's condition, plan of care and prognosis and all questions were addressed.   Discharge Diagnoses: Principal Problem:   Acute on chronic diastolic (congestive) heart failure (HCC) Active Problems:   Acute kidney injury superimposed on chronic kidney disease (HCC)   CAD S/P percutaneous coronary angioplasty   Essential hypertension   Hyperlipidemia   Malnutrition of moderate degree  Resolved Problems:   * No resolved hospital problems. [facility] Course: Mr. Wilks was admitted to the hospital with the working diagnosis of heart failure exacerbation.   82 yo male with the past medical history of hypertension, dyslipidemia, coronary artery disease, CKD and heart failure who presented with dyspnea.  Recent hospitalization for heart failure 10/16 to 01/06/23 for heart failure. Placed on SGLT 2 inh and instructed to continue furosemide 40 mg daily and increase to bid in case of volume overload. Apparently at home he was having difficulty recording his weight accurately.  Follow up 10/25 patient with weight gain 5 lbs, with worsening edema, and dyspnea. Referred to the hospital for  treatment. In the ED his blood pressure was 130/63, HR 60 to 55, RR 25 and 02 saturation 90%, lungs with no wheezing or rales, heart with S1 and S2 present and regular, abdomen with no distention, positive lower extremity edema.   Na 136, K 4,0 Cl 99, bicarbonate 28, glucose 102 bun 26 cr 1,44  Mg 2,3 BNP 134.3  Wbc 9,0 hgb 10.5 plt 230   Chest radiograph with hypoinflation, with mild hilar vascular congestion, no infiltrates or effusions.   EKG 57 bpm, right axis deviation, normal intervals, sinus rhythm with no significant ST segment changes, negative T wave lead I and AvL.   Patient was placed on furosemide for diuresis.  Volume status has improved.  Plan for follow up as outpatient.   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 RV systolic function is preserved, RVSP 27.2 mmHg, LA with mild dilatation.   Patient was placed on IV furosemide for diuresis, negative fluid balance was achieved, -10,164 ml, with significant improvement in his symptoms.   Plan to continue diuresis with furosemide 40 mg po bid, spironolactone and SGLT 2 inh.  Continue metoprolol.  Add ARB as outpatient if renal function more stable.  Continue recording weight as outpatient.  Follow up as outpatient.   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   Patient with improvement in volume status, at the time

## 2023-01-18 NOTE — Assessment & Plan Note (Signed)
Patient was placed on nutritional supplements.  Follow up as outpatient.

## 2023-01-18 NOTE — TOC Transition Note (Signed)
Transition of Care [facility]) - CM/SW Discharge Note   Patient Details  Name: Jeffrey Acevedo MRN: [id number] Date of Birth: 01-05-1941  Transition of [facility]) CM/SW Contact:  Leone Haven, RN Phone Number: 01/18/2023, 1:39 PM   Clinical Narrative:    For dc today, NCM notified Kelly with [facility]. He has transport home.     Final next level of care: Home w Home Health Services Barriers to Discharge: Continued Medical Work up   Patient Goals and CMS Choice CMS Medicare.gov Compare Post Acute Care list provided to:: Patient Choice offered to / list presented to : Patient  Discharge Placement                         Discharge Plan and Services Additional resources added to the After Visit Summary for   In-house Referral: NA Discharge Planning Services: CM Consult Post Acute Care Choice: Home Health          DME Arranged: N/A         HH Arranged: RN, PT, Disease Management HH Agency: [facility] Date [facility] Agency Contacted: 01/16/23 Time HH Agency Contacted: 1252 Representative spoke with at [employer] Agency: Tresa Endo  Social Determinants of Health (SDOH) Interventions SDOH Screenings   Food Insecurity: No Food Insecurity (01/12/2023)  Housing: Low Risk  (01/12/2023)  Transportation Needs: No Transportation Needs (01/12/2023)  Recent Concern: Transportation Needs - Unmet Transportation Needs (12/12/2022)   Received from [facility]  Utilities: Not At Risk (01/12/2023)  Alcohol Screen: Low Risk  (01/15/2023)  Financial Resource Strain: Low Risk  (01/15/2023)  Tobacco Use: Low Risk  (01/12/2023)     Readmission Risk Interventions     No data to display

## 2023-01-18 NOTE — Progress Notes (Signed)
Mobility Specialist Progress Note:  Nurse requested Mobility Specialist to perform oxygen saturation test with pt which includes removing pt from oxygen both at rest and while ambulating.  Below are the results from that testing.     Patient Saturations on Room Air at Rest = spO2 95%  Patient Saturations on Room Air while Ambulating = sp02 99% .    At end of testing pt left in room on 0  Liters of oxygen.  Reported results to nurse.    Thompson Grayer Mobility Specialist  Please contact vis Secure Chat or  Rehab Office [phone]

## 2023-01-18 NOTE — Progress Notes (Signed)
   Heart Failure Stewardship Pharmacist Progress Note   PCP: [name], MD PCP-Cardiologist: [name], MD    HPI:  82 yo M with PMH of CHF, CAD, HTN, HLD, and CKD IIIa.   Recently admitted 10/11-10/12 for unstable angina. Underwent PCI with LAD stenting. PA 23, wedge 7, and LVEDP 18.   Readmitted 10/16-10/19 with acute on chronic CHF. ECHO with EF 65-70%, positive LVOT with high gradient. 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 Diuresed with IV lasix and started on Jardiance at discharge.   Was seen in follow up on 10/25 and was still reporting shortness of breath, LE edema, and weight gain. Unable to diurese as outpatient with worsening AKI. He was referred to the ED for readmission. CXR showed no pulmonary edema. BNP 134.   Current HF Medications: Diuretic: furosemide 40 mg PO BID Beta Blocker: metoprolol XL 25 mg daily MRA: spironolactone 12.5 mg daily SGLT2i: Jardiance 10 mg daily  Prior to admission HF Medications: Diuretic: furosemide 40 mg daily (BID PRN edema) Beta blocker: metoprolol tartrate 25 mg BID SGLT2i: Jardiance 10 mg daily Other: Imdur 30 mg daily  Pertinent Lab Values: Serum creatinine 1.47, BUN 34, Potassium 3.8, Sodium 133, BNP 134.3, Magnesium 2.3, A1c 5.5   Vital Signs: Weight: 197 lbs (admission weight: 206 lbs) Blood pressure: 100/50s  Heart rate: 50-60s  I/O: net -2.2L yesterday; net -10.2L since admission  Medication Assistance / Insurance Benefits Check: Does the patient have prescription insurance?  Yes Type of insurance plan: [employer]  Outpatient Pharmacy:  Prior to admission outpatient pharmacy: [facility] Is the patient willing to use [facility] TOC pharmacy at discharge? Yes Is the patient willing to transition their outpatient pharmacy to utilize a [facility] outpatient pharmacy?   Yes - would like to transition to [facility] mail order    Assessment: 1. Acute on chronic diastolic CHF (LVEF 65-70%), due to HOCM vs  hyperdynamic LV with intracavitary obstruction. Positive LVOT with high gradient. NYHA class II symptoms. - Agree with transitioning to furosemide 40 mg PO BID. Now off supplemental O2 (was on 3L at home). Strict I/Os and daily weights. Keep K>4 and Mg>2. - Continue metoprolol XL 25 mg daily - Agree with restarting spironolactone 12.5 mg daily - Continue Jardiance 10 mg daily - Holding PTA Imdur - would not resume at discharge  Plan: 1) Medication changes recommended at this time: - Agree with changes  2) Patient assistance: - Spoke with patient's daughter, Selena Batten, and she is agreeable to using [facility] mail order after discharge. The patient and his wife are unable to drive now and it can be challenging to coordinate pickup of medications.   3)  Education  - Patient has been educated on current HF medications and potential additions to HF medication regimen - Patient verbalizes understanding that over the next few months, these medication doses may change and more medications may be added to optimize HF regimen - Patient has been educated on basic disease state pathophysiology and goals of therapy   Sharen Hones, PharmD, BCPS Heart Failure Stewardship Pharmacist Phone [phone]

## 2023-01-18 NOTE — Assessment & Plan Note (Signed)
Anemia of chronic disease, with iron deficiency.  Serum iron 34, TIBC 374, transferrin saturation 9 and ferritin 1002.  Plan to add oral iron supplementation and follow up iron stores in 4 weeks, if no improvement may need IV iron.  Discharge hgb is 9,4 with wbc 6.4 and plt 198.

## 2023-01-18 NOTE — Progress Notes (Signed)
Discharge instructions reviewed with pt and his daughter, daughter primary caregiver for her father and mother. Reviewed all meds and follow up appointments, discussion was lengthy as daughter had questions about medications, etc.  [facility] has filled scripts for pt and will be picked up on the way out for discharge. Copy of instructions given to daughter.   Pt on the [facility], having issues with having a BM.  Pt's primary nurse has given pt some medication to assist with BM. Pt and daughter informed medication may not work until later today. Pt also going home on miralax daily to aide in regular BMs. Also discussed OTC meds pt could use for constipation.  Pt's primary nurse made aware of pt still trying to have a BM, reported that pt has had BMs while he has been here and pt reports he had one yesterday.   Once pt is done on the [facility], pt will need his IV site d/c'd and get dressed to go. Pt will need to go by [facility] to pick up meds on the way out.  (Pt has other meds at [facility] community pharmacy that he has delivered to his home per daughter).   Maury Bamba,RN SWOT

## 2023-01-18 NOTE — Progress Notes (Signed)
   Patient Name: Jeffrey Acevedo Date of Encounter: 01/18/2023 [facility] Cardiologist: [name], MD   Interval Summary  .    Sitting up in chair feeling comfortable.  No shortness of breath. Kim in room.   Vital Signs .    Vitals:   01/18/23 0108 01/18/23 0433 01/18/23 0735 01/18/23 0832  BP: (!) 101/52 (!) 99/55 (!) 103/58 (!) 103/58  Pulse: (!) 58 (!) 56 92 61  Resp: 20 20 18    Temp: 98.2 F (36.8 C) 98.2 F (36.8 C) 98.1 F (36.7 C)   TempSrc: Oral Oral Oral   SpO2: 93% 91% 94%   Weight:  89.8 kg    Height:        Intake/Output Summary (Last 24 hours) at 01/18/2023 1200 Last data filed at 01/18/2023 0854 Gross per 24 hour  Intake 443 ml  Output 1450 ml  Net -1007 ml      01/18/2023    4:33 AM 01/17/2023    4:45 AM 01/16/2023    4:43 AM  Last 3 Weights  Weight (lbs) 197 lb 15.6 oz 190 lb 3.2 oz 199 lb 8.3 oz  Weight (kg) 89.8 kg 86.274 kg 90.5 kg      Telemetry/ECG    No adverse arrhythmias- Personally Reviewed  Physical Exam .   GEN: No acute distress.,  Off nasal cannula oxygen Neck: No JVD Cardiac: RRR, no murmurs, rubs, or gallops.  Respiratory: Clear to auscultation bilaterally. GI: Soft, nontender, non-distended  MS: No edema, TED hose in place  Assessment & Plan .     Acute on chronic diastolic heart failure with EF of 70% with hyperdynamic LV intracavitary obstruction - Successfully diuresed with furosemide 40 mg IV twice daily. - We will go ahead and change him back to oral Lasix 40 mg p.o. twice daily to take at home.  Basic metabolic profile in 1 to 2 weeks. - Resume spironolactone 12.5 mg a day -Continue with Jardiance 10 mg a day -No longer on amlodipine  CAD-PCI to LAD - Continue with aspirin and Plavix.  Reminder-recent readmission for heart failure exacerbation.  Close follow-up with Dr. Felicity Coyer team.  OK for discharge.  Discussed with primary team.  Discharge weight 197 pounds Discharge creatinine  1.47-likely new baseline.  For questions or updates, please contact [facility] Please consult [email or web address] for contact info under        Signed, Donato Schultz, MD

## 2023-01-18 NOTE — Progress Notes (Signed)
Physical Therapy Treatment Patient Details Name: Jeffrey Acevedo MRN: [id number] DOB: 08/25/1940 Today's Date: 01/18/2023   History of Present Illness 82 yo male presents to [facility] on 10/25 with fluid overload and SOB, workup for HF exacerbation. Recent admission 10/16-10/19 for AKI, HF exacerbation, respiratory failure. PMH includes HTN, HLD, CAD s/p stents, CKDII, dCHF.    PT Comments  Pt greeted resting in bed and agreeable to session with continued progress towards acute goals. Pt on 1L on arrival, O2 doffed for gait trial with pt maintaining SpO2 >90% throughout activity when good pleth available as pt with strong grasp and UE use on RW affecting good reading. RN present and end fo session and O2 left off. Physically pt requiring supervision for bed mobility and grossly CGA for transfers and gait with RW for support. Educated pt on importance of daily weights, low sodium diet and appropriate activity progression with pt verbalizing understanding. Pt continues to benefit from skilled PT services to progress toward functional mobility goals.       If plan is discharge home, recommend the following: A little help with walking and/or transfers;A little help with bathing/dressing/bathroom   Can travel by private vehicle        Equipment Recommendations  None recommended by PT    Recommendations for Other Services       Precautions / Restrictions Precautions Precautions: Fall Precaution Comments: on 3LO2 chronically since stents, pt on 1L O2 in bed at begining of session Restrictions Weight Bearing Restrictions: No     Mobility  Bed Mobility Overal bed mobility: Needs Assistance Bed Mobility: Supine to Sit     Supine to sit: Supervision, HOB elevated     General bed mobility comments: supervision for safety    Transfers Overall transfer level: Needs assistance Equipment used: Rolling walker (2 wheels) Transfers: Sit to/from Stand Sit to Stand: Contact guard assist            General transfer comment: CGA for saferty, cues for hand placement, able to complete z10 at end of session    Ambulation/Gait Ambulation/Gait assistance: Contact guard assist Gait Distance (Feet): 260 Feet Assistive device: Rolling walker (2 wheels) Gait Pattern/deviations: Step-through pattern, Decreased stride length, Trunk flexed Gait velocity: decr     General Gait Details: CGA for safety and cueing to stay within BOS of RW, SpO2 >90% on RA during activity, poor pleth intermittently with recovery from 80s- >90% within a few seconds realxing hand from [employer]             Wheelchair Mobility     Tilt Bed    Modified Rankin (Stroke Patients Only)       Balance Overall balance assessment: Needs assistance, History of Falls Sitting-balance support: No upper extremity supported, Feet supported Sitting balance-Leahy Scale: Fair Sitting balance - Comments: Seated on EOB with no UE support 1 min forward trunk   Standing balance support: Bilateral upper extremity supported, During functional activity Standing balance-Leahy Scale: Poor Standing balance comment: reliant on external support                            Cognition Arousal: Alert Behavior During Therapy: WFL for tasks assessed/performed Overall Cognitive Status: Within Functional Limits for tasks assessed  Exercises Other Exercises Other Exercises: x10 sit<>stand    General Comments        Pertinent Vitals/Pain Pain Assessment Pain Assessment: Faces Faces Pain Scale: Hurts a little bit Pain Location: buttocks, sacral wound Pain Descriptors / Indicators: Discomfort Pain Intervention(s): Monitored during session, Limited activity within patient's tolerance    Home Living                          Prior Function            PT Goals (current goals can now be found in the care plan section) Acute Rehab PT  Goals Patient Stated Goal: home PT Goal Formulation: With patient Time For Goal Achievement: 01/29/23 Progress towards PT goals: Progressing toward goals    Frequency    Min 1X/week      PT Plan      Co-evaluation              AM-PAC PT "6 Clicks" Mobility   Outcome Measure  Help needed turning from your back to your side while in a flat bed without using bedrails?: A Little Help needed moving from lying on your back to sitting on the side of a flat bed without using bedrails?: A Little Help needed moving to and from a bed to a chair (including a wheelchair)?: A Little Help needed standing up from a chair using your arms (e.g., wheelchair or bedside chair)?: A Little Help needed to walk in hospital room?: A Little Help needed climbing 3-5 steps with a railing? : A Little 6 Click Score: 18    End of Session   Activity Tolerance: Patient tolerated treatment well Patient left: in chair;with call bell/phone within reach;with nursing/sitter in room Nurse Communication: Mobility status PT Visit Diagnosis: Other abnormalities of gait and mobility (R26.89);Muscle weakness (generalized) (M62.81)     Time: 3664-4034 PT Time Calculation (min) (ACUTE ONLY): 32 min  Charges:    $Gait Training: 8-22 mins $Therapeutic Activity: 8-22 mins PT General Charges $$ ACUTE PT VISIT: 1 Visit                     Myrtice Lowdermilk R. PTA Acute Rehabilitation Services Office: [phone]   Catalina Antigua 01/18/2023, 11:47 AM

## 2023-01-18 NOTE — Assessment & Plan Note (Signed)
Continue aspirin and clopidogrel. Continue statin.  No acute coronary syndrome.

## 2023-01-18 NOTE — Assessment & Plan Note (Signed)
Continue statin therapy.

## 2023-01-18 NOTE — Assessment & Plan Note (Signed)
Continue blood pressure control with metoprolol.  Holding isosorbide and amlodipine.

## 2023-01-18 NOTE — Assessment & Plan Note (Signed)
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 RV systolic function is preserved, RVSP 27.2 mmHg, LA with mild dilatation.   Patient was placed on IV furosemide for diuresis, negative fluid balance was achieved, -10,164 ml, with significant improvement in his symptoms.   Plan to continue diuresis with furosemide 40 mg po bid, spironolactone and SGLT 2 inh.  Continue metoprolol.  Add ARB as outpatient if renal function more stable.  Continue recording weight as outpatient.  Follow up as outpatient.

## 2023-01-19 ENCOUNTER — Other Ambulatory Visit: Payer: Self-pay

## 2023-01-23 ENCOUNTER — Other Ambulatory Visit (HOSPITAL_COMMUNITY): Payer: Self-pay

## 2023-01-23 ENCOUNTER — Encounter (HOSPITAL_BASED_OUTPATIENT_CLINIC_OR_DEPARTMENT_OTHER): Payer: Medicare PPO | Admitting: Internal Medicine

## 2023-01-24 ENCOUNTER — Ambulatory Visit (HOSPITAL_COMMUNITY)
Admit: 2023-01-24 | Discharge: 2023-01-24 | Disposition: A | Discharge status: Home / Self Care | Payer: Medicare PPO | Attending: Cardiology | Admitting: Cardiology

## 2023-01-24 ENCOUNTER — Encounter (HOSPITAL_COMMUNITY): Payer: Self-pay

## 2023-01-24 ENCOUNTER — Ambulatory Visit: Payer: Medicare PPO | Admitting: Physician Assistant

## 2023-01-24 DIAGNOSIS — I421 Obstructive hypertrophic cardiomyopathy: Secondary | ICD-10-CM | POA: Insufficient documentation

## 2023-01-24 DIAGNOSIS — Z955 Presence of coronary angioplasty implant and graft: Secondary | ICD-10-CM | POA: Diagnosis not present

## 2023-01-24 DIAGNOSIS — Z79899 Other long term (current) drug therapy: Secondary | ICD-10-CM | POA: Insufficient documentation

## 2023-01-24 DIAGNOSIS — I5032 Chronic diastolic (congestive) heart failure: Secondary | ICD-10-CM | POA: Insufficient documentation

## 2023-01-24 DIAGNOSIS — N1831 Chronic kidney disease, stage 3a: Secondary | ICD-10-CM | POA: Diagnosis not present

## 2023-01-24 DIAGNOSIS — Z7984 Long term (current) use of oral hypoglycemic drugs: Secondary | ICD-10-CM | POA: Diagnosis not present

## 2023-01-24 DIAGNOSIS — Z7902 Long term (current) use of antithrombotics/antiplatelets: Secondary | ICD-10-CM | POA: Insufficient documentation

## 2023-01-24 DIAGNOSIS — I130 Hypertensive heart and chronic kidney disease with heart failure and stage 1 through stage 4 chronic kidney disease, or unspecified chronic kidney disease: Secondary | ICD-10-CM | POA: Insufficient documentation

## 2023-01-24 DIAGNOSIS — I25110 Atherosclerotic heart disease of native coronary artery with unstable angina pectoris: Secondary | ICD-10-CM | POA: Diagnosis not present

## 2023-01-24 DIAGNOSIS — Z7982 Long term (current) use of aspirin: Secondary | ICD-10-CM | POA: Diagnosis not present

## 2023-01-24 LAB — BASIC METABOLIC PANEL
Anion gap: 9 (ref 5–15)
BUN: 27 mg/dL — ABNORMAL HIGH (ref 8–23)
CO2: 28 mmol/L (ref 22–32)
Calcium: 9.4 mg/dL (ref 8.9–10.3)
Chloride: 97 mmol/L — ABNORMAL LOW (ref 98–111)
Creatinine, Ser: 1.58 mg/dL — ABNORMAL HIGH (ref 0.61–1.24)
GFR, Estimated: 44 mL/min — ABNORMAL LOW (ref >60)
Glucose, Bld: 98 mg/dL (ref 70–99)
Potassium: 4.5 mmol/L (ref 3.5–5.1)
Sodium: 134 mmol/L — ABNORMAL LOW (ref 135–145)

## 2023-01-24 LAB — BRAIN NATRIURETIC PEPTIDE: B Natriuretic Peptide: 87.3 pg/mL (ref 0.0–100.0)

## 2023-01-24 NOTE — Patient Instructions (Signed)
Thank you for your visit today.  Labs done today, your results will be available in MyChart, we will contact you for abnormal readings.  Thank you for allowing [place] to provider your heart failure care after your recent hospitalization. Please follow-up with Dr. Allyson Sabal in the next 4 weeks.  If you have any questions, issues, or concerns before your next appointment please call our office at [phone], opt. 2 and leave a message for the triage nurse.

## 2023-01-24 NOTE — Progress Notes (Signed)
ReDS Vest / Clip - 01/24/23 1500       ReDS Vest / Clip   Station Marker C    Ruler Value 28    ReDS Value Range Low volume    ReDS Actual Value 35

## 2023-01-24 NOTE — Progress Notes (Signed)
HEART & VASCULAR TRANSITION OF CARE CONSULT NOTE     Referring Physician: Dr. Anne Fu Primary Care: [name], MD Primary Cardiologist: [name], MD  HPI: Referred to clinic by Dr. Anne Fu for heart failure consultation.    82 y/o male w/ h/o HFpEF, CAD s/p remote LAD stenting in 1998, CKD IIIa and hypertension. Admitted 10/11-10/12 for unstable angina. LHC showed patent previously placed prox-mid LAD stents and obstructive, 90%, mid LAD lesion between the 2 previously placed stents. This was treated w/ PCI + DES. Also w/ 60% mRI lesion, 40% mLCx and 30% pRCA treated medically. RHC demonstrated mRA 11, PAP 40/11 w/ mean 23, mPCW 7, LVEDP 22, FICK CO 6.12, FICK CI 2.87. Echo was not done this admit.   He was readmitted 01/12/24 for LEE and dyspnea and found to be in acute CHF. This was in the setting of dietary indiscretion w/ sodium. Echo showed hyperdynamic LV function. EF 65-70% w/ mod asymmetric LVH of the septal segment. GIDD, LV outflow tract gradient peaked at 54 mmHg, RV normal. Study was notable for an intracavitary gradient to 54 mmHg, appeared to be chordal systolic anterior motion, seemed to be intracavitary obstruction. There was no posteriorly directed mitral valve regurgitation to suspect mitral valve systolic anterior motion. 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  Given outflow tract obstruction, Imdur was discontinued. Started on metoprolol. He was diuresed w/ IV lasix and transitioned back to PO Lasix + spironolactone. [place] Pepper was continued. Referred to TOC at d/c.   He presents to clinic today for assessment. Here w/ his wife and daughter. In WC. Reports breathing improved, though not very active at baseline. Denies resting dyspnea. Able to do basic ADLs and ambulate around his home w/o dyspnea. Has been checking wt daily at home and continues to trend down since d/c.  Has made significant dietary changes. Eating less and low sodium. BP is soft 90/50, reported c/w his baseline. He denies any orthostatic symptoms. Wearing TED hoses. ReDs 35%. Denies CP.    Cardiac Testing   1. Left ventricular ejection fraction, by estimation, is 65 to 70%. The  left ventricle has hyperdynamic function. The left ventricle has no  regional wall motion abnormalities. There is moderate asymmetric left  ventricular hypertrophy of the septal  segment. LV outflow tract gradient peak 54 mmHg. Left ventricular  diastolic parameters are consistent with Grade I diastolic dysfunction  (impaired relaxation).   2. Right ventricular systolic function is normal. The right ventricular  size is normal. There is normal pulmonary artery systolic pressure. The  estimated right ventricular systolic pressure is 27.2 mmHg.   3. Left atrial size was mildly dilated.   4. There is mitral chordal and probably valvular systolic anterior  motion. The mitral valve is abnormal. Trivial mitral valve regurgitation.  No evidence of mitral stenosis.   5. The aortic valve is tricuspid. Aortic valve regurgitation is not  visualized.   6. Aortic dilatation noted. There is mild dilatation of the aortic root,  measuring 38 mm.   7. The inferior vena cava is dilated in size with <50% respiratory  variability, suggesting right atrial pressure of 15 mmHg.   8. Findings consistent with HOCM.    LHC 10/24   Prox Cx to Mid Cx lesion is 40% stenosed.   Ost RCA to Prox RCA lesion is 30% stenosed.   Ramus lesion is 60% stenosed.   Mid LAD-1 lesion is  90% stenosed.   Previously placed Prox LAD to Mid LAD stent of unknown type is  widely patent.   Previously placed Mid LAD-3 stent of unknown type is  widely patent.   Non-stenotic RPDA lesion was previously treated.   A drug-eluting stent was successfully placed using a SYNERGY XD 2.75X20.   Post intervention, there is a 0% residual stenosis.   Post  intervention, there is a 0% residual stenosis.   Patent stents in the proximal and mid LAD. Hazy severe stenosis in the proximal to mid LAD between the old stents.  Successful PTCA/DES x 1 proximal to mid LAD Moderate stenosis in the intermediate branch, unchanged from last cath (Flow wire negative last cath) Mild disease in the Circumflex Large dominant RCA with mild proximal disease and patent PDA stent without restenosis.  Normal right heart pressures LVEDP 16-18 mmHg      Review of Systems: [y] = yes, [ ]  = no   General: Weight gain [ ] ; Weight loss [Y ]; Anorexia [ ] ; Fatigue [ ] ; Fever [ ] ; Chills [ ] ; Weakness [ ]   Cardiac: Chest pain/pressure [ ] ; Resting SOB [ ] ; Exertional SOB [ ] ; Orthopnea [ ] ; Pedal Edema [ ] ; Palpitations [ ] ; Syncope [ ] ; Presyncope [ ] ; Paroxysmal nocturnal dyspnea[ ]   Pulmonary: Cough [ ] ; Wheezing[ ] ; Hemoptysis[ ] ; Sputum [ ] ; Snoring [ ]   GI: Vomiting[ ] ; Dysphagia[ ] ; Melena[ ] ; Hematochezia [ ] ; Heartburn[ ] ; Abdominal pain [ ] ; Constipation [ ] ; Diarrhea [ ] ; BRBPR [ ]   GU: Hematuria[ ] ; Dysuria [ ] ; Nocturia[ ]   Vascular: Pain in legs with walking [ ] ; Pain in feet with lying flat [ ] ; Non-healing sores [ ] ; Stroke [ ] ; TIA [ ] ; Slurred speech [ ] ;  Neuro: Headaches[ ] ; Vertigo[ ] ; Seizures[ ] ; Paresthesias[ ] ;Blurred vision [ ] ; Diplopia [ ] ; Vision changes [ ]   Ortho/Skin: Arthritis [ ] ; Joint pain [ ] ; Muscle pain [ ] ; Joint swelling [ ] ; Back Pain [ ] ; Rash [ ]   Psych: Depression[ ] ; Anxiety[ ]   Heme: Bleeding problems [ ] ; Clotting disorders [ ] ; Anemia [ ]   Endocrine: Diabetes [ ] ; Thyroid dysfunction[ ]    Past Medical History:  Diagnosis Date   Acute on chronic diastolic CHF (congestive heart failure) (HCC) 01/03/2023   Cancer (HCC)    prostate cancer surgery 2011   Chronic diastolic heart failure (HCC)    Coronary artery disease    sees Dr. Erlene Quan 3 mths ago.  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 Swallow whole. 90 tablet 2   atorvastatin (LIPITOR) 40 MG tablet Take 1 tablet (40 mg total) by mouth daily. 30 tablet 0   Choline Fenofibrate (FENOFIBRIC ACID) 45 MG CPDR Take 1 capsule by mouth once daily 90 capsule 1   clopidogrel (PLAVIX) 75 MG tablet Take 1 tablet (75 mg total) by mouth daily. 90 tablet 3   Cyanocobalamin (VITAMIN B-12 PO) Take 2,500 mcg by mouth daily.     empagliflozin (JARDIANCE) 10 MG TABS tablet Take 1 tablet (10 mg total) by mouth daily. 30 tablet 0   feeding  supplement (ENSURE ENLIVE / ENSURE PLUS) LIQD Take 237 mLs by mouth 2 (two) times daily between meals. 14220 mL 0   ferrous sulfate 325 (65 FE) MG tablet Take 1 tablet (325 mg total) by mouth daily with breakfast. 30 tablet 0   fluticasone (FLONASE) 50 MCG/ACT nasal spray Place 1 spray into both nostrils daily.     furosemide (LASIX) 40 MG tablet Take 1 tablet (40 mg total) by mouth 2 (two) times daily. 60 tablet 0   Magnesium Citrate 200 MG TABS Take 200 mg by mouth daily.     metoprolol tartrate (LOPRESSOR) 25 MG tablet Take 1 tablet (25 mg total) by mouth daily. 90 tablet 3   Multiple Vitamin (MULTIVITAMIN WITH MINERALS) TABS tablet Take 1 tablet by mouth daily. 30 tablet 0   nitroGLYCERIN (NITROSTAT) 0.4 MG SL tablet Place 1 tablet (0.4 mg total) under the tongue every 5 (five) minutes as needed for chest pain. 25 tablet 3   Omega-3 Fatty Acids (FISH OIL) 1000 MG CAPS Take 1,000 mg by mouth every other day. In the evening, alternating with fenofibrate acid     polyethylene glycol powder (GLYCOLAX/MIRALAX) 17 GM/SCOOP powder Take 17 g by mouth daily. 238 g 0   sertraline (ZOLOFT) 25 MG tablet Take 1 tablet (25 mg total) by mouth daily. 90 tablet 3    spironolactone (ALDACTONE) 25 MG tablet Take 0.5 tablets (12.5 mg total) by mouth daily. 15 tablet 0   terbinafine (LAMISIL) 250 MG tablet Take 1 tablet (250 mg total) by mouth daily with food. 90 tablet 1   triamcinolone cream (KENALOG) 0.1 % Apply topically to the affected area 2 (two) times daily for 2 to 3 weeks 454 g 2   No current facility-administered medications for this encounter.    Allergies  Allergen Reactions   Adhesive [Tape] Other (See Comments)    Mainly just redness.   Has been ruled out for latex allegery   Hydrochlorothiazide Other (See Comments)    H/o Pancreatitis, takes in Avalide at home   Latex Other (See Comments)    unknown   Vantin [Cefpodoxime] Rash      Social History   Socioeconomic History   Marital status: Married    Spouse name: Not on file   Number of children: Not on file   Years of education: Not on file   Highest education level: Not on file  Occupational History   Not on file  Tobacco Use   Smoking status: Never   Smokeless tobacco: Never  Substance and Sexual Activity   Alcohol use: No   Drug use: No   Sexual activity: Not on file  Other Topics Concern   Not on file  Social History Narrative   Not on file   Social Determinants of Health   Financial Resource Strain: Low Risk  (01/15/2023)   Overall Financial Resource Strain (CARDIA)    Difficulty of Paying Living Expenses: Not very hard  Food Insecurity: No Food Insecurity (01/12/2023)   Hunger Vital Sign    Worried About Running Out of Food in the Last Year: Never true    Ran Out of Food in the Last Year: Never true  Transportation Needs: No Transportation Needs (01/12/2023)   PRAPARE - Administrator, Civil Service (Medical): No    Lack of Transportation (Non-Medical): No  Recent Concern: Transportation Needs - Unmet Transportation Needs (12/12/2022)   Received from [employer]    In the past 12  months, has lack of reliable transportation  kept you from medical appointments, meetings, work or from getting things needed for daily living? : Yes  Physical Activity: Not on file  Stress: Not on file  Social Connections: Not on file  Intimate Partner Violence: Not At Risk (01/12/2023)   Humiliation, Afraid, Rape, and Kick questionnaire    Fear of Current or Ex-Partner: No    Emotionally Abused: No    Physically Abused: No    Sexually Abused: No      Family History  Problem Relation Age of Onset   Hypertension Mother    Heart disease Father    Hypertension Father     Vitals:   01/24/23 1459  BP: (!) 90/50  Pulse: (!) 55  SpO2: 99%  Weight: 84.5 kg (186 lb 3.2 oz)    PHYSICAL EXAM: ReDs 35% General:  Well appearing, elderly male in WC. No respiratory difficulty HEENT: normal Neck: supple. no JVD. Carotids 2+ bilat; no bruits. No lymphadenopathy or thryomegaly appreciated. Cor: PMI nondisplaced. Regular rate & rhythm. No rubs, gallops or murmurs. Lungs: decreased BS at the bases  Abdomen: soft, nontender, nondistended. No hepatosplenomegaly. No bruits or masses. Good bowel sounds. Extremities: no cyanosis, clubbing, rash, trace Rt LE edema (chronic post hip surgery), no edema on the left + TEDs Neuro: alert & oriented x 3, cranial nerves grossly intact. moves all 4 extremities w/o difficulty. Affect pleasant.  ECG: not performed    ASSESSMENT & PLAN:  1. HFpEF  - Echo 10/24 EF 65-70%, ? HOCM, RV normal - RHC 10/24 demonstrated mRA 11, PAP 40/11 w/ mean 23, mPCW 7, LVEDP 22, FICK CO 6.12, FICK CI 2.87. - recent acute exacerbation 10/24 w/ volume overload. Trigger felt 2/2 dietary indiscretion w/ sodium  - NYHA II, though not very active at baseline. No dyspnea walking flat surfaces around his home  - Volume stable. Wt continues to trend down. Euvolemic on exam. ReDs 35% - continue Lasix 40 mg bid (may need to scale back pending SCr/BUN) - continue Jardiance 10 mg daily  - continue spironolactone 12.5 mg daily.  BP too soft for titration   - continue metoprolol 25 mg daily - long discussion regarding low sodium diet, fluid restriction and continuation of daily wts - BMP/BNP today   2. ? Hypertrophic Cardiomyopathy vs Hyperdynamic LV with Intracavitary Obstruction -  Echo 10/24 showed hyperdynamic LV function. EF 65-70% w/ mod asymmetric LVH of the septal segment. GIDD, LV outflow tract gradient peaked at 54 mmHg, RV normal. Study was notable for an intracavitary gradient to 54 mmHg, appeared to be chordal systolic anterior motion, seemed to be intracavitary obstruction. There was no posteriorly directed mitral valve regurgitation to suspect mitral valve systolic anterior motion. 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 - now off on Imdur. Avoid nitrates. Preload dependent  - on ? blocker, Lopressor   3. CAD - remote prox + mid LAD stents in 1998 - recent admit 10/24 for [place]. Cath w/ patent previously placed prox and mid LAD stents and new, obstructive, 90% mLAD lesion between the previously placed stents>>treated w/ PCI +DES  - stable w/o CP  - DAPT w/ ASA + Plavix - on statin therapy w/ atorvastatin  - on ? blocker therapy w/ Lopressor    Referred to HFSW (PCP, Medications, Transportation, ETOH Abuse, Drug Abuse, Insurance, Surveyor, quantity ): No Refer to Pharmacy:  No Refer to Home Health: No  Refer  to Advanced Heart Failure Clinic: No  Refer to General Cardiology: Yes (followed by Dr. Allyson Sabal)   Follow up: f/u w/ cardiology in 4 wks   Brielle Moro Sharol Harness, PA-C 01/24/2023

## 2023-01-26 ENCOUNTER — Ambulatory Visit (HOSPITAL_BASED_OUTPATIENT_CLINIC_OR_DEPARTMENT_OTHER): Payer: Medicare PPO | Attending: Pulmonary Disease | Admitting: Internal Medicine

## 2023-01-26 VITALS — Ht 71.0 in | Wt 181.0 lb

## 2023-01-26 DIAGNOSIS — I493 Ventricular premature depolarization: Secondary | ICD-10-CM | POA: Insufficient documentation

## 2023-01-26 DIAGNOSIS — R0602 Shortness of breath: Secondary | ICD-10-CM | POA: Diagnosis present

## 2023-01-26 DIAGNOSIS — R0902 Hypoxemia: Secondary | ICD-10-CM | POA: Diagnosis not present

## 2023-01-26 DIAGNOSIS — G4734 Idiopathic sleep related nonobstructive alveolar hypoventilation: Secondary | ICD-10-CM

## 2023-01-31 ENCOUNTER — Telehealth: Payer: Self-pay | Admitting: Cardiovascular Disease

## 2023-01-31 NOTE — Telephone Encounter (Signed)
Spoke to patient's daughter Jeffrey Acevedo.She stated father has not been feeling well.He is dizzy.No chest pain.He has lost 10 lbs within 1 week.He saw PCP's PA last week.She was not concerned about his dizziness.Stated she would like to schedule appointment.Appointment scheduled with Edd Fabian NP 11/15 at 2:45 pm.Advised if his symptoms worsen he will need to go to ED.

## 2023-01-31 NOTE — Telephone Encounter (Signed)
STAT if patient feels like he/she is going to faint   1. Are you feeling dizzy, lightheaded, or faint right now? Daughter called she is unsure if he is feeling dizzy right now, but he was this morning.      2. Have you passed out?  No he has not passed out.  (If yes move to .[facility])   3. Do you have any other symptoms? Daughter stating he is very unstable on his feet.    4. Have you checked your HR and BP (record if available)? Daughter is unsure if it was checked   Daughter states the home health nurse was there this morning and called the PCP office, they called the daughter and advised her to give [place] a call to see if we wanted to see him sooner than his December 2nd.  Daughter states her mother told he has lost 10lbs in a week.   Daughter states she doesn't live with them to please call her mother Suresh Mitch (patient's wife) [phone].

## 2023-02-01 ENCOUNTER — Telehealth: Payer: Self-pay | Admitting: Cardiovascular Disease

## 2023-02-01 NOTE — Telephone Encounter (Signed)
Spoke to patient's wife who asked if it was okay to give him some of her Meclizine. She report patient has been dizzy since Monday and started vomiting today. She deny any other symptoms at this time. She was advised not to share her Meclizine with patient. She was encouraged to offer fluids, unsalted cracker due to no sodium diet or toast and bland diet/BRAT diet. Informed her I would reach out to Dr Allyson Sabal for any other recommendations.

## 2023-02-01 NOTE — Progress Notes (Signed)
Cardiology Clinic Note   Patient Name: Jeffrey Acevedo Date of Encounter: 02/02/2023  Primary Care Provider:  [name], MD Primary Cardiologist:  [name], MD  Patient Profile    Jeffrey Acevedo 59 male presents to the clinic today for follow-up evaluation of his coronary artery disease and essential hypertension.  Past Medical History    Past Medical History:  Diagnosis Date   Acute on chronic diastolic CHF (congestive heart failure) (HCC) 01/03/2023   Cancer (HCC)    prostate cancer surgery 2011   Chronic diastolic heart failure (HCC)    Coronary artery disease    sees Dr. Erlene Quan 3 mths ago.  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   Hyperlipidemia    Hypertension    Past Surgical History:  Procedure Laterality Date   CARDIAC CATHETERIZATION  06/17/2009   Proximal LAD 90% stenosis just beyond previously stented segment with a Multi-Link Vision bare-metal stent   CORONARY ANGIOPLASTY     stents placed in 1998 and 2011    CORONARY PRESSURE/FFR STUDY N/A 05/02/2021   Procedure: INTRAVASCULAR PRESSURE WIRE/FFR STUDY;  Surgeon: Runell Gess, MD;  Location: [facility];  Service: Cardiovascular;  Laterality: N/A;   CORONARY PRESSURE/FFR STUDY N/A 09/19/2021   Procedure: INTRAVASCULAR PRESSURE WIRE/FFR STUDY;  Surgeon: Runell Gess, MD;  Location: [facility];  Service: Cardiovascular;  Laterality: N/A;  RAMUS   CORONARY STENT INTERVENTION N/A 05/02/2021   Procedure: CORONARY STENT INTERVENTION;  Surgeon: Runell Gess, MD;  Location: [facility];  Service: Cardiovascular;  Laterality: N/A;   CORONARY STENT INTERVENTION N/A 09/19/2021   Procedure: CORONARY STENT INTERVENTION;  Surgeon: Runell Gess, MD;  Location: [facility];  Service: Cardiovascular;  Laterality: N/A;   CORONARY STENT INTERVENTION N/A  12/29/2022   Procedure: CORONARY STENT INTERVENTION;  Surgeon: Kathleene Hazel, MD;  Location: [facility];  Service: Cardiovascular;  Laterality: N/A;   LEFT HEART CATH AND CORONARY ANGIOGRAPHY N/A 05/02/2021   Procedure: LEFT HEART CATH AND CORONARY ANGIOGRAPHY;  Surgeon: Runell Gess, MD;  [facility]: [facility];  Service: Cardiovascular;  Laterality: N/A;   LEFT HEART CATH AND CORONARY ANGIOGRAPHY N/A 09/19/2021   Procedure: LEFT HEART CATH AND CORONARY ANGIOGRAPHY;  Surgeon: Runell Gess, MD;  [facility];  Service: Cardiovascular;  Laterality: N/A;   NASAL SEPTUM SURGERY     Dr. Richardson Landry @ [facility]  2014   [facility]  2011   RIGHT/LEFT HEART CATH AND CORONARY ANGIOGRAPHY N/A 12/29/2022   Procedure: RIGHT/LEFT HEART CATH AND CORONARY ANGIOGRAPHY;  Surgeon: Kathleene Hazel, MD;  [facility];  Service: Cardiovascular;  Laterality: N/A;   ROBOT ASSISTED LAPAROSCOPIC RADICAL PROSTATECTOMY     TOTAL HIP ARTHROPLASTY Right 04/19/2015   Procedure: RIGHT TOTAL HIP ARTHROPLASTY ANTERIOR APPROACH;  Surgeon: Jodi Geralds, MD;  Location: [facility];  Service: Orthopedics;  Laterality: Right;    Allergies  Allergies  Allergen Reactions   Adhesive [Tape] Other (See Comments)    Mainly just redness.   Has been ruled out for latex allegery   Hydrochlorothiazide Other (See Comments)    H/o Pancreatitis, takes in Avalide at home   Latex Other (See Comments)    unknown  Vantin [Cefpodoxime] Rash    History of Present Illness    Jeffrey Acevedo has a PMH of HFpEF, coronary artery disease status post remote LAD stenting in 1998, CKD stage III, HLD, and HTN.  He was admitted 10/25 for lower extremity edema and dyspnea.  He was diagnosed with acute CHF.  This was in the setting of dietary indiscretion related to sodium.  His echocardiogram showed LV function of 65-70% with moderate asymmetric LVH of the septal segment, G1 DD,  normal RV function, chordal systolic anterior motion which seem to be intracavity obstruction.  No posteriorly directed mitral valve regurgitation was noted to suspect mitral valve systolic anterior motion.  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  Due to outflow tract obstruction Imdur was discontinued.  He was started on metoprolol.  He received IV diuresis and was transition back to p.o. Lasix and spironolactone.  His Jardiance was continued.  He was seen in follow-up by Boyce Medici PA-C on 01/24/2023.  He presented with his wife and daughter.  He was in wheelchair.  He reported that his breathing had improved.  He was not very active at baseline.  He denied dyspnea.  He was able to do basic ADLs and ambulate without dyspnea.  He had been monitoring his weight daily.  It had continued to trend down since discharge.  He had made significant changes in his diet.  He was eating less and low-sodium.  His blood pressure was noted to be 90/50.  This was consistent with his baseline blood pressure.  He had no orthostatic symptoms.  He was wearing lower extremity support stockings.  He denied chest pain.  He contacted the nurse triage line 01/31/2023 and reported dizziness.  He denied chest pain.  He had a 10 pound weight loss within 1 week.  He saw his PCPs PA who was not concerned about dizziness.  He denied syncope.  He presents to the clinic today for follow-up evaluation and states he has been having a bloating type sensation.  He notices his symptoms are worse with getting up and moving from side-to-side.  We reviewed his recent hospitalization and follow-up visits.  He expressed understanding.  He presents today with his 2 daughters.  His weight today is 181 pounds.  It has continued to decrease over the last several days.  His blood pressure today is 94/56.  His pulse is 56.  Daughters report they have been following a very strict  low-sodium diet.  He reports that he has only been drinking about 16 ounces daily.  It appears that he has over diuresed.  I will hold his furosemide x 2 days, order a BMP, have him increase his p.o. hydration to 36-48 ounces daily, and increase the sodium in his diet slightly.  I will plan follow-up in around 1 month.  Today he denies chest pain, shortness of breath, lower extremity edema, fatigue, palpitations, melena, hematuria, hemoptysis, diaphoresis, orthopnea, and PND.    Home Medications    Prior to Admission medications   Medication Sig Start Date End Date Taking? Authorizing Provider  aspirin EC 81 MG EC tablet Take 1 tablet (81 mg total) by mouth daily. Swallow whole. 05/04/21   Arty Baumgartner, NP  atorvastatin (LIPITOR) 40 MG tablet Take 1 tablet (40 mg total) by mouth daily. 01/19/23 02/18/23  Arrien, York Ram, MD  Choline Fenofibrate (FENOFIBRIC ACID) 45 MG CPDR Take 1 capsule by mouth once daily 11/02/22  Runell Gess, MD  clopidogrel (PLAVIX) 75 MG tablet Take 1 tablet (75 mg total) by mouth daily. 06/13/22     Cyanocobalamin (VITAMIN B-12 PO) Take 2,500 mcg by mouth daily.    [provider]  empagliflozin (JARDIANCE) 10 MG TABS tablet Take 1 tablet (10 mg total) by mouth daily. 01/18/23 02/17/23  Arrien, York Ram, MD  feeding supplement (ENSURE ENLIVE / ENSURE PLUS) LIQD Take 237 mLs by mouth 2 (two) times daily between meals. 01/18/23 02/17/23  Arrien, York Ram, MD  ferrous sulfate 325 (65 FE) MG tablet Take 1 tablet (325 mg total) by mouth daily with breakfast. 01/19/23 02/18/23  Arrien, York Ram, MD  fluticasone [facility]) 50 MCG/ACT nasal spray Place 1 spray into both nostrils daily.    [provider]  furosemide (LASIX) 40 MG tablet Take 1 tablet (40 mg total) by mouth 2 (two) times daily. 01/18/23 02/17/23  Arrien, York Ram, MD  Magnesium Citrate 200 MG TABS Take 200 mg by mouth daily.    [provider]   metoprolol tartrate (LOPRESSOR) 25 MG tablet Take 1 tablet (25 mg total) by mouth daily. 02/06/22     Multiple Vitamin (MULTIVITAMIN WITH MINERALS) TABS tablet Take 1 tablet by mouth daily. 01/19/23 02/18/23  Arrien, York Ram, MD  nitroGLYCERIN (NITROSTAT) 0.4 MG SL tablet Place 1 tablet (0.4 mg total) under the tongue every 5 (five) minutes as needed for chest pain. 11/14/22   Runell Gess, MD  Omega-3 Fatty Acids (FISH OIL) 1000 MG CAPS Take 1,000 mg by mouth every other day. In the evening, alternating with fenofibrate acid    [provider]  polyethylene glycol powder (GLYCOLAX/MIRALAX) 17 GM/SCOOP powder Take 17 g by mouth daily. 01/18/23   Arrien, York Ram, MD  sertraline (ZOLOFT) 25 MG tablet Take 1 tablet (25 mg total) by mouth daily. 12/19/22     spironolactone (ALDACTONE) 25 MG tablet Take 0.5 tablets (12.5 mg total) by mouth daily. 01/19/23 02/18/23  Arrien, York Ram, MD  terbinafine (LAMISIL) 250 MG tablet Take 1 tablet (250 mg total) by mouth daily with food. 11/02/22     triamcinolone cream (KENALOG) 0.1 % Apply topically to the affected area 2 (two) times daily for 2 to 3 weeks 07/03/22       Family History    Family History  Problem Relation Age of Onset   Hypertension Mother    Heart disease Father    Hypertension Father    He indicated that his mother is deceased. He indicated that his father is deceased.  Social History    Social History   Socioeconomic History   Marital status: Married    Spouse name: Not on file   Number of children: Not on file   Years of education: Not on file   Highest education level: Not on file  Occupational History   Not on file  Tobacco Use   Smoking status: Never   Smokeless tobacco: Never  Substance and Sexual Activity   Alcohol use: No   Drug use: No   Sexual activity: Not on file  Other Topics Concern   Not on file  Social History Narrative   Not on file   Social Determinants of Health    Financial Resource Strain: Low Risk  (01/15/2023)   Overall Financial Resource Strain (CARDIA)    Difficulty of Paying Living Expenses: Not very hard  Food Insecurity: No Food Insecurity (01/12/2023)   Hunger Vital Sign    Worried About  Running Out of Food in the Last Year: Never true    Ran Out of Food in the Last Year: Never true  Transportation Needs: No Transportation Needs (01/12/2023)   PRAPARE - Administrator, Civil Service (Medical): No    Lack of Transportation (Non-Medical): No  Recent Concern: Transportation Needs - Unmet Transportation Needs (12/12/2022)   Received from [facility]   Transportation    In the past 12 months, has lack of reliable transportation kept you from medical appointments, meetings, work or from getting things needed for daily living? : Yes  Physical Activity: Not on file  Stress: Not on file  Social Connections: Not on file  Intimate Partner Violence: Not At Risk (01/12/2023)   Humiliation, Afraid, Rape, and Kick questionnaire    Fear of Current or Ex-Partner: No    Emotionally Abused: No    Physically Abused: No    Sexually Abused: No     Review of Systems    General:  No chills, fever, night sweats or weight changes.  Cardiovascular:  No chest pain, dyspnea on exertion, edema, orthopnea, palpitations, paroxysmal nocturnal dyspnea. Dermatological: No rash, lesions/masses Respiratory: No cough, dyspnea Urologic: No hematuria, dysuria Abdominal:   No nausea, vomiting, diarrhea, bright red blood per rectum, melena, or hematemesis Neurologic:  No visual changes, wkns, changes in mental status. All other systems reviewed and are otherwise negative except as noted above.  Physical Exam    VS:  BP (!) 94/56 (BP Location: Right Arm, Patient Position: Sitting, Cuff Size: Normal)   Pulse (!) 56   Ht 6' (1.829 m)   Wt 181 lb (82.1 kg)   BMI 24.55 kg/m  , BMI Body mass index is 24.55 kg/m. GEN: Well nourished, well developed, in  no acute distress. HEENT: normal. Neck: Supple, no JVD, carotid bruits, or masses. Cardiac: RRR, no murmurs, rubs, or gallops. No clubbing, cyanosis, edema.  Radials/DP/PT 2+ and equal bilaterally.  Respiratory:  Respirations regular and unlabored, clear to auscultation bilaterally. GI: Soft, nontender, nondistended, BS + x 4. MS: no deformity or atrophy. Skin: warm and dry, no rash. Neuro:  Strength and sensation are intact. Psych: Normal affect.  Accessory Clinical Findings    Recent Labs: 01/03/2023: TSH 2.052 01/12/2023: Magnesium 2.3 01/13/2023: ALT 15; Hemoglobin 9.4; Platelets 198 01/24/2023: B Natriuretic Peptide 87.3; BUN 27; Creatinine, Ser 1.58; Potassium 4.5; Sodium 134   Recent Lipid Panel    Component Value Date/Time   CHOL 70 01/15/2023 0258   CHOL 103 11/15/2022 0837   TRIG 273 (H) 01/15/2023 0258   HDL <10 (L) 01/15/2023 0258   HDL 24 (L) 11/15/2022 0837   CHOLHDL NOT CALCULATED 01/15/2023 0258   VLDL 55 (H) 01/15/2023 0258   LDLCALC NOT CALCULATED 01/15/2023 0258   LDLCALC 59 11/15/2022 0837         ECG personally reviewed by me today- EKG Interpretation Date/Time:  Friday February 02 2023 15:03:34 EST Ventricular Rate:  56 PR Interval:  240 QRS Duration:  90 QT Interval:  430 QTC Calculation: 414 R Axis:   10  Text Interpretation: Sinus bradycardia with 1st degree A-V block with When compared with ECG of 12-Jan-2023 10:53, Fusion complexes are now Present Confirmed by Edd Fabian [phone]) on 02/02/2023 3:27:51 PM    Echocardiogram 01/04/2023  IMPRESSIONS     1. Left ventricular ejection fraction, by estimation, is 65 to 70%. The  left ventricle has hyperdynamic function. The left ventricle has no  regional wall motion abnormalities.  There is moderate asymmetric left  ventricular hypertrophy of the septal  segment. LV outflow tract gradient peak 54 mmHg. Left ventricular  diastolic parameters are consistent with Grade I diastolic  dysfunction  (impaired relaxation).   2. Right ventricular systolic function is normal. The right ventricular  size is normal. There is normal pulmonary artery systolic pressure. The  estimated right ventricular systolic pressure is 27.2 mmHg.   3. Left atrial size was mildly dilated.   4. There is mitral chordal and probably valvular systolic anterior  motion. The mitral valve is abnormal. Trivial mitral valve regurgitation.  No evidence of mitral stenosis.   5. The aortic valve is tricuspid. Aortic valve regurgitation is not  visualized.   6. Aortic dilatation noted. There is mild dilatation of the aortic root,  measuring 38 mm.   7. The inferior vena cava is dilated in size with <50% respiratory  variability, suggesting right atrial pressure of 15 mmHg.   8. Findings consistent with HOCM.   FINDINGS   Left Ventricle: Left ventricular ejection fraction, by estimation, is 65  to 70%. The left ventricle has hyperdynamic function. The left ventricle  has no regional wall motion abnormalities. The left ventricular internal  cavity size was normal in size.  There is moderate asymmetric left ventricular hypertrophy of the septal  segment. Left ventricular diastolic parameters are consistent with Grade I  diastolic dysfunction (impaired relaxation).   Right Ventricle: The right ventricular size is normal. No increase in  right ventricular wall thickness. Right ventricular systolic function is  normal. There is normal pulmonary artery systolic pressure. The tricuspid  regurgitant velocity is 1.75 m/s, and   with an assumed right atrial pressure of 15 mmHg, the estimated right  ventricular systolic pressure is 27.2 mmHg.   Left [facility]: Left atrial size was mildly dilated.   Right [facility]: Right atrial size was normal in size.   Pericardium: There is no evidence of pericardial effusion.   Mitral Valve: There is mitral chordal and probably valvular systolic  anterior motion. The mitral  valve is abnormal. Trivial mitral valve  regurgitation. No evidence of mitral valve stenosis.   Tricuspid Valve: The tricuspid valve is normal in structure. Tricuspid  valve regurgitation is trivial.   Aortic Valve: The aortic valve is tricuspid. Aortic valve regurgitation is  not visualized.   Pulmonic Valve: The pulmonic valve was normal in structure. Pulmonic valve  regurgitation is not visualized.   Aorta: Aortic dilatation noted. There is mild dilatation of the aortic  root, measuring 38 mm.   Venous: The inferior vena cava is dilated in size with less than 50%  respiratory variability, suggesting right atrial pressure of 15 mmHg.   IAS/Shunts: No atrial level shunt detected by color flow Doppler.       Assessment & Plan   1.  Dizziness-contacted nurse triage line on 01/31/2023.  Reported a 10 pound weight loss within a week.  Hospital admission 01/12/2023 with CHF exacerbation.  He required IV diuresis.   Increase p.o. hydration-recommended 36-48 ounces daily Slightly increase sodium in his diet Change positions slowly  Chronic diastolic CHF-weight today 181 pounds which is down from 186 pounds on 01/24/2023.  Breathing at baseline.  Appears to be over diuresed. Hold furosemide x 2 days Continue Jardiance, metoprolol, spironolactone Continue Daily weights Order BMP  Essential hypertension-BP today 94/56. Continue current medical therapy Maintain blood pressure log Continue low-sodium diet  Coronary artery disease-no chest pain today.  Denies exertional chest discomfort. Continue aspirin,  atorvastatin, metoprolol Heart healthy low-sodium high-fiber diet Increase physical activity as tolerated  Hyperlipidemia-continue atorvastatin. High-fiber diet Omega-3 fatty acids, a atorvastatin, aspirin  Disposition: Follow-up with Dr.Berry or me in 3-6 weeks.   Thomasene Ripple. Omolara Carol NP-C     02/02/2023, 4:30 PM [facility] [address] Office [phone] Fax [phone]    I spent 14 minutes examining this patient, reviewing medications, and using patient centered shared decision making involving her cardiac care.   I spent greater than 20 minutes reviewing her past medical history,  medications, and prior cardiac tests.

## 2023-02-01 NOTE — Telephone Encounter (Signed)
Pt's spouse is calling wanting to know if it's okay for her to give pt one of her Meclizine 25MG  tablets to help him settle down a bit. She stated he's having some dizziness and very fatigue but today was the first day of him vomiting. Although pt will be seen tomorrow for a visit, she'd like to get the OK on giving him this to help until he's able to be seen tomorrow. Please advise

## 2023-02-02 ENCOUNTER — Encounter: Payer: Self-pay | Admitting: General Practice

## 2023-02-02 ENCOUNTER — Encounter (HOSPITAL_COMMUNITY): Payer: Medicare PPO

## 2023-02-02 ENCOUNTER — Ambulatory Visit: Payer: Medicare PPO | Attending: General Practice | Admitting: General Practice

## 2023-02-02 VITALS — BP 94/56 | HR 56 | Ht 72.0 in | Wt 181.0 lb

## 2023-02-02 DIAGNOSIS — I2510 Atherosclerotic heart disease of native coronary artery without angina pectoris: Secondary | ICD-10-CM

## 2023-02-02 DIAGNOSIS — I5032 Chronic diastolic (congestive) heart failure: Secondary | ICD-10-CM | POA: Diagnosis not present

## 2023-02-02 DIAGNOSIS — R42 Dizziness and giddiness: Secondary | ICD-10-CM | POA: Diagnosis not present

## 2023-02-02 DIAGNOSIS — E782 Mixed hyperlipidemia: Secondary | ICD-10-CM

## 2023-02-02 DIAGNOSIS — I10 Essential (primary) hypertension: Secondary | ICD-10-CM

## 2023-02-02 DIAGNOSIS — I2583 Coronary atherosclerosis due to lipid rich plaque: Secondary | ICD-10-CM

## 2023-02-02 NOTE — Patient Instructions (Signed)
Medication Instructions:  Due to low blood pressure hold your furosemide for 2 full days then resume current dose. Increase oral intake (fluids) to 36-48 oz per day. Have fluids available beside you to be able to sip through out the day.    Lab Work: Nutritional therapist today. If you have labs (blood work) drawn today and your tests are completely normal, you will receive your results only by: MyChart Message (if you have MyChart) OR A paper copy in the mail If you have any lab test that is abnormal or we need to change your treatment, we will call you to review the results.   Follow-Up: At [facility], you and your health needs are our priority.  As part of our continuing mission to provide you with exceptional heart care, we have created designated Provider Care Teams.  These Care Teams include your primary Cardiologist (physician) and Advanced Practice Providers (APPs -  Physician Assistants and Nurse Practitioners) who all work together to provide you with the care you need, when you need it.  We recommend signing up for the patient portal called "MyChart".  Sign up information is provided on this After Visit Summary.  MyChart is used to connect with patients for Virtual Visits (Telemedicine).  Patients are able to view lab/test results, encounter notes, upcoming appointments, etc.  Non-urgent messages can be sent to your provider as well.   To learn more about what you can do with MyChart, go to [email or web address].    Your next appointment:   1 month(s)  Provider:   Edd Fabian, FNP     Other Instructions  [facility] hand out reviewed with APP during office visit.  Increase physical activity as tolerated.

## 2023-02-02 NOTE — Telephone Encounter (Signed)
Patient identification verified by 2 forms. Jeffrey Rail, RN   Called and spoke to patients wife Jeffrey Acevedo states patient is being seen in OV today  [name] has no questions at this time

## 2023-02-03 DIAGNOSIS — R0602 Shortness of breath: Secondary | ICD-10-CM | POA: Diagnosis not present

## 2023-02-03 DIAGNOSIS — G4736 Sleep related hypoventilation in conditions classified elsewhere: Secondary | ICD-10-CM | POA: Diagnosis not present

## 2023-02-03 LAB — BASIC METABOLIC PANEL
BUN/Creatinine Ratio: 21 (ref 10–24)
BUN: 33 mg/dL — ABNORMAL HIGH (ref 8–27)
CO2: 28 mmol/L (ref 20–29)
Calcium: 9.5 mg/dL (ref 8.6–10.2)
Chloride: 94 mmol/L — ABNORMAL LOW (ref 96–106)
Creatinine, Ser: 1.55 mg/dL — ABNORMAL HIGH (ref 0.76–1.27)
Glucose: 93 mg/dL (ref 70–99)
Potassium: 4.7 mmol/L (ref 3.5–5.2)
Sodium: 134 mmol/L (ref 134–144)
eGFR: 45 mL/min/{1.73_m2} — ABNORMAL LOW (ref >59)

## 2023-02-03 NOTE — Procedures (Signed)
     Patient Name: Jeffrey Acevedo, Jeffrey Acevedo Date: 01/26/2023 Gender: Male D.O.B: 1940-12-27 Age (years): 89 Referring Provider: Janann Colonel MD Height (inches): 71 Interpreting Physician: Jetty Duhamel MD, ABSM Weight (lbs): 181 RPSGT: Rolene Arbour BMI: 25 MRN: [id number] Neck Size: 15.00  CLINICAL INFORMATION Sleep Study Type: NPSG Indication for sleep study: Insomnia with OSA Epworth Sleepiness Score: 20  SLEEP STUDY TECHNIQUE As per the AASM Manual for the Scoring of Sleep and Associated Events v2.3 (April 2016) with a hypopnea requiring 4% desaturations.  The channels recorded and monitored were frontal, central and occipital EEG, electrooculogram (EOG), submentalis EMG (chin), nasal and oral airflow, thoracic and abdominal wall motion, anterior tibialis EMG, snore microphone, electrocardiogram, and pulse oximetry.  MEDICATIONS Medications self-administered by patient taken the night of the study : none reported  SLEEP ARCHITECTURE The study was initiated at 9:37:03 PM and ended at 3:47:07 AM.  Sleep onset time was 8.3 minutes and the sleep efficiency was 89.0%. The total sleep time was 329.3 minutes.  Stage REM latency was 9.0 minutes.  The patient spent 0.3% of the night in stage N1 sleep, 87.9% in stage N2 sleep, 0.0% in stage N3 and 11.8% in REM.  Alpha intrusion was absent.  Supine sleep was 34.71%.  RESPIRATORY PARAMETERS The overall apnea/hypopnea index (AHI) was 0.7 per hour. There were 2 total apneas, including 0 obstructive, 2 central and 0 mixed apneas. There were 2 hypopneas and 4 RERAs.  The AHI during Stage REM sleep was 1.5 per hour.  AHI while supine was 1.0 per hour.  The mean oxygen saturation was 90.8%. The minimum SpO2 during sleep was 81.0%.  soft snoring was noted during this study.  CARDIAC DATA The 2 lead EKG demonstrated sinus rhythm. The mean heart rate was 58.5 beats per minute. Other EKG findings include: None.  LEG  MOVEMENT DATA The total PLMS were 0 with a resulting PLMS index of 0.0. Associated arousal with leg movement index was 2.2 .  IMPRESSIONS - No significant obstructive sleep apnea occurred during this study (AHI = 0.7/h). - No significant central sleep apnea occurred during this study (CAI = 0.4/h). - Oxygen desaturation was noted during this study (Min O2 = 81.0%). Tech added O2 2L per protocol to keep O2 saturation above 89%. - The patient snored with soft snoring volume. - No cardiac abnormalities were noted during this study- rare PVC. - Limb movement without arousal total 157 (28.6/hr). Limb movement with arousal / awakening total 12 (2.2/hr).  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  - Consider if treatment for limb movement sleep disorder would be helpful. - Be careful with alcohol, sedatives and other CNS depressants that may worsen sleep apnea and disrupt normal sleep architecture. - Sleep hygiene should be reviewed to assess factors that may improve sleep quality. - Weight management and regular exercise should be initiated or continued if appropriate.  [Electronically signed] 02/03/2023 01:04 PM  Jetty Duhamel MD, ABSM Diplomate, American Board of Sleep Medicine NPI: [id number]                        Jetty Duhamel Diplomate, American Board of Sleep Medicine  ELECTRONICALLY SIGNED ON:  02/03/2023, 12:58 PM [facility] PH: [phone]   FX: [phone] ACCREDITED BY THE AMERICAN ACADEMY OF SLEEP MEDICINE

## 2023-02-05 ENCOUNTER — Other Ambulatory Visit (HOSPITAL_COMMUNITY): Payer: Self-pay

## 2023-02-05 ENCOUNTER — Other Ambulatory Visit: Payer: Self-pay

## 2023-02-06 ENCOUNTER — Other Ambulatory Visit (HOSPITAL_COMMUNITY): Payer: Self-pay

## 2023-02-06 ENCOUNTER — Other Ambulatory Visit: Payer: Self-pay

## 2023-02-07 ENCOUNTER — Telehealth: Payer: Self-pay | Admitting: *Deleted

## 2023-02-07 NOTE — Telephone Encounter (Signed)
Called  left detailed results of lab work message on cell phone line  per DPR  - any question  may call back

## 2023-02-07 NOTE — Telephone Encounter (Signed)
-----   Message from Ronney Asters sent at 02/04/2023 10:35 AM EST ----- Please contact Jeffrey Acevedo and let him know that his lab work is been reviewed.  His electrolytes and renal function are stable at this time.  We will continue with his current medication regimen and slightly increased hydration.  Thank you.

## 2023-02-07 NOTE — Telephone Encounter (Signed)
Called left message on home number to contact office about results  Called  left detailed results of lab work message on cell phone line  per DPR  - any question  may call back

## 2023-02-09 ENCOUNTER — Other Ambulatory Visit: Payer: Self-pay

## 2023-02-09 ENCOUNTER — Other Ambulatory Visit (HOSPITAL_COMMUNITY): Payer: Self-pay

## 2023-02-11 DIAGNOSIS — I517 Cardiomegaly: Secondary | ICD-10-CM | POA: Diagnosis not present

## 2023-02-12 ENCOUNTER — Other Ambulatory Visit: Payer: Self-pay

## 2023-02-19 ENCOUNTER — Ambulatory Visit: Payer: Medicare PPO | Admitting: Cardiology

## 2023-02-19 ENCOUNTER — Other Ambulatory Visit (HOSPITAL_COMMUNITY): Payer: Self-pay

## 2023-02-19 MED ORDER — SPIRONOLACTONE 25 MG PO TABS
25.0000 mg | ORAL_TABLET | Freq: Every day | ORAL | 0 refills | Status: DC
  Filled 2023-02-19: qty 90, 90d supply, fill #0

## 2023-02-20 ENCOUNTER — Encounter: Payer: Self-pay | Admitting: *Deleted

## 2023-02-20 ENCOUNTER — Other Ambulatory Visit (HOSPITAL_COMMUNITY): Payer: Self-pay

## 2023-02-20 NOTE — PMR Pre-admission (Shared)
PMR Admission Coordinator Pre-Admission Assessment  Patient: Jeffrey Acevedo is an 82 y.o., male MRN: [id number] DOB: 1940/05/03 Height: 6' (1.829 m) Weight: 80.3 kg  Insurance Information HMO: ***    PPO: ***     PCP: ***     IPA: ***     80/20: ***     OTHER: *** PRIMARY: [employer]       Policy#: [id number]      Subscriber: self CM Name: ***      Phone#: ***     Fax#: *** Pre-Cert#: ***      Employer: *** Benefits:  Phone #: ***     Name: *** Eff. Date: ***     Deduct: ***      Out of Pocket Max: ***      Life Max: *** CIR: ***      SNF: *** Outpatient: ***     Co-Pay: *** Home Health: ***      Co-Pay: *** DME: ***     Co-Pay: *** Providers: in network  SECONDARY:        Policy#:        Phone#:    Artist:        Phone#:    The Data processing manager" for patients in Inpatient Rehabilitation Facilities with attached "Privacy Act Statement-Health Care Records" was provided and verbally reviewed with: Patient and Family  Emergency Contact Information Contact Information     Name Relation Home Work Mobile   [place] Spouse [phone]     Kathryn, [place] Daughter   [phone]   Nancy Fetter Daughter [phone]  [phone]      Other Contacts   None on File     Current Medical History  Patient Admitting Diagnosis: CVA  History of Present Illness: An 82 yo male with MCI/dementia, CHF, CAD s/p PCI (on DAPT), HTN, and HLD who presented 02/11/23 to [facility] ED with right sided weakness over the preceding 1-2 days.  CT head was negative for acute intracranial pathology.  CTA head/neck was negative for large vessel occlusion or high-grade stenosis.  TTE showed no signs of embolic source.  LDL 18, A1C 4.8%.  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  PT/OT/SLP evaluations completed with recommendations for acute inpatient rehab admission     Patient's medical record from [facility] has been reviewed by the rehabilitation admission coordinator and physician.  Past Medical History  Past Medical History:  Diagnosis Date   Acute on chronic diastolic CHF (congestive heart failure) (HCC) 01/03/2023   Cancer (HCC)    prostate cancer surgery 2011   Chronic diastolic heart failure (HCC)    Coronary artery disease    sees Dr. Erlene Quan 3 mths ago.  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 No  Family History   family history includes Heart disease in his father; Hypertension in his father and mother.  Current Medications  Current Outpatient Medications:    aspirin EC 81 MG EC tablet, Take 1 tablet (81 mg total) by mouth daily. Swallow whole. (Patient taking differently: Take 81 mg by mouth every morning. Swallow whole.), Disp: 90 tablet, Rfl: 2   atorvastatin (  LIPITOR) 40 MG tablet, Take 1 tablet (40 mg total) by mouth daily. (Patient taking differently: Take 40 mg by mouth every morning.), Disp: 30 tablet, Rfl: 0   Choline Fenofibrate (FENOFIBRIC ACID) 45 MG CPDR, Take 1 capsule by mouth once daily (Patient taking differently: Take 1 tablet by mouth at bedtime.), Disp: 90 capsule, Rfl: 1   clopidogrel (PLAVIX) 75 MG tablet, Take 1 tablet (75 mg total) by mouth daily. (Patient taking differently: Take 75 mg by mouth every morning.), Disp: 90 tablet, Rfl: 3   Cyanocobalamin (VITAMIN B-12 PO), Take 2,500 mcg by mouth every morning., Disp: , Rfl:    empagliflozin (JARDIANCE) 10 MG TABS tablet, Take 1 tablet (10 mg total) by mouth daily., Disp: 30 tablet, Rfl: 0   ferrous sulfate 325 (65 FE) MG tablet, Take 1 tablet (325 mg total) by mouth daily with breakfast., Disp: 30 tablet, Rfl: 0   fluticasone (FLONASE) 50 MCG/ACT nasal spray, Place 1  spray into both nostrils daily., Disp: , Rfl:    furosemide (LASIX) 40 MG tablet, Take 1 tablet (40 mg total) by mouth 2 (two) times daily., Disp: 60 tablet, Rfl: 0   MAGNESIUM CITRATE PO, Take 400 mg by mouth every morning., Disp: , Rfl:    metoprolol tartrate (LOPRESSOR) 25 MG tablet, Take 1 tablet (25 mg total) by mouth daily., Disp: 90 tablet, Rfl: 3   nitroGLYCERIN (NITROSTAT) 0.4 MG SL tablet, Place 1 tablet (0.4 mg total) under the tongue every 5 (five) minutes as needed for chest pain., Disp: 25 tablet, Rfl: 3   Omega-3 Fatty Acids (FISH OIL) 1000 MG CAPS, Take 1,000 mg by mouth every other day., Disp: , Rfl:    polyethylene glycol powder (GLYCOLAX/MIRALAX) 17 GM/SCOOP powder, Take 17 g by mouth daily. (Patient taking differently: Take 17 g by mouth as needed.), Disp: 238 g, Rfl: 0   sertraline (ZOLOFT) 25 MG tablet, Take 1 tablet (25 mg total) by mouth daily., Disp: 90 tablet, Rfl: 3   spironolactone (ALDACTONE) 25 MG tablet, Take 0.5 tablets (12.5 mg total) by mouth daily., Disp: 15 tablet, Rfl: 0   spironolactone (ALDACTONE) 25 MG tablet, Take 1 tablet (25 mg total) by mouth daily., Disp: 90 tablet, Rfl: 0   triamcinolone cream (KENALOG) 0.1 %, Apply topically to the affected area 2 (two) times daily for 2 to 3 weeks (Patient taking differently: Apply 1 Application topically as needed.), Disp: 454 g, Rfl: 2  Patients Current Diet: Diet Regular diet, thin liquids  Precautions / Restrictions Precautions: Fall Precautions/Special Needs: Swallowing Weight Bearing Restrictions: No    Has the patient had 2 or more falls or a fall with injury in the past year? Yes  Prior Activity Level Household: Homebound with visits to MD appointment only since 09/24   Prior Functional Level Self Care: Did the patient need help bathing, dressing, using the toilet or eating? Needed some help  Indoor Mobility: Did the patient need assistance with walking from room to room (with or without device)?  Needed some help  Stairs: Did the patient need assistance with internal or external stairs (with or without device)? Dependent  Functional Cognition: Did the patient need help planning regular tasks such as shopping or remembering to take medications? Needed some help  Patient Information Are you of Hispanic, Latino/a,or Spanish origin?: A. No, not of Hispanic, Latino/a, or Spanish origin What is your race?: A. White Do you need or want an interpreter to communicate with a doctor or health care staff?: 0.  No  Patient's Response To:  Health Literacy and Transportation Is the patient able to respond to health literacy and transportation needs?: Yes Health Literacy - How often do you need to have someone help you when you read instructions, pamphlets, or other written material from your doctor or pharmacy?: Always In the past 12 months, has lack of transportation kept you from medical appointments or from getting medications?: No In the past 12 months, has lack of transportation kept you from meetings, work, or from getting things needed for daily living?: No  Home Assistive Devices / Research scientist (physical sciences), transport chair, manual wheelchair  Prior Device Use: Indicate devices/aids used by the patient prior to current illness, exacerbation or injury? Manual wheelchair and Walker   Prior Functional Level Current Functional Level  Bed Mobility  Independent Max assist   Transfers  Independent  Mod assist   Mobility - Walk/Wheelchair  Mod Independent  Mod assist   Upper Body Dressing  Mod Independent  Mod assist   Lower Body Dressing  Min assist  Max assist   Grooming  Independent  Min assist   Eating/Drinking  Independent  Mod assist   Toilet Transfer  Independent  Total assist   Bladder Continence   Continent  Continent   Bowel Management  Continent  Continent   Stair Climbing  Max assist  Total assist   Communication  Verbally intact  Verbally  intact, but is forgetful   Memory  Impaired, history of dementia Impaired     Special Needs/ Care Considerations {Special Care Needs/Care Considerations:304600605}  Previous Home Environment (from acute therapy documentation) Living Arrangements: Spouse/significant other  Lives With: Spouse Available Help at Discharge: Family; Personal care attendant; Available 24 hours/day Type of Home: House Home Layout: One level; Multi-level Alternate Level Stairs-Number of Steps: Flight Home Access: Stairs to enter Secretary/administrator of Steps: 5 Bathroom Shower/Tub: Health visitor: Standard Bathroom Accessibility: Yes How Accessible: Accessible via walker Additional Comments: Has a chair lift   Discharge Living Setting Plans for Discharge Living Setting: House; Lives with (comment) (Plans to DC home to Dtrs home with wife.) Type of Home at Discharge: House Discharge Home Layout: One level Discharge Home Access: Stairs to enter Entrance Stairs-Rails: Left Entrance Stairs-Number of Steps: 3-4 steps Discharge Bathroom Shower/Tub: Curtain; Walk-in shower Discharge Bathroom Toilet: Handicapped height Discharge Bathroom Accessibility: Yes How Accessible: Accessible via walker Does the patient have any problems obtaining your medications?: No   Social/Family/Support Systems Patient Roles: Spouse; Parent (Lives with dtr, SIL, wife, grandchildren) Contact Information: Nancy Fetter - daughter - [phone] Anticipated Caregiver: Dtrs and caregivers hired to assist Anticipated Caregiver's Contact Information: Selena Batten - daughter - [phone] Ability/Limitations of Caregiver: Dtr Selena Batten works days, wife cannot assist much, has hired caregivers during the day 9 am to 5 pm. Caregiver Availability: 24/7 Discharge Plan Discussed with Primary Caregiver: Yes Is Caregiver In Agreement with Plan?: Yes Does Caregiver/Family have Issues with Lodging/Transportation while Pt is in Rehab?:  No   Goals Patient/Family Goal for Rehab: PT/OT/SLP supervision to min assist goals Expected length of stay: 12-14 days Pt/Family Agrees to Admission and willing to participate: Yes Program Orientation Provided & Reviewed with Pt/Caregiver Including Roles  & Responsibilities: Yes   Decrease burden of Care through IP rehab admission: N/A  Possible need for SNF placement upon discharge: Not planned  Patient Condition: I have reviewed medical records from [facility], spoken with CM, and daughter. I discussed via phone for inpatient rehabilitation assessment.  Patient will benefit from ongoing PT, OT, and SLP, can actively participate in 3 hours of therapy a day 5 days of the week, and can make measurable gains during the admission.  Patient will also benefit from the coordinated team approach during an Inpatient Acute Rehabilitation admission.  The patient will receive intensive therapy as well as Rehabilitation physician, nursing, social worker, and care management interventions.  Due to bladder management, bowel management, safety, skin/wound care, disease management, medication administration, pain management, and patient education the patient requires 24 hour a day rehabilitation nursing.  The patient is currently mod to max assist with mobility and basic ADLs.  Discharge setting and therapy post discharge at home with home health is anticipated.  Patient has agreed to participate in the Acute Inpatient Rehabilitation Program and will admit {Time; today/tomorrow:10263}.  Preadmission Screen Completed By:  Trish Mage, 02/20/2023 11:55 AM ______________________________________________________________________   Discussed status with Dr. [name] on *** at *** and received approval for admission today.  Admission Coordinator:  Trish Mage, RN, time Marland KitchenDorna Bloom ***   Assessment/Plan: Diagnosis: Does the need for close, 24 hr/day Medical supervision in concert with the  patient's rehab needs make it unreasonable for this patient to be served in a less intensive setting? {yes_no_potentially:3041433} Co-Morbidities requiring supervision/potential complications: *** Due to {due ZO:1096045}, does the patient require 24 hr/day rehab nursing? {yes_no_potentially:3041433} Does the patient require coordinated care of a physician, rehab nurse, PT, OT, and SLP to address physical and functional deficits in the context of the above medical diagnosis(es)? {yes_no_potentially:3041433} Addressing deficits in the following areas: {deficits:3041436} Can the patient actively participate in an intensive therapy program of at least 3 hrs of therapy 5 days a week? {yes_no_potentially:3041433} The potential for patient to make measurable gains while on inpatient rehab is {potential:3041437} Anticipated functional outcomes upon discharge from inpatient rehab: {functional outcomes:304600100} PT, {functional outcomes:304600100} OT, {functional outcomes:304600100} SLP Estimated rehab length of stay to reach the above functional goals is: *** Anticipated discharge destination: {anticipated dc setting:21604} 10. Overall Rehab/Functional Prognosis: {potential:3041437}  MD Signature ***

## 2023-02-21 ENCOUNTER — Other Ambulatory Visit (HOSPITAL_BASED_OUTPATIENT_CLINIC_OR_DEPARTMENT_OTHER): Payer: Self-pay

## 2023-02-22 ENCOUNTER — Inpatient Hospital Stay (HOSPITAL_COMMUNITY): Payer: Medicare PPO

## 2023-02-22 ENCOUNTER — Encounter: Payer: Self-pay | Admitting: Physical Medicine and Rehabilitation

## 2023-02-22 ENCOUNTER — Other Ambulatory Visit: Payer: Self-pay

## 2023-02-22 ENCOUNTER — Encounter (HOSPITAL_COMMUNITY): Payer: Self-pay | Admitting: Physical Medicine & Rehabilitation

## 2023-02-22 ENCOUNTER — Other Ambulatory Visit: Payer: Self-pay | Admitting: Physical Medicine and Rehabilitation

## 2023-02-22 ENCOUNTER — Inpatient Hospital Stay (HOSPITAL_COMMUNITY)
Admission: AD | Admit: 2023-02-22 | Discharge: 2023-03-09 | DRG: 057 | Disposition: A | Discharge status: Home Health | Payer: Medicare PPO | Source: Other Acute Inpatient Hospital | Attending: Physical Medicine & Rehabilitation | Admitting: Physical Medicine & Rehabilitation

## 2023-02-22 DIAGNOSIS — C8590 Non-Hodgkin lymphoma, unspecified, unspecified site: Secondary | ICD-10-CM | POA: Diagnosis present

## 2023-02-22 DIAGNOSIS — D638 Anemia in other chronic diseases classified elsewhere: Secondary | ICD-10-CM | POA: Diagnosis present

## 2023-02-22 DIAGNOSIS — Z8249 Family history of ischemic heart disease and other diseases of the circulatory system: Secondary | ICD-10-CM | POA: Diagnosis not present

## 2023-02-22 DIAGNOSIS — I639 Cerebral infarction, unspecified: Principal | ICD-10-CM | POA: Diagnosis present

## 2023-02-22 DIAGNOSIS — C8518 Unspecified B-cell lymphoma, lymph nodes of multiple sites: Secondary | ICD-10-CM | POA: Diagnosis present

## 2023-02-22 DIAGNOSIS — I5032 Chronic diastolic (congestive) heart failure: Secondary | ICD-10-CM | POA: Diagnosis present

## 2023-02-22 DIAGNOSIS — G8929 Other chronic pain: Secondary | ICD-10-CM | POA: Diagnosis present

## 2023-02-22 DIAGNOSIS — N1831 Chronic kidney disease, stage 3a: Secondary | ICD-10-CM | POA: Diagnosis present

## 2023-02-22 DIAGNOSIS — H9193 Unspecified hearing loss, bilateral: Secondary | ICD-10-CM | POA: Diagnosis present

## 2023-02-22 DIAGNOSIS — Z66 Do not resuscitate: Secondary | ICD-10-CM | POA: Diagnosis present

## 2023-02-22 DIAGNOSIS — I63133 Cerebral infarction due to embolism of bilateral carotid arteries: Secondary | ICD-10-CM

## 2023-02-22 DIAGNOSIS — J9611 Chronic respiratory failure with hypoxia: Secondary | ICD-10-CM | POA: Diagnosis present

## 2023-02-22 DIAGNOSIS — M25511 Pain in right shoulder: Secondary | ICD-10-CM | POA: Diagnosis present

## 2023-02-22 DIAGNOSIS — C8599 Non-Hodgkin lymphoma, unspecified, extranodal and solid organ sites: Secondary | ICD-10-CM

## 2023-02-22 DIAGNOSIS — R414 Neurologic neglect syndrome: Secondary | ICD-10-CM | POA: Diagnosis present

## 2023-02-22 DIAGNOSIS — H532 Diplopia: Secondary | ICD-10-CM | POA: Diagnosis present

## 2023-02-22 DIAGNOSIS — F329 Major depressive disorder, single episode, unspecified: Secondary | ICD-10-CM | POA: Diagnosis present

## 2023-02-22 DIAGNOSIS — I69351 Hemiplegia and hemiparesis following cerebral infarction affecting right dominant side: Secondary | ICD-10-CM | POA: Diagnosis present

## 2023-02-22 DIAGNOSIS — R7303 Prediabetes: Secondary | ICD-10-CM | POA: Diagnosis present

## 2023-02-22 DIAGNOSIS — Z806 Family history of leukemia: Secondary | ICD-10-CM

## 2023-02-22 DIAGNOSIS — Z9181 History of falling: Secondary | ICD-10-CM

## 2023-02-22 DIAGNOSIS — C8338 Diffuse large B-cell lymphoma, lymph nodes of multiple sites: Secondary | ICD-10-CM | POA: Diagnosis not present

## 2023-02-22 DIAGNOSIS — I6340 Cerebral infarction due to embolism of unspecified cerebral artery: Secondary | ICD-10-CM | POA: Diagnosis not present

## 2023-02-22 DIAGNOSIS — I2510 Atherosclerotic heart disease of native coronary artery without angina pectoris: Secondary | ICD-10-CM | POA: Diagnosis present

## 2023-02-22 DIAGNOSIS — F0390 Unspecified dementia without behavioral disturbance: Secondary | ICD-10-CM | POA: Diagnosis present

## 2023-02-22 DIAGNOSIS — Z91040 Latex allergy status: Secondary | ICD-10-CM

## 2023-02-22 DIAGNOSIS — Z974 Presence of external hearing-aid: Secondary | ICD-10-CM

## 2023-02-22 DIAGNOSIS — M25562 Pain in left knee: Secondary | ICD-10-CM | POA: Diagnosis present

## 2023-02-22 DIAGNOSIS — E785 Hyperlipidemia, unspecified: Secondary | ICD-10-CM | POA: Diagnosis present

## 2023-02-22 DIAGNOSIS — I130 Hypertensive heart and chronic kidney disease with heart failure and stage 1 through stage 4 chronic kidney disease, or unspecified chronic kidney disease: Secondary | ICD-10-CM | POA: Diagnosis present

## 2023-02-22 DIAGNOSIS — Z9981 Dependence on supplemental oxygen: Secondary | ICD-10-CM

## 2023-02-22 DIAGNOSIS — J029 Acute pharyngitis, unspecified: Secondary | ICD-10-CM | POA: Diagnosis present

## 2023-02-22 DIAGNOSIS — Z8546 Personal history of malignant neoplasm of prostate: Secondary | ICD-10-CM | POA: Diagnosis not present

## 2023-02-22 DIAGNOSIS — R32 Unspecified urinary incontinence: Secondary | ICD-10-CM | POA: Diagnosis present

## 2023-02-22 DIAGNOSIS — D649 Anemia, unspecified: Secondary | ICD-10-CM | POA: Diagnosis present

## 2023-02-22 DIAGNOSIS — I10 Essential (primary) hypertension: Secondary | ICD-10-CM | POA: Diagnosis present

## 2023-02-22 DIAGNOSIS — I63312 Cerebral infarction due to thrombosis of left middle cerebral artery: Secondary | ICD-10-CM | POA: Diagnosis not present

## 2023-02-22 DIAGNOSIS — F01B4 Vascular dementia, moderate, with anxiety: Secondary | ICD-10-CM | POA: Diagnosis not present

## 2023-02-22 DIAGNOSIS — I69322 Dysarthria following cerebral infarction: Secondary | ICD-10-CM

## 2023-02-22 DIAGNOSIS — C8511 Unspecified B-cell lymphoma, lymph nodes of head, face, and neck: Secondary | ICD-10-CM | POA: Diagnosis not present

## 2023-02-22 DIAGNOSIS — Z79899 Other long term (current) drug therapy: Secondary | ICD-10-CM

## 2023-02-22 DIAGNOSIS — I69392 Facial weakness following cerebral infarction: Secondary | ICD-10-CM

## 2023-02-22 DIAGNOSIS — Z96641 Presence of right artificial hip joint: Secondary | ICD-10-CM | POA: Diagnosis present

## 2023-02-22 DIAGNOSIS — Z888 Allergy status to other drugs, medicaments and biological substances status: Secondary | ICD-10-CM

## 2023-02-22 DIAGNOSIS — Z955 Presence of coronary angioplasty implant and graft: Secondary | ICD-10-CM

## 2023-02-22 DIAGNOSIS — E538 Deficiency of other specified B group vitamins: Secondary | ICD-10-CM | POA: Diagnosis present

## 2023-02-22 DIAGNOSIS — Z91048 Other nonmedicinal substance allergy status: Secondary | ICD-10-CM

## 2023-02-22 DIAGNOSIS — Z7189 Other specified counseling: Secondary | ICD-10-CM | POA: Diagnosis not present

## 2023-02-22 DIAGNOSIS — Z800 Family history of malignant neoplasm of digestive organs: Secondary | ICD-10-CM

## 2023-02-22 DIAGNOSIS — M1611 Unilateral primary osteoarthritis, right hip: Secondary | ICD-10-CM | POA: Diagnosis present

## 2023-02-22 DIAGNOSIS — I509 Heart failure, unspecified: Secondary | ICD-10-CM | POA: Diagnosis not present

## 2023-02-22 DIAGNOSIS — Z515 Encounter for palliative care: Secondary | ICD-10-CM

## 2023-02-22 DIAGNOSIS — N179 Acute kidney failure, unspecified: Secondary | ICD-10-CM | POA: Diagnosis present

## 2023-02-22 HISTORY — DX: Depression, unspecified: F32.A

## 2023-02-22 HISTORY — DX: Malignant neoplasm of prostate: C61

## 2023-02-22 MED ORDER — ASPIRIN 81 MG PO TBEC: 81.0000 mg | DELAYED_RELEASE_TABLET | Freq: Every day | ORAL | Status: DC

## 2023-02-22 MED ORDER — PROCHLORPERAZINE MALEATE 5 MG PO TABS
5.0000 mg | ORAL_TABLET | Freq: Four times a day (QID) | ORAL | Status: DC | PRN
  Filled 2023-02-22: qty 2

## 2023-02-22 MED ORDER — VITAMIN B-12 1000 MCG PO TABS
2500.0000 ug | ORAL_TABLET | Freq: Every day | ORAL | Status: DC
  Administered 2023-02-23 – 2023-03-09 (×15): 2500 ug via ORAL
  Filled 2023-02-22 (×15): qty 3

## 2023-02-22 MED ORDER — ADULT MULTIVITAMIN W/MINERALS CH
1.0000 | ORAL_TABLET | Freq: Every day | ORAL | Status: DC
  Administered 2023-02-23 – 2023-03-09 (×15): 1 via ORAL
  Filled 2023-02-22 (×15): qty 1

## 2023-02-22 MED ORDER — DIPHENHYDRAMINE HCL 25 MG PO CAPS
25.0000 mg | ORAL_CAPSULE | Freq: Four times a day (QID) | ORAL | Status: DC | PRN
  Administered 2023-03-01 – 2023-03-02 (×2): 25 mg via ORAL
  Filled 2023-02-22 (×2): qty 1

## 2023-02-22 MED ORDER — ENSURE ENLIVE PO LIQD
237.0000 mL | Freq: Two times a day (BID) | ORAL | Status: DC
  Administered 2023-02-22 – 2023-03-07 (×24): 237 mL via ORAL

## 2023-02-22 MED ORDER — LIDOCAINE HCL URETHRAL/MUCOSAL 2 % EX GEL
CUTANEOUS | Status: DC | PRN
  Filled 2023-02-22: qty 6

## 2023-02-22 MED ORDER — POLYETHYLENE GLYCOL 3350 17 G PO PACK
17.0000 g | PACK | Freq: Every day | ORAL | Status: DC
  Administered 2023-02-23 – 2023-03-09 (×14): 17 g via ORAL
  Filled 2023-02-22 (×15): qty 1

## 2023-02-22 MED ORDER — FENOFIBRATE 160 MG PO TABS
80.0000 mg | ORAL_TABLET | Freq: Every day | ORAL | Status: DC
  Administered 2023-02-23 – 2023-03-08 (×12): 80 mg via ORAL
  Filled 2023-02-22 (×16): qty 0.5

## 2023-02-22 MED ORDER — ALUM & MAG HYDROXIDE-SIMETH 200-200-20 MG/5ML PO SUSP: 30.0000 mL | ORAL | Status: DC | PRN

## 2023-02-22 MED ORDER — SERTRALINE HCL 50 MG PO TABS
25.0000 mg | ORAL_TABLET | Freq: Every day | ORAL | Status: DC
  Administered 2023-02-23 – 2023-03-09 (×15): 25 mg via ORAL
  Filled 2023-02-22 (×15): qty 1

## 2023-02-22 MED ORDER — CLOPIDOGREL BISULFATE 75 MG PO TABS
75.0000 mg | ORAL_TABLET | Freq: Every day | ORAL | Status: DC
  Administered 2023-02-23 – 2023-03-09 (×15): 75 mg via ORAL
  Filled 2023-02-22 (×15): qty 1

## 2023-02-22 MED ORDER — MELATONIN 5 MG PO TABS
5.0000 mg | ORAL_TABLET | Freq: Every evening | ORAL | Status: DC | PRN
  Administered 2023-02-23 – 2023-03-05 (×7): 5 mg via ORAL
  Filled 2023-02-22 (×7): qty 1

## 2023-02-22 MED ORDER — SIMETHICONE 80 MG PO CHEW: 80.0000 mg | CHEWABLE_TABLET | Freq: Four times a day (QID) | ORAL | Status: DC | PRN

## 2023-02-22 MED ORDER — PROCHLORPERAZINE 25 MG RE SUPP: 12.5000 mg | Freq: Four times a day (QID) | RECTAL | Status: DC | PRN

## 2023-02-22 MED ORDER — BISACODYL 10 MG RE SUPP: 10.0000 mg | Freq: Every day | RECTAL | Status: DC | PRN

## 2023-02-22 MED ORDER — POLYSACCHARIDE IRON COMPLEX 150 MG PO CAPS
150.0000 mg | ORAL_CAPSULE | Freq: Every day | ORAL | Status: DC
  Administered 2023-02-23 – 2023-03-09 (×15): 150 mg via ORAL
  Filled 2023-02-22 (×15): qty 1

## 2023-02-22 MED ORDER — GABAPENTIN 100 MG PO CAPS
100.0000 mg | ORAL_CAPSULE | Freq: Three times a day (TID) | ORAL | Status: DC
  Administered 2023-02-22 – 2023-03-07 (×39): 100 mg via ORAL
  Filled 2023-02-22 (×39): qty 1

## 2023-02-22 MED ORDER — METOPROLOL TARTRATE 12.5 MG HALF TABLET
25.0000 mg | ORAL_TABLET | Freq: Every day | ORAL | Status: DC
  Administered 2023-02-23 – 2023-03-09 (×15): 25 mg via ORAL
  Filled 2023-02-22 (×15): qty 2

## 2023-02-22 MED ORDER — LATANOPROST 0.005 % OP SOLN
1.0000 [drp] | Freq: Every day | OPHTHALMIC | Status: DC
  Administered 2023-02-22 – 2023-03-08 (×14): 1 [drp] via OPHTHALMIC
  Filled 2023-02-22: qty 2.5

## 2023-02-22 MED ORDER — FLEET ENEMA RE ENEM: 1.0000 | ENEMA | Freq: Once | RECTAL | Status: DC | PRN

## 2023-02-22 MED ORDER — SPIRONOLACTONE 12.5 MG HALF TABLET
12.5000 mg | ORAL_TABLET | Freq: Every day | ORAL | Status: DC
  Administered 2023-02-23 – 2023-03-09 (×15): 12.5 mg via ORAL
  Filled 2023-02-22 (×15): qty 1

## 2023-02-22 MED ORDER — ENOXAPARIN SODIUM 40 MG/0.4ML IJ SOSY
40.0000 mg | PREFILLED_SYRINGE | INTRAMUSCULAR | Status: DC
  Administered 2023-02-22 – 2023-03-08 (×15): 40 mg via SUBCUTANEOUS
  Filled 2023-02-22 (×15): qty 0.4

## 2023-02-22 MED ORDER — ATORVASTATIN CALCIUM 40 MG PO TABS
40.0000 mg | ORAL_TABLET | Freq: Every day | ORAL | Status: DC
  Administered 2023-02-23 – 2023-03-08 (×14): 40 mg via ORAL
  Filled 2023-02-22 (×13): qty 1

## 2023-02-22 MED ORDER — ASPIRIN 81 MG PO TBEC
81.0000 mg | DELAYED_RELEASE_TABLET | Freq: Every day | ORAL | Status: DC
  Administered 2023-02-23 – 2023-03-09 (×15): 81 mg via ORAL
  Filled 2023-02-22 (×15): qty 1

## 2023-02-22 MED ORDER — PROCHLORPERAZINE EDISYLATE 10 MG/2ML IJ SOLN: 5.0000 mg | Freq: Four times a day (QID) | INTRAMUSCULAR | Status: DC | PRN

## 2023-02-22 MED ORDER — FLUTICASONE PROPIONATE 50 MCG/ACT NA SUSP
1.0000 | Freq: Every day | NASAL | Status: DC
  Administered 2023-02-23 – 2023-03-09 (×15): 1 via NASAL
  Filled 2023-02-22: qty 16

## 2023-02-22 MED ORDER — SALINE SPRAY 0.65 % NA SOLN
1.0000 | Freq: Two times a day (BID) | NASAL | Status: DC
  Administered 2023-02-23 – 2023-03-09 (×18): 1 via NASAL
  Filled 2023-02-22: qty 44

## 2023-02-22 MED ORDER — GUAIFENESIN-DM 100-10 MG/5ML PO SYRP: 5.0000 mL | ORAL_SOLUTION | Freq: Four times a day (QID) | ORAL | Status: DC | PRN

## 2023-02-22 MED ORDER — MEDIHONEY WOUND/BURN DRESSING EX PSTE
1.0000 | PASTE | Freq: Every day | CUTANEOUS | Status: DC
  Administered 2023-02-22 – 2023-03-09 (×16): 1 via TOPICAL
  Filled 2023-02-22: qty 44

## 2023-02-22 MED ORDER — EMPAGLIFLOZIN 10 MG PO TABS
10.0000 mg | ORAL_TABLET | Freq: Every day | ORAL | Status: DC
  Administered 2023-02-23 – 2023-03-09 (×15): 10 mg via ORAL
  Filled 2023-02-22 (×15): qty 1

## 2023-02-22 MED ORDER — OMEGA-3-ACID ETHYL ESTERS 1 G PO CAPS
1.0000 g | ORAL_CAPSULE | Freq: Two times a day (BID) | ORAL | Status: DC
  Administered 2023-02-23 – 2023-02-26 (×7): 1 g via ORAL
  Filled 2023-02-22 (×10): qty 1

## 2023-02-22 MED ORDER — ACETAMINOPHEN 325 MG PO TABS
325.0000 mg | ORAL_TABLET | ORAL | Status: DC | PRN
  Administered 2023-02-22 – 2023-03-02 (×5): 650 mg via ORAL
  Filled 2023-02-22 (×5): qty 2

## 2023-02-22 NOTE — Progress Notes (Signed)
Orthopedic Tech Progress Note Patient Details:  Jeffrey Acevedo [birth year] [id number]  Wrist brace left at bedside Ortho Devices Type of Ortho Device: Wrist splint Ortho Device/Splint Location: RUE Ortho Device/Splint Interventions: [name] 02/22/2023, 2:55 PM

## 2023-02-22 NOTE — H&P (Signed)
Physical Medicine and Rehabilitation Admission H&P    CC: Stroke with functional deficits   HPI: Jeffrey Acevedo is a [name] with history of CAD, HTN, CKD 3a, chronic diastolic CHF, hearing loss, dementia, who was admitted to [facility] on 02/15/23 with reports of right sided weakness and inability hold fork or to feed himself. He was found to have acute on chronic renal failure with SCr 106,  MRI brain done revealing  acute-subacute left temporal as well as left parietal and right parietal lobes. Tele- Neurology recommended loop recorder to rule out A fib as cause of bilateral strokes and to continue home DAPT.   He has had issues with hypoxia since last Sept and has been requiring oxygen with activity. Also reports of right wrist pain with edema that is improving. Lasix was discontinued yesterday. Therapy has been working with patient and he continues to have delay in processing. He requires simple one step commands, has balance deficits with right lean on standing, multimodal cues needed to decrease pusher tendencies, right sided weakness with right wrist pain and tendency for right knee to buckle when standing. He was independent with walker PTA but has had falls in the past month and moved in with daughter recently. CIR recommended due to functional decline.    Pt LBM yesterday Having double vision R>L and "shooting stars".   Incontinent of Bowel and bladder for 2-3 months- started ~ October and SOB requiring intermittent O2 since October as well.  R shoulder hurts-   Review of Systems  Constitutional:  Negative for chills and fever.  HENT:  Positive for hearing loss.   Eyes:  Positive for double vision (as well as flashes for past 3 months).  Respiratory:  Negative for cough and shortness of breath.   Cardiovascular:  Negative for chest pain and leg swelling.  Gastrointestinal:  Positive for constipation and heartburn. Negative for abdominal pain.       Bowel  incontinence/sensory deficits for past couple of months.   Genitourinary:  Positive for frequency and urgency.       Bladder incontinence for the past couple of months  Musculoskeletal:  Positive for neck pain (right neck pain). Negative for back pain.       Right shoulder and wrist pain  Skin:  Negative for rash.  Neurological:  Positive for sensory change, speech change and weakness. Negative for dizziness.  Psychiatric/Behavioral:  Positive for memory loss. The patient has insomnia (gets up frequently at nights).   All other systems reviewed and are negative.    Past Medical History:  Diagnosis Date   Acute on chronic diastolic CHF (congestive heart failure) (HCC) 01/03/2023   Cancer (HCC)    prostate cancer surgery 2011   Chronic diastolic heart failure (HCC)    Coronary artery disease    sees Dr. Erlene Quan 3 mths ago.  Stress test 03/31/2015 in epic   Degenerative joint disease    Right hip   Depression    Hearing loss of both ears    only at present wears the right ear hearing aid   Hyperlipidemia    Hypertension    Prostate cancer [facility])     Past Surgical History:  Procedure Laterality Date   CARDIAC CATHETERIZATION  06/17/2009   Proximal LAD 90% stenosis just beyond previously stented segment with a Multi-Link Vision bare-metal stent   CORONARY ANGIOPLASTY     stents placed in 1998 and 2011    CORONARY PRESSURE/FFR  STUDY N/A 05/02/2021   Procedure: INTRAVASCULAR PRESSURE WIRE/FFR STUDY;  Surgeon: Runell Gess, MD;  Location: [facility] INVASIVE CV LAB;  Service: Cardiovascular;  Laterality: N/A;   CORONARY PRESSURE/FFR STUDY N/A 09/19/2021   Procedure: INTRAVASCULAR PRESSURE WIRE/FFR STUDY;  Surgeon: Runell Gess, MD;  Location: [facility] INVASIVE CV LAB;  Service: Cardiovascular;  Laterality: N/A;  RAMUS   CORONARY STENT INTERVENTION N/A 05/02/2021   Procedure: CORONARY STENT INTERVENTION;  Surgeon: Runell Gess, MD;  Location: [facility] INVASIVE CV LAB;  Service: Cardiovascular;   Laterality: N/A;   CORONARY STENT INTERVENTION N/A 09/19/2021   Procedure: CORONARY STENT INTERVENTION;  Surgeon: Runell Gess, MD;  Location: [facility] INVASIVE CV LAB;  Service: Cardiovascular;  Laterality: N/A;   CORONARY STENT INTERVENTION N/A 12/29/2022   Procedure: CORONARY STENT INTERVENTION;  Surgeon: Kathleene Hazel, MD;  Location: [facility] INVASIVE CV LAB;  Service: Cardiovascular;  Laterality: N/A;   LEFT HEART CATH AND CORONARY ANGIOGRAPHY N/A 05/02/2021   Procedure: LEFT HEART CATH AND CORONARY ANGIOGRAPHY;  Surgeon: Runell Gess, MD;  Location: [facility] INVASIVE CV LAB;  Service: Cardiovascular;  Laterality: N/A;   LEFT HEART CATH AND CORONARY ANGIOGRAPHY N/A 09/19/2021   Procedure: LEFT HEART CATH AND CORONARY ANGIOGRAPHY;  Surgeon: Runell Gess, MD;  Location: [facility] INVASIVE CV LAB;  Service: Cardiovascular;  Laterality: N/A;   NASAL SEPTUM SURGERY     Dr. Richardson Landry @ [facility]  2014   [facility]  2011   RIGHT/LEFT HEART CATH AND CORONARY ANGIOGRAPHY N/A 12/29/2022   Procedure: RIGHT/LEFT HEART CATH AND CORONARY ANGIOGRAPHY;  Surgeon: Kathleene Hazel, MD;  Location: [facility] INVASIVE CV LAB;  Service: Cardiovascular;  Laterality: N/A;   ROBOT ASSISTED LAPAROSCOPIC RADICAL PROSTATECTOMY     TOTAL HIP ARTHROPLASTY Right 04/19/2015   Procedure: RIGHT TOTAL HIP ARTHROPLASTY ANTERIOR APPROACH;  Surgeon: Jodi Geralds, MD;  Location: [facility] OR;  Service: Orthopedics;  Laterality: Right;    Family History  Problem Relation Age of Onset   Hypertension Mother    Heart disease Father    Hypertension Father    Leukemia Father    Pancreatic cancer Brother     Social History: Married. 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  reports that he has never smoked. He has never used smokeless tobacco. He reports that he does not drink alcohol and does not use drugs.   Allergies  Allergen Reactions   Adhesive [Tape] Other (See Comments)     Mainly just redness.   Has been ruled out for latex allegery   Hydrochlorothiazide Other (See Comments)    H/o Pancreatitis, takes in Avalide at home   Latex Other (See Comments)    unknown   Vantin [Cefpodoxime] Rash    No medications prior to admission.      Home: One level home with 5 STE   Functional History: Was independent with walker PTA  Functional Status:  Mobility: Requires mod to max for transfers Max hand over hand assist with multimodal cues for sit to stand transfers Right lean noted with pusher tendency and needs right knee support for instability.     ADL: Mod assist for UB care Max assist for LB care  Cognition:    Physical Exam: There were no vitals taken for this visit. Physical Exam Vitals and nursing note reviewed. Exam conducted with a chaperone present.  Constitutional:      Appearance: He is normal weight.  Comments: Frail appearing elderly male with multiple large nodules (some with central necrosis?) on chest as well as multiple small nodules on BUE, BLE right shoulder (and groin per patient).  Sitting up slightly in bed; very HOH- doesn't have hearing aids- awake, alert, daughter at bedside, NAD  HENT:     Head: Normocephalic and atraumatic.     Comments: Nodule on L side of lips- hard to assess for facial droop with nodule.  Tongue mildly deviated to R Sensation decreased on L side of face, not R    Right Ear: External ear normal.     Left Ear: External ear normal.     Nose: Nose normal. No congestion.     Mouth/Throat:     Mouth: Mucous membranes are dry.     Pharynx: Oropharynx is clear. No oropharyngeal exudate.  Eyes:     Comments: Dysconjugate gaze- cannot look L- R gaze preference No nystagmus seen  Cardiovascular:     Rate and Rhythm: Normal rate and regular rhythm.     Heart sounds: Normal heart sounds. No murmur heard.    No gallop.  Pulmonary:     Effort: Pulmonary effort is normal. No respiratory distress.      Breath sounds: Normal breath sounds. No wheezing or rales.     Comments: Decreased at bases B/L  Abdominal:     General: Bowel sounds are normal. There is no distension.     Palpations: Abdomen is soft.     Tenderness: There is no abdominal tenderness.  Musculoskeletal:     Cervical back: Neck supple. No tenderness.     Comments: RUE- deltoid 2+/5; 4-/5 biceps, and triceps; and 4/5 in WE, grip and FA LUE- deltoids 4/5; otherwise 4+/5 in LUE RLE- HF/KE/KF/DF and PF 4/5 LLE- 4+/5 in same muscles   Skin:    General: Skin is warm and dry.     Comments: Diffuse ecchymosis right wrist as well as ecchymotic area right elbow. Pain right biceps with palpation (multiple small SQ nodules felt) and right wrist with extension.  Palpable nodules under tissue on chest, shoulders, groin; lower abdomen; upper arms; and thighs B/L NO IV  Neurological:     Mental Status: He is alert and oriented to person, place, and time.     Comments: HOH. Right facial weakness with mild dysarthria. Unable to cross eyes to left field and reports of diplopia. Able to follow simple motor commands and read clock. DTRs 3+ almost 4+ in R knee- but are slightly worse on R than L L inattention/neglect, even though weakness more on R Decreased to light touch on L side LUE/LLE Hoffman's on R and equivocal on L side 2-3 beats clonus on RLE No increased tone as of yet   Psychiatric:     Comments: Quiet, flat- HOH     No results found for this or any previous visit (from the past 48 hour(s)). No results found.    There were no vitals taken for this visit.  Medical Problem List and Plan: 1. 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  Antithrombotics: -DVT/anticoagulation:  Pharmaceutical: Lovenox             -antiplatelet therapy: DAPT 3. Pain Management: Tylenol prn. Gabapentin 100 mg  TID was added on 11/28 4. Mood/Behavior/Sleep:  LCSW to follow for evaluation and support.              -  antipsychotic agents: N/A--monitor for delirium/MS changes with recent stroke/change in environment insetting of baseline dementia. 5. Neuropsych/cognition: This patient may be intermittently capable of making decisions on his own behalf.  --family has noticed some disorientation and have been present 24/7 at [facility]. Unable to do that here  --also [facility] and wears bilateral hearing aids. 6. Skin/Wound Care:  Routine pressure relief pressure.  7. Fluids/Electrolytes/Nutrition: Monitor I/O. Check CMET in am             --Monitor I/O. Lasix was d/c yesterday for ??? 8. CAD/Chronic diastolic CHF: Followed by Dr. Allyson Sabal. HH diet with strict I/O. Daily weights             --Most recent PCI 12/29/2022 followed by admissions for fluid overload --On Jardiance, aldactone, lipitor, metoprolol, DAPT             --was on Lasix 40 mg BID PTA-->d/c on 12/04 for ??. Monitor for signs of overload 9. Chronic respiratory failure w/nocturnal hypoxia: Since Sept--sleep study negative for OSA  --currently oxygen dependent with activity and intermittently during hospitalization --Encourage pulmonary hygiene. Will check CXR to rule out overload.   --Oxygen prn during the day and continue at nights.   10. New diagnosis B-cell lymphoma: Developing multiple nodules for past 2 months  --just received bx reports yesterday-->called derm for path report  --question coagulopathy as cause of stroke. Will check dopplers  Family asking pt not be told of dx just found out 02/21/23.  11. CKD 3a: Baseline SCr 1.4-1.5 per records  --recheck in am 12. Hx of anemia of chronic disease: Iron 34, Ferritin 1,002, Folate 7.9, B 12-524-->per 01/11/23 panel  --continue iron supplement 13. Hx of vitamin B 12 deficiency: On high doses of B12--will check level in am.  14. Chronic left knee pain: Monitor for symptoms with increase in  activity/Right hemipareis 15. Hx major depressive d/o: Continue zoloft.  16. Hx prostate cancer- sounds like could be indolent:Has urgency/nocturia. Toilet every 4 hours. Check PVRs 17. Hx prediabetes: Hgb A 1c- 5.9 few months ago. Monitor fasting BS with serial labs.  18. R shoulder pain- suggest R shoulder xray.    Jacquelynn Cree, PA-C 02/22/2023   I have personally performed a face to face diagnostic evaluation of this patient and formulated the key components of the plan.  Additionally, I have personally reviewed laboratory data, imaging studies, as well as relevant notes and concur with the physician assistant's documentation above.   The patient's status has not changed from the original H&P.  Any changes in documentation from the acute care chart have been noted above.

## 2023-02-22 NOTE — Plan of Care (Signed)
Wound Plan      Wounds present: Pressure Injury 09/19/21 Coccyx Unstageable - Full thickness tissue loss in which the base of the injury is covered by slough   Interventions:  Medihoney to wound daily and cover with foam dressing  Air loss mattress Pressure redistribution cushion in wheelchair Regular diet; eating Ensure twice a day tween meals Encourage magic cups between meals Vitamin B 12 daily  Braden Score: 15 Sensory: 3 Moisture: 3 Activity: 3 Mobility: 3 Nutrition: 2 Friction: 1  Contributors: Margot Ables, RN, WTA Chana Bode, RN, CRRN

## 2023-02-22 NOTE — Progress Notes (Signed)
Inpatient Rehabilitation Admission Medication Review by a Pharmacist  A complete drug regimen review was completed for this patient to identify any potential clinically significant medication issues.  High Risk Drug Classes Is patient taking? Indication by Medication  Antipsychotic Yes, as an intravenous medication Compazine PO/PR/IV - prn nausea/vomiting  Anticoagulant Yes Enoxaparin - VTE ppx  Antibiotic No   Opioid No   Antiplatelet Yes bASA, clopidogrel - CAD  Hypoglycemics/insulin Yes Jardiance - CHF  Vasoactive Medication Yes Lopressor, spironolactone, Jardiance - CHF  Chemotherapy No   Other Yes Atorvastatin, Lovaza, fenofibrate - HLD Benadryl - prn itching Gabapentin - pain Melatonin - prn sleep Maalox - prn indigestion Sertraline - MDD     Type of Medication Issue Identified Description of Issue Recommendation(s)  Drug Interaction(s) (clinically significant)     Duplicate Therapy     Allergy     No Medication Administration End Date     Incorrect Dose     Additional Drug Therapy Needed     Significant med changes from prior encounter (inform family/care partners about these prior to discharge).    Other       Clinically significant medication issues were identified that warrant physician communication and completion of prescribed/recommended actions by midnight of the next day:  No  Name of provider notified for urgent issues identified:   Provider Method of Notification:    Pharmacist comments:   Time spent performing this drug regimen review (minutes):  20   Rexford Maus, PharmD, BCPS 02/22/2023 12:58 PM

## 2023-02-22 NOTE — Progress Notes (Signed)
Patient ID: Jeffrey Acevedo, male   DOB: November 27, 1940, 82 y.o.   MRN: [id number] Met with the patient and daughter Jeffrey Acevedo) to review current situation, rehab schedule, team conference and plan of care. Reviewed secondary risk management including HF, CAD, HTN, HLD and given handouts on medications and dietary modification recommendations. Discussed skin care, and incontinence management.  Reviewed MyChart access; given access code. Wound treatment plan activation; air mattress and dressing to unstageable on coccyx. Patient with multiple nodules on neck and chest and bx. Sites on scapular area. Continue to follow along to address educational needs to facilitate preparation for discharge. Pamelia Hoit

## 2023-02-22 NOTE — Discharge Instructions (Addendum)
Inpatient Rehab Discharge Instructions  Jeffrey Acevedo Discharge date and time:  03/09/23  Activities/Precautions/ Functional Status: Activity: no lifting, driving, or strenuous exercise  Diet: cardiac diet Wound Care: none needed   Functional status:  ___ No restrictions     ___ Walk up steps independently _X__ 24/7 supervision/assistance   ___ Walk up steps with assistance ___ Intermittent supervision/assistance  ___ Bathe/dress independently ___ Walk with walker     __X_ Bathe/dress with assistance ___ Walk Independently    ___ Shower independently ___ Walk with assistance    ___ Shower with assistance _X__ No alcohol     ___ Return to work/school ________   COMMUNITY REFERRALS UPON DISCHARGE:    Home Health:   PT     OT     SNA                   Agency: [facility] Phone: [phone]    Medical Equipment/Items Ordered: Hospital Bed, Tub Transfer Bench, Lift, Bedside Commode, Wheelchair and Half Lap Tray                                                 Agency/Supplier: [employer] [phone]   Special Instructions:     STROKE/TIA DISCHARGE INSTRUCTIONS SMOKING Cigarette smoking nearly doubles your risk of having a stroke & is the single most alterable risk factor  If you smoke or have smoked in the last 12 months, you are advised to quit smoking for your health. Most of the excess cardiovascular risk related to smoking disappears within a year of stopping. Ask you doctor about anti-smoking medications [facility] Quit Line: 1-800-QUIT NOW Free Smoking Cessation Classes [phone]  CHOLESTEROL Know your levels; limit fat & cholesterol in your diet  Lipid Panel     Component Value Date/Time   CHOL 70 01/15/2023 0258   CHOL 103 11/15/2022 0837   TRIG 273 (H) 01/15/2023 0258   HDL <10 (L) 01/15/2023 0258   HDL 24 (L) 11/15/2022 0837   CHOLHDL NOT CALCULATED 01/15/2023 0258   VLDL 55 (H) 01/15/2023 0258   LDLCALC NOT CALCULATED 01/15/2023 0258   LDLCALC 59 11/15/2022 0837      Many patients benefit from treatment even if their cholesterol is at goal. Goal: Total Cholesterol (CHOL) less than 160 Goal:  Triglycerides (TRIG) less than 150 Goal:  HDL greater than 40 Goal:  LDL (LDLCALC) less than 100   BLOOD PRESSURE American Stroke Association blood pressure target is less that 120/80 mm/Hg  Your discharge blood pressure is:  BP: (!) 140/78 Monitor your blood pressure Limit your salt and alcohol intake Many individuals will require more than one medication for high blood pressure  DIABETES (A1c is a blood sugar average for last 3 months) Goal HGBA1c is under 7% (HBGA1c is blood sugar average for last 3 months)  Diabetes: No known diagnosis of diabetes    Lab Results  Component Value Date   HGBA1C 5.5 01/13/2023    Your HGBA1c can be lowered with medications, healthy diet, and exercise. Check your blood sugar as directed by your physician Call your physician if you experience unexplained or low blood sugars.  PHYSICAL ACTIVITY/REHABILITATION Goal is 30 minutes at least 4 days per week  Activity: No driving, Therapies: see above Return to work: N/A Activity decreases your risk of heart attack and stroke and makes  your heart stronger.  It helps control your weight and blood pressure; helps you relax and can improve your mood. Participate in a regular exercise program. Talk with your doctor about the best form of exercise for you (dancing, walking, swimming, cycling).  DIET/WEIGHT Goal is to maintain a healthy weight  Your discharge diet is:  Diet Order             DIET DYS 3 Room service appropriate? Yes; Fluid consistency: Thin  Diet effective now                    liquids Your height is:  Height: 6' (182.9 cm) Your current weight is: Weight:  (UAT) Your Body Mass Index (BMI) is:  BMI (Calculated): 23.02 Following the type of diet specifically designed for you will help prevent another stroke. You are at goal weight range    Your goal Body Mass  Index (BMI) is 19-24. Healthy food habits can help reduce 3 risk factors for stroke:  High cholesterol, hypertension, and excess weight.  RESOURCES Stroke/Support Group:  Call [phone]   STROKE EDUCATION PROVIDED/REVIEWED AND GIVEN TO PATIENT Stroke warning signs and symptoms How to activate emergency medical system (call 911). Medications prescribed at discharge. Need for follow-up after discharge. Personal risk factors for stroke. Pneumonia vaccine given:  Flu vaccine given:  My questions have been answered, the writing is legible, and I understand these instructions.  I will adhere to these goals & educational materials that have been provided to me after my discharge from the hospital.       My questions have been answered and I understand these instructions. I will adhere to these goals and the provided educational materials after my discharge from the hospital.  Patient/Caregiver Signature _______________________________ Date __________  Clinician Signature _______________________________________ Date __________  Please bring this form and your medication list with you to all your follow-up doctor's appointments.

## 2023-02-22 NOTE — PMR Pre-admission (Signed)
PMR Admission Coordinator Pre-Admission Assessment   Patient: Jeffrey Acevedo is an 82 y.o., male MRN: [id number] DOB: 1941/02/08 Height: 6' (1.829 m) Weight: 80.3 kg   Insurance Information HMO:     PPO: Yes     PCP:       IPA:       80/20:       OTHER:  Group [id number] PRIMARY: [employer]       Policy#: [id number]      Subscriber: self CM Name: Tommi Rumps      Phone#: [phone]     Fax#: [phone] Pre-Cert#: [id number] approved on 02/21/23 and is valid for 5 days.  Follow up case manager is Audery Amel at [phone] and fax [phone] for weekly updates.      Employer:  Benefits:  Phone #: [phone]     Name: online at [email or web address] Eff. Date: 03/20/22     Deduct: $0      Out of Pocket Max: $4000 (met $3249.45)      Life Max: n/a CIR: $160/day for 10 days with max $1600/admission      SNF: $0 copay for days 1-20, $50/dah for days 21-100 Outpatient: med nec     Co-Pay: $20/visit Home Health: 100%      Co-Pay: none DME: 80%     Co-Pay: 20% Providers: in network  SECONDARY:        Policy#:        Phone#:     Artist:        Phone#:     The Data processing manager" for patients in Inpatient Rehabilitation Facilities with attached "Privacy Act Statement-Health Care Records" was provided and verbally reviewed with: Patient and Family   Emergency Contact Information Contact Information       Name Relation Home Work Mobile    Wailea Spouse [phone]        Glenmore, Luiz Daughter     [phone]    Nancy Fetter Daughter [phone]   [phone]         Other Contacts   None on File        Current Medical History  Patient Admitting Diagnosis: B CVA   History of Present Illness: An 82 yo male with dementia, CHF, CAD s/p PCI (on DAPT), HTN, and HLD who presented 02/11/23 to [facility] ED with right sided weakness over the preceding 1-2 days.  CT head was negative for acute intracranial pathology.  CTA  head/neck was negative for large vessel occlusion or high-grade stenosis.  TTE showed no signs of embolic source.  LDL 18, A1C 4.8%.  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  PT/OT/SLP evaluations completed with recommendations for acute inpatient rehab admission.   NIH=3   Patient's medical record from [facility] has been reviewed by the rehabilitation admission coordinator and physician.   Past Medical History      Past Medical History:  Diagnosis Date   Acute on chronic diastolic CHF (congestive heart failure) (HCC) 01/03/2023   Cancer (HCC)      prostate cancer surgery 2011   Chronic diastolic heart failure (HCC)     Coronary artery disease      sees Dr. Erlene Quan 3 mths ago.  Stress test 03/31/2015 in epic   Degenerative joint disease      Right hip   Hearing loss of both ears      only at present wears the right ear  hearing aid   Hyperlipidemia     Hypertension            Has the patient had major surgery during 100 days prior to admission? No   Family History   family history includes Heart disease in his father; Hypertension in his father and mother.   Current Medications  Current Medications    Current Outpatient Medications:    aspirin EC 81 MG EC tablet, Take 1 tablet (81 mg total) by mouth daily. Swallow whole. (Patient taking differently: Take 81 mg by mouth every morning. Swallow whole.), Disp: 90 tablet, Rfl: 2   atorvastatin (LIPITOR) 40 MG tablet, Take 1 tablet (40 mg total) by mouth daily. (Patient taking differently: Take 40 mg by mouth every morning.), Disp: 30 tablet, Rfl: 0   Choline Fenofibrate (FENOFIBRIC ACID) 45 MG CPDR, Take 1 capsule by mouth once daily (Patient taking differently: Take 1 tablet by mouth at bedtime.), Disp: 90 capsule, Rfl: 1   clopidogrel (PLAVIX) 75 MG tablet, Take 1 tablet (75 mg total) by mouth daily. (Patient taking differently: Take 75 mg by mouth every  morning.), Disp: 90 tablet, Rfl: 3   Cyanocobalamin (VITAMIN B-12 PO), Take 2,500 mcg by mouth every morning., Disp: , Rfl:    empagliflozin (JARDIANCE) 10 MG TABS tablet, Take 1 tablet (10 mg total) by mouth daily., Disp: 30 tablet, Rfl: 0   ferrous sulfate 325 (65 FE) MG tablet, Take 1 tablet (325 mg total) by mouth daily with breakfast., Disp: 30 tablet, Rfl: 0   fluticasone (FLONASE) 50 MCG/ACT nasal spray, Place 1 spray into both nostrils daily., Disp: , Rfl:    furosemide (LASIX) 40 MG tablet, Take 1 tablet (40 mg total) by mouth 2 (two) times daily., Disp: 60 tablet, Rfl: 0   MAGNESIUM CITRATE PO, Take 400 mg by mouth every morning., Disp: , Rfl:    metoprolol tartrate (LOPRESSOR) 25 MG tablet, Take 1 tablet (25 mg total) by mouth daily., Disp: 90 tablet, Rfl: 3   nitroGLYCERIN (NITROSTAT) 0.4 MG SL tablet, Place 1 tablet (0.4 mg total) under the tongue every 5 (five) minutes as needed for chest pain., Disp: 25 tablet, Rfl: 3   Omega-3 Fatty Acids (FISH OIL) 1000 MG CAPS, Take 1,000 mg by mouth every other day., Disp: , Rfl:    polyethylene glycol powder (GLYCOLAX/MIRALAX) 17 GM/SCOOP powder, Take 17 g by mouth daily. (Patient taking differently: Take 17 g by mouth as needed.), Disp: 238 g, Rfl: 0   sertraline (ZOLOFT) 25 MG tablet, Take 1 tablet (25 mg total) by mouth daily., Disp: 90 tablet, Rfl: 3   spironolactone (ALDACTONE) 25 MG tablet, Take 0.5 tablets (12.5 mg total) by mouth daily., Disp: 15 tablet, Rfl: 0   spironolactone (ALDACTONE) 25 MG tablet, Take 1 tablet (25 mg total) by mouth daily., Disp: 90 tablet, Rfl: 0   triamcinolone cream (KENALOG) 0.1 %, Apply topically to the affected area 2 (two) times daily for 2 to 3 weeks (Patient taking differently: Apply 1 Application topically as needed.), Disp: 454 g, Rfl: 2     Patients Current Diet: Diet Regular diet, thin liquids   Precautions / Restrictions Precautions: Fall Precautions/Special Needs: Swallowing Weight Bearing  Restrictions: No     Has the patient had 2 or more falls or a fall with injury in the past year? Yes   Prior Activity Level Household: Homebound with visits to MD appointment only since 09/24     Prior Functional Level Self Care: Did the  patient need help bathing, dressing, using the toilet or eating? Needed some help   Indoor Mobility: Did the patient need assistance with walking from room to room (with or without device)? Needed some help   Stairs: Did the patient need assistance with internal or external stairs (with or without device)? Dependent   Functional Cognition: Did the patient need help planning regular tasks such as shopping or remembering to take medications? Needed some help   Patient Information Are you of Hispanic, Latino/a,or Spanish origin?: A. No, not of Hispanic, Latino/a, or Spanish origin What is your race?: A. White Do you need or want an interpreter to communicate with a doctor or health care staff?: 0. No   Patient's Response To:  Health Literacy and Transportation Is the patient able to respond to health literacy and transportation needs?: Yes Health Literacy - How often do you need to have someone help you when you read instructions, pamphlets, or other written material from your doctor or pharmacy?: Always In the past 12 months, has lack of transportation kept you from medical appointments or from getting medications?: No In the past 12 months, has lack of transportation kept you from meetings, work, or from getting things needed for daily living?: No   Home Assistive Devices / Research scientist (physical sciences), transport chair, manual wheelchair   Prior Device Use: Indicate devices/aids used by the patient prior to current illness, exacerbation or injury? Manual wheelchair and Walker     Prior Functional Level Current Functional Level  Bed Mobility   Independent Max assist    Transfers   Independent   Mod assist    Mobility - Walk/Wheelchair   Mod  Independent   Mod assist    Upper Body Dressing   Mod Independent   Mod assist    Lower Body Dressing   Min assist   Max assist    Grooming   Independent   Min assist    Eating/Drinking   Independent   Mod assist    Toilet Transfer   Independent   Total assist    Bladder Continence    Continent   Continent    Bowel Management   Continent   Continent    Stair Climbing   Max assist   Total assist    Communication   Verbally intact   Verbally intact, but is forgetful    Memory   Impaired, history of dementia Impaired      Special Needs/ Care Considerations Oxygen 2L/[place] and Behavioral consideration Has h/o dementia   Previous Home Environment (from acute therapy documentation) Living Arrangements: Spouse/significant other  Lives With: Spouse Available Help at Discharge: Family; Personal care attendant; Available 24 hours/day Type of Home: House Home Layout: One level; Multi-level Alternate Level Stairs-Number of Steps: Flight Home Access: Stairs to enter Secretary/administrator of Steps: 5 Bathroom Shower/Tub: Health visitor: Standard Bathroom Accessibility: Yes How Accessible: Accessible via walker Additional Comments: Has a chair lift     Discharge Living Setting Plans for Discharge Living Setting: House; Lives with (comment) (Plans to DC home to Dtrs home with wife.) Type of Home at Discharge: House Discharge Home Layout: One level Discharge Home Access: Stairs to enter Entrance Stairs-Rails: Left Entrance Stairs-Number of Steps: 3-4 steps Discharge Bathroom Shower/Tub: Curtain; Walk-in shower Discharge Bathroom Toilet: Handicapped height Discharge Bathroom Accessibility: Yes How Accessible: Accessible via walker Does the patient have any problems obtaining your medications?: No     Social/Family/Support Systems Patient Roles: Spouse; Parent (Lives with  dtr, SIL, wife, grandchildren) Contact Information: Nancy Fetter -  daughter - [phone] Anticipated Caregiver: Dtrs and caregivers hired to assist Anticipated Caregiver's Contact Information: Selena Batten - daughter - [phone] Ability/Limitations of Caregiver: Dtr Selena Batten works days, wife cannot assist much, has hired caregivers during the day 9 am to 5 pm. Caregiver Availability: 24/7 Discharge Plan Discussed with Primary Caregiver: Yes Is Caregiver In Agreement with Plan?: Yes Does Caregiver/Family have Issues with Lodging/Transportation while Pt is in Rehab?: No     Goals Patient/Family Goal for Rehab: PT/OT/SLP supervision to min assist goals Expected length of stay: 12-14 days Pt/Family Agrees to Admission and willing to participate: Yes Program Orientation Provided & Reviewed with Pt/Caregiver Including Roles  & Responsibilities: Yes     Decrease burden of Care through IP rehab admission: N/A   Possible need for SNF placement upon discharge: Not planned   Patient Condition: I have reviewed medical records from [facility], spoken with CM, and daughter. I discussed via phone for inpatient rehabilitation assessment.  Patient will benefit from ongoing PT, OT, and SLP, can actively participate in 3 hours of therapy a day 5 days of the week, and can make measurable gains during the admission.  Patient will also benefit from the coordinated team approach during an Inpatient Acute Rehabilitation admission.  The patient will receive intensive therapy as well as Rehabilitation physician, nursing, social worker, and care management interventions.  Due to bladder management, bowel management, safety, skin/wound care, disease management, medication administration, pain management, and patient education the patient requires 24 hour a day rehabilitation nursing.  The patient is currently mod to max assist with mobility and basic ADLs.  Discharge setting and therapy post discharge at home with home health is anticipated.  Patient has agreed to participate  in the Acute Inpatient Rehabilitation Program and will admit today.   Preadmission Screen Completed By:  Trish Mage, 02/20/2023 11:55 AM ______________________________________________________________________   Discussed status with Dr. Riley Kill on 02/22/23 at 0945 and received approval for admission today.   Admission Coordinator:  Trish Mage, RN, time 1018/Date 02/22/23    Assessment/Plan: Diagnosis: CVA- L parietal, temporal and R parietal  Does the need for close, 24 hr/day Medical supervision in concert with the patient's rehab needs make it unreasonable for this patient to be served in a less intensive setting? Yes Co-Morbidities requiring supervision/potential complications: CHF, dementia; HTN, HLD; NIHSS 3 Due to bladder management, bowel management, safety, skin/wound care, disease management, medication administration, and patient education, does the patient require 24 hr/day rehab nursing? Yes Does the patient require coordinated care of a physician, rehab nurse, PT, OT, and SLP to address physical and functional deficits in the context of the above medical diagnosis(es)? Yes Addressing deficits in the following areas: balance, endurance, locomotion, strength, transferring, bowel/bladder control, bathing, dressing, feeding, grooming, toileting, cognition, speech, language, and swallowing Can the patient actively participate in an intensive therapy program of at least 3 hrs of therapy 5 days a week? Yes The potential for patient to make measurable gains while on inpatient rehab is good and fair Anticipated functional outcomes upon discharge from inpatient rehab: supervision and min assist PT, supervision and min assist OT, supervision and min assist SLP Estimated rehab length of stay to reach the above functional goals is: 12-14 days Anticipated discharge destination: Home 10. Overall Rehab/Functional Prognosis: good and fair   MD Signature        Revision History

## 2023-02-22 NOTE — Progress Notes (Signed)
Physical Medicine and Rehabilitation Admission H&P     CC: Stroke with functional deficits     HPI: Jeffrey Acevedo is a [name] with history of CAD, HTN, CKD 3a, chronic diastolic CHF, hearing loss, dementia, who was admitted to [facility] on 02/15/23 with reports of right sided weakness and inability hold fork or to feed himself. He was found to have acute on chronic renal failure with SCr 106,  MRI brain done revealing  acute-subacute left temporal as well as left parietal and right parietal lobes. Tele- Neurology recommended loop recorder to rule out A fib as cause of bilateral strokes and to continue home DAPT.    He has had issues with hypoxia since last Sept and has been requiring oxygen with activity. Also reports of right wrist pain with edema that is improving. Lasix was discontinued yesterday. Therapy has been working with patient and he continues to have delay in processing. He requires simple one step commands, has balance deficits with right lean on standing, multimodal cues needed to decrease pusher tendencies, right sided weakness with right wrist pain and tendency for right knee to buckle when standing. He was independent with walker PTA but has had falls in the past month and moved in with daughter recently. CIR recommended due to functional decline.     Pt LBM yesterday Having double vision R>L and "shooting stars".   Incontinent of Bowel and bladder for 2-3 months- started ~ October and SOB requiring intermittent O2 since October as well.  R shoulder hurts-     Review of Systems  Constitutional:  Negative for chills and fever.  HENT:  Positive for hearing loss.   Eyes:  Positive for double vision (as well as flashes for past 3 months).  Respiratory:  Negative for cough and shortness of breath.   Cardiovascular:  Negative for chest pain and leg swelling.  Gastrointestinal:  Positive for constipation and heartburn. Negative for abdominal pain.       Bowel  incontinence/sensory deficits for past couple of months.   Genitourinary:  Positive for frequency and urgency.       Bladder incontinence for the past couple of months  Musculoskeletal:  Positive for neck pain (right neck pain). Negative for back pain.       Right shoulder and wrist pain  Skin:  Negative for rash.  Neurological:  Positive for sensory change, speech change and weakness. Negative for dizziness.  Psychiatric/Behavioral:  Positive for memory loss. The patient has insomnia (gets up frequently at nights).   All other systems reviewed and are negative.           Past Medical History:  Diagnosis Date   Acute on chronic diastolic CHF (congestive heart failure) (HCC) 01/03/2023   Cancer (HCC)      prostate cancer surgery 2011   Chronic diastolic heart failure (HCC)     Coronary artery disease      sees Dr. Erlene Quan 3 mths ago.  Stress test 03/31/2015 in epic   Degenerative joint disease      Right hip   Depression     Hearing loss of both ears      only at present wears the right ear hearing aid   Hyperlipidemia     Hypertension     Prostate cancer [facility])                 Past Surgical History:  Procedure Laterality Date  CARDIAC CATHETERIZATION   06/17/2009    Proximal LAD 90% stenosis just beyond previously stented segment with a Multi-Link Vision bare-metal stent   CORONARY ANGIOPLASTY        stents placed in 1998 and 2011    CORONARY PRESSURE/FFR STUDY N/A 05/02/2021    Procedure: INTRAVASCULAR PRESSURE WIRE/FFR STUDY;  Surgeon: Runell Gess, MD;  Location: [facility];  Service: Cardiovascular;  Laterality: N/A;   CORONARY PRESSURE/FFR STUDY N/A 09/19/2021    Procedure: INTRAVASCULAR PRESSURE WIRE/FFR STUDY;  Surgeon: Runell Gess, MD;  Location: [facility];  Service: Cardiovascular;  Laterality: N/A;  RAMUS   CORONARY STENT INTERVENTION N/A 05/02/2021    Procedure: CORONARY STENT INTERVENTION;  Surgeon: Runell Gess, MD;  Location: [facility]  INV[facility] CV [facility];  Service: Cardiovascular;  Laterality: N/A;   CORONARY STENT INTERVENTION N/A 09/19/2021    Procedure: CORONARY STENT INTERVENTION;  Surgeon: Runell Gess, MD;  Location: [facility];  Service: Cardiovascular;  Laterality: N/A;   CORONARY STENT INTERVENTION N/A 12/29/2022    Procedure: CORONARY STENT INTERVENTION;  Surgeon: Kathleene Hazel, MD;  Location: [facility];  Service: Cardiovascular;  Laterality: N/A;   LEFT HEART CATH AND CORONARY ANGIOGRAPHY N/A 05/02/2021    Procedure: LEFT HEART CATH AND CORONARY ANGIOGRAPHY;  Surgeon: Runell Gess, MD;  Location: [facility];  Service: Cardiovascular;  Laterality: N/A;   LEFT HEART CATH AND CORONARY ANGIOGRAPHY N/A 09/19/2021    Procedure: LEFT HEART CATH AND CORONARY ANGIOGRAPHY;  Surgeon: Runell Gess, MD;  Location: [facility];  Service: Cardiovascular;  Laterality: N/A;   NASAL SEPTUM SURGERY        Dr. Richardson Landry @ [facility]  2014   [facility]   2011   RIGHT/LEFT HEART CATH AND CORONARY ANGIOGRAPHY N/A 12/29/2022    Procedure: RIGHT/LEFT HEART CATH AND CORONARY ANGIOGRAPHY;  Surgeon: Kathleene Hazel, MD;  Location: [facility];  Service: Cardiovascular;  Laterality: N/A;   ROBOT ASSISTED LAPAROSCOPIC RADICAL PROSTATECTOMY       TOTAL HIP ARTHROPLASTY Right 04/19/2015    Procedure: RIGHT TOTAL HIP ARTHROPLASTY ANTERIOR APPROACH;  Surgeon: Jodi Geralds, MD;  Location: [facility] OR;  Service: Orthopedics;  Laterality: Right;               Family History  Problem Relation Age of Onset   Hypertension Mother     Heart disease Father     Hypertension Father     Leukemia Father     Pancreatic cancer Brother            Social History: Married. 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  reports that he has never smoked. He has never used smokeless tobacco. He reports that he does not drink alcohol and does not use  drugs.     Allergies       Allergies  Allergen Reactions   Adhesive [Tape] Other (See Comments)      Mainly just redness.   Has been ruled out for latex allegery   Hydrochlorothiazide Other (See Comments)      H/o Pancreatitis, takes in Avalide at home   Latex Other (See Comments)      unknown   Vantin [Cefpodoxime] Rash        No medications prior to admission.  Home: One level home with 5 STE   Functional History: Was independent with walker PTA   Functional Status:  Mobility: Requires mod to max for transfers Max hand over hand assist with multimodal cues for sit to stand transfers Right lean noted with pusher tendency and needs right knee support for instability.    ADL: Mod assist for UB care Max assist for LB care   Cognition:       Physical Exam: There were no vitals taken for this visit. Physical Exam Vitals and nursing note reviewed. Exam conducted with a chaperone present.  Constitutional:      Appearance: He is normal weight.     Comments: Frail appearing elderly male with multiple large nodules (some with central necrosis?) on chest as well as multiple small nodules on BUE, BLE right shoulder (and groin per patient).  Sitting up slightly in bed; very HOH- doesn't have hearing aids- awake, alert, daughter at bedside, NAD  HENT:     Head: Normocephalic and atraumatic.     Comments: Nodule on L side of lips- hard to assess for facial droop with nodule.  Tongue mildly deviated to R Sensation decreased on L side of face, not R    Right Ear: External ear normal.     Left Ear: External ear normal.     Nose: Nose normal. No congestion.     Mouth/Throat:     Mouth: Mucous membranes are dry.     Pharynx: Oropharynx is clear. No oropharyngeal exudate.  Eyes:     Comments: Dysconjugate gaze- cannot look L- R gaze preference No nystagmus seen  Cardiovascular:     Rate and Rhythm: Normal rate and regular rhythm.     Heart sounds: Normal  heart sounds. No murmur heard.    No gallop.  Pulmonary:     Effort: Pulmonary effort is normal. No respiratory distress.     Breath sounds: Normal breath sounds. No wheezing or rales.     Comments: Decreased at bases B/L  Abdominal:     General: Bowel sounds are normal. There is no distension.     Palpations: Abdomen is soft.     Tenderness: There is no abdominal tenderness.  Musculoskeletal:     Cervical back: Neck supple. No tenderness.     Comments: RUE- deltoid 2+/5; 4-/5 biceps, and triceps; and 4/5 in WE, grip and FA LUE- deltoids 4/5; otherwise 4+/5 in LUE RLE- HF/KE/KF/DF and PF 4/5 LLE- 4+/5 in same muscles   Skin:    General: Skin is warm and dry.     Comments: Diffuse ecchymosis right wrist as well as ecchymotic area right elbow. Pain right biceps with palpation (multiple small SQ nodules felt) and right wrist with extension.  Palpable nodules under tissue on chest, shoulders, groin; lower abdomen; upper arms; and thighs B/L NO IV  Neurological:     Mental Status: He is alert and oriented to person, place, and time.     Comments: HOH. Right facial weakness with mild dysarthria. Unable to cross eyes to left field and reports of diplopia. Able to follow simple motor commands and read clock. DTRs 3+ almost 4+ in R knee- but are slightly worse on R than L L inattention/neglect, even though weakness more on R Decreased to light touch on L side LUE/LLE Hoffman's on R and equivocal on L side 2-3 beats clonus on RLE No increased tone as of yet   Psychiatric:     Comments: Quiet, flat- HOH  Lab Results Last 48 Hours  No results found for this or any previous visit (from the past 48 hour(s)).   Imaging Results (Last 48 hours)  No results found.         There were no vitals taken for this visit.   Medical Problem List and Plan: 1. 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  Antithrombotics: -DVT/anticoagulation:  Pharmaceutical: Lovenox             -antiplatelet therapy: DAPT 3. Pain Management: Tylenol prn. Gabapentin 100 mg TID was added on 11/28 4. Mood/Behavior/Sleep:  LCSW to follow for evaluation and support.              -antipsychotic agents: N/A--monitor for delirium/MS changes with recent stroke/change in environment insetting of baseline dementia. 5. Neuropsych/cognition: This patient may be intermittently capable of making decisions on his own behalf.             --family has noticed some disorientation and have been present 24/7 at [facility]. Unable to do that here             --also [facility] and wears bilateral hearing aids. 6. Skin/Wound Care:  Routine pressure relief pressure.  7. Fluids/Electrolytes/Nutrition: Monitor I/O. Check CMET in am             --Monitor I/O. Lasix was d/c yesterday for ??? 8. CAD/Chronic diastolic CHF: Followed by Dr. Allyson Sabal. HH diet with strict I/O. Daily weights             --Most recent PCI 12/29/2022 followed by admissions for fluid overload --On Jardiance, aldactone, lipitor, metoprolol, DAPT             --was on Lasix 40 mg BID PTA-->d/c on 12/04 for ??. Monitor for signs of overload 9. Chronic respiratory failure w/nocturnal hypoxia: Since Sept--sleep study negative for OSA             --currently oxygen dependent with activity and intermittently during hospitalization --Encourage pulmonary hygiene. Will check CXR to rule out overload.              --Oxygen prn during the day and continue at nights.   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 Will check dopplers             Family asking pt not be told of dx just found out 02/21/23.  11. CKD 3a: Baseline SCr 1.4-1.5 per records              --recheck in am 12. Hx of anemia of chronic disease: Iron 34, Ferritin 1,002, Folate 7.9, B 12-524-->per 01/11/23 panel             --continue iron supplement 13. Hx of vitamin B 12 deficiency: On high doses of B12--will check level in am.  14. Chronic left knee pain: Monitor for symptoms with increase in activity/Right hemipareis 15. Hx major depressive d/o:  Continue zoloft.  16. Hx prostate cancer- sounds like could be indolent:Has urgency/nocturia. Toilet every 4 hours. Check PVRs 17. Hx prediabetes: Hgb A 1c- 5.9 few months ago. Monitor fasting BS with serial labs.  18. R shoulder pain- suggest R shoulder xray.      Jacquelynn Cree, PA-C 02/22/2023     I have personally performed a face to face diagnostic evaluation of this patient and formulated the key components of the plan.  Additionally, I have personally reviewed laboratory data, imaging studies, as well as relevant notes and concur with the physician assistant's documentation above.   The patient's status has not changed from the original H&P.  Any changes in documentation from the acute care chart have been noted above.

## 2023-02-23 ENCOUNTER — Inpatient Hospital Stay (HOSPITAL_COMMUNITY): Payer: Medicare PPO

## 2023-02-23 DIAGNOSIS — C8338 Diffuse large B-cell lymphoma, lymph nodes of multiple sites: Secondary | ICD-10-CM | POA: Diagnosis not present

## 2023-02-23 DIAGNOSIS — F01B4 Vascular dementia, moderate, with anxiety: Secondary | ICD-10-CM | POA: Diagnosis not present

## 2023-02-23 DIAGNOSIS — I509 Heart failure, unspecified: Secondary | ICD-10-CM

## 2023-02-23 DIAGNOSIS — I639 Cerebral infarction, unspecified: Secondary | ICD-10-CM

## 2023-02-23 LAB — CBC WITH DIFFERENTIAL/PLATELET
Abs Immature Granulocytes: 0.08 10*3/uL — ABNORMAL HIGH (ref 0.00–0.07)
Basophils Absolute: 0.1 10*3/uL (ref 0.0–0.1)
Basophils Relative: 1 %
Eosinophils Absolute: 0.5 10*3/uL (ref 0.0–0.5)
Eosinophils Relative: 6 %
HCT: 37.5 % — ABNORMAL LOW (ref 39.0–52.0)
Hemoglobin: 11.9 g/dL — ABNORMAL LOW (ref 13.0–17.0)
Immature Granulocytes: 1 %
Lymphocytes Relative: 10 %
Lymphs Abs: 0.8 10*3/uL (ref 0.7–4.0)
MCH: 26.4 pg (ref 26.0–34.0)
MCHC: 31.7 g/dL (ref 30.0–36.0)
MCV: 83.3 fL (ref 80.0–100.0)
Monocytes Absolute: 1 10*3/uL (ref 0.1–1.0)
Monocytes Relative: 12 %
Neutro Abs: 5.9 10*3/uL (ref 1.7–7.7)
Neutrophils Relative %: 70 %
Platelets: 337 10*3/uL (ref 150–400)
RBC: 4.5 MIL/uL (ref 4.22–5.81)
RDW: 16.2 % — ABNORMAL HIGH (ref 11.5–15.5)
WBC: 8.4 10*3/uL (ref 4.0–10.5)
nRBC: 0 % (ref 0.0–0.2)

## 2023-02-23 LAB — COMPREHENSIVE METABOLIC PANEL
ALT: 27 U/L (ref 0–44)
AST: 25 U/L (ref 15–41)
Albumin: 3 g/dL — ABNORMAL LOW (ref 3.5–5.0)
Alkaline Phosphatase: 45 U/L (ref 38–126)
Anion gap: 10 (ref 5–15)
BUN: 22 mg/dL (ref 8–23)
CO2: 22 mmol/L (ref 22–32)
Calcium: 9.4 mg/dL (ref 8.9–10.3)
Chloride: 100 mmol/L (ref 98–111)
Creatinine, Ser: 1.12 mg/dL (ref 0.61–1.24)
GFR, Estimated: >60 mL/min (ref >60)
Glucose, Bld: 96 mg/dL (ref 70–99)
Potassium: 3.8 mmol/L (ref 3.5–5.1)
Sodium: 132 mmol/L — ABNORMAL LOW (ref 135–145)
Total Bilirubin: 0.8 mg/dL (ref <1.2)
Total Protein: 6.4 g/dL — ABNORMAL LOW (ref 6.5–8.1)

## 2023-02-23 LAB — VITAMIN B12: Vitamin B-12: 693 pg/mL (ref 180–914)

## 2023-02-23 NOTE — Evaluation (Signed)
Physical Therapy Assessment and Plan  Patient Details  Name: Jeffrey Acevedo MRN: [id number] Date of Birth: 04-01-1940  PT Diagnosis: Abnormal posture, Abnormality of gait, Coordination disorder, Difficulty walking, Hemiparesis dominant, Impaired cognition, and Muscle weakness Rehab Potential: Good ELOS: 3 weeks.   Today's Date: 02/23/2023 PT Individual Time: 4098-1191 PT Individual Time Calculation (min): 73 min    Hospital Problem: Principal Problem:   Stroke (cerebrum) [facility])   Past Medical History:  Past Medical History:  Diagnosis Date   Acute on chronic diastolic CHF (congestive heart failure) (HCC) 01/03/2023   Cancer (HCC)    prostate cancer surgery 2011   Chronic diastolic heart failure (HCC)    Coronary artery disease    sees Dr. Erlene Acevedo 3 mths ago.  Stress test 03/31/2015 in epic   Degenerative joint disease    Right hip   Depression    Hearing loss of both ears    only at present wears the right ear hearing aid   Hyperlipidemia    Hypertension    Prostate cancer [facility])    Past Surgical History:  Past Surgical History:  Procedure Laterality Date   CARDIAC CATHETERIZATION  06/17/2009   Proximal LAD 90% stenosis just beyond previously stented segment with a Multi-Link Vision bare-metal stent   CORONARY ANGIOPLASTY     stents placed in 1998 and 2011    CORONARY PRESSURE/FFR STUDY N/A 05/02/2021   Procedure: INTRAVASCULAR PRESSURE WIRE/FFR STUDY;  Surgeon: Runell Gess, MD;  Location: [facility];  Service: Cardiovascular;  Laterality: N/A;   CORONARY PRESSURE/FFR STUDY N/A 09/19/2021   Procedure: INTRAVASCULAR PRESSURE WIRE/FFR STUDY;  Surgeon: Runell Gess, MD;  Location: [facility];  Service: Cardiovascular;  Laterality: N/A;  RAMUS   CORONARY STENT INTERVENTION N/A 05/02/2021   Procedure: CORONARY STENT INTERVENTION;  Surgeon: Runell Gess, MD;  Location: [facility];  Service: Cardiovascular;  Laterality: N/A;   CORONARY STENT  INTERVENTION N/A 09/19/2021   Procedure: CORONARY STENT INTERVENTION;  Surgeon: Runell Gess, MD;  [facility]: [facility];  Service: Cardiovascular;  Laterality: N/A;   CORONARY STENT INTERVENTION N/A 12/29/2022   Procedure: CORONARY STENT INTERVENTION;  Surgeon: Kathleene Hazel, MD;  [facility]: [facility];  Service: Cardiovascular;  Laterality: N/A;   LEFT HEART CATH AND CORONARY ANGIOGRAPHY N/A 05/02/2021   Procedure: LEFT HEART CATH AND CORONARY ANGIOGRAPHY;  Surgeon: Runell Gess, MD;  [facility];  Service: Cardiovascular;  Laterality: N/A;   LEFT HEART CATH AND CORONARY ANGIOGRAPHY N/A 09/19/2021   Procedure: LEFT HEART CATH AND CORONARY ANGIOGRAPHY;  Surgeon: Runell Gess, MD;  [facility];  Service: Cardiovascular;  Laterality: N/A;   NASAL SEPTUM SURGERY     Dr. Richardson Acevedo @ [facility]  2014   [facility]  2011   RIGHT/LEFT HEART CATH AND CORONARY ANGIOGRAPHY N/A 12/29/2022   Procedure: RIGHT/LEFT HEART CATH AND CORONARY ANGIOGRAPHY;  Surgeon: Kathleene Hazel, MD;  [facility];  Service: Cardiovascular;  Laterality: N/A;   ROBOT ASSISTED LAPAROSCOPIC RADICAL PROSTATECTOMY     TOTAL HIP ARTHROPLASTY Right 04/19/2015   Procedure: RIGHT TOTAL HIP ARTHROPLASTY ANTERIOR APPROACH;  Surgeon: Jodi Geralds, MD;  Location: [facility];  Service: Orthopedics;  Laterality: Right;    Assessment & Plan Clinical Impression: Jeffrey Acevedo. Jeffrey Acevedo is a [name] with history of CAD, HTN, CKD 3a, chronic diastolic CHF, hearing loss,  dementia, who was admitted to [facility] on 02/15/23 with reports of right sided weakness and inability hold fork or to feed himself. He was found to have acute on chronic renal failure with SCr 106,  MRI brain done revealing  acute-subacute left temporal as well as left parietal and right parietal lobes. Tele- Neurology recommended loop recorder to rule out A fib as cause of bilateral  strokes and to continue home DAPT.    He has had issues with hypoxia since last Sept and has been requiring oxygen with activity. Also reports of right wrist pain with edema that is improving. Lasix was discontinued yesterday. Therapy has been working with patient and he continues to have delay in processing. He requires simple one step commands, has balance deficits with right lean on standing, multimodal cues needed to decrease pusher tendencies, right sided weakness with right wrist pain and tendency for right knee to buckle when standing. He was independent with walker PTA but has had falls in the past month and moved in with daughter recently. CIR recommended due to functional decline.  Patient currently requires total with mobility secondary to muscle weakness, abnormal tone, decreased coordination, and decreased motor planning, and decreased awareness, decreased problem solving, and decreased safety awareness.  Prior to hospitalization, patient was min with mobility and lived with Spouse in a House home.  Home access is unable to stateStairs to enter.  Patient will benefit from skilled PT intervention to maximize safe functional mobility, minimize fall risk, and decrease caregiver burden for planned discharge home with 24 hour assist.  Anticipate patient will benefit from follow up [facility] at discharge.  PT - End of Session Activity Tolerance: Tolerates 10 - 20 min activity with multiple rests Endurance Deficit: Yes PT Assessment Rehab Potential (ACUTE/IP ONLY): Good PT Barriers to Discharge: Inaccessible home environment;Home environment access/layout PT Barriers to Discharge Comments: access to home, may be going to dtr's home at D/C PT Patient demonstrates impairments in the following area(s): Balance;Perception;Safety;Endurance;Motor PT Transfers Functional Problem(s): Bed Mobility;Bed to Chair;Car;Furniture PT Locomotion Functional Problem(s): Ambulation;Wheelchair Mobility;Stairs PT Plan PT  Intensity: Minimum of 1-2 x/day ,45 to 90 minutes PT Frequency: 5 out of 7 days PT Duration Estimated Length of Stay: 3 weeks. PT Treatment/Interventions: Ambulation/gait training;Community reintegration;Neuromuscular re-education;Stair training;UE/LE Strength taining/ROM;Wheelchair propulsion/positioning;Discharge planning;Balance/vestibular training;Therapeutic Activities;UE/LE Coordination activities;Therapeutic Exercise;Patient/family education;Functional mobility training PT Transfers Anticipated Outcome(s): Min A bed mobility to sitting, Mod A standing and SPT. PT Locomotion Anticipated Outcome(s): Mod A w/ LRAD PT Recommendation Follow Up Recommendations: Home health PT Patient destination: Home Equipment Recommended: To be determined   PT Evaluation Precautions/Restrictions Precautions Precautions: Fall;Other (comment) Precaution Comments: strong posterior and pusher Restrictions Weight Bearing Restrictions: No General Chart Reviewed: Yes Family/Caregiver Present: No Vital SignsTherapy Vitals Temp: 98.2 F (36.8 C) Resp: 18 BP: 110/61 Oxygen Therapy SpO2: 95 % Pain Pain Assessment Pain Scale: 0-10 Pain Score: 0-No pain Pain Interference Pain Interference Pain Effect on Sleep: 0. Does not apply - I have not had any pain or hurting in the past 5 days Pain Interference with Therapy Activities: 0. Does not apply - I have not received rehabilitationtherapy in the past 5 days Pain Interference with Day-to-Day Activities: 1. Rarely or not at all Home Living/Prior Functioning Home Living Living Arrangements: Spouse/significant other;Children;Other (Comment) Available Help at Discharge: Family;Other (Comment) (PCA 9-5? per chart) Type of Home: House Home Access: Stairs to enter [employer] of Steps: unable to state Entrance Stairs-Rails: Right Home Layout: One level;Multi-level Alternate Level Stairs-Number of Steps: 13  stated by patient Bathroom Shower/Tub:  Walk-in Stage manager: Standard Additional Comments: Has a chair lift; family not present on eval. Information taken from preadmission note.  Lives With: Spouse Prior Function Level of Independence: Needs assistance with gait;Needs assistance with tranfers  Able to Take Stairs?:  (unknown, but pt does state not going upstairs.) Driving: No Vocation: Retired Optometrist - History Ability to See in Adequate Light: 1 Impaired Vision - Assessment Additional Comments: unable to formally assess due to difficulty following directions.  pt may have a right visual field cut based on observation Perception Perception: Impaired Preception Impairment Details: Inattention/Neglect;Body Scheme;Spatial orientation Perception-Other Comments: pt commented " I just cant get my left and right hands to work together." he demonstrated spatial deficits with reaching when using L hand Praxis Praxis: Impaired Praxis Impairment Details: Motor planning;Limb apraxia Praxis-Other Comments: pt has active movement of RUE (fair grasp and sh AROM to 100 but had great difficulty integrating it into function),  LUE overreached and demonstrated incoordination  Cognition Overall Cognitive Status: History of cognitive impairments - at baseline Arousal/Alertness: Awake/alert Orientation Level: Oriented to person;Disoriented to place;Disoriented to time Year: Other (Comment) (2007) Month: January Day of Week: Incorrect Attention: Focused Focused Attention: Impaired Focused Attention Impairment: Verbal basic Memory: Impaired Memory Impairment: Storage deficit;Retrieval deficit;Decreased short term memory;Decreased long term memory Decreased Long Term Memory: Verbal basic Decreased Short Term Memory: Verbal basic Awareness: Impaired Awareness Impairment: Intellectual impairment Problem Solving: Impaired Problem Solving Impairment: Verbal basic Safety/Judgment:  Impaired Sensation Sensation Light Touch: Appears Intact Hot/Cold: Not tested Proprioception: Impaired by gross assessment Stereognosis: Impaired by gross assessment Coordination Gross Motor Movements are Fluid and Coordinated: No Fine Motor Movements are Fluid and Coordinated: No Coordination and Movement Description: overshoots with LUE,  RUE limb apraxia affects coordination Heel Shin Test: unable to assess. Motor  Motor Motor: Hemiplegia;Motor apraxia   Trunk/Postural Assessment  Cervical Assessment Cervical Assessment: Within Functional Limits Lumbar Assessment Lumbar Assessment: Exceptions to [facility] (posterior pelvic tilt.) Postural Control Postural Control: Deficits on evaluation Trunk Control: pt leans to left corrects w/ cueing, but states "it feels like I'm falling to R" when in midline.  Balance Balance Balance Assessed: Yes Static Sitting Balance Static Sitting - Balance Support: Feet supported;Bilateral upper extremity supported Static Sitting - Level of Assistance: 3: Mod assist Static Sitting - Comment/# of Minutes: 1' w/ gradual posterior bias. Dynamic Sitting Balance Dynamic Sitting - Balance Support: Feet supported Dynamic Sitting - Level of Assistance: 2: Max assist Static Standing Balance Static Standing - Balance Support: Bilateral upper extremity supported (holding PT.) Static Standing - Level of Assistance: 1: +1 Total assist Dynamic Standing Balance Dynamic Standing - Balance Support: Bilateral upper extremity supported Dynamic Standing - Level of Assistance: 1: +1 Total assist Extremity Assessment  RUE Assessment RUE Assessment: Exceptions to [facility] Active Range of Motion (AROM) Comments: shoulder flexion to 100, limited AROM of horizontal add to reach R arm General Strength Comments: gross grasp 4-/5 RUE Body System: Neuro Brunstrum levels for arm and hand: Arm;Hand Brunstrum level for arm: Stage IV Movement is deviating from synergy Brunstrum level  for hand: Stage IV Movements deviating from synergies LUE Assessment LUE Assessment: Within Functional Limits RLE Assessment RLE Assessment: Exceptions to [facility] General Strength Comments: grossly at least 3+/5, but unable to formally test, pt w/ Full ROM. LLE Assessment LLE Assessment: Within Functional Limits General Strength Comments: appears grossly 4/5  Care Tool Care Tool Bed Mobility Roll left and right activity   Roll  left and right assist level: Moderate Assistance - Patient 50 - 74%    Sit to lying activity   Sit to lying assist level: Total Assistance - Patient < 25%    Lying to sitting on side of bed activity   Lying to sitting on side of bed assist level: the ability to move from lying on the back to sitting on the side of the bed with no back support.: Maximal Assistance - Patient 25 - 49%     Care Tool Transfers Sit to stand transfer   Sit to stand assist level: Total Assistance - Patient < 25%    Chair/bed transfer   Chair/bed transfer assist level: Total Assistance - Patient < 25%    Licensed conveyancer transfer activity did not occur: Safety/medical concerns        Care Tool Locomotion Ambulation Ambulation activity did not occur: Safety/medical concerns        Walk 10 feet activity Walk 10 feet activity did not occur: Safety/medical concerns       Walk 50 feet with 2 turns activity Walk 50 feet with 2 turns activity did not occur: Safety/medical concerns      Walk 150 feet activity Walk 150 feet activity did not occur: Safety/medical concerns      Walk 10 feet on uneven surfaces activity Walk 10 feet on uneven surfaces activity did not occur: Safety/medical concerns      Stairs Stair activity did not occur: Safety/medical concerns        Walk up/down 1 step activity Walk up/down 1 step or curb (drop down) activity did not occur: Safety/medical concerns      Walk up/down 4 steps activity Walk up/down 4 steps activity did not occur: Safety/medical  concerns      Walk up/down 12 steps activity Walk up/down 12 steps activity did not occur: Safety/medical concerns      Pick up small objects from floor Pick up small object from the floor (from standing position) activity did not occur: Safety/medical concerns      Wheelchair Is the patient using a wheelchair?: Yes Type of Wheelchair: Manual   Wheelchair assist level: Dependent - Patient 0%    Wheel 50 feet with 2 turns activity   Assist Level: Dependent - Patient 0%  Wheel 150 feet activity   Assist Level: Dependent - Patient 0%    Refer to Care Plan for Long Term Goals  SHORT TERM GOAL WEEK 1 PT Short Term Goal 1 (Week 1): Pt will transfer sup <> sit w/ mod A consistently. PT Short Term Goal 2 (Week 1): Pt will transfer sit to stand w/ max A. PT Short Term Goal 3 (Week 1): Pt will maintain seated balance > 5' at EOB. PT Short Term Goal 4 (Week 1): Pt will transfer SPT w/ max A +1. PT Short Term Goal 5 (Week 1): PT to assess gait.  Recommendations for other services: None   Skilled Therapeutic Intervention Evaluation completed (see details above and below) with education on PT POC and goals and individual treatment initiated with focus on  balance, midline sitting, NMR, transfers, gait, family ed.  Pt presents supine in bed and agreeable to therapy.  Pt rolls side to side w/ mod A and A placing hands on siderails.  Pt transfers sup to sit w/ Max A and cues.  Pt transfers sit to stand w/ Total A of 1, after attempting use of RW, PT in front of pt and then step-pivot to R  to recliner.  PT assist for weight shift and verbal cues for sequencing.  Pt sat in recliner for lunch, strong lean to L.  Pt attempts to use dominant R hand for eating, but unable.  Pt transfers sit to stand w/ Stedy and max A w/ cues for hand placement on bar.  Pt wheeled to gym for facility tour.  Pt returned to room and performed SPT w/c > bed w/ total A and same.  Pt required total A for sit to supine w/ cues  for sequencing.  Pt able to bridge (w/ assist to bring R knee into flexion for use) to center of bed.  Pt remained supine in bed w/ bed alarm on, 4 rails up and all needs in reach.     Mobility Bed Mobility Bed Mobility: Rolling Right;Rolling Left;Sit to Supine;Supine to Sit Rolling Right: Moderate Assistance - Patient 50-74% (siderail) Rolling Left: Moderate Assistance - Patient 50-74% (siderail) Supine to Sit: Maximal Assistance - Patient - Patient 25-49% Sit to Supine: Maximal Assistance - Patient 25-49% Transfers Transfers: Sit to Stand;Stand to Sit;Stand Pivot Transfers Sit to Stand: Maximal Assistance - Patient 25-49%;Total Assistance - Patient < 25% Stand to Sit: Maximal Assistance - Patient 25-49%;Total Assistance - Patient < 25% Stand Pivot Transfers: Total Assistance - Patient < 25% Stand Pivot Transfer Details: Verbal cues for sequencing;Verbal cues for precautions/safety Stand Pivot Transfer Details (indicate cue type and reason): for sequencing steps. Transfer (Assistive device): Other (Comment) (1 person facing, assisting w/ weight shift, attempted RW, unable to stand) Locomotion  Gait Ambulation: No Gait Gait: No Stairs / Additional Locomotion Stairs: No Wheelchair Mobility Wheelchair Mobility: No   Discharge Criteria: Patient will be discharged from PT if patient refuses treatment 3 consecutive times without medical reason, if treatment goals not met, if there is a change in medical status, if patient makes no progress towards goals or if patient is discharged from hospital.  The above assessment, treatment plan, treatment alternatives and goals were discussed and mutually agreed upon: by patient  Lucio Edward 02/23/2023, 2:42 PM

## 2023-02-23 NOTE — Progress Notes (Signed)
PMR Admission Coordinator Pre-Admission Assessment   Patient: Jeffrey Acevedo is an 82 y.o., male MRN: [id number] DOB: 07/27/1940 Height: 6' (1.829 m) Weight: 80.3 kg   Insurance Information HMO:     PPO: Yes     PCP:       IPA:       80/20:       OTHER:  Group [id number] PRIMARY: [employer] Medicare       Policy#: [id number]      Subscriber: self CM Name: Tommi Rumps      Phone#: [phone] X [phone]     Fax#: [phone] Pre-Cert#: [id number] approved on 02/21/23 and is valid for 5 days.  Follow up case manager is Audery Amel at [phone] X [phone] and fax [phone] for weekly updates.      Employer:  Benefits:  Phone #: [phone]     Name: online at [email or web address] Eff. Date: 03/20/22     Deduct: $0      Out of Pocket Max: $4000 (met $3249.45)      Life Max: n/a CIR: $160/day for 10 days with max $1600/admission      SNF: $0 copay for days 1-20, $50/dah for days 21-100 Outpatient: med nec     Co-Pay: $20/visit Home Health: 100%      Co-Pay: none DME: 80%     Co-Pay: 20% Providers: in network  SECONDARY:        Policy#:        Phone#:     Artist:        Phone#:     The Data processing manager" for patients in Inpatient Rehabilitation Facilities with attached "Privacy Act Statement-Health Care Records" was provided and verbally reviewed with: Patient and Family   Emergency Contact Information Contact Information       Name Relation Home Work Mobile    [place] Spouse [phone]        Linard, Mcgarrity Daughter     [phone]    Nancy Fetter Daughter [phone]   [phone]         Other Contacts   None on File        Current Medical History  Patient Admitting Diagnosis: B CVA   History of Present Illness: An 82 yo male with dementia, CHF, CAD s/p PCI (on DAPT), HTN, and HLD who presented 02/11/23 to [facility] ED with right sided weakness over the preceding 1-2 days.  CT head was negative for acute intracranial pathology.  CTA  head/neck was negative for large vessel occlusion or high-grade stenosis.  TTE showed no signs of embolic source.  LDL 18, A1C 4.8%.  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  PT/OT/SLP evaluations completed with recommendations for acute inpatient rehab admission.   NIH=3   Patient's medical record from [facility] has been reviewed by the rehabilitation admission coordinator and physician.   Past Medical History         Past Medical History:  Diagnosis Date   Acute on chronic diastolic CHF (congestive heart failure) (HCC) 01/03/2023   Cancer (HCC)      prostate cancer surgery 2011   Chronic diastolic heart failure (HCC)     Coronary artery disease      sees Dr. Erlene Quan 3 mths ago.  Stress test 03/31/2015 in epic   Degenerative joint disease      Right hip   Hearing loss of both ears      only at present wears the right  ear hearing aid   Hyperlipidemia     Hypertension             Has the patient had major surgery during 100 days prior to admission? No   Family History   family history includes Heart disease in his father; Hypertension in his father and mother.   Current Medications   Current Medications    Current Outpatient Medications:    aspirin EC 81 MG EC tablet, Take 1 tablet (81 mg total) by mouth daily. Swallow whole. (Patient taking differently: Take 81 mg by mouth every morning. Swallow whole.), Disp: 90 tablet, Rfl: 2   atorvastatin (LIPITOR) 40 MG tablet, Take 1 tablet (40 mg total) by mouth daily. (Patient taking differently: Take 40 mg by mouth every morning.), Disp: 30 tablet, Rfl: 0   Choline Fenofibrate (FENOFIBRIC ACID) 45 MG CPDR, Take 1 capsule by mouth once daily (Patient taking differently: Take 1 tablet by mouth at bedtime.), Disp: 90 capsule, Rfl: 1   clopidogrel (PLAVIX) 75 MG tablet, Take 1 tablet (75 mg total) by mouth daily. (Patient taking differently: Take 75 mg by mouth every morning.),  Disp: 90 tablet, Rfl: 3   Cyanocobalamin (VITAMIN B-12 PO), Take 2,500 mcg by mouth every morning., Disp: , Rfl:    empagliflozin (JARDIANCE) 10 MG TABS tablet, Take 1 tablet (10 mg total) by mouth daily., Disp: 30 tablet, Rfl: 0   ferrous sulfate 325 (65 FE) MG tablet, Take 1 tablet (325 mg total) by mouth daily with breakfast., Disp: 30 tablet, Rfl: 0   fluticasone (FLONASE) 50 MCG/ACT nasal spray, Place 1 spray into both nostrils daily., Disp: , Rfl:    furosemide (LASIX) 40 MG tablet, Take 1 tablet (40 mg total) by mouth 2 (two) times daily., Disp: 60 tablet, Rfl: 0   MAGNESIUM CITRATE PO, Take 400 mg by mouth every morning., Disp: , Rfl:    metoprolol tartrate (LOPRESSOR) 25 MG tablet, Take 1 tablet (25 mg total) by mouth daily., Disp: 90 tablet, Rfl: 3   nitroGLYCERIN (NITROSTAT) 0.4 MG SL tablet, Place 1 tablet (0.4 mg total) under the tongue every 5 (five) minutes as needed for chest pain., Disp: 25 tablet, Rfl: 3   Omega-3 Fatty Acids (FISH OIL) 1000 MG CAPS, Take 1,000 mg by mouth every other day., Disp: , Rfl:    polyethylene glycol powder (GLYCOLAX/MIRALAX) 17 GM/SCOOP powder, Take 17 g by mouth daily. (Patient taking differently: Take 17 g by mouth as needed.), Disp: 238 g, Rfl: 0   sertraline (ZOLOFT) 25 MG tablet, Take 1 tablet (25 mg total) by mouth daily., Disp: 90 tablet, Rfl: 3   spironolactone (ALDACTONE) 25 MG tablet, Take 0.5 tablets (12.5 mg total) by mouth daily., Disp: 15 tablet, Rfl: 0   spironolactone (ALDACTONE) 25 MG tablet, Take 1 tablet (25 mg total) by mouth daily., Disp: 90 tablet, Rfl: 0   triamcinolone cream (KENALOG) 0.1 %, Apply topically to the affected area 2 (two) times daily for 2 to 3 weeks (Patient taking differently: Apply 1 Application topically as needed.), Disp: 454 g, Rfl: 2      Patients Current Diet: Diet Regular diet, thin liquids   Precautions / Restrictions Precautions: Fall Precautions/Special Needs: Swallowing Weight Bearing Restrictions:  No     Has the patient had 2 or more falls or a fall with injury in the past year? Yes   Prior Activity Level Household: Homebound with visits to MD appointment only since 09/24     Prior Functional Level  Self Care: Did the patient need help bathing, dressing, using the toilet or eating? Needed some help   Indoor Mobility: Did the patient need assistance with walking from room to room (with or without device)? Needed some help   Stairs: Did the patient need assistance with internal or external stairs (with or without device)? Dependent   Functional Cognition: Did the patient need help planning regular tasks such as shopping or remembering to take medications? Needed some help   Patient Information Are you of Hispanic, Latino/a,or Spanish origin?: A. No, not of Hispanic, Latino/a, or Spanish origin What is your race?: A. White Do you need or want an interpreter to communicate with a doctor or health care staff?: 0. No   Patient's Response To:  Health Literacy and Transportation Is the patient able to respond to health literacy and transportation needs?: Yes Health Literacy - How often do you need to have someone help you when you read instructions, pamphlets, or other written material from your doctor or pharmacy?: Always In the past 12 months, has lack of transportation kept you from medical appointments or from getting medications?: No In the past 12 months, has lack of transportation kept you from meetings, work, or from getting things needed for daily living?: No   Home Assistive Devices / Research scientist (physical sciences), transport chair, manual wheelchair   Prior Device Use: Indicate devices/aids used by the patient prior to current illness, exacerbation or injury? Manual wheelchair and Walker     Prior Functional Level Current Functional Level  Bed Mobility   Independent Max assist    Transfers   Independent   Mod assist    Mobility - Walk/Wheelchair   Mod Independent   Mod  assist    Upper Body Dressing   Mod Independent   Mod assist    Lower Body Dressing   Min assist   Max assist    Grooming   Independent   Min assist    Eating/Drinking   Independent   Mod assist    Toilet Transfer   Independent   Total assist    Bladder Continence    Continent   Continent    Bowel Management   Continent   Continent    Stair Climbing   Max assist   Total assist    Communication   Verbally intact   Verbally intact, but is forgetful    Memory   Impaired, history of dementia Impaired      Special Needs/ Care Considerations Oxygen 2L/Union and Behavioral consideration Has h/o dementia   Previous Home Environment (from acute therapy documentation) Living Arrangements: Spouse/significant other  Lives With: Spouse Available Help at Discharge: Family; Personal care attendant; Available 24 hours/day Type of Home: House Home Layout: One level; Multi-level Alternate Level Stairs-Number of Steps: Flight Home Access: Stairs to enter Secretary/administrator of Steps: 5 Bathroom Shower/Tub: Health visitor: Standard Bathroom Accessibility: Yes How Accessible: Accessible via walker Additional Comments: Has a chair lift     Discharge Living Setting Plans for Discharge Living Setting: House; Lives with (comment) (Plans to DC home to Dtrs home with wife.) Type of Home at Discharge: House Discharge Home Layout: One level Discharge Home Access: Stairs to enter Entrance Stairs-Rails: Left Entrance Stairs-Number of Steps: 3-4 steps Discharge Bathroom Shower/Tub: Curtain; Walk-in shower Discharge Bathroom Toilet: Handicapped height Discharge Bathroom Accessibility: Yes How Accessible: Accessible via walker Does the patient have any problems obtaining your medications?: No     Social/Family/Support Systems Patient Roles:  Spouse; Parent (Lives with dtr, SIL, wife, grandchildren) Contact Information: Nancy Fetter - daughter -  [phone] Anticipated Caregiver: Dtrs and caregivers hired to assist Anticipated Caregiver's Contact Information: Selena Batten - daughter - [phone] Ability/Limitations of Caregiver: Dtr Selena Batten works days, wife cannot assist much, has hired caregivers during the day 9 am to 5 pm. Caregiver Availability: 24/7 Discharge Plan Discussed with Primary Caregiver: Yes Is Caregiver In Agreement with Plan?: Yes Does Caregiver/Family have Issues with Lodging/Transportation while Pt is in Rehab?: No     Goals Patient/Family Goal for Rehab: PT/OT/SLP supervision to min assist goals Expected length of stay: 12-14 days Pt/Family Agrees to Admission and willing to participate: Yes Program Orientation Provided & Reviewed with Pt/Caregiver Including Roles  & Responsibilities: Yes     Decrease burden of Care through IP rehab admission: N/A   Possible need for SNF placement upon discharge: Not planned   Patient Condition: I have reviewed medical records from [facility] spoken with CM, and daughter. I discussed via phone for inpatient rehabilitation assessment.  Patient will benefit from ongoing PT, OT, and SLP, can actively participate in 3 hours of therapy a day 5 days of the week, and can make measurable gains during the admission.  Patient will also benefit from the coordinated team approach during an Inpatient Acute Rehabilitation admission.  The patient will receive intensive therapy as well as Rehabilitation physician, nursing, social worker, and care management interventions.  Due to bladder management, bowel management, safety, skin/wound care, disease management, medication administration, pain management, and patient education the patient requires 24 hour a day rehabilitation nursing.  The patient is currently mod to max assist with mobility and basic ADLs.  Discharge setting and therapy post discharge at home with home health is anticipated.  Patient has agreed to participate in the Acute Inpatient  Rehabilitation Program and will admit today.   Preadmission Screen Completed By:  Trish Mage, 02/20/2023 11:55 AM ______________________________________________________________________   Discussed status with Dr. Riley Kill on 02/22/23 at 0945 and received approval for admission today.   Admission Coordinator:  Trish Mage, RN, time 1018/Date 02/22/23    Assessment/Plan: Diagnosis: CVA- L parietal, temporal and R parietal  Does the need for close, 24 hr/day Medical supervision in concert with the patient's rehab needs make it unreasonable for this patient to be served in a less intensive setting? Yes Co-Morbidities requiring supervision/potential complications: CHF, dementia; HTN, HLD; NIHSS 3 Due to bladder management, bowel management, safety, skin/wound care, disease management, medication administration, and patient education, does the patient require 24 hr/day rehab nursing? Yes Does the patient require coordinated care of a physician, rehab nurse, PT, OT, and SLP to address physical and functional deficits in the context of the above medical diagnosis(es)? Yes Addressing deficits in the following areas: balance, endurance, locomotion, strength, transferring, bowel/bladder control, bathing, dressing, feeding, grooming, toileting, cognition, speech, language, and swallowing Can the patient actively participate in an intensive therapy program of at least 3 hrs of therapy 5 days a week? Yes The potential for patient to make measurable gains while on inpatient rehab is good and fair Anticipated functional outcomes upon discharge from inpatient rehab: supervision and min assist PT, supervision and min assist OT, supervision and min assist SLP Estimated rehab length of stay to reach the above functional goals is: 12-14 days Anticipated discharge destination: Home 10. Overall Rehab/Functional Prognosis: good and fair   MD Signature          Revision History  Revision  History

## 2023-02-23 NOTE — Progress Notes (Signed)
   02/23/23 0931  What Happened  Was fall witnessed? No  Was patient injured? No  Patient found on floor  Found by Staff-comment Renae Fickle RN)  Provider Notification  Provider Name/Title Pam  Date Provider Notified 02/23/23  Time Provider Notified 0945  Method of Notification Call  Notification Reason Fall  Provider response See new orders  Date of Provider Response 02/23/23  Time of Provider Response 0946  Follow Up  Family notified Yes - comment  Time family notified 0950  Adult Fall Risk Assessment  Age 82  Fall History: Fall within 6 months prior to admission 5  Elimination; Bowel and/or Urine Incontinence 2  Elimination; Bowel and/or Urine Urgency/Frequency 2  Medications: includes PCA/Opiates, Anti-convulsants, Anti-hypertensives, Diuretics, Hypnotics, Laxatives, Sedatives, and Psychotropics 5  Patient Care Equipment 0  Mobility-Assistance 2  Mobility-Gait 2  Mobility-Sensory Deficit 2  Altered awareness of immediate physical environment 1  Impulsiveness 2  Lack of understanding of one's physical/cognitive limitations 4  Total Score 30  Adult Fall Risk Interventions  Additional Interventions PT/OT need assessed if change in mobility from baseline  Fall intervention(s) refused/Patient educated regarding refusal Bed alarm;Nonskid socks;Yellow bracelet;Open door if unsupervised;Supervision while toileting/edge of bed sitting  Screening for Fall Injury Risk (To be completed on HIGH fall risk patients) - Assessing Need for Floor Mats  Risk For Fall Injury- Criteria for Floor Mats Confusion/dementia (+NuDESC, CIWA, TBI, etc.)  Will Implement Floor Mats Yes  Vitals  Temp 97.7 F (36.5 C)  BP (!) 151/81  MAP (mmHg) 100  BP Location Left Arm  BP Method Automatic  Patient Position (if appropriate) Lying  Pulse Rate 79  Pulse Rate Source Monitor  Resp 20  Oxygen Therapy  SpO2 95 %  O2 Device Room Air  Neurological  Neuro (WDL) X  Level of Consciousness Alert  Orientation  Level Oriented to person  Cognition Follows commands  Speech Clear  Motor Function/Sensation Assessment Motor response  RUE Motor Response Non-purposeful movement  LUE Motor Response Purposeful movement  RLE Motor Response Non-purposeful movement  LLE Motor Response Purposeful movement  Neuro Symptoms Forgetful  Musculoskeletal  Musculoskeletal (WDL) X  Generalized Weakness Yes  Weight Bearing Restrictions No  Integumentary  Integumentary (WDL) X  Skin Color Appropriate for ethnicity  Skin Condition Dry  Skin Integrity Other (Comment) (nodules generalized)

## 2023-02-23 NOTE — Progress Notes (Signed)
Patient ID: Jeffrey Acevedo, male   DOB: 10-29-1940, 82 y.o.   MRN: [id number]   Sw left a detailed VM with pt, daughter, Selena Batten to introduce self and explain role. Sw will wait for follow up.

## 2023-02-23 NOTE — Progress Notes (Signed)
Inpatient Rehabilitation Care Coordinator Assessment and Plan Patient Details  Name: Jeffrey Acevedo MRN: [id number] Date of Birth: Jan 22, 1941  Today's Date: 02/23/2023  Hospital Problems: Principal Problem:   Stroke (cerebrum) [facility])  Past Medical History:  Past Medical History:  Diagnosis Date   Acute on chronic diastolic CHF (congestive heart failure) (HCC) 01/03/2023   Cancer (HCC)    prostate cancer surgery 2011   Chronic diastolic heart failure (HCC)    Coronary artery disease    sees Dr. Erlene Quan 3 mths ago.  Stress test 03/31/2015 in epic   Degenerative joint disease    Right hip   Depression    Hearing loss of both ears    only at present wears the right ear hearing aid   Hyperlipidemia    Hypertension    Prostate cancer [facility])    Past Surgical History:  Past Surgical History:  Procedure Laterality Date   CARDIAC CATHETERIZATION  06/17/2009   Proximal LAD 90% stenosis just beyond previously stented segment with a Multi-Link Vision bare-metal stent   CORONARY ANGIOPLASTY     stents placed in 1998 and 2011    CORONARY PRESSURE/FFR STUDY N/A 05/02/2021   Procedure: INTRAVASCULAR PRESSURE WIRE/FFR STUDY;  Surgeon: Runell Gess, MD;  Location: [facility] INVASIVE CV LAB;  Service: Cardiovascular;  Laterality: N/A;   CORONARY PRESSURE/FFR STUDY N/A 09/19/2021   Procedure: INTRAVASCULAR PRESSURE WIRE/FFR STUDY;  Surgeon: Runell Gess, MD;  Location: [facility] INVASIVE CV LAB;  Service: Cardiovascular;  Laterality: N/A;  RAMUS   CORONARY STENT INTERVENTION N/A 05/02/2021   Procedure: CORONARY STENT INTERVENTION;  Surgeon: Runell Gess, MD;  Location: [facility] INVASIVE CV LAB;  Service: Cardiovascular;  Laterality: N/A;   CORONARY STENT INTERVENTION N/A 09/19/2021   Procedure: CORONARY STENT INTERVENTION;  Surgeon: Runell Gess, MD;  Location: [facility] INVASIVE CV LAB;  Service: Cardiovascular;  Laterality: N/A;   CORONARY STENT INTERVENTION N/A 12/29/2022   Procedure: CORONARY STENT  INTERVENTION;  Surgeon: Kathleene Hazel, MD;  Location: [facility] INVAS[facility] CV [facility];  Service: Cardiovascular;  Laterality: N/A;   LEFT HEART CATH AND CORONARY ANGIOGRAPHY N/A 05/02/2021   Procedure: LEFT HEART CATH AND CORONARY ANGIOGRAPHY;  Surgeon: Runell Gess, MD;  [facility]: [facility];  Service: Cardiovascular;  Laterality: N/A;   LEFT HEART CATH AND CORONARY ANGIOGRAPHY N/A 09/19/2021   Procedure: LEFT HEART CATH AND CORONARY ANGIOGRAPHY;  Surgeon: Runell Gess, MD;  [facility]: [facility];  Service: Cardiovascular;  Laterality: N/A;   NASAL SEPTUM SURGERY     Dr. Richardson Landry @ [facility]  2014   [facility]  2011   RIGHT/LEFT HEART CATH AND CORONARY ANGIOGRAPHY N/A 12/29/2022   Procedure: RIGHT/LEFT HEART CATH AND CORONARY ANGIOGRAPHY;  Surgeon: Kathleene Hazel, MD;  [facility]: [facility];  Service: Cardiovascular;  Laterality: N/A;   ROBOT ASSISTED LAPAROSCOPIC RADICAL PROSTATECTOMY     TOTAL HIP ARTHROPLASTY Right 04/19/2015   Procedure: RIGHT TOTAL HIP ARTHROPLASTY ANTERIOR APPROACH;  Surgeon: Jodi Geralds, MD;  Location: [facility] OR;  Service: Orthopedics;  Laterality: Right;   Social History:  reports that he has never smoked. He has never used smokeless tobacco. He reports that he does not drink alcohol and does not use drugs.  Family / Support Systems Marital Status: Married How Long?: n/a Patient Roles: Spouse, Parent (Lives with daughter, son in Social worker, grandchildren and spouse.) Spouse/Significant Other: Carman Ching Children: Barb Merino and Nancy Fetter (Daughters) Other Supports: Hired Caregivers  Anticipated Caregiver: Daughter and hired caregiver 9a-5p Ability/Limitations of Caregiver: Daughter Selena Batten works during the day. Spouse unable to provide assistance. Dtrs hire caregivers 9a-5p. Caregiver Availability: 24/7 Family Dynamics: supportive family  Social History Preferred language: English Religion: Baptist Cultural Background:  n/a Education: HS Health Literacy - How often do you need to have someone help you when you read instructions, pamphlets, or other written material from your doctor or pharmacy?: Always Writes: Yes Employment Status: Retired Date Retired/Disabled/Unemployed: n/a Marine scientist Issues: n/a Guardian/Conservator: Barb Merino   Abuse/Neglect Abuse/Neglect Assessment Can Be Completed: Yes Physical Abuse: Denies Verbal Abuse: Denies Sexual Abuse: Denies Exploitation of patient/patient's resources: Denies Self-Neglect: Denies  Patient response to: Social Isolation - How often do you feel lonely or isolated from those around you?: Never  Emotional Status Pt's affect, behavior and adjustment status: Pleasant. Recent Psychosocial Issues: Coping Psychiatric History: N/A Substance Abuse History: N/A  Patient / Family Perceptions, Expectations & Goals Pt/Family understanding of illness & functional limitations: yes, left VM with daughter Premorbid pt/family roles/activities: Requiring assistance prior and only going out for appointments Anticipated changes in roles/activities/participation: Plan to return home with family. Family has hired caregivers 9a-5p. Spouse unable to proide assistance. Dtr, kim works during the day. Pt/family expectations/goals: Sup/Min A  [employer]: None Premorbid Home Care/DME Agencies: Other (Comment) (RW, wheelchair and transport chair) Transportation available at discharge: children Is the patient able to respond to transportation needs?: Yes In the past 12 months, has lack of transportation kept you from medical appointments or from getting medications?: No In the past 12 months, has lack of transportation kept you from meetings, work, or from getting things needed for daily living?: No Resource referrals recommended: Neuropsychology  Discharge Planning Living Arrangements: Spouse/significant other, Children, Other  (Comment) Support Systems: Spouse/significant other, Children, Other (Comment) Type of Residence: Private residence (multi level home, chair lift. 3-4 steps.) Insurance Resources: Media planner (specify) Multimedia programmer Medicare) Financial Resources: Family Support Financial Screen Referred: No Living Expenses: Lives with family Money Management: Family Does the patient have any problems obtaining your medications?: No Home Management: Needed assistance Patient/Family Preliminary Plans: Plans to continue receiving assistance from family and caregivers Care Coordinator Barriers to Discharge: Lack of/limited family support, Decreased caregiver support, Insurance for SNF coverage Care Coordinator Anticipated Follow Up Needs: HH/OP Expected length of stay: 12-14 Days  Clinical Impression SW met with patient, introduced self and explained role. Sw made attempt to daughter daughter at bedside, SW left a detailed VM. Patient discharging back home with his daughter, spouse, SIL and grandchildren. Multi level home, about 5 steps to enter. Pt currently has a wheelchair, transport chair and rolling walker. Sw will confirm d/c information with family upon follow up. No additional questions or concerns currently.   Andria Rhein 02/23/2023, 12:50 PM

## 2023-02-23 NOTE — Progress Notes (Signed)
Inpatient Rehabilitation Center Individual Statement of Services  Patient Name:  Jeffrey Acevedo  Date:  02/23/2023  Welcome to the Inpatient Rehabilitation Center.  Our goal is to provide you with an individualized program based on your diagnosis and situation, designed to meet your specific needs.  With this comprehensive rehabilitation program, you will be expected to participate in at least 3 hours of rehabilitation therapies Monday-Friday, with modified therapy programming on the weekends.  Your rehabilitation program will include the following services:  Physical Therapy (PT), Occupational Therapy (OT), Speech Therapy (ST), 24 hour per day rehabilitation nursing, Therapeutic Recreaction (TR), Neuropsychology, Care Coordinator, Rehabilitation Medicine, Nutrition Services, Pharmacy Services, and Other  Weekly team conferences will be held on Wednesdays to discuss your progress.  Your Inpatient Rehabilitation Care Coordinator will talk with you frequently to get your input and to update you on team discussions.  Team conferences with you and your family in attendance may also be held.  Expected length of stay: 12-14 Days  Overall anticipated outcome: supervision to min assist goals   Depending on your progress and recovery, your program may change. Your Inpatient Rehabilitation Care Coordinator will coordinate services and will keep you informed of any changes. Your Inpatient Rehabilitation Care Coordinator's name and contact numbers are listed  below.  The following services may also be recommended but are not provided by the Inpatient Rehabilitation Center:   Home Health Rehabiltiation Services Outpatient Rehabilitation Services    Arrangements will be made to provide these services after discharge if needed.  Arrangements include referral to agencies that provide these services.  Your insurance has been verified to be:  [employer] Your primary doctor is:  Feliciana Rossetti,  MD  Pertinent information will be shared with your doctor and your insurance company.  Inpatient Rehabilitation Care Coordinator:  Lavera Guise, [place] [phone] or [phone]  Information discussed with and copy given to patient by: Andria Rhein, 02/23/2023, 11:52 AM

## 2023-02-23 NOTE — Evaluation (Signed)
Occupational Therapy Assessment and Plan  Patient Details  Name: Jeffrey Acevedo MRN: [id number] Date of Birth: [birth year]  OT Diagnosis: apraxia, cognitive deficits, disturbance of vision, and hemiplegia affecting dominant side Rehab Potential: Rehab Potential (ACUTE ONLY): Fair ELOS: 20-21 days   Today's Date: 02/23/2023 OT Individual Time: 0930-1100 OT Individual Time Calculation (min): 90 min     Hospital Problem: Principal Problem:   Stroke (cerebrum) [facility])   Past Medical History:  Past Medical History:  Diagnosis Date   Acute on chronic diastolic CHF (congestive heart failure) (HCC) 01/03/2023   Cancer (HCC)    prostate cancer surgery 2011   Chronic diastolic heart failure (HCC)    Coronary artery disease    sees Dr. Erlene Acevedo 3 mths ago.  Stress test 03/31/2015 in epic   Degenerative joint disease    Right hip   Depression    Hearing loss of both ears    only at present wears the right ear hearing aid   Hyperlipidemia    Hypertension    Prostate cancer [facility])    Past Surgical History:  Past Surgical History:  Procedure Laterality Date   CARDIAC CATHETERIZATION  06/17/2009   Proximal LAD 90% stenosis just beyond previously stented segment with a Multi-Link Vision bare-metal stent   CORONARY ANGIOPLASTY     stents placed in 1998 and 2011    CORONARY PRESSURE/FFR STUDY N/A 05/02/2021   Procedure: INTRAVASCULAR PRESSURE WIRE/FFR STUDY;  Surgeon: Jeffrey Gess, MD;  Location: [facility] INV[facility];  Service: Cardiovascular;  Laterality: N/A;   CORONARY PRESSURE/FFR STUDY N/A 09/19/2021   Procedure: INTRAVASCULAR PRESSURE WIRE/FFR STUDY;  Surgeon: Jeffrey Gess, MD;  Location: [facility] INV[facility];  Service: Cardiovascular;  Laterality: N/A;  RAMUS   CORONARY STENT INTERVENTION N/A 05/02/2021   Procedure: CORONARY STENT INTERVENTION;  Surgeon: Jeffrey Gess, MD;  Location: [facility];  Service: Cardiovascular;  Laterality: N/A;   CORONARY STENT INTERVENTION N/A  09/19/2021   Procedure: CORONARY STENT INTERVENTION;  Surgeon: Jeffrey Gess, MD;  Location: [facility];  Service: Cardiovascular;  Laterality: N/A;   CORONARY STENT INTERVENTION N/A 12/29/2022   Procedure: CORONARY STENT INTERVENTION;  Surgeon: Jeffrey Hazel, MD;  [facility]: [facility];  Service: Cardiovascular;  Laterality: N/A;   LEFT HEART CATH AND CORONARY ANGIOGRAPHY N/A 05/02/2021   Procedure: LEFT HEART CATH AND CORONARY ANGIOGRAPHY;  Surgeon: Jeffrey Gess, MD;  [facility];  Service: Cardiovascular;  Laterality: N/A;   LEFT HEART CATH AND CORONARY ANGIOGRAPHY N/A 09/19/2021   Procedure: LEFT HEART CATH AND CORONARY ANGIOGRAPHY;  Surgeon: Jeffrey Gess, MD;  [facility];  Service: Cardiovascular;  Laterality: N/A;   NASAL SEPTUM SURGERY     Dr. Richardson Acevedo @ [facility]  2014   [facility]  2011   RIGHT/LEFT HEART CATH AND CORONARY ANGIOGRAPHY N/A 12/29/2022   Procedure: RIGHT/LEFT HEART CATH AND CORONARY ANGIOGRAPHY;  Surgeon: Jeffrey Hazel, MD;  [facility];  Service: Cardiovascular;  Laterality: N/A;   ROBOT ASSISTED LAPAROSCOPIC RADICAL PROSTATECTOMY     TOTAL HIP ARTHROPLASTY Right 04/19/2015   Procedure: RIGHT TOTAL HIP ARTHROPLASTY ANTERIOR APPROACH;  Surgeon: Jeffrey Geralds, MD;  Location: [facility] OR;  Service: Orthopedics;  Laterality: Right;    Assessment & Plan Clinical Impression:  Jeffrey Acevedo is a [name] with history of CAD, HTN, CKD 3a, chronic diastolic CHF, hearing  loss, dementia, who was admitted to [facility] on 02/15/23 with reports of right sided weakness and inability hold fork or to feed himself. He was found to have acute on chronic renal failure with SCr 106,  MRI brain done revealing  acute-subacute left temporal as well as left parietal and right parietal lobes. Tele- Neurology recommended loop recorder to rule out A fib as cause of bilateral strokes and to  continue home DAPT.    He has had issues with hypoxia since last Sept and has been requiring oxygen with activity. Also reports of right wrist pain with edema that is improving. Lasix was discontinued yesterday. Therapy has been working with patient and he continues to have delay in processing. He requires simple one step commands, has balance deficits with right lean on standing, multimodal cues needed to decrease pusher tendencies, right sided weakness with right wrist pain and tendency for right knee to buckle when standing. He was independent with walker PTA but has had falls in the past month and moved in with daughter recently. CIR recommended due to functional decline.  .  Patient transferred to CIR on 02/22/2023 .    Patient currently requires total with basic self-care skills secondary to motor apraxia, decreased visual perceptual skills, decreased attention to right and decreased motor planning, decreased safety awareness, decreased memory, and delayed processing, and decreased sitting balance, decreased standing balance, decreased postural control, hemiplegia, and decreased balance strategies.  Prior to hospitalization, patient could complete BADL with mod.  Patient will benefit from skilled intervention to increase independence with basic self-care skills prior to discharge home with care partner.  Anticipate patient will require 24 hour supervision and moderate physical assestance and follow up home health.  OT - End of Session Activity Tolerance: Tolerates 30+ min activity with multiple rests Endurance Deficit: Yes OT Assessment Rehab Potential (ACUTE ONLY): Fair OT Patient demonstrates impairments in the following area(s): Balance;Cognition;Endurance;Motor;Perception;Vision;Sensory OT Basic ADL's Functional Problem(s): Eating;Grooming;Bathing;Dressing;Toileting OT Transfers Functional Problem(s): Toilet;Tub/Shower OT Additional Impairment(s): Fuctional Use of Upper Extremity OT  Plan OT Intensity: Minimum of 1-2 x/day, 45 to 90 minutes OT Frequency: 5 out of 7 days OT Duration/Estimated Length of Stay: 20-21 days OT Treatment/Interventions: Balance/vestibular training;Cognitive remediation/compensation;DME/adaptive equipment instruction;Discharge planning;Functional mobility training;Neuromuscular re-education;Patient/family education;Pain management;Self Care/advanced ADL retraining;UE/LE Strength taining/ROM;Therapeutic Exercise;Therapeutic Activities;UE/LE Coordination activities;Visual/perceptual remediation/compensation OT Self Feeding Anticipated Outcome(s): set up OT Basic Self-Care Anticipated Outcome(s): mod A overall OT Toileting Anticipated Outcome(s): mod A OT Bathroom Transfers Anticipated Outcome(s): mod A to toilet, max A to shower OT Recommendation Patient destination: Home Follow Up Recommendations: Home health OT Equipment Recommended: To be determined   OT Evaluation Precautions/Restrictions  Precautions Precautions: Fall Precaution Comments: strong posterior lean and R lean Restrictions Weight Bearing Restrictions: No   Pain Pain Assessment Pain Scale: 0-10 Pain Score: 0-No pain Home Living/Prior Functioning Home Living Family/patient expects to be discharged to:: Private residence Living Arrangements: Spouse/significant other, Children, Other (Comment) Available Help at Discharge: Family, Personal care attendant, Available 24 hours/day Type of Home: House Home Access: Stairs to enter [employer] of Steps: 5 Entrance Stairs-Rails: Right Home Layout: One level, Multi-level Alternate Level Stairs-Number of Steps: Flight Bathroom Shower/Tub: Health visitor: Standard Additional Comments: Has a chair lift; family not present on eval. Information taken from preadmission note.  Lives With: Spouse Prior Function Level of Independence: Needs assistance with ADLs, Needs assistance with gait (; family not present  on eval. Information taken from preadmission note.) Driving: No Vocation: Retired Administrator, sports Baseline Vision/History: 1  Wears glasses Ability to See in Adequate Light: 1 Impaired Patient Visual Report: Other (comment) (pt unable to clearly comment on if there were any visual changes) Vision Assessment?: Vision impaired- to be further tested in functional context Additional Comments: unable to formally assess due to difficulty following directions.  pt may have a right visual field cut based on observation Perception  Perception: Impaired Perception-Other Comments: pt commented " I just cant get my left and right hands to work together." he demonstrated spatial deficits with reaching when using L hand Praxis Praxis: Impaired Praxis Impairment Details: Motor planning;Limb apraxia Praxis-Other Comments: pt has active movement of RUE (fair grasp and sh AROM to 100 but had great difficulty integrating it into function),  LUE overreached and demonstrated incoordination Cognition Cognition Overall Cognitive Status: Impaired/Different from baseline Arousal/Alertness: Awake/alert Orientation Level: Person;Situation ("I had a stroke") Memory: Impaired Memory Impairment: Storage deficit;Retrieval deficit;Decreased short term memory;Decreased long term memory Decreased Long Term Memory: Verbal basic Decreased Short Term Memory: Verbal basic Attention: Focused Focused Attention: Impaired Focused Attention Impairment: Verbal basic Awareness: Impaired Awareness Impairment: Intellectual impairment Problem Solving: Impaired Problem Solving Impairment: Verbal basic Safety/Judgment: Impaired Brief Interview for Mental Status (BIMS) Repetition of Three Words (First Attempt): 3 Temporal Orientation: Year: Correct Temporal Orientation: Month: Missed by 6 days to 1 month Temporal Orientation: Day: Correct Recall: "Sock": No, could not recall Recall: "Blue": Yes, no cue required Recall: "Bed": No, could  not recall BIMS Summary Score: 10 Sensation Sensation Light Touch: Appears Intact Hot/Cold: Not tested Proprioception: Impaired by gross assessment Stereognosis: Impaired by gross assessment Coordination Gross Motor Movements are Fluid and Coordinated: No Fine Motor Movements are Fluid and Coordinated: No Coordination and Movement Description: overshoots with LUE,  RUE limb apraxia affects coordination Motor  Motor Motor: Hemiplegia;Motor apraxia  Trunk/Postural Assessment  Postural Control Postural Control: Deficits on evaluation Trunk Control: progressively became more impaired during the session with a strong R and posterior lean.  Pt pushed off with L foot out of his base of support to push himself over to the R.  Balance Dynamic Sitting Balance Dynamic Sitting - Level of Assistance: 2: Max assist Dynamic Standing Balance Dynamic Standing - Level of Assistance: 1: +2 Total assist Extremity/Trunk Assessment RUE Assessment RUE Assessment: Exceptions to [facility] Active Range of Motion (AROM) Comments: shoulder flexion to 100, limited AROM of horizontal add to reach R arm General Strength Comments: gross grasp 4-/5 RUE Body System: Neuro Brunstrum levels for arm and hand: Arm;Hand Brunstrum level for arm: Stage IV Movement is deviating from synergy Brunstrum level for hand: Stage IV Movements deviating from synergies LUE Assessment LUE Assessment: Within Functional Limits  Care Tool Care Tool Self Care Eating   Eating Assist Level: Supervision/Verbal cueing    Oral Care    Oral Care Assist Level: Minimal Assistance - Patient > 75%    Bathing   Body parts bathed by patient: Chest;Abdomen;Face;Right arm Body parts bathed by helper: Left arm;Front perineal area;Buttocks;Right upper leg;Left lower leg;Right lower leg;Left upper leg   Assist Level: Maximal Assistance - Patient 24 - 49%    Upper Body Dressing(including orthotics)   What is the patient wearing?: Pull over  shirt   Assist Level: Maximal Assistance - Patient 25 - 49%    Lower Body Dressing (excluding footwear)   What is the patient wearing?: Underwear/pull up;Pants Assist for lower body dressing: Dependent - Patient 0%    Putting on/Taking off footwear   What is the patient wearing?: Non-skid slipper socks  Assist for footwear: Dependent - Patient 0%       Care Tool Toileting Toileting activity   Assist for toileting: 2 Helpers     Care Tool Bed Mobility Roll left and right activity   Roll left and right assist level: Moderate Assistance - Patient 50 - 74%    Sit to lying activity   Sit to lying assist level: Total Assistance - Patient < 25%    Lying to sitting on side of bed activity   Lying to sitting on side of bed assist level: the ability to move from lying on the back to sitting on the side of the bed with no back support.: Maximal Assistance - Patient 25 - 49%     Care Tool Transfers Sit to stand transfer   Sit to stand assist level: Dependent - Patient 0%    Chair/bed transfer   Chair/bed transfer assist level: 2 Chief Strategy Officer transfer activity did not occur: Safety/medical concerns (attempted a transfer with the stedy lift but pt leaning severely to the R and unable to get pt in a safe position)       Care Tool Cognition  Expression of Ideas and Wants Expression of Ideas and Wants: 3. Some difficulty - exhibits some difficulty with expressing needs and ideas (e.g, some words or finishing thoughts) or speech is not clear  Understanding Verbal and Non-Verbal Content Understanding Verbal and Non-Verbal Content: 3. Usually understands - understands most conversations, but misses some part/intent of message. Requires cues at times to understand   Memory/Recall Ability Memory/Recall Ability : That he or she is in a hospital/hospital unit   Refer to Care Plan for Long Term Goals  SHORT TERM GOAL WEEK 1 OT Short Term Goal 1 (Week 1): to increase  safety to be able to sit on a toilet, pt will maintain balance at EOB with CGA for at least 10 minutes. OT Short Term Goal 2 (Week 1): Pt will demonstrate improved motor planning of LUE to use L hand to help don shirt with mod A. OT Short Term Goal 3 (Week 1): Pt will use R hand to wash L arm with min A. OT Short Term Goal 4 (Week 1): pt will rise to stand in stedy lift with mod A of 1 5x in a row to ensure he can safely use stedy lift for toilet transfers.  Recommendations for other services: None    Skilled Therapeutic Intervention ADL ADL Eating: Supervision/safety Grooming: Minimal assistance Upper Body Bathing: Maximal assistance Where Assessed-Upper Body Bathing: Sitting at sink Lower Body Bathing: Dependent Where Assessed-Lower Body Bathing: Sitting at sink Upper Body Dressing: Maximal assistance Where Assessed-Upper Body Dressing: Sitting at sink Lower Body Dressing: Dependent Where Assessed-Lower Body Dressing: Other (Comment) (with use of a stedy lift and 2 persons) Toileting: Dependent Where Assessed-Toileting: Bed level Toilet Transfer: Unable to assess (attempted transfer but not safe due to leaning) Film/video editor Method: Unable to assess Mobility  Transfers Sit to Stand: Total Assistance - Patient < 25% Stand to Sit: Total Assistance - Patient < 25%  Pt seen for initial evaluation and ADL training with a focus on postural control.  See above notes for status on mobility, balance, coordination as pt engaged in transfers, bathing , dressing and attempted toileting at the toilet.  On first transfer, pt pivoted with heavy max A. Then stood in stedy with first time with heavy max A and cues to lean hips forward. As each  stand progressed during bathing and dressing process, pt became unable to rise to stand without TOTAL A of 2.  Nurse tech assisted me and helped me get pt back to bed.  Pt able to follow directions intermittently. He understood purpose of rehab and why  he was here but had intermittent confusion.  Reviewed role of OT, discussed POC and pt's goals, and ELOS.  Will need to review with his family when then come to the hospital.  Pt resting in bed  With all needs met and bed alarm set.      Discharge Criteria: Patient will be discharged from OT if patient refuses treatment 3 consecutive times without medical reason, if treatment goals not met, if there is a change in medical status, if patient makes no progress towards goals or if patient is discharged from hospital.  The above assessment, treatment plan, treatment alternatives and goals were discussed and mutually agreed upon: by patient  [facility] 02/23/2023, 1:36 PM

## 2023-02-23 NOTE — Evaluation (Signed)
Speech Language Pathology Assessment and Plan  Patient Details  Name: Jeffrey Acevedo MRN: [id number] Date of Birth: 05-Mar-1941  SLP Diagnosis: Voice disorder;Dysphagia;Cognitive Impairments;Speech and Language deficits  Rehab Potential: Good ELOS: 3 weeks    Today's Date: 02/23/2023 SLP Individual Time: 2952-8413 SLP Individual Time Calculation (min): 61 min   Hospital Problem: Principal Problem:   Stroke (cerebrum) [facility])  Past Medical History:  Past Medical History:  Diagnosis Date   Acute on chronic diastolic CHF (congestive heart failure) (HCC) 01/03/2023   Cancer (HCC)    prostate cancer surgery 2011   Chronic diastolic heart failure (HCC)    Coronary artery disease    sees Dr. Erlene Quan 3 mths ago.  Stress test 03/31/2015 in epic   Degenerative joint disease    Right hip   Depression    Hearing loss of both ears    only at present wears the right ear hearing aid   Hyperlipidemia    Hypertension    Prostate cancer [facility])    Past Surgical History:  Past Surgical History:  Procedure Laterality Date   CARDIAC CATHETERIZATION  06/17/2009   Proximal LAD 90% stenosis just beyond previously stented segment with a Multi-[employer] Vision bare-metal stent   CORONARY ANGIOPLASTY     stents placed in 1998 and 2011    CORONARY PRESSURE/FFR STUDY N/A 05/02/2021   Procedure: INTRAVASCULAR PRESSURE WIRE/FFR STUDY;  Surgeon: Runell Gess, MD;  Location: [facility];  Service: Cardiovascular;  Laterality: N/A;   CORONARY PRESSURE/FFR STUDY N/A 09/19/2021   Procedure: INTRAVASCULAR PRESSURE WIRE/FFR STUDY;  Surgeon: Runell Gess, MD;  Location: [facility];  Service: Cardiovascular;  Laterality: N/A;  RAMUS   CORONARY STENT INTERVENTION N/A 05/02/2021   Procedure: CORONARY STENT INTERVENTION;  Surgeon: Runell Gess, MD;  Location: [facility];  Service: Cardiovascular;  Laterality: N/A;   CORONARY STENT INTERVENTION N/A 09/19/2021   Procedure: CORONARY STENT  INTERVENTION;  Surgeon: Runell Gess, MD;  Location: [facility];  Service: Cardiovascular;  Laterality: N/A;   CORONARY STENT INTERVENTION N/A 12/29/2022   Procedure: CORONARY STENT INTERVENTION;  Surgeon: Kathleene Hazel, MD;  [facility]: [facility];  Service: Cardiovascular;  Laterality: N/A;   LEFT HEART CATH AND CORONARY ANGIOGRAPHY N/A 05/02/2021   Procedure: LEFT HEART CATH AND CORONARY ANGIOGRAPHY;  Surgeon: Runell Gess, MD;  [facility]: [facility];  Service: Cardiovascular;  Laterality: N/A;   LEFT HEART CATH AND CORONARY ANGIOGRAPHY N/A 09/19/2021   Procedure: LEFT HEART CATH AND CORONARY ANGIOGRAPHY;  Surgeon: Runell Gess, MD;  [facility];  Service: Cardiovascular;  Laterality: N/A;   NASAL SEPTUM SURGERY     Dr. Richardson Landry @ [facility]  2014   [facility]  2011   RIGHT/LEFT HEART CATH AND CORONARY ANGIOGRAPHY N/A 12/29/2022   Procedure: RIGHT/LEFT HEART CATH AND CORONARY ANGIOGRAPHY;  Surgeon: Kathleene Hazel, MD;  [facility];  Service: Cardiovascular;  Laterality: N/A;   ROBOT ASSISTED LAPAROSCOPIC RADICAL PROSTATECTOMY     TOTAL HIP ARTHROPLASTY Right 04/19/2015   Procedure: RIGHT TOTAL HIP ARTHROPLASTY ANTERIOR APPROACH;  Surgeon: Jodi Geralds, MD;  Location: [facility] OR;  Service: Orthopedics;  Laterality: Right;    Assessment / Plan / Recommendation Clinical Impression HPI: An 82 yo male with dementia, CHF, CAD s/p PCI (on DAPT), HTN, and HLD who presented 02/11/23 to [facility] ED with right sided weakness over the  preceding 1-2 days. CT head was negative for acute intracranial pathology. CTA head/neck was negative for large vessel occlusion or high-grade stenosis. TTE showed no signs of embolic source. LDL 18, A1C 4.8%. 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   Clinical Impression:  Bedside Swallow Evaluation: A bedside swallow  assessment was completed to evaluate for s/sx of oropharyngeal dysphagia. Oral mechanism exam WFL. Patient with consumption of thin liquids via cup and straw, puree and solids. No s/sx of aspiration noted during meal, however patient with intermittent throat clears throughout session. Patient with mild R buccal pocketing and diffuse oral residuals during consumption of solid, however cleared via liquid wash. Recommend D3/thin liquid diet with medications crushed in puree. Full supervision to aid in feeding and following strategies. Patient should follow standardized swallowing precautions (small bites/sips, slow rate, sit upright at 90 degrees), lingual/digital sweep and liquid wash after solids.  Communication: Patient with functional expressive language skills characterized by ability to name functional items in room and intact repetition. Patient able to answer basic yes/no questions with 100% accuracy, however with difficulty during more complex command tasks. This deficit is likely due to significant cognitive deficits rather than language.  Cognition: Patient presents with significant cognitive deficits likely due to baseline dementia and new CVA. Patient with severe deficits in memory, orientation, intellectual awareness, basic problem solving and attention. Patient unable to recall age, situation, and time indicating poor intellectual awareness and orientation. Patient with consistent language of confusion throughout session often requiring cues to attend to task. During immediate recall task, patient with difficulties and subsequent inability to recall 0/4 vocabulary words after a delay. Problem solving deficits noted during functional scenarios task and executive functioning questions.  Speech: Patient presents with low vocal intensity and strained vocal quality resulting in ~70% intelligibility at the phrase level.  Pt would benefit from skilled ST services to maximize speech intelligibility,  dysphagia, and cognition in order to maximize functional independence at d/c. Anticipate patient will require 24 hour supervision at d/c and f/u SLP services.    Skilled Therapeutic Interventions          Patient evaluated using a non-standardized cognitive linguistic assessment and bedside swallow evaluation to assess current cognitive, communicative and swallowing function. See above for details.    SLP Assessment  Patient will need skilled Speech Lanaguage Pathology Services during CIR admission    Recommendations  SLP Diet Recommendations: Dysphagia 3 (Mech soft);Thin Liquid Administration via: Straw;Cup Medication Administration: Crushed with puree Supervision: Staff to assist with self feeding;Full supervision/cueing for compensatory strategies Compensations: Minimize environmental distractions;Slow rate;Small sips/bites;Follow solids with liquid;Lingual sweep for clearance of pocketing Postural Changes and/or Swallow Maneuvers: Seated upright 90 degrees Oral Care Recommendations: Oral care BID Patient destination: Home Follow up Recommendations: Outpatient SLP;Home Health SLP;24 hour supervision/assistance Equipment Recommended: None recommended by SLP    SLP Frequency 3 to 5 out of 7 days   SLP Duration  SLP Intensity  SLP Treatment/Interventions 3 weeks  Minumum of 1-2 x/day, 30 to 90 minutes  Cognitive remediation/compensation;Dysphagia/aspiration precaution training;Internal/external aids;Speech/Language facilitation;Cueing hierarchy;Therapeutic Activities;Functional tasks;Multimodal communication approach;Patient/family education;Therapeutic Exercise    Pain None reported   SLP Evaluation Cognition Overall Cognitive Status: Impaired/Different from baseline Arousal/Alertness: Awake/alert Orientation Level: Oriented to person;Disoriented to place;Disoriented to time Year: Other (Comment) (2007) Month: January Day of Week: Incorrect Attention: Focused Focused  Attention: Impaired Focused Attention Impairment: Verbal basic Memory: Impaired Memory Impairment: Storage deficit;Retrieval deficit;Decreased short term memory;Decreased long term memory Decreased  Long Term Memory: Verbal basic Decreased Short Term Memory: Verbal basic Awareness: Impaired Awareness Impairment: Intellectual impairment Problem Solving: Impaired Problem Solving Impairment: Verbal basic Safety/Judgment: Impaired  Comprehension Auditory Comprehension Overall Auditory Comprehension: Other (comment) (impaired vs due to significant cognitive deficits) Yes/No Questions: Within Functional Limits (simple yes/no) Commands: Impaired One Step Basic Commands: 75-100% accurate Two Step Basic Commands: 75-100% accurate Multistep Basic Commands: 50-74% accurate Interfering Components: Attention;Hearing;Working Radio broadcast assistant: Extra processing time;Increased volume;Repetition Expression Expression Primary Mode of Expression: Verbal Verbal Expression Overall Verbal Expression: Appears within functional limits for tasks assessed Initiation: No impairment Repetition: No impairment Naming: No impairment Oral Motor Oral Motor/Sensory Function Overall Oral Motor/Sensory Function: Within functional limits Motor Speech Overall Motor Speech: Impaired Respiration: Within functional limits Level of Impairment: Phrase Phonation: Other (comment);Low vocal intensity (strained) Resonance: Within functional limits Articulation: Impaired Level of Impairment: Phrase Intelligibility: Intelligibility reduced Word: 75-100% accurate Phrase: 75-100% accurate Motor Planning: Witnin functional limits Motor Speech Errors: Not applicable Effective Techniques: Increased vocal intensity;Over-articulate;Slow rate  Care Tool Care Tool Cognition Ability to hear (with hearing aid or hearing appliances if normally used Ability to hear (with hearing aid or hearing appliances if normally  used): 2. Moderate difficulty - speaker has to increase volume and speak distinctly   Expression of Ideas and Wants Expression of Ideas and Wants: 3. Some difficulty - exhibits some difficulty with expressing needs and ideas (e.g, some words or finishing thoughts) or speech is not clear   Understanding Verbal and Non-Verbal Content Understanding Verbal and Non-Verbal Content: 3. Usually understands - understands most conversations, but misses some part/intent of message. Requires cues at times to understand  Memory/Recall Ability Memory/Recall Ability : That he or she is in a hospital/hospital unit   Bedside Swallowing Assessment General Previous Swallow Assessment: none found in record Diet Prior to this Study: Regular;Thin liquids (Level 0) Respiratory Status: Room air Behavior/Cognition: Confused;Cooperative;Requires cueing Oral Cavity - Dentition: Adequate natural dentition Self-Feeding Abilities: Needs assist Patient Positioning: Upright in bed Baseline Vocal Quality: Low vocal intensity;Other (comment) (strained) Volitional Cough: Weak Volitional Swallow: Able to elicit  Ice Chips Ice chips: Not tested Thin Liquid Thin Liquid: Within functional limits Presentation: Cup;Self Fed;Straw Nectar Thick Nectar Thick Liquid: Not tested Honey Thick Honey Thick Liquid: Not tested Puree Puree: Within functional limits Presentation: Self Fed;Spoon Solid Solid: Impaired Presentation: Self Fed Oral Phase Functional Implications: Right lateral sulci pocketing;Oral residue BSE Assessment Risk for Aspiration Impact on safety and function: Mild aspiration risk Other Related Risk Factors: Cognitive impairment  Short Term Goals: Week 1: SLP Short Term Goal 1 (Week 1): Patient will utilize swallowing compensatory strategies during consumption of D3/thin liquid diet given min multimodal A SLP Short Term Goal 2 (Week 1): Patient will demonstrate orientation to self, place, and situation  given max multimodal A SLP Short Term Goal 3 (Week 1): Patient will increase speech intelligibility to 80% at the phrase level given mod multimodal A SLP Short Term Goal 4 (Week 1): Patient will recall biographical information given max multimodal A SLP Short Term Goal 5 (Week 1): Patient will demonstrate focused attention during functional therapeutic tasks given max multimodal A SLP Short Term Goal 6 (Week 1): Patient will demonstrate awareness of current medical situation given max multimodal A  Refer to Care Plan for Long Term Goals  Recommendations for other services: None   Discharge Criteria: Patient will be discharged from SLP if patient refuses treatment 3 consecutive times without medical reason, if treatment goals not met, if there  is a change in medical status, if patient makes no progress towards goals or if patient is discharged from hospital.  The above assessment, treatment plan, treatment alternatives and goals were discussed and mutually agreed upon: by patient  Aiman Sonn M.A., CF-SLP 02/23/2023, 12:56 PM

## 2023-02-23 NOTE — Plan of Care (Signed)
  Problem: RH Swallowing Goal: LTG Patient will consume least restrictive diet using compensatory strategies with assistance (SLP) Description: LTG:  Patient will consume least restrictive diet using compensatory strategies with assistance (SLP) Flowsheets (Taken 02/23/2023 1249) LTG: Pt Patient will consume least restrictive diet using compensatory strategies with assistance of (SLP): Supervision   Problem: RH Cognition - SLP Goal: RH LTG Patient will demonstrate orientation with cues Description:  LTG:  Patient will demonstrate orientation to person/place/time/situation with cues (SLP)   Flowsheets (Taken 02/23/2023 1249) LTG Patient will demonstrate orientation to:  Place  Situation LTG: Patient will demonstrate orientation using cueing (SLP): Moderate Assistance - Patient 50 - 74%   Problem: RH Expression Communication Goal: LTG Patient will increase speech intelligibility (SLP) Description: LTG: Patient will increase speech intelligibility at word/phrase/conversation level with cues, % of the time (SLP) Flowsheets (Taken 02/23/2023 1249) LTG: Patient will increase speech intelligibility (SLP): Minimal Assistance - Patient > 75% Level: Phrase Percent of time patient will use intelligible speech: 90   Problem: RH Problem Solving Goal: LTG Patient will demonstrate problem solving for (SLP) Description: LTG:  Patient will demonstrate problem solving for basic/complex daily situations with cues  (SLP) Flowsheets (Taken 02/23/2023 1249) LTG: Patient will demonstrate problem solving for (SLP): Basic daily situations LTG Patient will demonstrate problem solving for: Moderate Assistance - Patient 50 - 74%   Problem: RH Memory Goal: LTG Patient will demonstrate ability for day to day (SLP) Description: LTG:   Patient will demonstrate ability for day to day recall/carryover during cognitive/linguistic activities with assist  (SLP) Flowsheets (Taken 02/23/2023 1249) LTG: Patient will  demonstrate ability for day to day recall: Biographical information LTG: Patient will demonstrate ability for day to day recall/carryover during cognitive/linguistic activities with assist (SLP): Moderate Assistance - Patient 50 - 74%   Problem: RH Attention Goal: LTG Patient will demonstrate this level of attention during functional activites (SLP) Description: LTG:  Patient will will demonstrate this level of attention during functional activites (SLP) Flowsheets (Taken 02/23/2023 1249) Patient will demonstrate during cognitive/linguistic activities the attention type of: Focused LTG: Patient will demonstrate this level of attention during cognitive/linguistic activities with assistance of (SLP): Moderate Assistance - Patient 50 - 74% Number of minutes patient will demonstrate attention during cognitive/linguistic activities: 10   Problem: RH Awareness Goal: LTG: Patient will demonstrate awareness during functional activites type of (SLP) Description: LTG: Patient will demonstrate awareness during functional activites type of (SLP) Flowsheets (Taken 02/23/2023 1249) Patient will demonstrate during cognitive/linguistic activities awareness type of: Intellectual LTG: Patient will demonstrate awareness during cognitive/linguistic activities with assistance of (SLP): Moderate Assistance - Patient 50 - 74%

## 2023-02-23 NOTE — Plan of Care (Signed)
  Problem: RH Balance Goal: LTG Patient will maintain dynamic standing with ADLs (OT) Description: LTG:  Patient will maintain dynamic standing balance with assist during activities of daily living (OT)  Flowsheets (Taken 02/23/2023 1344) LTG: Pt will maintain dynamic standing balance during ADLs with: Moderate Assistance - Patient 50 - 74%   Problem: Sit to Stand Goal: LTG:  Patient will perform sit to stand in prep for activites of daily living with assistance level (OT) Description: LTG:  Patient will perform sit to stand in prep for activites of daily living with assistance level (OT) Flowsheets (Taken 02/23/2023 1344) LTG: PT will perform sit to stand in prep for activites of daily living with assistance level: Minimal Assistance - Patient > 75%   Problem: RH Eating Goal: LTG Patient will perform eating w/assist, cues/equip (OT) Description: LTG: Patient will perform eating with assist, with/without cues using equipment (OT) Flowsheets (Taken 02/23/2023 1344) LTG: Pt will perform eating with assistance level of: Set up assist    Problem: RH Grooming Goal: LTG Patient will perform grooming w/assist,cues/equip (OT) Description: LTG: Patient will perform grooming with assist, with/without cues using equipment (OT) Flowsheets (Taken 02/23/2023 1344) LTG: Pt will perform grooming with assistance level of: Set up assist    Problem: RH Bathing Goal: LTG Patient will bathe all body parts with assist levels (OT) Description: LTG: Patient will bathe all body parts with assist levels (OT) Flowsheets (Taken 02/23/2023 1344) LTG: Pt will perform bathing with assistance level/cueing: Minimal Assistance - Patient > 75%   Problem: RH Dressing Goal: LTG Patient will perform upper body dressing (OT) Description: LTG Patient will perform upper body dressing with assist, with/without cues (OT). Flowsheets (Taken 02/23/2023 1344) LTG: Pt will perform upper body dressing with assistance level of: Moderate  Assistance - Patient 50 - 74% Goal: LTG Patient will perform lower body dressing w/assist (OT) Description: LTG: Patient will perform lower body dressing with assist, with/without cues in positioning using equipment (OT) Flowsheets (Taken 02/23/2023 1344) LTG: Pt will perform lower body dressing with assistance level of: Moderate Assistance - Patient 50 - 74%   Problem: RH Toileting Goal: LTG Patient will perform toileting task (3/3 steps) with assistance level (OT) Description: LTG: Patient will perform toileting task (3/3 steps) with assistance level (OT)  Flowsheets (Taken 02/23/2023 1344) LTG: Pt will perform toileting task (3/3 steps) with assistance level: Moderate Assistance - Patient 50 - 74%   Problem: RH Functional Use of Upper Extremity Goal: LTG Patient will use RT/LT upper extremity as a (OT) Description: LTG: Patient will use right/left upper extremity as a stabilizer/gross assist/diminished/nondominant/dominant level with assist, with/without cues during functional activity (OT) Flowsheets (Taken 02/23/2023 1344) LTG: Use of upper extremity in functional activities: RUE as diminished level LTG: Pt will use upper extremity in functional activity with assistance level of: Supervision/Verbal cueing   Problem: RH Toilet Transfers Goal: LTG Patient will perform toilet transfers w/assist (OT) Description: LTG: Patient will perform toilet transfers with assist, with/without cues using equipment (OT) Flowsheets (Taken 02/23/2023 1344) LTG: Pt will perform toilet transfers with assistance level of: Moderate Assistance - Patient 50 - 74%   Problem: RH Tub/Shower Transfers Goal: LTG Patient will perform tub/shower transfers w/assist (OT) Description: LTG: Patient will perform tub/shower transfers with assist, with/without cues using equipment (OT) Flowsheets (Taken 02/23/2023 1344) LTG: Pt will perform tub/shower stall transfers with assistance level of: Maximal Assistance - Patient 25 -  49%

## 2023-02-23 NOTE — Progress Notes (Signed)
Inpatient Rehabilitation  Patient information reviewed and entered into eRehab system by Oyuki Hogan M. Eri Mcevers, M.A., CCC/SLP, PPS Coordinator.  Information including medical coding, functional ability and quality indicators will be reviewed and updated through discharge.    

## 2023-02-23 NOTE — Plan of Care (Signed)
  Problem: Consults Goal: RH STROKE PATIENT EDUCATION Description: See Patient Education module for education specifics  Outcome: Progressing   Problem: RH BOWEL ELIMINATION Goal: RH STG MANAGE BOWEL WITH ASSISTANCE Description: STG Manage Bowel with toileting Assistance. Outcome: Progressing Goal: RH STG MANAGE BOWEL W/MEDICATION W/ASSISTANCE Description: STG Manage Bowel with Medication with mod I Assistance. Outcome: Progressing   Problem: RH BLADDER ELIMINATION Goal: RH STG MANAGE BLADDER WITH ASSISTANCE Description: STG Manage Bladder With toileting Assistance Outcome: Progressing   Problem: RH SKIN INTEGRITY Goal: RH STG SKIN FREE OF INFECTION/BREAKDOWN Description: Manage w min assist Outcome: Progressing Goal: RH STG MAINTAIN SKIN INTEGRITY WITH ASSISTANCE Description: STG Maintain Skin Integrity With min Assistance. Outcome: Progressing Goal: RH STG ABLE TO PERFORM INCISION/WOUND CARE W/ASSISTANCE Description: STG Able To Perform Incision/Wound Care With min Assistance. Outcome: Progressing   Problem: RH SAFETY Goal: RH STG ADHERE TO SAFETY PRECAUTIONS W/ASSISTANCE/DEVICE Description: STG Adhere to Safety Precautions With cues Assistance/Device. Outcome: Progressing   Problem: RH COGNITION-NURSING Goal: RH STG USES MEMORY AIDS/STRATEGIES W/ASSIST TO PROBLEM SOLVE Description: STG Uses Memory Aids/Strategies With cues Assistance to Problem Solve. Outcome: Progressing   Problem: RH PAIN MANAGEMENT Goal: RH STG PAIN MANAGED AT OR BELOW PT'S PAIN GOAL Description: <4 with prns Outcome: Progressing   Problem: RH KNOWLEDGE DEFICIT Goal: RH STG INCREASE KNOWLEDGE OF HYPERTENSION Description: Patient and family will be able to manage HTN with medications and dietary modifications using educational resources independently Outcome: Progressing Goal: RH STG INCREASE KNOWLEGDE OF HYPERLIPIDEMIA Description: Patient and family will be able to manage HLD with medications  and dietary modifications using educational resources independently Outcome: Progressing Goal: RH STG INCREASE KNOWLEDGE OF STROKE PROPHYLAXIS Description: Patient and family will be able to manage secondary risks with medications and dietary modifications using educational resources independently Outcome: Progressing

## 2023-02-23 NOTE — Progress Notes (Signed)
PROGRESS NOTE   Subjective/Complaints:  Pt awake, HOH, oriented to person only  Repetitive questions, no aphasia  ROS- limited by mental status   Objective:   DG Shoulder Right  Result Date: 02/22/2023 CLINICAL DATA:  Patient unable to move right arm.  Pain. EXAM: RIGHT SHOULDER - 2+ VIEW COMPARISON:  None Available. FINDINGS: There is no acute fracture or dislocation. The bones are osteopenic. Faint density along the inferior bony glenoid, chronic and likely degenerative. There is mild degenerative changes of the right AC joint. The soft tissues are unremarkable. IMPRESSION: 1. No acute fracture or dislocation. 2. Osteopenia and mild degenerative changes. Electronically Signed   By: Elgie Collard M.D.   On: 02/22/2023 18:52   DG Wrist Complete Right  Result Date: 02/22/2023 CLINICAL DATA:  Pain with decreased range of motion. EXAM: RIGHT WRIST - COMPLETE 3+ VIEW COMPARISON:  None Available. FINDINGS: There are degenerative change scratch set there is degenerative changes involving the radiocarpal joint. Mild increase caliber of the scapholunate interval which measures 5 mm. No signs of acute fracture or dislocation. IMPRESSION: 1. No acute findings. 2. Degenerative changes involving the radiocarpal joint. 3. Mild increase caliber of the scapholunate interval which may reflect scapholunate ligament injury. Electronically Signed   By: Signa Kell M.D.   On: 02/22/2023 18:51   DG Chest 2 View  Result Date: 02/22/2023 CLINICAL DATA:  Hypoxia EXAM: CHEST - 2 VIEW COMPARISON:  Chest x-ray 01/12/2023 FINDINGS: There are trace bilateral pleural effusions. There is no focal lung infiltrate or pneumothorax. The cardiomediastinal silhouette is within normal limits. No acute fractures are seen. IMPRESSION: Trace bilateral pleural effusions. Electronically Signed   By: Darliss Cheney M.D.   On: 02/22/2023 18:51   DG Abd 1 View  Result Date:  02/22/2023 CLINICAL DATA:  Bowel incontinence EXAM: ABDOMEN - 1 VIEW COMPARISON:  None Available. FINDINGS: Bowel gas pattern appears nonobstructed. No dilated loops of large or small bowel identified. No abnormal abdominal or pelvic calcifications identified. Previous right hip arthroplasty. IMPRESSION: Nonobstructive bowel gas pattern. Electronically Signed   By: Signa Kell M.D.   On: 02/22/2023 18:50   Recent Labs    02/23/23 0631  WBC 8.4  HGB 11.9*  HCT 37.5*  PLT 337   No results for input(s): "NA", "K", "CL", "CO2", "GLUCOSE", "BUN", "CREATININE", "CALCIUM" in the last 72 hours. No intake or output data in the 24 hours ending 02/23/23 0713   Pressure Injury 09/19/21 Coccyx Unstageable - Full thickness tissue loss in which the base of the injury is covered by slough (yellow, tan, gray, green or [place]) and/or eschar (tan, Bazzi or black) in the wound bed. oval shaped wound on coccyx with yello (Active)  09/19/21 1450  Location: Coccyx  Location Orientation:   Staging: Unstageable - Full thickness tissue loss in which the base of the injury is covered by slough (yellow, tan, gray, green or Carol) and/or eschar (tan, Pulice or black) in the wound bed.  Wound Description (Comments): oval shaped wound on coccyx with yellow slough  Present on Admission: Yes (Originally listed as stage 1 09/19/21; labeled as unstageable 02/22/23 on admit to CIR)    Physical Exam:  Vital Signs Blood pressure 139/70, pulse (!) 57, temperature 97.8 F (36.6 C), resp. rate 18, height 6' (1.829 m), weight 78 kg, SpO2 94%. Oriented to person only , repeats same questions (where's my wife?) General: No acute distress Mood and affect are appropriate Heart: Regular rate and rhythm no rubs murmurs or extra sounds Lungs: Clear to auscultation, breathing unlabored, no rales or wheezes Abdomen: Positive bowel sounds, soft nontender to palpation, nondistended Extremities: No clubbing, cyanosis, or edema Skin: No  evidence of breakdown, no evidence of rash Neurologic: Cranial nerves II through XII intact, motor strength is 5/5 in Left and 4/5 RIght deltoid, bicep, tricep, grip, 5/5 bilateral hip flexor, knee extensors, ankle dorsiflexor and plantar flexor Sensory exam limited by attention  Musculoskeletal: no pain with AROM in all 4 ext  No joint swelling    Assessment/Plan: 1. Functional deficits which require 3+ hours per day of interdisciplinary therapy in a comprehensive inpatient rehab setting. Physiatrist is providing close team supervision and 24 hour management of active medical problems listed below. Physiatrist and rehab team continue to assess barriers to discharge/monitor patient progress toward functional and medical goals  Care Tool:  Bathing              Bathing assist       Upper Body Dressing/Undressing Upper body dressing        Upper body assist      Lower Body Dressing/Undressing Lower body dressing            Lower body assist       Toileting Toileting    Toileting assist       Transfers Chair/bed transfer  Transfers assist           Locomotion Ambulation   Ambulation assist              Walk 10 feet activity   Assist           Walk 50 feet activity   Assist           Walk 150 feet activity   Assist           Walk 10 feet on uneven surface  activity   Assist           Wheelchair     Assist               Wheelchair 50 feet with 2 turns activity    Assist            Wheelchair 150 feet activity     Assist          Blood pressure 139/70, pulse (!) 57, temperature 97.8 F (36.6 C), resp. rate 18, height 6' (1.829 m), weight 78 kg, SpO2 94%.  Medical Problem List and Plan: 1. 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  Antithrombotics: -DVT/anticoagulation:  Pharmaceutical: Lovenox             -antiplatelet therapy: DAPT 3. Pain Management: Tylenol prn. Gabapentin 100 mg TID was added on 11/28 4. Mood/Behavior/Sleep:  LCSW to follow for evaluation and support.              -  antipsychotic agents: N/A--monitor for delirium/MS changes with recent stroke/change in environment insetting of baseline dementia. 5. Neuropsych/cognition: This patient may be intermittently capable of making decisions on his own behalf.             --family has noticed some disorientation and have been present 24/7 at [facility]. Unable to do that here             --also [facility] and wears bilateral hearing aids. 6. Skin/Wound Care:  Routine pressure relief pressure.  7. Fluids/Electrolytes/Nutrition: Monitor I/O. Check CMET in am             --Monitor I/O. Lasix was d/c yesterday for ??? 8. CAD/Chronic diastolic CHF: Followed by Dr. Allyson Sabal. HH diet with strict I/O. Daily weights             --Most recent PCI 12/29/2022 followed by admissions for fluid overload On Jardiance , Lopressor , aldactone              --was on Lasix 40 mg BID PTA-->d/c on 12/04 for ??. Monitor for signs of overload 9. Chronic respiratory failure w/nocturnal hypoxia: Since Sept--sleep study negative for OSA             --currently oxygen dependent with activity and intermittently during hospitalization --Encourage pulmonary hygiene. Will check CXR to rule out overload.              --Oxygen prn during the day and continue at nights.   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 Will check dopplers             Family asking pt not be told of dx just found out 02/21/23.  11. CKD 3a: Baseline SCr 1.4-1.5 per records              --recheck in am 12. Hx of anemia of chronic disease: Iron 34, Ferritin 1,002, Folate 7.9, B 12-524-->per 01/11/23 panel             --continue iron supplement 13. Hx of vitamin B 12 deficiency: On high doses of B12--will check level in am.  14. Chronic left knee pain: Monitor for symptoms with increase in activity/Right hemipareis 15. Hx major depressive d/o: Continue zoloft.  16. Hx prostate cancer- sounds like could be indolent:Has urgency/nocturia. Toilet every 4 hours. Check PVRs 17. Hx prediabetes: Hgb A 1c- 5.9 few months ago. Monitor fasting BS with serial labs.  18. R shoulder pain- no pain c/o or pain with R shoulder ROM this am , Xray with mild OA        LOS: 1 days A FACE TO FACE EVALUATION WAS PERFORMED  Erick Colace 02/23/2023, 7:13 AM

## 2023-02-24 DIAGNOSIS — I639 Cerebral infarction, unspecified: Secondary | ICD-10-CM | POA: Diagnosis not present

## 2023-02-24 NOTE — Progress Notes (Signed)
PROGRESS NOTE   Subjective/Complaints: No new complaints this morning Tolerated therapy well today Slept well last night Denies pain  ROS- limited by mental status, denies pain  Objective:   VAS [place] LOWER EXTREMITY VENOUS (DVT)  Result Date: 02/23/2023  Lower Venous DVT Study Patient Name:  Jeffrey Acevedo  Date of Exam:   02/23/2023 Medical Rec #: [id number]        Accession #:    [id number] Date of Birth: 1940/08/24        Patient Gender: M Patient Age:   82 years Exam Location:  [facility] Procedure:      VAS [place] LOWER EXTREMITY VENOUS (DVT) Referring Phys: PAMELA LOVE --------------------------------------------------------------------------------  Indications: Stroke. Other Indications: CHF. Comparison Study: Left lower extremity venous duplex on 10.16.2024. Performing Technologist: Fernande Bras  Examination Guidelines: A complete evaluation includes B-mode imaging, spectral Doppler, color Doppler, and power Doppler as needed of all accessible portions of each vessel. Bilateral testing is considered an integral part of a complete examination. Limited examinations for reoccurring indications may be performed as noted. The reflux portion of the exam is performed with the patient in reverse Trendelenburg.  +---------+---------------+---------+-----------+----------+--------------+ RIGHT    CompressibilityPhasicitySpontaneityPropertiesThrombus Aging +---------+---------------+---------+-----------+----------+--------------+ CFV      Full           Yes      Yes                                 +---------+---------------+---------+-----------+----------+--------------+ SFJ      Full                                                        +---------+---------------+---------+-----------+----------+--------------+ FV Prox  Full                                                         +---------+---------------+---------+-----------+----------+--------------+ FV Mid   Full                                                        +---------+---------------+---------+-----------+----------+--------------+ FV DistalFull                                                        +---------+---------------+---------+-----------+----------+--------------+ PFV      Full                                                        +---------+---------------+---------+-----------+----------+--------------+  POP      Full           Yes      Yes                                 +---------+---------------+---------+-----------+----------+--------------+ PTV      Full                                                        +---------+---------------+---------+-----------+----------+--------------+ PERO     Full                                                        +---------+---------------+---------+-----------+----------+--------------+ Fluid collection noted in distal thigh measuring 2 x 1 x 2.1 cm and in superior calf measuing 2.7 x2.4 x 3.3 cm with vascularity.  +---------+---------------+---------+-----------+----------+--------------+ LEFT     CompressibilityPhasicitySpontaneityPropertiesThrombus Aging +---------+---------------+---------+-----------+----------+--------------+ CFV      Full           Yes      Yes                                 +---------+---------------+---------+-----------+----------+--------------+ SFJ      Full                                                        +---------+---------------+---------+-----------+----------+--------------+ FV Prox  Full                                                        +---------+---------------+---------+-----------+----------+--------------+ FV Mid   Full                                                         +---------+---------------+---------+-----------+----------+--------------+ FV DistalFull                                                        +---------+---------------+---------+-----------+----------+--------------+ PFV      Full                                                        +---------+---------------+---------+-----------+----------+--------------+ POP      Full           Yes  Yes                                 +---------+---------------+---------+-----------+----------+--------------+ PTV      Full                                                        +---------+---------------+---------+-----------+----------+--------------+ PERO     Full                                                        +---------+---------------+---------+-----------+----------+--------------+     Summary: BILATERAL: - No evidence of deep vein thrombosis seen in the lower extremities, bilaterally. -No evidence of popliteal cyst, bilaterally.   *See table(s) above for measurements and observations. Electronically signed by Carolynn Sayers on 02/23/2023 at 4:54:16 PM.    Final    Recent Labs    02/23/23 0631  WBC 8.4  HGB 11.9*  HCT 37.5*  PLT 337   Recent Labs    02/23/23 0631  NA 132*  K 3.8  CL 100  CO2 22  GLUCOSE 96  BUN 22  CREATININE 1.12  CALCIUM 9.4    Intake/Output Summary (Last 24 hours) at 02/24/2023 1541 Last data filed at 02/24/2023 1330 Gross per 24 hour  Intake 438 ml  Output --  Net 438 ml     Pressure Injury 09/19/21 Coccyx Unstageable - Full thickness tissue loss in which the base of the injury is covered by slough (yellow, tan, gray, green or [place]baum) and/or eschar (tan, [place]astep or black) in the wound bed. oval shaped wound on coccyx with yello (Active)  09/19/21 1450  Location: Coccyx  Location Orientation:   Staging: Unstageable - Full thickness tissue loss in which the base of the injury is covered by slough (yellow, tan, gray, green or  [place]) and/or eschar (tan, [place]ir or black) in the wound bed.  Wound Description (Comments): oval shaped wound on coccyx with yellow slough  Present on Admission: Yes (Originally listed as stage 1 09/19/21; labeled as unstageable 02/22/23 on admit to CIR)    Physical Exam: Vital Signs Blood pressure 130/65, pulse 62, temperature 97.6 F (36.4 C), temperature source Oral, resp. rate 18, height 6' (1.829 m), weight 78 kg, SpO2 95%. Oriented to person only , repeats same questions (where's my wife?) General: No acute distress Mood and affect are appropriate Heart: Regular rate and rhythm no rubs murmurs or extra sounds Lungs: Clear to auscultation, breathing unlabored, no rales or wheezes Abdomen: Positive bowel sounds, soft nontender to palpation, nondistended Extremities: No clubbing, cyanosis, or edema Skin: No evidence of breakdown, no evidence of rash Neurologic: Cranial nerves II through XII intact, motor strength is 5/5 in Left and 4/5 RIght deltoid, bicep, tricep, grip, 5/5 bilateral hip flexor, knee extensors, ankle dorsiflexor and plantar flexor Sensory exam limited by attention, stable 12/7  Musculoskeletal: no pain with AROM in all 4 ext  No joint swelling    Assessment/Plan: 1. Functional deficits which require 3+ hours per day of interdisciplinary therapy in a comprehensive inpatient rehab setting. Physiatrist is providing close team supervision and 24 hour  management of active medical problems listed below. Physiatrist and rehab team continue to assess barriers to discharge/monitor patient progress toward functional and medical goals  Care Tool:  Bathing    Body parts bathed by patient: Chest, Abdomen, Face, Right arm   Body parts bathed by helper: Left arm, Front perineal area, Buttocks, Right upper leg, Left lower leg, Right lower leg, Left upper leg     Bathing assist Assist Level: Maximal Assistance - Patient 24 - 49%     Upper Body Dressing/Undressing Upper body  dressing   What is the patient wearing?: Pull over shirt    Upper body assist Assist Level: Maximal Assistance - Patient 25 - 49%    Lower Body Dressing/Undressing Lower body dressing      What is the patient wearing?: Underwear/pull up, Pants     Lower body assist Assist for lower body dressing: Dependent - Patient 0%     Toileting Toileting    Toileting assist Assist for toileting: 2 Helpers     Transfers Chair/bed transfer  Transfers assist     Chair/bed transfer assist level: Total Assistance - Patient < 25%     Locomotion Ambulation   Ambulation assist   Ambulation activity did not occur: Safety/medical concerns          Walk 10 feet activity   Assist  Walk 10 feet activity did not occur: Safety/medical concerns        Walk 50 feet activity   Assist Walk 50 feet with 2 turns activity did not occur: Safety/medical concerns         Walk 150 feet activity   Assist Walk 150 feet activity did not occur: Safety/medical concerns         Walk 10 feet on uneven surface  activity   Assist Walk 10 feet on uneven surfaces activity did not occur: Safety/medical concerns         Wheelchair     Assist Is the patient using a wheelchair?: Yes Type of Wheelchair: Manual    Wheelchair assist level: Dependent - Patient 0%      Wheelchair 50 feet with 2 turns activity    Assist        Assist Level: Dependent - Patient 0%   Wheelchair 150 feet activity     Assist      Assist Level: Dependent - Patient 0%   Blood pressure 130/65, pulse 62, temperature 97.6 F (36.4 C), temperature source Oral, resp. rate 18, height 6' (1.829 m), weight 78 kg, SpO2 95%.  Medical Problem List and Plan: 1. 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  Antithrombotics: -DVT/anticoagulation:  Pharmaceutical: Lovenox             -antiplatelet therapy: DAPT 3. Pain Management: Tylenol prn. Gabapentin 100 mg TID was added on 11/28 4. Mood/Behavior/Sleep:  LCSW to follow for evaluation and support.              -antipsychotic agents: N/A--monitor for delirium/MS changes with recent stroke/change in environment insetting of baseline dementia. 5. Neuropsych/cognition: This patient may be intermittently capable of making decisions on his  own behalf.             --family has noticed some disorientation and have been present 24/7 at [facility] Unable to do that here             --also [facility] and wears bilateral hearing aids. 6. Skin/Wound Care:  Routine pressure relief pressure.   7. Fluids/Electrolytes/Nutrition: Monitor I/O. Check CMET in am             --Monitor I/O. Lasix was d/c yesterday for ???  8. CAD/Chronic diastolic CHF: Followed by Dr. Allyson Sabal. HH diet with strict I/O. Daily weights             --Most recent PCI 12/29/2022 followed by admissions for fluid overload Continue Jardiance , Lopressor , aldactone              --was on Lasix 40 mg BID PTA-->d/c on 12/04 for ??. Monitor for signs of overload  9. Chronic respiratory failure w/nocturnal hypoxia: Since Sept--sleep study negative for OSA             --currently oxygen dependent with activity and intermittently during hospitalization --Encourage pulmonary hygiene. Will check CXR to rule out overload.              --Oxygen prn during the day and continue at nights.    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 Will check dopplers             Family asking pt not be told of dx just found out 02/21/23.   11. CKD 3a: Baseline SCr 1.4-1.5 per records             --monitor weekly  12. Hx of anemia of  chronic disease: Iron 34, Ferritin 1,002, Folate 7.9, B 12-524-->per 01/11/23 panel             --continue ron supplement  13. Hx of vitamin B 12 deficiency: On high doses of B12--will check level in am.   14. Chronic left knee pain: Monitor for symptoms with increase in activity/Right hemipareis  15. Hx major depressive d/o: continue zoloft.   16. Hx prostate cancer- sounds like could be indolent:Has urgency/nocturia. Toilet every 4 hours. Check PVRs  17. Hx prediabetes: Hgb A 1c- 5.9 few months ago. Monitor fasting BS with serial labs.   18. R shoulder pain- no pain c/o or pain with R shoulder ROM this am , Xray with mild OA        LOS: 2 days A FACE TO FACE EVALUATION WAS PERFORMED  Clint Bolder P Anis Cinelli 02/24/2023, 3:41 PM

## 2023-02-24 NOTE — Progress Notes (Signed)
Speech Language Pathology Daily Session Note  Patient Details  Name: Jeffrey Acevedo MRN: [id number] Date of Birth: 07/20/1940  Today's Date: 02/24/2023 SLP Individual Time: 1030-1130 SLP Individual Time Calculation (min): 60 min  Short Term Goals: Week 1: SLP Short Term Goal 1 (Week 1): Patient will utilize swallowing compensatory strategies during consumption of D3/thin liquid diet given min multimodal A SLP Short Term Goal 2 (Week 1): Patient will demonstrate orientation to self, place, and situation given max multimodal A SLP Short Term Goal 3 (Week 1): Patient will increase speech intelligibility to 80% at the phrase level given mod multimodal A SLP Short Term Goal 4 (Week 1): Patient will recall biographical information given max multimodal A SLP Short Term Goal 5 (Week 1): Patient will demonstrate focused attention during functional therapeutic tasks given max multimodal A SLP Short Term Goal 6 (Week 1): Patient will demonstrate awareness of current medical situation given max multimodal A  Skilled Therapeutic Interventions: Upon entrance, patient asleep in bed though aroused by verbal and tactile stimulation. Skilled therapy session focused on cognitive goals. SLP faciliated session by targeting orientation questions. Patient able to independently recall name, place and situation and required modA to utilize calendar to identify time. SLP introduced memory book and reasoning behind use. Patient required mod-maxA to utilize todays schedule in order to create journal entry for todays activities. Patient unable to recall activities completed in OT this AM. SLP addressed problem solving through providing mod-maxA during identification of problem/solution in picture cards. Patient expressed feeling overwhelmed during rehab process and SLP provided necessary encouragement and explaination. Patient left in bed with alarm set and call bell in reach. Continue POC  Pain Denies  Therapy/Group:  Individual Therapy  Eural Holzschuh M.A., CF-SLP 02/24/2023, 7:42 AM

## 2023-02-24 NOTE — Progress Notes (Signed)
Physical Therapy Session Note  Patient Details  Name: Jeffrey Acevedo MRN: [id number] Date of Birth: 02-09-1941  Today's Date: 02/24/2023 PT Individual Time: 1315-1408 PT Individual Time Calculation (min): 53 min   Short Term Goals: Week 1:  PT Short Term Goal 1 (Week 1): Pt will transfer sup <> sit w/ mod A consistently. PT Short Term Goal 2 (Week 1): Pt will transfer sit to stand w/ max A. PT Short Term Goal 3 (Week 1): Pt will maintain seated balance > 5' at EOB. PT Short Term Goal 4 (Week 1): Pt will transfer SPT w/ max A +1. PT Short Term Goal 5 (Week 1): PT to assess gait.  Skilled Therapeutic Interventions/Progress Updates: Patient supine in bed eating lunch with nsg on entrance to room. Patient alert and agreeable to PT session after finishing meal. PTA arrived shortly after at indicated time.  Patient reported no pain. Beginning of session focused on building pt rapport with PTA on R side of pt to bring awareness to affected side (pt required min cue to look over to converse).   Therapeutic Activity: Bed Mobility: Pt performed supine<>sit on EOB with maxA +2 for safety. VC required for flexing at R knee in order to bridge hips over to L sidelying. Pt also cued to use L UE to bring R UE across torso with manual facilitation of bringing L UE over to R UE due to pt decrease in awareness of R side. Pt then required maxA to advance B LE's off of bed, and maxA to elevate trunk with VC to use L UE to assist in elevation.  Transfers: Pt performed sit<>stand transfer via STEDY from EOB (air mattress) with VC to bring UE's on front of STEDY bar (increased time to flex extremities). Pt then modA+2 to stand to STEDY. Pt sat to [facility] with heavy modA to control descent with increased target cues to forward weight shift in order to posteriorly scoot in WC with heavy maxA. Pt performed stand pivot + 2 heavy maxA from [employer] of hi/low mat in main gym (VC to advance B LE's accordingly with pt taking small  slide/steps - demos retro step with R LE when cued - pt also with heavy lean to R while standing). Pt transferred in STEDY at end of session from hi/low mat<WC with heavy maxA (possibly due to fatigue following sitting balance edge of mat intervention) - pt also attempted to squat down when on STEDY pads when transferring to [facility] with pushing on B LE's despite VC to stand and pull (pt required increased time/effort to follow commands). Pt then from WC<EOB with same level of assistance and VC at beginning of session (seated rest with back support).   Neuromuscular Re-ed: NMR facilitated during session with focus on static/dynamic sitting balance. - Pt sitting edge of mat performing forward weight shifting while crossing midline with L or R UE (maxA+ 2 for sitting balance). Pt presented with requiring increased time and effort to reach for PTA hand. Pt with max multimodal cues to keep L UE on lap in supination in order to avoid pushing to posteriorly.  - Pt cued to maintain static sitting balance with mirror feedback also provided. Pt self correcting with minA, but unable to maintain for short period. Pt with heavy mod/maxA to maintain when starting to push with L UE (unable to keep LE UE in lap for more than a few seconds before back on mat and wanting to push away from midline).   PASS performed  this session with pt scoring 6/36  NMR performed for improvements in motor control and coordination, balance, sequencing, judgement, and self confidence/ efficacy in performing all aspects of mobility at highest level of independence.   Patient supine in bed at end of session with brakes locked, bed alarm set, and all needs within reach.      Therapy Documentation Precautions:  Precautions Precautions: Fall, Other (comment) Precaution Comments: strong posterior and pusher Restrictions Weight Bearing Restrictions: No  Therapy/Group: Individual Therapy  Danille Oppedisano PTA 02/24/2023, 3:20 PM

## 2023-02-24 NOTE — Progress Notes (Signed)
Slept well last night after dose of melatonin given. Appeared confused before then and possible sundowning. Not easily directable during such episodes. Denies pain.  Remains incontinent of bladder. VS stable. Tele-monitoring ongoing. Safety ensured.

## 2023-02-24 NOTE — Progress Notes (Signed)
Occupational Therapy Session Note  Patient Details  Name: Jeffrey Acevedo MRN: [id number] Date of Birth: 1940/08/01  Today's Date: 02/24/2023 OT Individual Time: 4098-1191 OT Individual Time Calculation (min): 73 min    Short Term Goals: Week 1:  OT Short Term Goal 1 (Week 1): to increase safety to be able to sit on a toilet, pt will maintain balance at EOB with CGA for at least 10 minutes. OT Short Term Goal 2 (Week 1): Pt will demonstrate improved motor planning of LUE to use L hand to help don shirt with mod A. OT Short Term Goal 3 (Week 1): Pt will use R hand to wash L arm with min A. OT Short Term Goal 4 (Week 1): pt will rise to stand in stedy lift with mod A of 1 5x in a row to ensure he can safely use stedy lift for toilet transfers.   Skilled Therapeutic Interventions/Progress Updates:    Pt bed level at time of session, sleeping but arousable with tactile cues (HOH). No pain reported throughout session. Pt stating he needed to use the bathroom, and max cues for remaining in bed until stedy present, supine > sit with HOB elevated with MOD A and scooting to EOB same manner. Sit <> stand in stedy with MIN A throughout session with extended time and MAX cues for hand placement, 2nd helper present throughout as pt had progressive R lean in stedy. Total A for clothing management in bathroom, unable to void despite extended time. Stedy > wheelchair with again R lean present and progressive during transfer. LB dressing MAX/total A in stedy with hand over hand to use RUE. Set up in wheelchair RUE elevated, providing MOD A overall for self feeding using R hand, OT assisting with spearing food, cues for chewing, etc but could be as good as Supervision with L hand. Set up all needs met, alarm on, NT to take over supervision for breakfast.  Therapy Documentation Precautions:  Precautions Precautions: Fall, Other (comment) Precaution Comments: strong posterior and pusher Restrictions Weight  Bearing Restrictions: No     Therapy/Group: Individual Therapy  Erasmo Score 02/24/2023, 7:26 AM

## 2023-02-25 DIAGNOSIS — I639 Cerebral infarction, unspecified: Secondary | ICD-10-CM | POA: Diagnosis not present

## 2023-02-25 NOTE — IPOC Note (Signed)
Overall Plan of Care [facility]) Patient Details Name: Jeffrey Acevedo MRN: [id number] DOB: 1940/10/22  Admitting Diagnosis: Stroke (cerebrum) [facility])  Hospital Problems: Principal Problem:   Stroke (cerebrum) [facility])     Functional Problem List: Nursing Bladder, Bowel, Pain, Safety, Endurance, Skin Integrity, Medication Management  PT Balance, Perception, Safety, Endurance, Motor  OT Balance, Cognition, Endurance, Motor, Perception, Vision, Sensory  SLP Cognition, Nutrition, Linguistic, Safety  TR         Basic ADL's: OT Eating, Grooming, Bathing, Dressing, Toileting     Advanced  ADL's: OT       Transfers: PT Bed Mobility, Bed to Chair, Car, Occupational psychologist, Research scientist (life sciences): PT Ambulation, Psychologist, prison and probation services, Stairs     Additional Impairments: OT Fuctional Use of Upper Extremity  SLP Swallowing, Communication, Social Cognition comprehension (questionable vs cognition) Problem Solving, Memory, Attention, Awareness  TR      Anticipated Outcomes Item Anticipated Outcome  Self Feeding set up  Swallowing  supervision A   Basic self-care  mod A overall  Toileting  mod A   Bathroom Transfers mod A to toilet, max A to shower  Bowel/Bladder  manage bowel w mod I and bladder w toileting assist  Transfers  Min A bed mobility to sitting, Mod A standing and SPT.  Locomotion  Mod A w/ LRAD  Communication  minA  Cognition  modA  Pain  < 4 with prns  Safety/Judgment  manage w cues   Therapy Plan: PT Intensity: Minimum of 1-2 x/day ,45 to 90 minutes PT Frequency: 5 out of 7 days PT Duration Estimated Length of Stay: 3 weeks. OT Intensity: Minimum of 1-2 x/day, 45 to 90 minutes OT Frequency: 5 out of 7 days OT Duration/Estimated Length of Stay: 20-21 days SLP Intensity: Minumum of 1-2 x/day, 30 to 90 minutes SLP Frequency: 3 to 5 out of 7 days SLP Duration/Estimated Length of Stay: 3 weeks   Team Interventions: Nursing Interventions Patient/Family  Education, Disease Management/Prevention, Bladder Management, Pain Management, Skin Care/Wound Management, Discharge Planning, Bowel Management, Medication Management  PT interventions Ambulation/gait training, Community reintegration, Neuromuscular re-education, Stair training, UE/LE Strength taining/ROM, Wheelchair propulsion/positioning, Discharge planning, Warden/ranger, Therapeutic Activities, UE/LE Coordination activities, Therapeutic Exercise, Patient/family education, Functional mobility training  OT Interventions Balance/vestibular training, Cognitive remediation/compensation, DME/adaptive equipment instruction, Discharge planning, Functional mobility training, Neuromuscular re-education, Patient/family education, Pain management, Self Care/advanced ADL retraining, UE/LE Strength taining/ROM, Therapeutic Exercise, Therapeutic Activities, UE/LE Coordination activities, Visual/perceptual remediation/compensation  SLP Interventions Cognitive remediation/compensation, Dysphagia/aspiration precaution training, Internal/external aids, Speech/Language facilitation, Cueing hierarchy, Therapeutic Activities, Functional tasks, Multimodal communication approach, Patient/family education, Therapeutic Exercise  TR Interventions    SW/CM Interventions Discharge Planning, Psychosocial Support, Patient/Family Education, Disease Management/Prevention   Barriers to Discharge MD  Medical stability  Nursing Home environment access/layout 1 level 3 ste w daughter; personal care attendant 9-5p daily at daughter's home  PT Inaccessible home environment, Home environment access/layout access to home, may be going to dtr's home at D/C  OT      SLP      SW Lack of/limited family support, Decreased caregiver support, Community education officer for SNF coverage     Team Discharge Planning: Destination: PT-Home ,OT- Home , SLP-Home Projected Follow-up: PT-Home health PT, OT-  Home health OT, SLP-Outpatient SLP, Home  Health SLP, 24 hour supervision/assistance Projected Equipment Needs: PT-To be determined, OT- To be determined, SLP-None recommended by SLP Equipment Details: PT- , OT-  Patient/family involved in discharge planning: PT- Patient,  OT-Patient,  SLP-Patient  MD ELOS: 14-18 days Medical Rehab Prognosis:  Excellent Assessment: The patient has been admitted for CIR therapies with the diagnosis of b/l cerebral strokes. The team will be addressing functional mobility, strength, stamina, balance, safety, adaptive techniques and equipment, self-care, bowel and bladder mgt, patient and caregiver education. Goals have been set at [employer]. Anticipated discharge destination is home.        See Team Conference Notes for weekly updates to the plan of care

## 2023-02-25 NOTE — Progress Notes (Signed)
Occupational Therapy Session Note  Patient Details  Name: Jeffrey Acevedo MRN: [id number] Date of Birth: 1940-08-13  Today's Date: 02/25/2023 OT Individual Time: 0820-0907 OT Individual Time Calculation (min): 47 min    Short Term Goals: Week 1:  OT Short Term Goal 1 (Week 1): to increase safety to be able to sit on a toilet, pt will maintain balance at EOB with CGA for at least 10 minutes. OT Short Term Goal 2 (Week 1): Pt will demonstrate improved motor planning of LUE to use L hand to help don shirt with mod A. OT Short Term Goal 3 (Week 1): Pt will use R hand to wash L arm with min A. OT Short Term Goal 4 (Week 1): pt will rise to stand in stedy lift with mod A of 1 5x in a row to ensure he can safely use stedy lift for toilet transfers.  Skilled Therapeutic Interventions/Progress Updates:  Pt greeted supine in bed with NT present assisting pt with self feeding, pt agreeable to OT intervention.      Transfers/bed mobility: pt completed supine>sit with MOD A +2, assist to maneuver BLEs to EOB and to elevate trunk. Pt able to stand to stedy with MIN A+2. Dependent transfer into w/c.  ADLs:  Grooming: pt completed seated grooming tasks with MAX A. Pt seems to have midline shift as pt having difficulty locating warm water nozzle on L side even when L hand was placed on nozzle. Emphasis on bimanual tasks with hand over hand assist provided for hair brushing.  UB dressing:pt donned [place] shirt from EOB with MODA, assist needed to thread RUE into shirt but pt able to initiate remainder of task with MIN A cues.  LB dressing: donned pants from bed level with MAX A, pt able to bridge hips while therapist and tech pulled pants to waist line.  Footwear: donned shoes with total A for time mgmt.   Self feeding:pt completed self feeding with built up utensil in RUE, pt needed MOD support at R elbow and support at R wrist however pt was able to maintain grasp on built up utensil. Assisted needed for motor  planning during hand/mouth pattern.   General: donned hearing aids in both ears at start of sessio, recommended to nurse that pt only stay up in w/c for one hour.   Ended session with pt seated in w/c with all needs within reach and safety alarm activated.                    Therapy Documentation Precautions:  Precautions Precautions: Fall, Other (comment) Precaution Comments: strong posterior and pusher Restrictions Weight Bearing Restrictions: No  Pain: No pain reported      Therapy/Group: Individual Therapy  Pollyann Glen [facility] 02/25/2023, 12:11 PM

## 2023-02-25 NOTE — Plan of Care (Signed)
  Problem: Consults Goal: RH STROKE PATIENT EDUCATION Description: See Patient Education module for education specifics  Outcome: Progressing   Problem: RH BOWEL ELIMINATION Goal: RH STG MANAGE BOWEL WITH ASSISTANCE Description: STG Manage Bowel with toileting Assistance. Outcome: Progressing Goal: RH STG MANAGE BOWEL W/MEDICATION W/ASSISTANCE Description: STG Manage Bowel with Medication with mod I Assistance. Outcome: Progressing   Problem: RH BLADDER ELIMINATION Goal: RH STG MANAGE BLADDER WITH ASSISTANCE Description: STG Manage Bladder With toileting Assistance Outcome: Progressing   Problem: RH SKIN INTEGRITY Goal: RH STG SKIN FREE OF INFECTION/BREAKDOWN Description: Manage w min assist Outcome: Progressing Goal: RH STG MAINTAIN SKIN INTEGRITY WITH ASSISTANCE Description: STG Maintain Skin Integrity With min Assistance. Outcome: Progressing Goal: RH STG ABLE TO PERFORM INCISION/WOUND CARE W/ASSISTANCE Description: STG Able To Perform Incision/Wound Care With min Assistance. Outcome: Progressing   Problem: RH SAFETY Goal: RH STG ADHERE TO SAFETY PRECAUTIONS W/ASSISTANCE/DEVICE Description: STG Adhere to Safety Precautions With cues Assistance/Device. Outcome: Progressing   Problem: RH COGNITION-NURSING Goal: RH STG USES MEMORY AIDS/STRATEGIES W/ASSIST TO PROBLEM SOLVE Description: STG Uses Memory Aids/Strategies With cues Assistance to Problem Solve. Outcome: Progressing   Problem: RH PAIN MANAGEMENT Goal: RH STG PAIN MANAGED AT OR BELOW PT'S PAIN GOAL Description: <4 with prns Outcome: Progressing   Problem: RH KNOWLEDGE DEFICIT Goal: RH STG INCREASE KNOWLEDGE OF HYPERTENSION Description: Patient and family will be able to manage HTN with medications and dietary modifications using educational resources independently Outcome: Progressing Goal: RH STG INCREASE KNOWLEGDE OF HYPERLIPIDEMIA Description: Patient and family will be able to manage HLD with medications  and dietary modifications using educational resources independently Outcome: Progressing Goal: RH STG INCREASE KNOWLEDGE OF STROKE PROPHYLAXIS Description: Patient and family will be able to manage secondary risks with medications and dietary modifications using educational resources independently Outcome: Progressing

## 2023-02-25 NOTE — Progress Notes (Signed)
PROGRESS NOTE   Subjective/Complaints: No new complaints this morning Patient's chart reviewed- No issues reported overnight Vitals signs stable   ROS- limited by mental status, denies pain  Objective:   VAS [place] LOWER EXTREMITY VENOUS (DVT)  Result Date: 02/23/2023  Lower Venous DVT Study Patient Name:  Jeffrey Acevedo  Date of Exam:   02/23/2023 Medical Rec #: [id number]        Accession #:    [id number] Date of Birth: 10/02/1940        Patient Gender: M Patient Age:   82 years Exam Location:  [facility] Procedure:      VAS [place] LOWER EXTREMITY VENOUS (DVT) Referring Phys: PAMELA LOVE --------------------------------------------------------------------------------  Indications: Stroke. Other Indications: CHF. Comparison Study: Left lower extremity venous duplex on 10.16.2024. Performing Technologist: Fernande Bras  Examination Guidelines: A complete evaluation includes B-mode imaging, spectral Doppler, color Doppler, and power Doppler as needed of all accessible portions of each vessel. Bilateral testing is considered an integral part of a complete examination. Limited examinations for reoccurring indications may be performed as noted. The reflux portion of the exam is performed with the patient in reverse Trendelenburg.  +---------+---------------+---------+-----------+----------+--------------+ RIGHT    CompressibilityPhasicitySpontaneityPropertiesThrombus Aging +---------+---------------+---------+-----------+----------+--------------+ CFV      Full           Yes      Yes                                 +---------+---------------+---------+-----------+----------+--------------+ SFJ      Full                                                        +---------+---------------+---------+-----------+----------+--------------+ FV Prox  Full                                                         +---------+---------------+---------+-----------+----------+--------------+ FV Mid   Full                                                        +---------+---------------+---------+-----------+----------+--------------+ FV DistalFull                                                        +---------+---------------+---------+-----------+----------+--------------+ PFV      Full                                                        +---------+---------------+---------+-----------+----------+--------------+  POP      Full           Yes      Yes                                 +---------+---------------+---------+-----------+----------+--------------+ PTV      Full                                                        +---------+---------------+---------+-----------+----------+--------------+ PERO     Full                                                        +---------+---------------+---------+-----------+----------+--------------+ Fluid collection noted in distal thigh measuring 2 x 1 x 2.1 cm and in superior calf measuing 2.7 x2.4 x 3.3 cm with vascularity.  +---------+---------------+---------+-----------+----------+--------------+ LEFT     CompressibilityPhasicitySpontaneityPropertiesThrombus Aging +---------+---------------+---------+-----------+----------+--------------+ CFV      Full           Yes      Yes                                 +---------+---------------+---------+-----------+----------+--------------+ SFJ      Full                                                        +---------+---------------+---------+-----------+----------+--------------+ FV Prox  Full                                                        +---------+---------------+---------+-----------+----------+--------------+ FV Mid   Full                                                         +---------+---------------+---------+-----------+----------+--------------+ FV DistalFull                                                        +---------+---------------+---------+-----------+----------+--------------+ PFV      Full                                                        +---------+---------------+---------+-----------+----------+--------------+ POP      Full           Yes  Yes                                 +---------+---------------+---------+-----------+----------+--------------+ PTV      Full                                                        +---------+---------------+---------+-----------+----------+--------------+ PERO     Full                                                        +---------+---------------+---------+-----------+----------+--------------+     Summary: BILATERAL: - No evidence of deep vein thrombosis seen in the lower extremities, bilaterally. -No evidence of popliteal cyst, bilaterally.   *See table(s) above for measurements and observations. Electronically signed by Carolynn Sayers on 02/23/2023 at 4:54:16 PM.    Final    Recent Labs    02/23/23 0631  WBC 8.4  HGB 11.9*  HCT 37.5*  PLT 337   Recent Labs    02/23/23 0631  NA 132*  K 3.8  CL 100  CO2 22  GLUCOSE 96  BUN 22  CREATININE 1.12  CALCIUM 9.4    Intake/Output Summary (Last 24 hours) at 02/25/2023 1133 Last data filed at 02/25/2023 0800 Gross per 24 hour  Intake 238 ml  Output 250 ml  Net -12 ml     Pressure Injury 09/19/21 Coccyx Unstageable - Full thickness tissue loss in which the base of the injury is covered by slough (yellow, tan, gray, green or [place]) and/or eschar (tan, Benda or black) in the wound bed. oval shaped wound on coccyx with yello (Active)  09/19/21 1450  Location: Coccyx  Location Orientation:   Staging: Unstageable - Full thickness tissue loss in which the base of the injury is covered by slough (yellow, tan, gray, green or  Keel) and/or eschar (tan, [place]eltoro or black) in the wound bed.  Wound Description (Comments): oval shaped wound on coccyx with yellow slough  Present on Admission: Yes (Originally listed as stage 1 09/19/21; labeled as unstageable 02/22/23 on admit to CIR)    Physical Exam: Vital Signs Blood pressure 133/72, pulse (!) 55, temperature (!) 97.4 F (36.3 C), resp. rate 16, height 6' (1.829 m), weight 78.1 kg, SpO2 97%. Oriented to person only , repeats same questions (where's my wife?) General: No acute distress Mood and affect are appropriate Heart: Bradycardic Lungs: Clear to auscultation, breathing unlabored, no rales or wheezes Abdomen: Positive bowel sounds, soft nontender to palpation, nondistended Extremities: No clubbing, cyanosis, or edema Skin: No evidence of breakdown, no evidence of rash Neurologic: Cranial nerves II through XII intact, motor strength is 5/5 in Left and 4/5 RIght deltoid, bicep, tricep, grip, 5/5 bilateral hip flexor, knee extensors, ankle dorsiflexor and plantar flexor Sensory exam limited by attention, stable 12/7  Musculoskeletal: no pain with AROM in all 4 ext  No joint swelling    Assessment/Plan: 1. Functional deficits which require 3+ hours per day of interdisciplinary therapy in a comprehensive inpatient rehab setting. Physiatrist is providing close team supervision and 24 hour management of active medical problems listed below. Physiatrist and  rehab team continue to assess barriers to discharge/monitor patient progress toward functional and medical goals  Care Tool:  Bathing    Body parts bathed by patient: Chest, Abdomen, Face, Right arm   Body parts bathed by helper: Left arm, Front perineal area, Buttocks, Right upper leg, Left lower leg, Right lower leg, Left upper leg     Bathing assist Assist Level: Maximal Assistance - Patient 24 - 49%     Upper Body Dressing/Undressing Upper body dressing   What is the patient wearing?: Pull over shirt     Upper body assist Assist Level: Maximal Assistance - Patient 25 - 49%    Lower Body Dressing/Undressing Lower body dressing      What is the patient wearing?: Underwear/pull up, Pants     Lower body assist Assist for lower body dressing: Dependent - Patient 0%     Toileting Toileting    Toileting assist Assist for toileting: 2 Helpers     Transfers Chair/bed transfer  Transfers assist     Chair/bed transfer assist level: Total Assistance - Patient < 25%     Locomotion Ambulation   Ambulation assist   Ambulation activity did not occur: Safety/medical concerns          Walk 10 feet activity   Assist  Walk 10 feet activity did not occur: Safety/medical concerns        Walk 50 feet activity   Assist Walk 50 feet with 2 turns activity did not occur: Safety/medical concerns         Walk 150 feet activity   Assist Walk 150 feet activity did not occur: Safety/medical concerns         Walk 10 feet on uneven surface  activity   Assist Walk 10 feet on uneven surfaces activity did not occur: Safety/medical concerns         Wheelchair     Assist Is the patient using a wheelchair?: Yes Type of Wheelchair: Manual    Wheelchair assist level: Dependent - Patient 0%      Wheelchair 50 feet with 2 turns activity    Assist        Assist Level: Dependent - Patient 0%   Wheelchair 150 feet activity     Assist      Assist Level: Dependent - Patient 0%   Blood pressure 133/72, pulse (!) 55, temperature (!) 97.4 F (36.3 C), resp. rate 16, height 6' (1.829 m), weight 78.1 kg, SpO2 97%.  Medical Problem List and Plan: 1. 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  Antithrombotics: -DVT/anticoagulation:   Pharmaceutical: Lovenox             -antiplatelet therapy: DAPT 3. Pain Management: Tylenol prn. Gabapentin 100 mg TID was added on 11/28 4. Mood/Behavior/Sleep:  LCSW to follow for evaluation and support.              -antipsychotic agents: N/A--monitor for delirium/MS changes with recent stroke/change in environment insetting of baseline dementia. 5. Neuropsych/cognition: This patient may be intermittently capable of making decisions on his own behalf.             --  family has noticed some disorientation and have been present 24/7 at [facility]. Unable to do that here             --also [facility] and wears bilateral hearing aids. 6. Skin/Wound Care:  Routine pressure relief pressure.   7. Fluids/Electrolytes/Nutrition: Monitor I/O. Check CMET in am             --Monitor I/O. Lasix was d/c yesterday for ???  8. CAD/Chronic diastolic CHF: Followed by Dr. Allyson Sabal. HH diet with strict I/O. Daily weights             --Most recent PCI 12/29/2022 followed by admissions for fluid overload Continue Jardiance , Lopressor , aldactone              --was on Lasix 40 mg BID PTA-->d/c on 12/04 for ??. Monitor for signs of overload  9. Chronic respiratory failure w/nocturnal hypoxia: Since Sept--sleep study negative for OSA             --currently oxygen dependent with activity and intermittently during hospitalization --Encourage pulmonary hygiene. Will check CXR to rule out overload.              --Oxygen prn during the day and continue at nights.    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 Will check dopplers             Family asking pt not be told of dx just found out 02/21/23.   11. CKD 3a: Baseline SCr 1.4-1.5 per records             --monitor weekly  12. Hx of anemia of chronic disease: Iron 34, Ferritin 1,002, Folate 7.9, B  12-524-->per 01/11/23 panel             --continue iron supplement  13. Hx of vitamin B 12 deficiency: Continue high doses of B12   14. Chronic left knee pain: Monitor for symptoms with increase in activity/Right hemipareis  15. Hx major depressive d/o: continue zoloft.   16. Hx prostate cancer- sounds like could be indolent:Has urgency/nocturia. Toilet every 4 hours. Continue to check PVRs  17. Hx prediabetes: Hgb A 1c- 5.9 few months ago. Monitor fasting BS with serial labs.   18. R shoulder pain- no pain c/o or pain with R shoulder ROM this am , Xray with mild OA        LOS: 3 days A FACE TO FACE EVALUATION WAS PERFORMED  Clint Bolder P Lashann Hagg 02/25/2023, 11:33 AM

## 2023-02-26 ENCOUNTER — Telehealth: Payer: Self-pay | Admitting: Cardiovascular Disease

## 2023-02-26 DIAGNOSIS — F01B4 Vascular dementia, moderate, with anxiety: Secondary | ICD-10-CM | POA: Diagnosis not present

## 2023-02-26 DIAGNOSIS — I509 Heart failure, unspecified: Secondary | ICD-10-CM | POA: Diagnosis not present

## 2023-02-26 DIAGNOSIS — C8338 Diffuse large B-cell lymphoma, lymph nodes of multiple sites: Secondary | ICD-10-CM | POA: Diagnosis not present

## 2023-02-26 DIAGNOSIS — I639 Cerebral infarction, unspecified: Secondary | ICD-10-CM | POA: Diagnosis not present

## 2023-02-26 LAB — URINALYSIS, ROUTINE W REFLEX MICROSCOPIC
Bilirubin Urine: NEGATIVE
Glucose, UA: >=500 mg/dL — AB
Hgb urine dipstick: NEGATIVE
Ketones, ur: NEGATIVE mg/dL
Leukocytes,Ua: NEGATIVE
Nitrite: NEGATIVE
Protein, ur: NEGATIVE mg/dL
Specific Gravity, Urine: 1.018 (ref 1.005–1.030)
pH: 5 (ref 5.0–8.0)

## 2023-02-26 LAB — BASIC METABOLIC PANEL
Anion gap: 8 (ref 5–15)
BUN: 22 mg/dL (ref 8–23)
CO2: 24 mmol/L (ref 22–32)
Calcium: 9.1 mg/dL (ref 8.9–10.3)
Chloride: 101 mmol/L (ref 98–111)
Creatinine, Ser: 1.21 mg/dL (ref 0.61–1.24)
GFR, Estimated: >60 mL/min (ref >60)
Glucose, Bld: 92 mg/dL (ref 70–99)
Potassium: 3.7 mmol/L (ref 3.5–5.1)
Sodium: 133 mmol/L — ABNORMAL LOW (ref 135–145)

## 2023-02-26 LAB — CBC
HCT: 37.8 % — ABNORMAL LOW (ref 39.0–52.0)
Hemoglobin: 11.9 g/dL — ABNORMAL LOW (ref 13.0–17.0)
MCH: 27.2 pg (ref 26.0–34.0)
MCHC: 31.5 g/dL (ref 30.0–36.0)
MCV: 86.3 fL (ref 80.0–100.0)
Platelets: 350 10*3/uL (ref 150–400)
RBC: 4.38 MIL/uL (ref 4.22–5.81)
RDW: 16.3 % — ABNORMAL HIGH (ref 11.5–15.5)
WBC: 7.6 10*3/uL (ref 4.0–10.5)
nRBC: 0 % (ref 0.0–0.2)

## 2023-02-26 MED ORDER — ORAL CARE MOUTH RINSE: 15.0000 mL | OROMUCOSAL | Status: DC | PRN

## 2023-02-26 MED ORDER — ORAL CARE MOUTH RINSE
15.0000 mL | OROMUCOSAL | Status: DC
  Administered 2023-02-26 – 2023-03-09 (×38): 15 mL via OROMUCOSAL

## 2023-02-26 NOTE — Progress Notes (Signed)
Occupational Therapy Session Note  Patient Details  Name: Jeffrey Acevedo MRN: [id number] Date of Birth: April 01, 1940  Today's Date: 02/26/2023 OT Individual Time: 1113-1203 OT Individual Time Calculation (min): 50 min    Short Term Goals: Week 1:  OT Short Term Goal 1 (Week 1): to increase safety to be able to sit on a toilet, pt will maintain balance at EOB with CGA for at least 10 minutes. OT Short Term Goal 2 (Week 1): Pt will demonstrate improved motor planning of LUE to use L hand to help don shirt with mod A. OT Short Term Goal 3 (Week 1): Pt will use R hand to wash L arm with min A. OT Short Term Goal 4 (Week 1): pt will rise to stand in stedy lift with mod A of 1 5x in a row to ensure he can safely use stedy lift for toilet transfers.  Skilled Therapeutic Interventions/Progress Updates:  Pt greeted supine in bed, pt agreeable to OT intervention.      Transfers/bed mobility: pt completed supine>sit with MODA +2, assist needed for initiation, maneuvering RLE to EOB and assist to elevate trunk into sitting. Utilized stedy for OOB transfer, pt able to stand to stedy with MODA +2, dependent transfer into w/c. Pt completed 2 squat pivot transfers from EOM<>w/c to R and L side with needing MAX A +2, MAX multimodal cues needed for sequencing, positioning, head/hips relationship and body mechanics.   Worked on sitting balance from EOM with an emphasis on upright posture as pt tends to present with anterior lean and anterior pelvic tilt. MAX multimodal cues needed to elevate trunk and sit EOM with moments of CGA but mostly MIN A needed for static sitting.    Utilized eva walker to work on sit>stands from [employer], pt completed 3 reps with pt needing MODA +2 for safety. Pt required cues to elevate trunk even with increased UB support provided from eva walker.   Pt urgently reports need to urinate therefore returned pt back to room quickly and transferred pt onto toilet with stedy for toileting,  however pt unable to void despite +time on toilet.     Ended session with pt supine in bed with all needs within reach and NT present.              Therapy Documentation Precautions:  Precautions Precautions: Fall, Other (comment) Precaution Comments: strong posterior and pusher Restrictions Weight Bearing Restrictions: No  Pain: Unrated pain reported in R shoulder, rest breaks and repositioning provided as needed.    Therapy/Group: Individual Therapy  Pollyann Glen [facility] 02/26/2023, 12:28 PM

## 2023-02-26 NOTE — Progress Notes (Signed)
Physical Therapy Session Note  Patient Details  Name: Jeffrey Acevedo MRN: [id number] Date of Birth: [birth year]  Today's Date: 02/26/2023 PT Individual Time: 1421-1515 PT Individual Time Calculation (min): 54 min   Short Term Goals: Week 1:  PT Short Term Goal 1 (Week 1): Pt will transfer sup <> sit w/ mod A consistently. PT Short Term Goal 2 (Week 1): Pt will transfer sit to stand w/ max A. PT Short Term Goal 3 (Week 1): Pt will maintain seated balance > 5' at EOB. PT Short Term Goal 4 (Week 1): Pt will transfer SPT w/ max A +1. PT Short Term Goal 5 (Week 1): PT to assess gait. Week 2:     Skilled Therapeutic Interventions/Progress Updates:      Therapy Documentation Precautions:  Precautions Precautions: Fall, Other (comment) Precaution Comments: strong posterior and pusher Restrictions Weight Bearing Restrictions: No General:   Vital Signs: Therapy Vitals Temp: 98.1 F (36.7 C) Pulse Rate: (!) 53 Resp: 18 BP: 110/65 Patient Position (if appropriate): Lying Oxygen Therapy SpO2: 90 % Pain:    Therapy/Group: Individual Therapy  Loel Dubonnet PT, DPT, CSRS 02/26/2023, 6:38 PM

## 2023-02-26 NOTE — Telephone Encounter (Signed)
Patient's spouse is calling to inform us on recent health changes. Patient's spouse stated the patient had a stroke on Nov 21st and is currently at [facility] for rehab. Patient's wife stated at the last ov, the patient had red bumps on his torsos and legs. Patient's wife stated the patient has aggressive cancer and is trying to have him see an oncologist. Please advise.

## 2023-02-26 NOTE — Telephone Encounter (Signed)
Called patient, no answer busy signal

## 2023-02-26 NOTE — Progress Notes (Signed)
Physical Therapy Session Note  Patient Details  Name: Jeffrey Acevedo MRN: [id number] Date of Birth: May 29, 1940  Today's Date: 02/26/2023 PT Individual Time: 0908-1005 PT Individual Time Calculation (min): 57 min   Short Term Goals: Week 1:  PT Short Term Goal 1 (Week 1): Pt will transfer sup <> sit w/ mod A consistently. PT Short Term Goal 2 (Week 1): Pt will transfer sit to stand w/ max A. PT Short Term Goal 3 (Week 1): Pt will maintain seated balance > 5' at EOB. PT Short Term Goal 4 (Week 1): Pt will transfer SPT w/ max A +1. PT Short Term Goal 5 (Week 1): PT to assess gait.   Skilled Therapeutic Interventions/Progress Updates:  Patient returning from toilet with NT x2 using STEDY on entrance to room. Patient alert and agreeable to PT session. Took over care from NT upon returning to sit in w/c.  Patient with no pain complaint at start of session. Heavy lean to R with push from LUE.   Therapeutic Activity: While seated in w/c, pt is alert and open to education re: decreasing push away with LUE and instead to pull and lean over LUE to prop self up. Pt is able to correct for short periods with vc/ tc and established with pillow prop under RUE for improved positioning to midline.  Transfers: Pt guided in  squat pivot transfers throughout session w/c<>mat table and then back to bed. Pt's trunk is very stiff and transfer proves to be difficult requiring Max/ TotA as pt participates in lift but demos little movement into swing to target seat. Provided vc/ tc for technique throughout. Guided in L hand placement to target seat with pt moving hand prior to attempt to transfer. Similar level of assist for all transfers.   Neuromuscular Re-ed: NMR facilitated during session with focus on sitting balance and midline orientation as well as motor control. Pt guided in seated reaching to therapist's hand positioned to pt's L side. Pt again demos very stiff neck and trunk with limited cervical and  thoracic rotation in order to turn and look to L side for target. Only reaches to within BOS and to end of arm length within visual field despite multiple vc and attempt to facilitate. Attempt to guide  pt into lateral lean to L in order bring elbow to mat table. Positions arm but then unable to perform lateral trunk bend to bring elbow all the way to table. Pt demos decreased ability to follow instructions throughout session.   Pt positioned with small t-ball under LUE and guided in rolling ball to L to increase lean onto L side. Requires facilitation into roll of ball laterally d/t inability to follow instructions and need for support to prevent forward LOB with overall ModA. Improved ability to move spine and roll into lateral roll of ball to L while UE supported on ball.  NMR performed for improvements in motor control and coordination, balance, sequencing, judgement, and self confidence/ efficacy in performing all aspects of mobility at highest level of independence.   Patient supine in bed at end of session with brakes locked, bed alarm set, and all needs within reach.   Therapy Documentation Precautions:  Precautions Precautions: Fall, Other (comment) Precaution Comments: strong posterior and pusher Restrictions Weight Bearing Restrictions: No  Pain:  No pain complaint this session.    Therapy/Group: Individual Therapy  Loel Dubonnet PT, DPT, CSRS 02/26/2023, 6:16 PM

## 2023-02-26 NOTE — Progress Notes (Signed)
PROGRESS NOTE   Subjective/Complaints: No issues overnite , pt without c/o, HOH Eating breakfast using left hand full supervision   ROS- limited by mental status, denies pain  Objective:   No results found. Recent Labs    02/26/23 0532  WBC 7.6  HGB 11.9*  HCT 37.8*  PLT 350   Recent Labs    02/26/23 0532  NA 133*  K 3.7  CL 101  CO2 24  GLUCOSE 92  BUN 22  CREATININE 1.21  CALCIUM 9.1    Intake/Output Summary (Last 24 hours) at 02/26/2023 0755 Last data filed at 02/25/2023 1300 Gross per 24 hour  Intake 238 ml  Output 250 ml  Net -12 ml     Pressure Injury 09/19/21 Coccyx Unstageable - Full thickness tissue loss in which the base of the injury is covered by slough (yellow, tan, gray, green or Mccole) and/or eschar (tan, [place] or black) in the wound bed. oval shaped wound on coccyx with yello (Active)  09/19/21 1450  Location: Coccyx  Location Orientation:   Staging: Unstageable - Full thickness tissue loss in which the base of the injury is covered by slough (yellow, tan, gray, green or E[place]le) and/or eschar (tan, Harron or black) in the wound bed.  Wound Description (Comments): oval shaped wound on coccyx with yellow slough  Present on Admission: Yes (Originally listed as stage 1 09/19/21; labeled as unstageable 02/22/23 on admit to CIR)    Physical Exam: Vital Signs Blood pressure 129/70, pulse (!) 54, temperature 97.9 F (36.6 C), temperature source Axillary, resp. rate 18, height 6' (1.829 m), weight 78.2 kg, SpO2 94%.  General: No acute distress Mood and affect are appropriate Heart: Regular rate and rhythm no rubs murmurs or extra sounds Lungs: Clear to auscultation, breathing unlabored, no rales or wheezes Abdomen: Positive bowel sounds, soft nontender to palpation, nondistended Extremities: No clubbing, cyanosis, or edema Skin: No evidence of breakdown, no evidence of rash   Neurologic: Cranial  nerves II through XII intact, motor strength is 5/5 in Left and 4/5 RIght deltoid, bicep, tricep, grip, 5/5 bilateral hip flexor, knee extensors, ankle dorsiflexor and plantar flexor Sensory exam limited by attention, stable 12/7  Musculoskeletal: no pain with AROM in all 4 ext  No joint swelling    Assessment/Plan: 1. Functional deficits which require 3+ hours per day of interdisciplinary therapy in a comprehensive inpatient rehab setting. Physiatrist is providing close team supervision and 24 hour management of active medical problems listed below. Physiatrist and rehab team continue to assess barriers to discharge/monitor patient progress toward functional and medical goals  Care Tool:  Bathing    Body parts bathed by patient: Chest, Abdomen, Face, Right arm   Body parts bathed by helper: Left arm, Front perineal area, Buttocks, Right upper leg, Left lower leg, Right lower leg, Left upper leg     Bathing assist Assist Level: Maximal Assistance - Patient 24 - 49%     Upper Body Dressing/Undressing Upper body dressing   What is the patient wearing?: Pull over shirt    Upper body assist Assist Level: Moderate Assistance - Patient 50 - 74%    Lower Body Dressing/Undressing Lower body  dressing      What is the patient wearing?: Pants     Lower body assist Assist for lower body dressing: Maximal Assistance - Patient 25 - 49%     Toileting Toileting    Toileting assist Assist for toileting: 2 Helpers     Transfers Chair/bed transfer  Transfers assist     Chair/bed transfer assist level: Total Assistance - Patient < 25%     Locomotion Ambulation   Ambulation assist   Ambulation activity did not occur: Safety/medical concerns          Walk 10 feet activity   Assist  Walk 10 feet activity did not occur: Safety/medical concerns        Walk 50 feet activity   Assist Walk 50 feet with 2 turns activity did not occur: Safety/medical concerns          Walk 150 feet activity   Assist Walk 150 feet activity did not occur: Safety/medical concerns         Walk 10 feet on uneven surface  activity   Assist Walk 10 feet on uneven surfaces activity did not occur: Safety/medical concerns         Wheelchair     Assist Is the patient using a wheelchair?: Yes Type of Wheelchair: Manual    Wheelchair assist level: Dependent - Patient 0%      Wheelchair 50 feet with 2 turns activity    Assist        Assist Level: Dependent - Patient 0%   Wheelchair 150 feet activity     Assist      Assist Level: Dependent - Patient 0%   Blood pressure 129/70, pulse (!) 54, temperature 97.9 F (36.6 C), temperature source Axillary, resp. rate 18, height 6' (1.829 m), weight 78.2 kg, SpO2 94%.  Medical Problem List and Plan: 1. 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  Antithrombotics: -DVT/anticoagulation:  Pharmaceutical: Lovenox             -antiplatelet therapy: DAPT 3. Pain Management: Tylenol prn. Gabapentin 100 mg TID was added on 11/28 4. Mood/Behavior/Sleep:  LCSW to follow for evaluation and support.              -antipsychotic agents: N/A--monitor for delirium/MS changes with recent stroke/change in environment insetting of baseline dementia. 5. Neuropsych/cognition: This patient may be intermittently capable of making decisions on his own behalf.             --family has noticed some disorientation and have been present 24/7 at [facility]. Unable to do that here             --also [facility] and wears bilateral hearing aids. 6. Skin/Wound Care:  Routine pressure relief pressure.   7. Fluids/Electrolytes/Nutrition: Monitor I/O. Check CMET in am             --Monitor I/O. Lasix was d/c yesterday for ???  8.  CAD/Chronic diastolic CHF: Followed by Dr. Allyson Sabal. HH diet with strict I/O. Daily weights             --Most recent PCI 12/29/2022 followed by admissions  for fluid overload Continue Jardiance , Lopressor , aldactone              --was on Lasix 40 mg BID PTA-->d/c on 12/04 for ??. Monitor for signs of overload  9. Chronic respiratory failure w/nocturnal hypoxia: Since Sept--sleep study negative for OSA             --currently oxygen dependent with activity and intermittently during hospitalization --Encourage pulmonary hygiene. Will check CXR to rule out overload.              --Oxygen prn during the day and continue at nights.    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 Will check dopplers             Family asking pt not be told of dx just found out 02/21/23.   11. CKD 3a: Baseline SCr 1.4-1.5 per records             --monitor weekly  12. Hx of anemia of chronic disease:Hgb stable as well as B12            --continue iron supplement    Latest Ref Rng & Units 02/26/2023    5:32 AM 02/23/2023    6:31 AM 01/13/2023    2:33 AM  CBC  WBC 4.0 - 10.5 K/uL 7.6  8.4  6.4   Hemoglobin 13.0 - 17.0 g/dL 96.2  95.2  9.4   Hematocrit 39.0 - 52.0 % 37.8  37.5  29.4   Platelets 150 - 400 K/uL 350  337  198     13. Hx of vitamin B 12 deficiency: Continue high doses of B12   14. Chronic left knee pain: Monitor for symptoms with increase in activity/Right hemipareis  15. Hx major depressive d/o: continue zoloft.   16. Hx prostate cancer- sounds like could be indolent:Has urgency/nocturia. Toilet every 4 hours. Continue to check PVRs  17. Hx prediabetes: Hgb A 1c- 5.9 few months ago. Monitor fasting BS with serial labs.   18. R shoulder pain- no pain c/o or pain with R shoulder ROM this am , Xray with mild OA         LOS: 4 days A FACE TO FACE EVALUATION WAS PERFORMED  Erick Colace 02/26/2023, 7:55 AM

## 2023-02-26 NOTE — Plan of Care (Signed)
  Problem: Consults Goal: RH STROKE PATIENT EDUCATION Description: See Patient Education module for education specifics  Outcome: Not Progressing   Problem: RH BOWEL ELIMINATION Goal: RH STG MANAGE BOWEL WITH ASSISTANCE Description: STG Manage Bowel with toileting Assistance. Outcome: Not Progressing Goal: RH STG MANAGE BOWEL W/MEDICATION W/ASSISTANCE Description: STG Manage Bowel with Medication with mod I Assistance. Outcome: Not Progressing   Problem: RH BLADDER ELIMINATION Goal: RH STG MANAGE BLADDER WITH ASSISTANCE Description: STG Manage Bladder With toileting Assistance Outcome: Not Progressing   Problem: RH SKIN INTEGRITY Goal: RH STG SKIN FREE OF INFECTION/BREAKDOWN Description: Manage w min assist Outcome: Not Progressing Goal: RH STG MAINTAIN SKIN INTEGRITY WITH ASSISTANCE Description: STG Maintain Skin Integrity With min Assistance. Outcome: Not Progressing Goal: RH STG ABLE TO PERFORM INCISION/WOUND CARE W/ASSISTANCE Description: STG Able To Perform Incision/Wound Care With min Assistance. Outcome: Not Progressing   Problem: RH SAFETY Goal: RH STG ADHERE TO SAFETY PRECAUTIONS W/ASSISTANCE/DEVICE Description: STG Adhere to Safety Precautions With cues Assistance/Device. Outcome: Not Progressing   Problem: RH COGNITION-NURSING Goal: RH STG USES MEMORY AIDS/STRATEGIES W/ASSIST TO PROBLEM SOLVE Description: STG Uses Memory Aids/Strategies With cues Assistance to Problem Solve. Outcome: Not Progressing   Problem: RH PAIN MANAGEMENT Goal: RH STG PAIN MANAGED AT OR BELOW PT'S PAIN GOAL Description: <4 with prns Outcome: Not Progressing   Problem: RH KNOWLEDGE DEFICIT Goal: RH STG INCREASE KNOWLEDGE OF HYPERTENSION Description: Patient and family will be able to manage HTN with medications and dietary modifications using educational resources independently Outcome: Not Progressing Goal: RH STG INCREASE KNOWLEGDE OF HYPERLIPIDEMIA Description: Patient and family  will be able to manage HLD with medications and dietary modifications using educational resources independently Outcome: Not Progressing Goal: RH STG INCREASE KNOWLEDGE OF STROKE PROPHYLAXIS Description: Patient and family will be able to manage secondary risks with medications and dietary modifications using educational resources independently Outcome: Not Progressing

## 2023-02-26 NOTE — Progress Notes (Addendum)
Speech Language Pathology Daily Session Note  Patient Details  Name: Jeffrey Acevedo MRN: [id number] Date of Birth: 06-19-1940  Today's Date: 02/26/2023 SLP Individual Time: 0802-0900 SLP Individual Time Calculation (min): 58 min  Short Term Goals: Week 1: SLP Short Term Goal 1 (Week 1): Patient will utilize swallowing compensatory strategies during consumption of D3/thin liquid diet given min multimodal A SLP Short Term Goal 2 (Week 1): Patient will demonstrate orientation to self, place, and situation given max multimodal A SLP Short Term Goal 3 (Week 1): Patient will increase speech intelligibility to 80% at the phrase level given mod multimodal A SLP Short Term Goal 4 (Week 1): Patient will recall biographical information given max multimodal A SLP Short Term Goal 5 (Week 1): Patient will demonstrate focused attention during functional therapeutic tasks given max multimodal A SLP Short Term Goal 6 (Week 1): Patient will demonstrate awareness of current medical situation given max multimodal A  Skilled Therapeutic Interventions:  Patient was seen in am to address cognitive re- training and dysphagia management. Pt was alert and seated upright in bed upon SLP arrival. Assigned NT present and providing supervision for breakfast meal. SLP took over supervision with pt requiring min to mod cues for awareness of L visual field throughout meal. He consumed solids with suspected prolonged mastication but adequate bolus formation, transit and deglutition with minimal to no lingual residue post swallow which cleared indep. Pt also with absence of buccal pocketing. He consumed thin liquids at conclusion of meal via straw sips. Pt presented with wet vocal quality and/or cough after swallow of thin liquids via straw in 3/5 opportunities. Later in session, SLP presented pt with thin liquids via cup edge with cough after swallow in 1/2 opportunities. Suspect positioning possibly related however, SLP plan to  f/u in upcoming session. Pt indep oriented to hospital and situation. He demo awareness of deficits post CVA, reporting his voice does not sound the same. When asked, he verbalized his age, DOB, and family members with sup A. SLP provied training in use of call button. He required mod to max A throughout session to identify call button and to subsequently verbalize examples of utilization. Following directions was addressed intermittently throughout session in functional task. Pt warranted mod to max A for following 1 step directions with 40% acc. At conclusion of session, pt was left seated upright in bed, with call button within reach, and telesitter, present. SLP to continue POC.   Pain Pain Assessment Pain Scale: 0-10 Pain Score: 0-No pain  Therapy/Group: Individual Therapy  Renaee Munda 02/26/2023, 8:51 AM

## 2023-02-27 ENCOUNTER — Inpatient Hospital Stay (HOSPITAL_COMMUNITY): Payer: Medicare PPO

## 2023-02-27 DIAGNOSIS — C8338 Diffuse large B-cell lymphoma, lymph nodes of multiple sites: Secondary | ICD-10-CM | POA: Diagnosis not present

## 2023-02-27 DIAGNOSIS — F01B4 Vascular dementia, moderate, with anxiety: Secondary | ICD-10-CM | POA: Diagnosis not present

## 2023-02-27 DIAGNOSIS — I509 Heart failure, unspecified: Secondary | ICD-10-CM | POA: Diagnosis not present

## 2023-02-27 DIAGNOSIS — I639 Cerebral infarction, unspecified: Secondary | ICD-10-CM | POA: Diagnosis not present

## 2023-02-27 LAB — URINE CULTURE: Culture: 30000 — AB

## 2023-02-27 NOTE — Plan of Care (Signed)
  Problem: Consults Goal: RH STROKE PATIENT EDUCATION Description: See Patient Education module for education specifics  Outcome: Progressing   Problem: RH BOWEL ELIMINATION Goal: RH STG MANAGE BOWEL WITH ASSISTANCE Description: STG Manage Bowel with toileting Assistance. Outcome: Progressing Goal: RH STG MANAGE BOWEL W/MEDICATION W/ASSISTANCE Description: STG Manage Bowel with Medication with mod I Assistance. Outcome: Progressing   Problem: RH BLADDER ELIMINATION Goal: RH STG MANAGE BLADDER WITH ASSISTANCE Description: STG Manage Bladder With toileting Assistance Outcome: Progressing   Problem: RH SKIN INTEGRITY Goal: RH STG SKIN FREE OF INFECTION/BREAKDOWN Description: Manage w min assist Outcome: Progressing Goal: RH STG MAINTAIN SKIN INTEGRITY WITH ASSISTANCE Description: STG Maintain Skin Integrity With min Assistance. Outcome: Progressing Goal: RH STG ABLE TO PERFORM INCISION/WOUND CARE W/ASSISTANCE Description: STG Able To Perform Incision/Wound Care With min Assistance. Outcome: Progressing   Problem: RH SAFETY Goal: RH STG ADHERE TO SAFETY PRECAUTIONS W/ASSISTANCE/DEVICE Description: STG Adhere to Safety Precautions With cues Assistance/Device. Outcome: Progressing   Problem: RH COGNITION-NURSING Goal: RH STG USES MEMORY AIDS/STRATEGIES W/ASSIST TO PROBLEM SOLVE Description: STG Uses Memory Aids/Strategies With cues Assistance to Problem Solve. Outcome: Progressing   Problem: RH PAIN MANAGEMENT Goal: RH STG PAIN MANAGED AT OR BELOW PT'S PAIN GOAL Description: <4 with prns Outcome: Progressing   Problem: RH KNOWLEDGE DEFICIT Goal: RH STG INCREASE KNOWLEDGE OF HYPERTENSION Description: Patient and family will be able to manage HTN with medications and dietary modifications using educational resources independently Outcome: Progressing Goal: RH STG INCREASE KNOWLEGDE OF HYPERLIPIDEMIA Description: Patient and family will be able to manage HLD with medications  and dietary modifications using educational resources independently Outcome: Progressing Goal: RH STG INCREASE KNOWLEDGE OF STROKE PROPHYLAXIS Description: Patient and family will be able to manage secondary risks with medications and dietary modifications using educational resources independently Outcome: Progressing

## 2023-02-27 NOTE — Telephone Encounter (Signed)
Patient identification verified by 2 forms. Jeffrey Rail, RN    Called and spoke to patients wife [name] states:   -Patient has appointment 12/17  -patient had a stroke on 11/121  -patient is currently in rehab, unsure of when he will be discharged   -patient likely will not be able to present to 12/17 appointment   -dermatologist reviewed red bumps that were on chest, they are B-cell lymphoma   -currently looking for oncologist for patient  Informed [name] 12/17 appointment canceled, will keep 1/14 appointment with Dr. Areta Haber agrees with plan, no questions at this time

## 2023-02-27 NOTE — Progress Notes (Signed)
PROGRESS NOTE   Subjective/Complaints: Wife stayed overnite, , pt slept poorly , wife shared radiology reports from [place], asking about Onc eval   ROS- limited by mental status, denies pain  Objective:   No results found. Recent Labs    02/26/23 0532  WBC 7.6  HGB 11.9*  HCT 37.8*  PLT 350   Recent Labs    02/26/23 0532  NA 133*  K 3.7  CL 101  CO2 24  GLUCOSE 92  BUN 22  CREATININE 1.21  CALCIUM 9.1    Intake/Output Summary (Last 24 hours) at 02/27/2023 0732 Last data filed at 02/26/2023 1800 Gross per 24 hour  Intake 358 ml  Output 257 ml  Net 101 ml     Pressure Injury 09/19/21 Coccyx Unstageable - Full thickness tissue loss in which the base of the injury is covered by slough (yellow, tan, gray, green or [place]) and/or eschar (tan, [place] or black) in the wound bed. oval shaped wound on coccyx with yello (Active)  09/19/21 1450  Location: Coccyx  Location Orientation:   Staging: Unstageable - Full thickness tissue loss in which the base of the injury is covered by slough (yellow, tan, gray, green or [place]) and/or eschar (tan, [place]e or black) in the wound bed.  Wound Description (Comments): oval shaped wound on coccyx with yellow slough  Present on Admission: Yes (Originally listed as stage 1 09/19/21; labeled as unstageable 02/22/23 on admit to CIR)    Physical Exam: Vital Signs Blood pressure (!) 144/66, pulse (!) 54, temperature 97.6 F (36.4 C), resp. rate 17, height 6' (1.829 m), weight 78.2 kg, SpO2 95%.  General: No acute distress Mood and affect are appropriate Heart: Regular rate and rhythm no rubs murmurs or extra sounds Lungs: Clear to auscultation, breathing unlabored, no rales or wheezes Abdomen: Positive bowel sounds, soft nontender to palpation, nondistended Extremities: No clubbing, cyanosis, or edema Skin: No evidence of breakdown, no evidence of rash   Neurologic: Cranial nerves  II through XII intact, motor strength is 5/5 in Left and 4/5 RIght deltoid, bicep, tricep, grip, 5/5 bilateral hip flexor, knee extensors, ankle dorsiflexor and plantar flexor Oriented to person but not time   Musculoskeletal: no pain with AROM in all 4 ext  No joint swelling    Assessment/Plan: 1. Functional deficits which require 3+ hours per day of interdisciplinary therapy in a comprehensive inpatient rehab setting. Physiatrist is providing close team supervision and 24 hour management of active medical problems listed below. Physiatrist and rehab team continue to assess barriers to discharge/monitor patient progress toward functional and medical goals  Care Tool:  Bathing    Body parts bathed by patient: Chest, Abdomen, Face, Right arm   Body parts bathed by helper: Left arm, Front perineal area, Buttocks, Right upper leg, Left lower leg, Right lower leg, Left upper leg     Bathing assist Assist Level: Maximal Assistance - Patient 24 - 49%     Upper Body Dressing/Undressing Upper body dressing   What is the patient wearing?: Pull over shirt    Upper body assist Assist Level: Moderate Assistance - Patient 50 - 74%    Lower Body Dressing/Undressing Lower  body dressing      What is the patient wearing?: Pants     Lower body assist Assist for lower body dressing: Maximal Assistance - Patient 25 - 49%     Toileting Toileting    Toileting assist Assist for toileting: 2 Helpers     Transfers Chair/bed transfer  Transfers assist  Chair/bed transfer activity did not occur: Safety/medical concerns (unsafe to get up)  Chair/bed transfer assist level: 2 Helpers (MAX A +2 squat pivot)     Locomotion Ambulation   Ambulation assist   Ambulation activity did not occur: Safety/medical concerns          Walk 10 feet activity   Assist  Walk 10 feet activity did not occur: Safety/medical concerns        Walk 50 feet activity   Assist Walk 50 feet with 2  turns activity did not occur: Safety/medical concerns         Walk 150 feet activity   Assist Walk 150 feet activity did not occur: Safety/medical concerns         Walk 10 feet on uneven surface  activity   Assist Walk 10 feet on uneven surfaces activity did not occur: Safety/medical concerns         Wheelchair     Assist Is the patient using a wheelchair?: Yes Type of Wheelchair: Manual    Wheelchair assist level: Dependent - Patient 0%      Wheelchair 50 feet with 2 turns activity    Assist        Assist Level: Dependent - Patient 0%   Wheelchair 150 feet activity     Assist      Assist Level: Dependent - Patient 0%   Blood pressure (!) 144/66, pulse (!) 54, temperature 97.6 F (36.4 C), resp. rate 17, height 6' (1.829 m), weight 78.2 kg, SpO2 95%.  Medical Problem List and Plan: 1. 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  Team conf in am              Admit to CIR 2.  Antithrombotics: -DVT/anticoagulation:  Pharmaceutical: Lovenox             -antiplatelet therapy: DAPT 3. Pain Management: Tylenol prn. Gabapentin 100 mg TID was added on 11/28 4. Mood/Behavior/Sleep:  LCSW to follow for evaluation and support.              -antipsychotic agents: N/A--monitor for delirium/MS changes with recent stroke/change in environment insetting of baseline dementia. 5. Neuropsych/cognition: This patient may be intermittently capable of making decisions on his own behalf.             --family has noticed some disorientation and have been present 24/7 at [facility]. Unable to do that here             --also [facility] and wears bilateral hearing aids. 6. Skin/Wound Care:  Routine pressure relief pressure.   7.  Fluids/Electrolytes/Nutrition: Monitor I/O. Check CMET in am             --Monitor I/O. Lasix was d/c yesterday for ???  8. CAD/Chronic diastolic CHF: Followed by Dr. Allyson Sabal. HH diet  with strict I/O. Daily weights             --Most recent PCI 12/29/2022 followed by admissions for fluid overload Continue Jardiance , Lopressor , aldactone              --was on Lasix 40 mg BID PTA-->d/c on 12/04 for ??. Monitor for signs of overload  9. Chronic respiratory failure w/nocturnal hypoxia: Since Sept--sleep study negative for OSA             --currently oxygen dependent with activity and intermittently during hospitalization --Encourage pulmonary hygiene. Will check CXR to rule out overload.              --Oxygen prn during the day and continue at nights.    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 Will check dopplers            11. CKD 3a: Baseline SCr 1.4-1.5 per records             --monitor weekly  12. Hx of anemia of chronic disease:Hgb stable as well as B12            --continue iron supplement    Latest Ref Rng & Units 02/26/2023    5:32 AM 02/23/2023    6:31 AM 01/13/2023    2:33 AM  CBC  WBC 4.0 - 10.5 K/uL 7.6  8.4  6.4   Hemoglobin 13.0 - 17.0 g/dL 65.7  84.6  9.4   Hematocrit 39.0 - 52.0 % 37.8  37.5  29.4   Platelets 150 - 400 K/uL 350  337  198     13. Hx of vitamin B 12 deficiency: Continue high doses of B12   14. Chronic left knee pain: Monitor for symptoms with increase in activity/Right hemipareis  15. Hx major depressive d/o: continue zoloft.   16. Hx prostate cancer- sounds like could be indolent:Has urgency/nocturia. Toilet every 4 hours. Continue to check PVRs  17. Hx prediabetes: Hgb A 1c- 5.9 few  months ago. Monitor fasting BS with serial labs.   18. R shoulder pain- no pain c/o or pain with R shoulder ROM this am , Xray with mild OA        LOS: 5 days A FACE TO FACE EVALUATION WAS PERFORMED  Erick Colace 02/27/2023, 7:32 AM

## 2023-02-27 NOTE — Progress Notes (Signed)
Speech Language Pathology Daily Session Note  Patient Details  Name: Jeffrey Acevedo MRN: [id number] Date of Birth: Aug 17, 1940  Today's Date: 02/27/2023 SLP Individual Time: 1300-1400 SLP Individual Time Calculation (min): 60 min  Short Term Goals: Week 1: SLP Short Term Goal 1 (Week 1): Patient will utilize swallowing compensatory strategies during consumption of D3/thin liquid diet given min multimodal A SLP Short Term Goal 2 (Week 1): Patient will demonstrate orientation to self, place, and situation given max multimodal A SLP Short Term Goal 3 (Week 1): Patient will increase speech intelligibility to 80% at the phrase level given mod multimodal A SLP Short Term Goal 4 (Week 1): Patient will recall biographical information given max multimodal A SLP Short Term Goal 5 (Week 1): Patient will demonstrate focused attention during functional therapeutic tasks given max multimodal A SLP Short Term Goal 6 (Week 1): Patient will demonstrate awareness of current medical situation given max multimodal A  Skilled Therapeutic Interventions: Skilled therapy session focused on dysphagia and cognitive goals. SLP faciliated session by observing patient with consumption of D2/thin liquid textures. Patient with occasional immediate and delayed throat clear which may be indicative of bolus misdirection. No buccal pocketing noted. Recommend continuation of current diet with full supervision and plan for MBS tomorrow to assess oropharyngeal swallow function. SLP addressed cognition through completion of orientation task. Patient independently recalled self, place, and situation, though required min-modA for utilization of calendar. SLP continued to target cognition through intellectual awareness task. Patient independently recalled some increased confusion this date, though required mod-maxA to identify specific changes due to stroke. SLP attempted to target memory, though despite total A, patient unable to recall  activities completed this date.  Cognition was further challenged through providing maxA during functional problem solving tasks including use of call bell. SLP provided poster stating "press red button for help/nurse" to aid in utilization. Patient left in bed with alarm set and call bell in reach. Continue POC.   Pain None stated   Therapy/Group: Individual Therapy  Emilee Market M.A., CF-SLP 02/27/2023, 12:44 PM

## 2023-02-27 NOTE — Progress Notes (Addendum)
Occupational Therapy Session Note  Patient Details  Name: Jeffrey Acevedo MRN: [id number] Date of Birth: [birth year]  Today's Date: 02/27/2023 OT Individual Time: 0945-1100 OT Individual Time Calculation (min): 75 min    Short Term Goals: Week 1:  OT Short Term Goal 1 (Week 1): to increase safety to be able to sit on a toilet, pt will maintain balance at EOB with CGA for at least 10 minutes. OT Short Term Goal 2 (Week 1): Pt will demonstrate improved motor planning of LUE to use L hand to help don shirt with mod A. OT Short Term Goal 3 (Week 1): Pt will use R hand to wash L arm with min A. OT Short Term Goal 4 (Week 1): pt will rise to stand in stedy lift with mod A of 1 5x in a row to ensure he can safely use stedy lift for toilet transfers.  Skilled Therapeutic Interventions/Progress Updates:    Pt received in bed sound asleep.  Pt's wife in room and reported that pt was up all night long and had not slept.  She was able to clarify his PLOF.  He did have some memory deficits but not severe signs of dementia. He was able to walk with a RW with supervision but had help with bathing and toileting.  He could sometimes get dressed by himself.  Awoke pt and had him sit at EOB with mod A of 2.  Once sitting he was able to hold his midline posture with only min guarding.   Pt had wrist splint on but guarding his hand stating he was having pain in wrist when he tries to grasp or rotate his arm.  Pt able to lean forward with more control and hold forward lean with mod A to enable OT and rehab tech to complete squat pivot transfer to his R with mod A of 2.     Once in w/c pt positioned at sink to engage in doffing clothing, bathing and donning clothing. Pt needed significant A with self care due to limited attention to task (he was extremely tired),  limited visual focus on tasks (ie looking at himself in mirror when brushing hair).  Pt needed max A each time to lean forward in w/c when OT assisted pt  with washing his back, putting on a shirt.   For LB clothing change, had pt sit to stand with only mod A of 1 but once upright needs total A to hold balance (rehab tech supported pt) as OT pulled up clothing. Pt with strong R lean. Tried to use the mirror as a cue but pt not able to focus on himself.    Removed splint for UB bathing. Pt expressed intense pain when trying to hold a wash cloth or even when L hand washing R forearm.    Rehab tech had to leave so nurse tech assisted with squat pivot back to bed with max A of 2 and then total A to move to supine.  Applied ice pack at end of session to R wrist elevated on pillows. Returned 30 min later to  remove and redonn splint.    Pt in bed with alarm on and telesitter on. Call light in reach.   Therapy Documentation Precautions:  Precautions Precautions: Fall, Other (comment) Precaution Comments: strong posterior and pusher Restrictions Weight Bearing Restrictions: No    Vital Signs: Therapy Vitals Temp: 97.6 F (36.4 C) Pulse Rate: (!) 54 Resp: 17 BP: (!) 144/66 Patient Position (if appropriate):  Lying Oxygen Therapy SpO2: 95 % O2 Device: Room Air Pain: No pain at rest,  pt did have pain in R hand, wrist, shoulder today with very limited AROM compared to yesterday. Notified his PA and MD  ADL: ADL Eating: Supervision/safety Grooming: Minimal assistance Upper Body Bathing: Maximal assistance Where Assessed-Upper Body Bathing: Sitting at sink Lower Body Bathing: Dependent Where Assessed-Lower Body Bathing: Sitting at sink Upper Body Dressing: Maximal assistance Where Assessed-Upper Body Dressing: Sitting at sink Lower Body Dressing: Dependent Where Assessed-Lower Body Dressing: Other (Comment) (with use of a stedy lift and 2 persons) Toileting: Dependent Where Assessed-Toileting: Bed level Toilet Transfer: Unable to assess (attempted transfer but not safe due to leaning) Film/video editor Method: Unable to  assess  Therapy/Group: Individual Therapy  Maryjo Ragon 02/27/2023, 8:29 AM

## 2023-02-27 NOTE — Progress Notes (Deleted)
Cardiology Clinic Note   Patient Name: Jeffrey Acevedo Date of Encounter: 02/27/2023  Primary Care Provider:  [name], MD Primary Cardiologist:  [name], MD  Patient Profile    Jeffrey Acevedo 51 male presents to the clinic today for follow-up evaluation of his coronary artery disease and essential hypertension.  Past Medical History    Past Medical History:  Diagnosis Date   Acute on chronic diastolic CHF (congestive heart failure) (HCC) 01/03/2023   Cancer (HCC)    prostate cancer surgery 2011   Chronic diastolic heart failure (HCC)    Coronary artery disease    sees Dr. Erlene Quan 3 mths ago.  Stress test 03/31/2015 in epic   Degenerative joint disease    Right hip   Depression    Hearing loss of both ears    only at present wears the right ear hearing aid   Hyperlipidemia    Hypertension    Prostate cancer [facility])    Past Surgical History:  Procedure Laterality Date   CARDIAC CATHETERIZATION  06/17/2009   Proximal LAD 90% stenosis just beyond previously stented segment with a Multi-Link Vision bare-metal stent   CORONARY ANGIOPLASTY     stents placed in 1998 and 2011    CORONARY PRESSURE/FFR STUDY N/A 05/02/2021   Procedure: INTRAVASCULAR PRESSURE WIRE/FFR STUDY;  Surgeon: Runell Gess, MD;  Location: [facility];  Service: Cardiovascular;  Laterality: N/A;   CORONARY PRESSURE/FFR STUDY N/A 09/19/2021   Procedure: INTRAVASCULAR PRESSURE WIRE/FFR STUDY;  Surgeon: Runell Gess, MD;  Location: [facility];  Service: Cardiovascular;  Laterality: N/A;  RAMUS   CORONARY STENT INTERVENTION N/A 05/02/2021   Procedure: CORONARY STENT INTERVENTION;  Surgeon: Runell Gess, MD;  Location: [facility];  Service: Cardiovascular;  Laterality: N/A;   CORONARY STENT INTERVENTION N/A 09/19/2021   Procedure: CORONARY STENT INTERVENTION;  Surgeon: Runell Gess, MD;  Location: [facility];  Service: Cardiovascular;  Laterality: N/A;    CORONARY STENT INTERVENTION N/A 12/29/2022   Procedure: CORONARY STENT INTERVENTION;  Surgeon: Kathleene Hazel, MD;  Location: [facility];  Service: Cardiovascular;  Laterality: N/A;   LEFT HEART CATH AND CORONARY ANGIOGRAPHY N/A 05/02/2021   Procedure: LEFT HEART CATH AND CORONARY ANGIOGRAPHY;  Surgeon: Runell Gess, MD;  Location: [facility];  Service: Cardiovascular;  Laterality: N/A;   LEFT HEART CATH AND CORONARY ANGIOGRAPHY N/A 09/19/2021   Procedure: LEFT HEART CATH AND CORONARY ANGIOGRAPHY;  Surgeon: Runell Gess, MD;  Location: [facility];  Service: Cardiovascular;  Laterality: N/A;   NASAL SEPTUM SURGERY     Dr. Richardson Landry @ [facility]  2014   [facility]  2011   RIGHT/LEFT HEART CATH AND CORONARY ANGIOGRAPHY N/A 12/29/2022   Procedure: RIGHT/LEFT HEART CATH AND CORONARY ANGIOGRAPHY;  Surgeon: Kathleene Hazel, MD;  Location: [facility];  Service: Cardiovascular;  Laterality: N/A;   ROBOT ASSISTED LAPAROSCOPIC RADICAL PROSTATECTOMY     TOTAL HIP ARTHROPLASTY Right 04/19/2015   Procedure: RIGHT TOTAL HIP ARTHROPLASTY ANTERIOR APPROACH;  Surgeon: Jodi Geralds, MD;  Location: [facility];  Service: Orthopedics;  Laterality: Right;    Allergies  Allergies  Allergen Reactions   Adhesive [Tape] Other (See Comments)    Mainly just redness.   Has been ruled out for latex allegery   Hydrochlorothiazide Other (See Comments)    H/o Pancreatitis, takes in Avalide at home  Latex Other (See Comments)    unknown   Vantin [Cefpodoxime] Rash    History of Present Illness    Jeffrey Acevedo has a PMH of HFpEF, coronary artery disease status post remote LAD stenting in 1998, CKD stage III, HLD, and HTN.  He was admitted 10/25 for lower extremity edema and dyspnea.  He was diagnosed with acute CHF.  This was in the setting of dietary indiscretion related to sodium.  His echocardiogram showed LV function of 65-70% with moderate  asymmetric LVH of the septal segment, G1 DD, normal RV function, chordal systolic anterior motion which seem to be intracavity obstruction.  No posteriorly directed mitral valve regurgitation was noted to suspect mitral valve systolic anterior motion.  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  Due to outflow tract obstruction Imdur was discontinued.  He was started on metoprolol.  He received IV diuresis and was transition back to p.o. Lasix and spironolactone.  His Jardiance was continued.  He was seen in follow-up by Boyce Medici PA-C on 01/24/2023.  He presented with his wife and daughter.  He was in wheelchair.  He reported that his breathing had improved.  He was not very active at baseline.  He denied dyspnea.  He was able to do basic ADLs and ambulate without dyspnea.  He had been monitoring his weight daily.  It had continued to trend down since discharge.  He had made significant changes in his diet.  He was eating less and low-sodium.  His blood pressure was noted to be 90/50.  This was consistent with his baseline blood pressure.  He had no orthostatic symptoms.  He was wearing lower extremity support stockings.  He denied chest pain.  He contacted the nurse triage line 01/31/2023 and reported dizziness.  He denied chest pain.  He had a 10 pound weight loss within 1 week.  He saw his PCPs PA who was not concerned about dizziness.  He denied syncope.  He presents to the clinic today for follow-up evaluation and states he has been having a bloating type sensation.  He notices his symptoms are worse with getting up and moving from side-to-side.  We reviewed his recent hospitalization and follow-up visits.  He expressed understanding.  He presents today with his 2 daughters.  His weight today is 181 pounds.  It has continued to decrease over the last several days.  His blood pressure today is 94/56.  His pulse is 56.  Daughters  report they have been following a very strict low-sodium diet.  He reports that he has only been drinking about 16 ounces daily.  It appears that he has over diuresed.  I will hold his furosemide x 2 days, order a BMP, have him increase his p.o. hydration to 36-48 ounces daily, and increase the sodium in his diet slightly.  I will plan follow-up in around 1 month.  Today he denies chest pain, shortness of breath, lower extremity edema, fatigue, palpitations, melena, hematuria, hemoptysis, diaphoresis, orthopnea, and PND.    Home Medications    Prior to Admission medications   Medication Sig Start Date End Date Taking? Authorizing Provider  aspirin EC 81 MG EC tablet Take 1 tablet (81 mg total) by mouth daily. Swallow whole. 05/04/21   Arty Baumgartner, NP  atorvastatin (LIPITOR) 40 MG tablet Take 1 tablet (40 mg total) by mouth daily. 01/19/23 02/18/23  Arrien, York Ram, MD  Choline Fenofibrate (FENOFIBRIC ACID) 45 MG  CPDR Take 1 capsule by mouth once daily 11/02/22   Runell Gess, MD  clopidogrel (PLAVIX) 75 MG tablet Take 1 tablet (75 mg total) by mouth daily. 06/13/22     Cyanocobalamin (VITAMIN B-12 PO) Take 2,500 mcg by mouth daily.    [provider]  empagliflozin (JARDIANCE) 10 MG TABS tablet Take 1 tablet (10 mg total) by mouth daily. 01/18/23 02/17/23  Arrien, York Ram, MD  feeding supplement (ENSURE ENLIVE / ENSURE PLUS) LIQD Take 237 mLs by mouth 2 (two) times daily between meals. 01/18/23 02/17/23  Arrien, York Ram, MD  ferrous sulfate 325 (65 FE) MG tablet Take 1 tablet (325 mg total) by mouth daily with breakfast. 01/19/23 02/18/23  Arrien, York Ram, MD  fluticasone [facility]) 50 MCG/ACT nasal spray Place 1 spray into both nostrils daily.    [provider]  furosemide (LASIX) 40 MG tablet Take 1 tablet (40 mg total) by mouth 2 (two) times daily. 01/18/23 02/17/23  Arrien, York Ram, MD  Magnesium Citrate 200 MG TABS Take 200 mg by  mouth daily.    [provider]  metoprolol tartrate (LOPRESSOR) 25 MG tablet Take 1 tablet (25 mg total) by mouth daily. 02/06/22     Multiple Vitamin (MULTIVITAMIN WITH MINERALS) TABS tablet Take 1 tablet by mouth daily. 01/19/23 02/18/23  Arrien, York Ram, MD  nitroGLYCERIN (NITROSTAT) 0.4 MG SL tablet Place 1 tablet (0.4 mg total) under the tongue every 5 (five) minutes as needed for chest pain. 11/14/22   Runell Gess, MD  Omega-3 Fatty Acids (FISH OIL) 1000 MG CAPS Take 1,000 mg by mouth every other day. In the evening, alternating with fenofibrate acid    [provider]  polyethylene glycol powder (GLYCOLAX/MIRALAX) 17 GM/SCOOP powder Take 17 g by mouth daily. 01/18/23   Arrien, York Ram, MD  sertraline (ZOLOFT) 25 MG tablet Take 1 tablet (25 mg total) by mouth daily. 12/19/22     spironolactone (ALDACTONE) 25 MG tablet Take 0.5 tablets (12.5 mg total) by mouth daily. 01/19/23 02/18/23  Arrien, York Ram, MD  terbinafine (LAMISIL) 250 MG tablet Take 1 tablet (250 mg total) by mouth daily with food. 11/02/22     triamcinolone cream (KENALOG) 0.1 % Apply topically to the affected area 2 (two) times daily for 2 to 3 weeks 07/03/22       Family History    Family History  Problem Relation Age of Onset   Hypertension Mother    Heart disease Father    Hypertension Father    Leukemia Father    Pancreatic cancer Brother    He indicated that his mother is deceased. He indicated that his father is deceased. He indicated that the status of his brother is unknown.  Social History    Social History   Socioeconomic History   Marital status: Married    Spouse name: Not on file   Number of children: Not on file   Years of education: Not on file   Highest education level: Not on file  Occupational History   Not on file  Tobacco Use   Smoking status: Never   Smokeless tobacco: Never  Substance and Sexual Activity   Alcohol use: No   Drug use: No    Sexual activity: Not on file  Other Topics Concern   Not on file  Social History Narrative   Not on file   Social Determinants of Health   Financial Resource Strain: Low Risk  (01/15/2023)   Overall  Financial Resource Strain (CARDIA)    Difficulty of Paying Living Expenses: Not very hard  Food Insecurity: No Food Insecurity (01/12/2023)   Hunger Vital Sign    Worried About Running Out of Food in the Last Year: Never true    Ran Out of Food in the Last Year: Never true  Transportation Needs: No Transportation Needs (01/12/2023)   PRAPARE - Administrator, Civil Service (Medical): No    Lack of Transportation (Non-Medical): No  Recent Concern: Transportation Needs - Unmet Transportation Needs (12/12/2022)   Received from [facility]   Transportation    In the past 12 months, has lack of reliable transportation kept you from medical appointments, meetings, work or from getting things needed for daily living? : Yes  Physical Activity: Not on file  Stress: Not on file  Social Connections: Not on file  Intimate Partner Violence: Not At Risk (01/12/2023)   Humiliation, Afraid, Rape, and Kick questionnaire    Fear of Current or Ex-Partner: No    Emotionally Abused: No    Physically Abused: No    Sexually Abused: No     Review of Systems    General:  No chills, fever, night sweats or weight changes.  Cardiovascular:  No chest pain, dyspnea on exertion, edema, orthopnea, palpitations, paroxysmal nocturnal dyspnea. Dermatological: No rash, lesions/masses Respiratory: No cough, dyspnea Urologic: No hematuria, dysuria Abdominal:   No nausea, vomiting, diarrhea, bright red blood per rectum, melena, or hematemesis Neurologic:  No visual changes, wkns, changes in mental status. All other systems reviewed and are otherwise negative except as noted above.  Physical Exam    VS:  There were no vitals taken for this visit. , BMI There is no height or weight on file to calculate  BMI. GEN: Well nourished, well developed, in no acute distress. HEENT: normal. Neck: Supple, no JVD, carotid bruits, or masses. Cardiac: RRR, no murmurs, rubs, or gallops. No clubbing, cyanosis, edema.  Radials/DP/PT 2+ and equal bilaterally.  Respiratory:  Respirations regular and unlabored, clear to auscultation bilaterally. GI: Soft, nontender, nondistended, BS + x 4. MS: no deformity or atrophy. Skin: warm and dry, no rash. Neuro:  Strength and sensation are intact. Psych: Normal affect.  Accessory Clinical Findings    Recent Labs: 01/03/2023: TSH 2.052 01/12/2023: Magnesium 2.3 01/24/2023: B Natriuretic Peptide 87.3 02/23/2023: ALT 27 02/26/2023: BUN 22; Creatinine, Ser 1.21; Hemoglobin 11.9; Platelets 350; Potassium 3.7; Sodium 133   Recent Lipid Panel    Component Value Date/Time   CHOL 70 01/15/2023 0258   CHOL 103 11/15/2022 0837   TRIG 273 (H) 01/15/2023 0258   HDL <10 (L) 01/15/2023 0258   HDL 24 (L) 11/15/2022 0837   CHOLHDL NOT CALCULATED 01/15/2023 0258   VLDL 55 (H) 01/15/2023 0258   LDLCALC NOT CALCULATED 01/15/2023 0258   LDLCALC 59 11/15/2022 0837         ECG personally reviewed by me today-      Echocardiogram 01/04/2023  IMPRESSIONS     1. Left ventricular ejection fraction, by estimation, is 65 to 70%. The  left ventricle has hyperdynamic function. The left ventricle has no  regional wall motion abnormalities. There is moderate asymmetric left  ventricular hypertrophy of the septal  segment. LV outflow tract gradient peak 54 mmHg. Left ventricular  diastolic parameters are consistent with Grade I diastolic dysfunction  (impaired relaxation).   2. Right ventricular systolic function is normal. The right ventricular  size is normal. There is normal pulmonary  artery systolic pressure. The  estimated right ventricular systolic pressure is 27.2 mmHg.   3. Left atrial size was mildly dilated.   4. There is mitral chordal and probably valvular  systolic anterior  motion. The mitral valve is abnormal. Trivial mitral valve regurgitation.  No evidence of mitral stenosis.   5. The aortic valve is tricuspid. Aortic valve regurgitation is not  visualized.   6. Aortic dilatation noted. There is mild dilatation of the aortic root,  measuring 38 mm.   7. The inferior vena cava is dilated in size with <50% respiratory  variability, suggesting right atrial pressure of 15 mmHg.   8. Findings consistent with HOCM.   FINDINGS   Left Ventricle: Left ventricular ejection fraction, by estimation, is 65  to 70%. The left ventricle has hyperdynamic function. The left ventricle  has no regional wall motion abnormalities. The left ventricular internal  cavity size was normal in size.  There is moderate asymmetric left ventricular hypertrophy of the septal  segment. Left ventricular diastolic parameters are consistent with Grade I  diastolic dysfunction (impaired relaxation).   Right Ventricle: The right ventricular size is normal. No increase in  right ventricular wall thickness. Right ventricular systolic function is  normal. There is normal pulmonary artery systolic pressure. The tricuspid  regurgitant velocity is 1.75 m/s, and   with an assumed right atrial pressure of 15 mmHg, the estimated right  ventricular systolic pressure is 27.2 mmHg.   Left Atrium: Left atrial size was mildly dilated.   Right [facility]: Right atrial size was normal in size.   Pericardium: There is no evidence of pericardial effusion.   Mitral Valve: There is mitral chordal and probably valvular systolic  anterior motion. The mitral valve is abnormal. Trivial mitral valve  regurgitation. No evidence of mitral valve stenosis.   Tricuspid Valve: The tricuspid valve is normal in structure. Tricuspid  valve regurgitation is trivial.   Aortic Valve: The aortic valve is tricuspid. Aortic valve regurgitation is  not visualized.   Pulmonic Valve: The pulmonic valve was  normal in structure. Pulmonic valve  regurgitation is not visualized.   Aorta: Aortic dilatation noted. There is mild dilatation of the aortic  root, measuring 38 mm.   Venous: The inferior vena cava is dilated in size with less than 50%  respiratory variability, suggesting right atrial pressure of 15 mmHg.   IAS/Shunts: No atrial level shunt detected by color flow Doppler.       Assessment & Plan   1.  Dizziness-contacted nurse triage line on 01/31/2023.  Reported a 10 pound weight loss within a week.  Hospital admission 01/12/2023 with CHF exacerbation.  He required IV diuresis.   Increase p.o. hydration-recommended 36-48 ounces daily Slightly increase sodium in his diet Change positions slowly  Chronic diastolic CHF-weight today 181 pounds which is down from 186 pounds on 01/24/2023.  Breathing at baseline.  Appears to be over diuresed. Hold furosemide x 2 days Continue Jardiance, metoprolol, spironolactone Continue Daily weights Order BMP  Essential hypertension-BP today 94/56. Continue current medical therapy Maintain blood pressure log Continue low-sodium diet  Coronary artery disease-no chest pain today.  Denies exertional chest discomfort. Continue aspirin, atorvastatin, metoprolol Heart healthy low-sodium high-fiber diet Increase physical activity as tolerated  Hyperlipidemia-continue atorvastatin. High-fiber diet Omega-3 fatty acids, a atorvastatin, aspirin  Disposition: Follow-up with Dr.Berry or me in 3-6 weeks.   Thomasene Ripple. Annika Selke NP-C     02/27/2023, 7:49 AM [facility] [address]  Office [phone] Fax [phone]    I spent 14*** minutes examining this patient, reviewing medications, and using patient centered shared decision making involving her cardiac care.   I spent greater than 20 minutes reviewing her past medical history,  medications, and prior cardiac tests.

## 2023-02-27 NOTE — Progress Notes (Signed)
Patient with significant pain with wrist flexion and some discomfort with extension. No pain on palpation of wrist, elbow or shoulder. Able to range shoulder and elbow without pain. Some discomfort with full extension at elbow. Wrist brace was loose and not secured--now providing support and question if he's using it appropriately.  Will order X ray of wrist and forearm to rule out any pathology.

## 2023-02-27 NOTE — Progress Notes (Signed)
Physical Therapy Session Note  Patient Details  Name: Jeffrey Acevedo MRN: [id number] Date of Birth: [birth year]  Today's Date: 02/27/2023 PT Individual Time: 0807-0918 PT Individual Time Calculation (min): 71 min   Short Term Goals: Week 1:  PT Short Term Goal 1 (Week 1): Pt will transfer sup <> sit w/ mod A consistently. PT Short Term Goal 2 (Week 1): Pt will transfer sit to stand w/ max A. PT Short Term Goal 3 (Week 1): Pt will maintain seated balance > 5' at EOB. PT Short Term Goal 4 (Week 1): Pt will transfer SPT w/ max A +1. PT Short Term Goal 5 (Week 1): PT to assess gait.  Skilled Therapeutic Interventions/Progress Updates: Patient supine in bed with wife present on entrance to room. Patient alert and agreeable to PT session.   Patient reported no pain at beginning of session, but did state that R UE has been feeling weaker. Pt reported unrated pain with R UE when using it during transfers, especially when in slight abduction and internal rotation. PTA palpated for knots in upper R trap musculature, but hard to assess trigger points without further clarification from pt's enlarged nodules throughout body.  Pt also presented with increased confusion and lethargy this session. Pt's wife reported that pt had not slept well the previous night.  Therapeutic Activity: Bed Mobility: Pt bridge while in bed in order to donn personal pants (PTA threaded up to B knees). Pt required VC to flex at knees to bridge (PTA stabilizing). Pt required hand placement of L UE to waist band of pants to don as pt seemingly to be confused and slow to initiate movement to task. Pt wife assisted while PTA stabilized B LE's with VC for pt to bridge hips with modA to donn pants. Pt then performed supine<>sit on EOB with maxA (+1 at beginning of session, and plus 2 and end of session for safety due to pt's increase in lethargy). Pt required VC to advance B LE's (slow to initiate) and to use L UE to push off of bed.   Transfers: Pt performed sit<>stand transfers throughout session via STEDY and with modA +2 for safety. Provided VC for anterior lean to bring L UE onto STEDY bar. Pt required increased time to process command (pt also unable to bring R UE onto bar vs ability to do so on previous days).  Neuromuscular Re-ed: NMR facilitated during session with focus on static/dynamic sitting balance, static standing balance, attention to task, midline orientation. - Pt performed standing tolerance with B UE support on STEDY bar, as well as sitting balance with focus on midline orientation while high parched in STEDY. Pt required max cues to avoid heavy lean to the R (and L, but mostly to the R this session). Pt moved to being in front of mirror to provide visual feedback of orientation deficit with some increase in ability to adjust back to center, but pt also required max multimodal cues to bring L UE onto lap when in hip parched sitting as to avoid pushing on bar, leading to R lean (pt unable to provide sustained focus to task, and would quickly bring extremity back onto bar). Pt also required max multimodal cues to stand upright through hip extension and B quadriceps activation (pt still unable to provide required sustained focus to task). All done in room, so minimally distractive environment (wife and rehab tech present). Pt with seated rest break in [facility] as pt presented with decrease in participation and lethargy. When  cued to stand in order to remove sitting pads in STEDY, but attempts to squat lower while pushing with L UE on bar vs pulling and standing (pt eventually stood with PTA facilitating initiation of movement for pt to process). Pt then performed sit to stand back to STEDY with modA + 1 and slight increase in participation and cue following (after short rest break in [facility]). Pt transported back to EOB in STEDY for end of session.   NMR performed for improvements in motor control and coordination, balance,  sequencing, judgement, and self confidence/ efficacy in performing all aspects of mobility at highest level of independence.   Patient supine in bed at end of session with brakes locked, wife present, bed alarm set, and all needs within reach.      Therapy Documentation Precautions:  Precautions Precautions: Fall, Other (comment) Precaution Comments: strong posterior and pusher Restrictions Weight Bearing Restrictions: No  Therapy/Group: Individual Therapy  Jahlani Lorentz PTA 02/27/2023, 12:40 PM

## 2023-02-28 ENCOUNTER — Inpatient Hospital Stay (HOSPITAL_COMMUNITY): Payer: Medicare PPO

## 2023-02-28 DIAGNOSIS — I509 Heart failure, unspecified: Secondary | ICD-10-CM | POA: Diagnosis not present

## 2023-02-28 DIAGNOSIS — C8338 Diffuse large B-cell lymphoma, lymph nodes of multiple sites: Secondary | ICD-10-CM | POA: Diagnosis not present

## 2023-02-28 DIAGNOSIS — C8511 Unspecified B-cell lymphoma, lymph nodes of head, face, and neck: Secondary | ICD-10-CM

## 2023-02-28 DIAGNOSIS — I639 Cerebral infarction, unspecified: Secondary | ICD-10-CM | POA: Diagnosis not present

## 2023-02-28 DIAGNOSIS — C8599 Non-Hodgkin lymphoma, unspecified, extranodal and solid organ sites: Secondary | ICD-10-CM

## 2023-02-28 DIAGNOSIS — F01B4 Vascular dementia, moderate, with anxiety: Secondary | ICD-10-CM | POA: Diagnosis not present

## 2023-02-28 NOTE — Progress Notes (Signed)
Speech Language Pathology Daily Session Note  Patient Details  Name: Jeffrey Acevedo MRN: [id number] Date of Birth: 06-24-1940  {chl ip rehab slp time calculations:[id number]}  Short Term Goals: Week 1: SLP Short Term Goal 1 (Week 1): Patient will utilize swallowing compensatory strategies during consumption of D3/thin liquid diet given min multimodal A SLP Short Term Goal 2 (Week 1): Patient will demonstrate orientation to self, place, and situation given max multimodal A SLP Short Term Goal 3 (Week 1): Patient will increase speech intelligibility to 80% at the phrase level given mod multimodal A SLP Short Term Goal 4 (Week 1): Patient will recall biographical information given max multimodal A SLP Short Term Goal 5 (Week 1): Patient will demonstrate focused attention during functional therapeutic tasks given max multimodal A SLP Short Term Goal 6 (Week 1): Patient will demonstrate awareness of current medical situation given max multimodal A  Skilled Therapeutic Interventions: ***  Pain ***  Therapy/Group: Individual Therapy Tanasia Budzinski M.A., CF-SLP 02/28/2023, 9:30 PM

## 2023-02-28 NOTE — Consult Note (Addendum)
[facility]cer [facility] CONSULT NOTE  Patient Care Team: [name], MD as PCP - General (Internal Medicine) Runell Gess, MD as PCP - Cardiology (Cardiology)  CHIEF COMPLAINTS/PURPOSE OF CONSULTATION:  B Cell Lymphoma  REFERRING PHYSICIAN: Dr. Wynn Banker  HISTORY OF PRESENTING ILLNESS:  Jeffrey Acevedo 82 y.o. male who reportedly had a stroke on 02/08/2023.  Patient was admitted to Rehab on 02/15/2023 with complaints of right-sided weakness and inability to feed himself.  It is noted in review of notes that patient has had issues with hypoxia for over 1 year and has been requiring oxygen.  Patient has been managed by Rehab Medicine and has had PT, OT, and speech therapy ongoing. In addition, patient had multiple nodules over his neck, chest, bilateral shoulders, and bilateral extremities.  He was seen by Dermatology who biopsied a nodule.  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    Past medical history significant for CHF, CAD, CKD, dementia, and prostate cancer.  Patient with significant hearing loss wears hearing aids. Surgical oncologic history is significant for prostate cancer surgery diagnosed 2018. Family history includes father with leukemia, brother with pancreatic cancer. Social history is noncontributory, denies alcohol use, denies tobacco use, denies illicit drug use. Patient seen today awake and alert laying supine in bed. Patient is oriented to person, place, and time during this assessment. Denies pain, no acute complaints.   I have reviewed his chart and materials related to his cancer extensively and collaborated history with the patient. Summary of oncologic history is as follows: Oncology History   No history exists.    ASSESSMENT & PLAN:  B cell lymphoma -Patient with multiple subcutaneous erythematous and hard nodules on his neck, chest, bilateral upper extremities, bilateral shoulders, over his abdomen, and bil lower extremities.   Patient states it is not painful however itches at times.  -Biopsy done by Derm showed B cell lymphoma. -Patient to be seen by Dr. Pennie Rushing Onc in consultation.   -Recommend goals of care discussion with family and patient. -Referral will be made to the cancer center in [place]/likely Dr. Jonetta Speak as patient lives there.    2.  History of prostate cancer -Diagnosed 2018  3.  Stroke -Reportedly November 2024.  Has been managed by rehab medicine.  -Continue PT, OT, and speech therapy. -Rehab Medicine managing  4. CHF/CAD -s/p card cath October 2024 -managed by Cardio  5.  Chronic respiratory failure/hypoxia -Patient has been using oxygen at home -Monitor respiratory status closely  5.  Other:  Dementia Hard of hearing Sacral wound -continue wound care -encourage good nutrition -monitor fever curve -Provide supportive care as needed   Orders Placed This Encounter  Procedures   Urine Culture (for pregnant, neutropenic or urologic patients or patients with an indwelling urinary catheter)    Standing Status:   Standing    Number of Occurrences:   1    Order Specific Question:   Indication    Answer:   Altered mental status (if no other cause identified)   DG Chest 2 View    Standing Status:   Standing    Number of Occurrences:   1    Order Specific Question:   Symptom/Reason for Exam    Answer:   Hypoxia [300808]   DG Shoulder Right    Standing Status:   Standing    Number of Occurrences:   1    Order Specific Question:   Symptom/Reason for Exam    Answer:  Hypoxia [300808]    Order Specific Question:   Call Results- Best Contact Number?    Answer:   pain right biceps area   DG Wrist Complete Right    Standing Status:   Standing    Number of Occurrences:   1    Order Specific Question:   Symptom/Reason for Exam    Answer:   Hypoxia [300808]   DG Abd 1 View    Standing Status:   Standing    Number of Occurrences:   1    Order Specific Question:   Symptom/Reason  for Exam    Answer:   Incontinence of bowel [829562]   DG Wrist Complete Right    Standing Status:   Standing    Number of Occurrences:   1    Order Specific Question:   Symptom/Reason for Exam    Answer:   Pain [[id number]]   DG Forearm Right    Recent fall    Standing Status:   Standing    Number of Occurrences:   1    Order Specific Question:   Symptom/Reason for Exam    Answer:   Pain [[id number]]   DG Swallowing Func-Speech Pathology    Cassidi [phone]    Standing Status:   Standing    Number of Occurrences:   1    Order Specific Question:   Reason for exam:    Answer:   dysphagia s/sx of asp   Comprehensive metabolic panel    Standing Status:   Standing    Number of Occurrences:   1   Basic metabolic panel    Standing Status:   Standing    Number of Occurrences:   10   CBC with Differential/Platelet    Standing Status:   Standing    Number of Occurrences:   1   CBC    Baseline for enoxaparin therapy IF NOT ALREADY DRAWN.  Notify MD if PLT < 100 K.    Standing Status:   Standing    Number of Occurrences:   10   Vitamin B12    Standing Status:   Standing    Number of Occurrences:   1   Urinalysis, Routine w reflex microscopic -Urine, Clean Catch    Standing Status:   Standing    Number of Occurrences:   1    Order Specific Question:   Specimen Source    Answer:   Urine, Clean Catch [76]   DIET DYS 3 Room service appropriate? Yes; Fluid consistency: Thin    Standing Status:   Standing    Number of Occurrences:   1    Order Specific Question:   Room service appropriate?    Answer:   Yes    Order Specific Question:   Fluid consistency:    Answer:   Thin   Vital signs    Standing Status:   Standing    Number of Occurrences:   1   Notify physician (specify) if oral intake is less than 1000 ml within 24 hours    if oral intake is less than 1000 ml within 24 hours    Standing Status:   Standing    Number of Occurrences:   1   Activity as tolerated    Standing Status:    Standing    Number of Occurrences:   1   Assess, measure and document wounds on admission and then weekly on Wednesdays    Standing Status:   Standing  Number of Occurrences:   1   Refer to Wound Care Standing order set for wound care, if needed.    Contact provider for instructions on wound care for wounds not covered in the Wound Care Standing order set.    Standing Status:   Standing    Number of Occurrences:   1   Obtain provider order for WOC Consult for patients with Stage 3,4, unstageable and deep tissue pressure injuries    Standing Status:   Standing    Number of Occurrences:   1   Intake and Output    Standing Status:   Standing    Number of Occurrences:   1   Bladder program    Toilet patient    Standing Status:   Standing    Number of Occurrences:   83   Initiate Oral Care Protocol    Standing Status:   Standing    Number of Occurrences:   1   Initiate Carrier Fluid Protocol    Standing Status:   Standing    Number of Occurrences:   1   Strict intake and output    Standing Status:   Standing    Number of Occurrences:   1   Daily weights    Standing Status:   Standing    Number of Occurrences:   1   Outside Vendor Brace    Standing Status:   Standing    Number of Occurrences:   1    Order Specific Question:   Weight:    Answer:   80.3 kg    Order Specific Question:   Height:    Answer:   6'    Order Specific Question:   Type of Brace:    Answer:   Orthopedic    Order Specific Question:   Ortho Brace Type:    Answer:   Other    Order Specific Question:   Other Ortho Brace    Answer:   wrist brace    Order Specific Question:   Other Ortho Brace Laterality    Answer:   Right    Order Specific Question:   Other Ortho Brace Size    Answer:   Large    Order Specific Question:   Quantity    Answer:   1   Bladder scan    Once every 4-6 hours and cath for volumes > 350 cc    Standing Status:   Standing    Number of Occurrences:   1   Measure post void  residual    Once every 4-6 hours and cath for volumes > 350cc    Standing Status:   Standing    Number of Occurrences:   1   In and Out Cath    Prn no void in 6 hours or PVR > 350 cc. Use coude cath    Standing Status:   Standing    Number of Occurrences:   99999    Standing Expiration Date:   03/14/2023   Mattress Replacement    Standing Status:   Standing    Number of Occurrences:   1   3 - Step skin care regimen: Cleaner, Moisturizer, Skin Protectant    And PRN incontinence of urine or stool    Standing Status:   Standing    Number of Occurrences:   1   Inform provider of Stage 3, 4, Deep Tissue, and/or Unstageable Pressure Injury and obtain WOC consult order ([id number])  Standing Status:   Standing    Number of Occurrences:   1   Refer to Sidebar Report: Medical Device Related Pressure Injury Prevention    Medical Device Related Pressure Injury Prevention    Standing Status:   Standing    Number of Occurrences:   1   Pressure redistribution chair pad if out of bed and has any type of wound or MASD on the sacrum/coccyx/buttocks, or if patient will be elevating the feet while in the chair.    Standing Status:   Standing    Number of Occurrences:   1   Consider obtaining physician's order for mattress replacement with low air loss feature for moisture / microclimate management.    Standing Status:   Standing    Number of Occurrences:   1   If incontinent of urine and/or stool, consider using an external urinary collection system and/or external fecal collection pouch.    Standing Status:   Standing    Number of Occurrences:   1   If incontinent of stool, consider obtaining physician's order for a fecal containment device.    Standing Status:   Standing    Number of Occurrences:   1   Safety Observation    Standing Status:   Standing    Number of Occurrences:   1    Order Specific Question:   Patient behavior assessment    Answer:   High fall risk    Order Specific Question:    Patient behavior assessment    Answer:   Confused    Order Specific Question:   Select physical/physiological needs that have been assessed and addressed    Answer:   Bowel/Bladder    Order Specific Question:   Select physical/physiological needs that have been assessed and addressed    Answer:   Missing eye glasses/hearing aid    Order Specific Question:   Select physical/physiological needs that have been assessed and addressed    Answer:   Pain    Order Specific Question:   Select interventions that have been implemented    Answer:   Bed Alarm Activated    Order Specific Question:   Select interventions that have been implemented    Answer:   Redirect patient    Order Specific Question:   Select interventions that have been implemented    Answer:   Patient closer to station    Order Specific Question:   Select interventions that have been implemented    Answer:   Lap belt while in chair/wheelchair (Rehab only)    Order Specific Question:   Staff nurse/charge nurse to reassess safety requirement every 12 hours    Answer:   2 = Medium (increased aggression, frequent reminders, redirectable)    Order Specific Question:   Has mobile tele-monitoring been trialed    Answer:   Not yet trialed    Order Specific Question:   Type of observation    Answer:   Tele-monitoring   Delirium Precautions    Standing Status:   Standing    Number of Occurrences:   1    Order Specific Question:   Precautions    Answer:   Low music or white noise as desired-avoid TV unless requested by patient or family.    Order Specific Question:   Precautions    Answer:   Keep voices quiet in room and immediately outside of room.    Order Specific Question:   Precautions    Answer:  Address the patient gently (Comment: provide reorientation and reassurance.)    Order Specific Question:   Precautions    Answer:   Explain interventions (Comment: even if minimally responsive.)    Order Specific Question:   Precautions     Answer:   Minimize devices if patient is pulling on lines such as indwelling urinary catheter (Comment: telemetry and IV-address with attending if acute agitation occurs.)    Order Specific Question:   Precautions    Answer:   Avoid PM interruptions while patient is sleeping including labs (Comment: procedures)    Order Specific Question:   Precautions    Answer:   Provide adequate lighting during the day/open blinds and dim lighting in evening.    Order Specific Question:   Precautions    Answer:   Ensure hearing aids and eyeglasses are utilized.    Order Specific Question:   Precautions    Answer:   Promote early and frequent mobilization when possible.    Order Specific Question:   Precautions    Answer:   Avoid physical restraints including mitts.   Safety Observation    Standing Status:   Standing    Number of Occurrences:   1    Order Specific Question:   Patient behavior assessment    Answer:   High fall risk    Order Specific Question:   Patient behavior assessment    Answer:   Confused    Order Specific Question:   Select physical/physiological needs that have been assessed and addressed    Answer:   Bowel/Bladder    Order Specific Question:   Select physical/physiological needs that have been assessed and addressed    Answer:   Missing eye glasses/hearing aid    Order Specific Question:   Select physical/physiological needs that have been assessed and addressed    Answer:   Pain    Order Specific Question:   Select interventions that have been implemented    Answer:   Bed Alarm Activated    Order Specific Question:   Select interventions that have been implemented    Answer:   Redirect patient    Order Specific Question:   Select interventions that have been implemented    Answer:   Patient closer to station    Order Specific Question:   Select interventions that have been implemented    Answer:   Lap belt while in chair/wheelchair (Rehab only)    Order Specific  Question:   Staff nurse/charge nurse to reassess safety requirement every 12 hours    Answer:   2 = Medium (increased aggression, frequent reminders, redirectable)    Order Specific Question:   Has mobile tele-monitoring been trialed    Answer:   Not yet trialed    Order Specific Question:   Type of observation    Answer:   Tele-monitoring   Safety Observation    Standing Status:   Standing    Number of Occurrences:   1    Order Specific Question:   Patient behavior assessment    Answer:   High fall risk    Order Specific Question:   Patient behavior assessment    Answer:   Confused    Order Specific Question:   Select physical/physiological needs that have been assessed and addressed    Answer:   Bowel/Bladder    Order Specific Question:   Select physical/physiological needs that have been assessed and addressed    Answer:   Missing eye glasses/hearing aid  Order Specific Question:   Select physical/physiological needs that have been assessed and addressed    Answer:   Pain    Order Specific Question:   Select interventions that have been implemented    Answer:   Bed Alarm Activated    Order Specific Question:   Select interventions that have been implemented    Answer:   Redirect patient    Order Specific Question:   Select interventions that have been implemented    Answer:   Patient closer to station    Order Specific Question:   Select interventions that have been implemented    Answer:   Lap belt while in chair/wheelchair (Rehab only)    Order Specific Question:   Staff nurse/charge nurse to reassess safety requirement every 12 hours    Answer:   2 = Medium (increased aggression, frequent reminders, redirectable)    Order Specific Question:   Has mobile tele-monitoring been trialed    Answer:   Not yet trialed    Order Specific Question:   Type of observation    Answer:   Tele-monitoring   Safety Observation    Standing Status:   Standing    Number of Occurrences:   1     Order Specific Question:   Patient behavior assessment    Answer:   High fall risk    Order Specific Question:   Patient behavior assessment    Answer:   Confused    Order Specific Question:   Select physical/physiological needs that have been assessed and addressed    Answer:   Bowel/Bladder    Order Specific Question:   Select physical/physiological needs that have been assessed and addressed    Answer:   Missing eye glasses/hearing aid    Order Specific Question:   Select physical/physiological needs that have been assessed and addressed    Answer:   Pain    Order Specific Question:   Select interventions that have been implemented    Answer:   Bed Alarm Activated    Order Specific Question:   Select interventions that have been implemented    Answer:   Redirect patient    Order Specific Question:   Select interventions that have been implemented    Answer:   Patient closer to station    Order Specific Question:   Select interventions that have been implemented    Answer:   Lap belt while in chair/wheelchair (Rehab only)    Order Specific Question:   Staff nurse/charge nurse to reassess safety requirement every 12 hours    Answer:   2 = Medium (increased aggression, frequent reminders, redirectable)    Order Specific Question:   Has mobile tele-monitoring been trialed    Answer:   Not yet trialed    Order Specific Question:   Type of observation    Answer:   Tele-monitoring   Safety Observation    Standing Status:   Standing    Number of Occurrences:   1    Order Specific Question:   Patient behavior assessment    Answer:   High fall risk    Order Specific Question:   Patient behavior assessment    Answer:   Confused    Order Specific Question:   Select physical/physiological needs that have been assessed and addressed    Answer:   Bowel/Bladder    Order Specific Question:   Select physical/physiological needs that have been assessed and addressed    Answer:   Missing eye  glasses/hearing aid    Order Specific Question:   Select physical/physiological needs that have been assessed and addressed    Answer:   Pain    Order Specific Question:   Select interventions that have been implemented    Answer:   Bed Alarm Activated    Order Specific Question:   Select interventions that have been implemented    Answer:   Redirect patient    Order Specific Question:   Select interventions that have been implemented    Answer:   Patient closer to station    Order Specific Question:   Select interventions that have been implemented    Answer:   Lap belt while in chair/wheelchair (Rehab only)    Order Specific Question:   Staff nurse/charge nurse to reassess safety requirement every 12 hours    Answer:   2 = Medium (increased aggression, frequent reminders, redirectable)    Order Specific Question:   Has mobile tele-monitoring been trialed    Answer:   Not yet trialed    Order Specific Question:   Type of observation    Answer:   Tele-monitoring   Complete oral care assessment tool on admission, transfer, and q shift    Standing Status:   Standing    Number of Occurrences:   1   Refer to Sidebar Report Adult Oral Care Protocol    Adult Oral Care Protocol    Standing Status:   Standing    Number of Occurrences:   1   Brush teeth with suction toothbrush 4 times daily    Standing Status:   Standing    Number of Occurrences:   1   Safety Observation    Standing Status:   Standing    Number of Occurrences:   1    Order Specific Question:   Patient behavior assessment    Answer:   High fall risk    Order Specific Question:   Patient behavior assessment    Answer:   Confused    Order Specific Question:   Select physical/physiological needs that have been assessed and addressed    Answer:   Bowel/Bladder    Order Specific Question:   Select physical/physiological needs that have been assessed and addressed    Answer:   Missing eye glasses/hearing aid    Order Specific  Question:   Select physical/physiological needs that have been assessed and addressed    Answer:   Pain    Order Specific Question:   Select interventions that have been implemented    Answer:   Bed Alarm Activated    Order Specific Question:   Select interventions that have been implemented    Answer:   Redirect patient    Order Specific Question:   Select interventions that have been implemented    Answer:   Patient closer to station    Order Specific Question:   Select interventions that have been implemented    Answer:   Lap belt while in chair/wheelchair (Rehab only)    Order Specific Question:   Staff nurse/charge nurse to reassess safety requirement every 12 hours    Answer:   2 = Medium (increased aggression, frequent reminders, redirectable)    Order Specific Question:   Has mobile tele-monitoring been trialed    Answer:   Not yet trialed    Order Specific Question:   Type of observation    Answer:   Tele-monitoring   Full code    Standing Status:   Standing  Number of Occurrences:   1    Order Specific Question:   By:    Answer:   Consent: discussion documented in EHR   Consult to oncology Consult Timeframe: ROUTINE - requires response within 24 hours; Reason for Consult? lymphoma    Standing Status:   Standing    Number of Occurrences:   1    Order Specific Question:   Consult Timeframe    Answer:   ROUTINE - requires response within 24 hours    Order Specific Question:   Reason for Consult?    Answer:   lymphoma   OT eval and treat    Standing Status:   Standing    Number of Occurrences:   1    Order Specific Question:   Reason for OT?    Answer:   for ADL, UE exercise   PT eval and treat    Standing Status:   Standing    Number of Occurrences:   1    Order Specific Question:   Reason for PT?    Answer:   for mobility, LE exercise   Incentive spirometry    Standing Status:   Standing    Number of Occurrences:   1   Oxygen therapy Mode or (Route): Nasal cannula;  Liters Per Minute: 2    Standing Status:   Standing    Number of Occurrences:   1    Order Specific Question:   Mode or (Route)    Answer:   Nasal cannula    Order Specific Question:   Liters Per Minute    Answer:   2   Oxygen therapy Mode or (Route): Nasal cannula; Liters Per Minute: 2; Keep O2 saturation between: > 88%    Use with activity and prn during the day    Standing Status:   Standing    Number of Occurrences:   20    Order Specific Question:   Mode or (Route)    Answer:   Nasal cannula    Order Specific Question:   Liters Per Minute    Answer:   2    Order Specific Question:   Keep O2 saturation between    Answer:   > 88%   Flutter valve    Standing Status:   Standing    Number of Occurrences:   80   SLP eval and treat    Standing Status:   Standing    Number of Occurrences:   1    Order Specific Question:   Reason for SLP?    Answer:   for language, dysphagia   SLP modified barium swallow    Cassidi [phone]    Standing Status:   Standing    Number of Occurrences:   1    Order Specific Question:   Reason for SLP?    Answer:   dysphagia - s/sx aspiration   EKG 12-Lead    Standing Status:   Standing    Number of Occurrences:   1    Order Specific Question:   Notes    Answer:   stroke   Admit to inpatient rehab    Standing Status:   Standing    Number of Occurrences:   1    Order Specific Question:   Diagnosis    Answer:   Stroke (cerebrum) [facility]) [[id number]]    Order Specific Question:   Admitting Physician    Answer:   Altamease Oiler    Order Specific Question:  Attending Physician    Answer:   Erick Colace [[id number]]     MEDICAL HISTORY:  Past Medical History:  Diagnosis Date   Acute on chronic diastolic CHF (congestive heart failure) (HCC) 01/03/2023   Cancer (HCC)    prostate cancer surgery 2011   Chronic diastolic heart failure (HCC)    Coronary artery disease    sees Dr. Erlene Quan 3 mths ago.  Stress test 03/31/2015 in epic    Degenerative joint disease    Right hip   Depression    Hearing loss of both ears    only at present wears the right ear hearing aid   Hyperlipidemia    Hypertension    Prostate cancer [facility])     SURGICAL HISTORY: Past Surgical History:  Procedure Laterality Date   CARDIAC CATHETERIZATION  06/17/2009   Proximal LAD 90% stenosis just beyond previously stented segment with a Multi-[employer] Vision bare-metal stent   CORONARY ANGIOPLASTY     stents placed in 1998 and 2011    CORONARY PRESSURE/FFR STUDY N/A 05/02/2021   Procedure: INTRAVASCULAR PRESSURE WIRE/FFR STUDY;  Surgeon: Runell Gess, MD;  Location: [facility] INVASIVE CV [facility];  Service: Cardiovascular;  Laterality: N/A;   CORONARY PRESSURE/FFR STUDY N/A 09/19/2021   Procedure: INTRAVASCULAR PRESSURE WIRE/FFR STUDY;  Surgeon: Runell Gess, MD;  Location: [facility] INVASIVE CV LAB;  Service: Cardiovascular;  Laterality: N/A;  RAMUS   CORONARY STENT INTERVENTION N/A 05/02/2021   Procedure: CORONARY STENT INTERVENTION;  Surgeon: Runell Gess, MD;  Location: [facility] INVAS[facility] CV [facility];  Service: Cardiovascular;  Laterality: N/A;   CORONARY STENT INTERVENTION N/A 09/19/2021   Procedure: CORONARY STENT INTERVENTION;  Surgeon: Runell Gess, MD;  [facility];  Service: Cardiovascular;  Laterality: N/A;   CORONARY STENT INTERVENTION N/A 12/29/2022   Procedure: CORONARY STENT INTERVENTION;  Surgeon: Kathleene Hazel, MD;  Location: [facility];  Service: Cardiovascular;  Laterality: N/A;   LEFT HEART CATH AND CORONARY ANGIOGRAPHY N/A 05/02/2021   Procedure: LEFT HEART CATH AND CORONARY ANGIOGRAPHY;  Surgeon: Runell Gess, MD;  [facility];  Service: Cardiovascular;  Laterality: N/A;   LEFT HEART CATH AND CORONARY ANGIOGRAPHY N/A 09/19/2021   Procedure: LEFT HEART CATH AND CORONARY ANGIOGRAPHY;  Surgeon: Runell Gess, MD;  [facility];  Service: Cardiovascular;  Laterality: N/A;   NASAL  SEPTUM SURGERY     Dr. Richardson Landry @ [facility]  2014   [facility]  2011   RIGHT/LEFT HEART CATH AND CORONARY ANGIOGRAPHY N/A 12/29/2022   Procedure: RIGHT/LEFT HEART CATH AND CORONARY ANGIOGRAPHY;  Surgeon: Kathleene Hazel, MD;  [facility];  Service: Cardiovascular;  Laterality: N/A;   ROBOT ASSISTED LAPAROSCOPIC RADICAL PROSTATECTOMY     TOTAL HIP ARTHROPLASTY Right 04/19/2015   Procedure: RIGHT TOTAL HIP ARTHROPLASTY ANTERIOR APPROACH;  Surgeon: Jodi Geralds, MD;  Location: [facility];  Service: Orthopedics;  Laterality: Right;    SOCIAL HISTORY: Social History   Socioeconomic History   Marital status: Married    Spouse name: Not on file   Number of children: Not on file   Years of education: Not on file   Highest education level: Not on file  Occupational History   Not on file  Tobacco Use   Smoking status: Never   Smokeless tobacco: Never  Substance and Sexual Activity   Alcohol use: No   Drug use: No   Sexual activity: Not  on file  Other Topics Concern   Not on file  Social History Narrative   Not on file   Social Determinants of Health   Financial Resource Strain: Low Risk  (01/15/2023)   Overall Financial Resource Strain (CARDIA)    Difficulty of Paying Living Expenses: Not very hard  Food Insecurity: No Food Insecurity (01/12/2023)   Hunger Vital Sign    Worried About Running Out of Food in the Last Year: Never true    Ran Out of Food in the Last Year: Never true  Transportation Needs: No Transportation Needs (01/12/2023)   PRAPARE - Administrator, Civil Service (Medical): No    Lack of Transportation (Non-Medical): No  Recent Concern: Transportation Needs - Unmet Transportation Needs (12/12/2022)   Received from [facility]   Transportation    In the past 12 months, has lack of reliable transportation kept you from medical appointments, meetings, work or from getting things needed for daily living? : Yes  Physical  Activity: Not on file  Stress: Not on file  Social Connections: Not on file  Intimate Partner Violence: Not At Risk (01/12/2023)   Humiliation, Afraid, Rape, and Kick questionnaire    Fear of Current or Ex-Partner: No    Emotionally Abused: No    Physically Abused: No    Sexually Abused: No    FAMILY HISTORY: Family History  Problem Relation Age of Onset   Hypertension Mother    Heart disease Father    Hypertension Father    Leukemia Father    Pancreatic cancer Brother     REVIEW OF SYSTEMS:   Constitutional: +fatigue Denies fevers, chills or abnormal night sweats Eyes: Denies blurriness of vision, double vision or watery eyes Ears, nose, mouth, throat, and face: Denies mucositis or sore throat Respiratory: Denies cough, dyspnea or wheezes Cardiovascular: Denies palpitation, chest discomfort or lower extremity swelling Gastrointestinal: Denies nausea, heartburn or change in bowel habits Skin: +"bumps all over my body"  Lymphatics: Denies new lymphadenopathy or easy bruising Neurological: Denies numbness, tingling or new weaknesses Behavioral/Psych: +forgetfulness, Mood is stable, no new changes  All other systems were reviewed with the patient and are negative.  PHYSICAL EXAMINATION: ECOG PERFORMANCE STATUS: 3 - Symptomatic, >50% confined to bed  Vitals:   02/27/23 2011 02/28/23 0400  BP: 108/66 124/82  Pulse: (!) 51 (!) 53  Resp: 16 18  Temp: 98 F (36.7 C) 97.8 F (36.6 C)  SpO2: 95% 93%   Filed Weights   02/25/23 0617 02/26/23 0532 02/28/23 0400  Weight: 172 lb 2.9 oz (78.1 kg) 172 lb 6.4 oz (78.2 kg) 169 lb 12.1 oz (77 kg)    GENERAL: alert, no distress and comfortable SKIN: +pale, +multiple erythematous hardened nodules on face, bil UE/LE, chest, abdomen, back. +sacral wound EYES: normal, conjunctiva are pink and non-injected, sclera clear OROPHARYNX: no exudate, no erythema and lips, buccal mucosa, and tongue normal  NECK: supple, thyroid normal size,  non-tender, +nodules as described above LYMPH: no palpable lymphadenopathy in the cervical, axillary or inguinal LUNGS: clear to auscultation and percussion with normal breathing effort HEART: regular rate & rhythm and no murmurs and no lower extremity edema ABDOMEN: abdomen soft, non-tender and normal bowel sounds MUSCULOSKELETAL: +brace on right UE  PSYCH: alert & oriented x 3 with fluent speech NEURO: no focal motor/sensory deficits   ALLERGIES:  is allergic to adhesive [tape], hydrochlorothiazide, latex, and vantin [cefpodoxime].  MEDICATIONS:  Current Facility-Administered Medications  Medication Dose Route Frequency Provider Last  Rate Last Admin   acetaminophen (TYLENOL) tablet 325-650 mg  325-650 mg Oral Q4H PRN Jacquelynn Cree, PA-C   650 mg at 02/26/23 2013   alum & mag hydroxide-simeth (MAALOX/MYLANTA) 200-200-20 MG/5ML suspension 30 mL  30 mL Oral Q4H PRN Love, Pamela S, PA-C       aspirin EC tablet 81 mg  81 mg Oral Daily Jacquelynn Cree, PA-C   81 mg at 02/28/23 0803   atorvastatin (LIPITOR) tablet 40 mg  40 mg Oral QPC supper Jacquelynn Cree, PA-C   40 mg at 02/27/23 1756   bisacodyl (DULCOLAX) suppository 10 mg  10 mg Rectal Daily PRN Love, Pamela S, PA-C       clopidogrel (PLAVIX) tablet 75 mg  75 mg Oral Daily Jacquelynn Cree, PA-C   75 mg at 02/28/23 0865   cyanocobalamin (VITAMIN B12) tablet 2,500 mcg  2,500 mcg Oral Daily Jacquelynn Cree, PA-C   2,500 mcg at 02/28/23 7846   diphenhydrAMINE (BENADRYL) capsule 25 mg  25 mg Oral Q6H PRN Love, Pamela S, PA-C       empagliflozin (JARDIANCE) tablet 10 mg  10 mg Oral Daily Jacquelynn Cree, PA-C   10 mg at 02/28/23 0803   enoxaparin (LOVENOX) injection 40 mg  40 mg Subcutaneous Q24H Love, Pamela S, PA-C   40 mg at 02/27/23 2022   feeding supplement (ENSURE ENLIVE / ENSURE PLUS) liquid 237 mL  237 mL Oral BID BM Erick Colace, MD   237 mL at 02/27/23 0959   fenofibrate tablet 80 mg  80 mg Oral QHS Jacquelynn Cree, PA-C   80 mg at  02/27/23 2023   fluticasone (FLONASE) 50 MCG/ACT nasal spray 1 spray  1 spray Each Nare Daily Jacquelynn Cree, PA-C   1 spray at 02/28/23 0804   gabapentin (NEURONTIN) capsule 100 mg  100 mg Oral TID Love, Pamela S, PA-C   100 mg at 02/28/23 0803   guaiFENesin-dextromethorphan (ROBITUSSIN DM) 100-10 MG/5ML syrup 5-10 mL  5-10 mL Oral Q6H PRN Love, Pamela S, PA-C       iron polysaccharides (NIFEREX) capsule 150 mg  150 mg Oral Q lunch Jacquelynn Cree, PA-C   150 mg at 02/27/23 1308   latanoprost (XALATAN) 0.005 % ophthalmic solution 1 drop  1 drop Left Eye QHS Jacquelynn Cree, PA-C   1 drop at 02/27/23 2023   leptospermum manuka honey (MEDIHONEY) paste 1 Application  1 Application Topical Daily Erick Colace, MD   1 Application at 02/28/23 0804   lidocaine (XYLOCAINE) 2 % jelly   Topical PRN Love, Pamela S, PA-C       melatonin tablet 5 mg  5 mg Oral QHS PRN Love, Pamela S, PA-C   5 mg at 02/26/23 2013   metoprolol tartrate (LOPRESSOR) tablet 25 mg  25 mg Oral Q breakfast Jacquelynn Cree, PA-C   25 mg at 02/28/23 9629   multivitamin with minerals tablet 1 tablet  1 tablet Oral Daily Jacquelynn Cree, PA-C   1 tablet at 02/28/23 5284   Oral care mouth rinse  15 mL Mouth Rinse 4 times per day Erick Colace, MD   15 mL at 02/28/23 0804   Oral care mouth rinse  15 mL Mouth Rinse PRN Kirsteins, Victorino Sparrow, MD       polyethylene glycol (MIRALAX / GLYCOLAX) packet 17 g  17 g Oral Daily Jacquelynn Cree, PA-C   17 g at 02/28/23  6440   prochlorperazine (COMPAZINE) tablet 5-10 mg  5-10 mg Oral Q6H PRN Love, Pamela S, PA-C       Or   prochlorperazine (COMPAZINE) suppository 12.5 mg  12.5 mg Rectal Q6H PRN Love, Pamela S, PA-C       Or   prochlorperazine (COMPAZINE) injection 5-10 mg  5-10 mg Intravenous Q6H PRN Love, Pamela S, PA-C       sertraline (ZOLOFT) tablet 25 mg  25 mg Oral Daily Love, Pamela S, PA-C   25 mg at 02/28/23 3474   simethicone (MYLICON) chewable tablet 80 mg  80 mg Oral QID PRN Love,  Pamela S, PA-C       sodium chloride (OCEAN) 0.65 % nasal spray 1 spray  1 spray Each Nare BID AC Love, Pamela S, PA-C   1 spray at 02/26/23 1802   sodium phosphate (FLEET) enema 1 enema  1 enema Rectal Once PRN Love, Pamela S, PA-C       spironolactone (ALDACTONE) tablet 12.5 mg  12.5 mg Oral Daily Jacquelynn Cree, PA-C   12.5 mg at 02/28/23 0802     LABORATORY DATA:  I have reviewed the data as listed Lab Results  Component Value Date   WBC 7.6 02/26/2023   HGB 11.9 (L) 02/26/2023   HCT 37.8 (L) 02/26/2023   MCV 86.3 02/26/2023   PLT 350 02/26/2023   Recent Labs    04/18/22 1233 11/15/22 0837 12/13/22 1104 01/04/23 0218 01/05/23 0428 01/13/23 0233 01/14/23 0248 01/24/23 1542 02/02/23 1613 02/23/23 0631 02/26/23 0532  NA  --   --    < > 132*   < > 134*   < > 134* 134 132* 133*  K  --   --    < > 4.1   < > 4.1   < > 4.5 4.7 3.8 3.7  CL  --   --    < > 96*   < > 96*   < > 97* 94* 100 101  CO2  --   --    < > 23   < > 28   < > 28 28 22 24   GLUCOSE  --   --    < > 104*   < > 104*   < > 98 93 96 92  BUN  --   --    < > 32*   < > 27*   < > 27* 33* 22 22  CREATININE  --   --    < > 1.52*   < > 1.36*   < > 1.58* 1.55* 1.12 1.21  CALCIUM  --   --    < > 8.8*   < > 8.6*   < > 9.4 9.5 9.4 9.1  GFRNONAA  --   --    < > 46*   < > 52*   < > 44*  --  >60 >60  PROT 6.7 6.6  --  4.8*  --  5.1*  --   --   --  6.4*  --   ALBUMIN 4.5 4.2  --  2.4*  --  2.5*  --   --   --  3.0*  --   AST 20 18  --  22  --  27  --   --   --  25  --   ALT 14 12  --  16  --  15  --   --   --  27  --  ALKPHOS 59 62  --  32*  --  33*  --   --   --  45  --   BILITOT 1.2 1.2  --  0.6  --  0.8  --   --   --  0.8  --   BILIDIR 0.29 0.33  --   --   --   --   --   --   --   --   --    < > = values in this interval not displayed.    RADIOGRAPHIC STUDIES: I have personally reviewed the radiological images as listed and agreed with the findings in the report. DG Wrist Complete Right  Result Date:  02/27/2023 CLINICAL DATA:  Right upper extremity pain. EXAM: RIGHT WRIST - COMPLETE 3+ VIEW; RIGHT FOREARM - 2 VIEW COMPARISON:  None Available. FINDINGS: There is no acute fracture or dislocation. The bones are osteopenic. There is arthritic changes of the wrist with severe narrowing of the radiocarpal distance. The soft tissues are unremarkable. IMPRESSION: 1. No acute fracture or dislocation. 2. 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 Electronically Signed   By: Elgie Collard M.D.   On: 02/27/2023 18:18   DG Forearm Right  Result Date: 02/27/2023 CLINICAL DATA:  Right upper extremity pain. EXAM: RIGHT WRIST - COMPLETE 3+ VIEW; RIGHT FOREARM - 2 VIEW COMPARISON:  None Available. FINDINGS: There is no acute fracture or dislocation. The bones are osteopenic. There is arthritic changes of the wrist with severe narrowing of the radiocarpal distance. The soft tissues are unremarkable. IMPRESSION: 1. No acute fracture or dislocation. 2. 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 Electronically Signed   By: Elgie Collard M.D.   On: 02/27/2023 18:18   VAS [place] LOWER EXTREMITY VENOUS (DVT)  Result Date: 02/23/2023  Lower Venous DVT Study Patient Name:  Jeffrey Acevedo  Date of Exam:   02/23/2023 Medical Rec #: [id number]        Accession #:    [id number] Date of Birth: 1940/07/12        Patient Gender: M Patient Age:   53 years Exam Location:  [facility] Procedure:      VAS [place] LOWER EXTREMITY VENOUS (DVT) Referring Phys: PAMELA LOVE --------------------------------------------------------------------------------  Indications: Stroke. Other Indications: CHF. Comparison Study: Left lower extremity venous duplex on 10.16.2024. Performing Technologist: Fernande Bras  Examination Guidelines: A complete evaluation includes B-mode imaging, spectral Doppler, color Doppler, and power Doppler as needed of all accessible portions of each vessel. Bilateral testing is considered an integral part of  a complete examination. Limited examinations for reoccurring indications may be performed as noted. The reflux portion of the exam is performed with the patient in reverse Trendelenburg.  +---------+---------------+---------+-----------+----------+--------------+ RIGHT    CompressibilityPhasicitySpontaneityPropertiesThrombus Aging +---------+---------------+---------+-----------+----------+--------------+ CFV      Full           Yes      Yes                                 +---------+---------------+---------+-----------+----------+--------------+ SFJ      Full                                                        +---------+---------------+---------+-----------+----------+--------------+ FV Prox  Full                                                        +---------+---------------+---------+-----------+----------+--------------+ FV Mid   Full                                                        +---------+---------------+---------+-----------+----------+--------------+ FV DistalFull                                                        +---------+---------------+---------+-----------+----------+--------------+ PFV      Full                                                        +---------+---------------+---------+-----------+----------+--------------+ POP      Full           Yes      Yes                                 +---------+---------------+---------+-----------+----------+--------------+ PTV      Full                                                        +---------+---------------+---------+-----------+----------+--------------+ PERO     Full                                                        +---------+---------------+---------+-----------+----------+--------------+ Fluid collection noted in distal thigh measuring 2 x 1 x 2.1 cm and in superior calf measuing 2.7 x2.4 x 3.3 cm with vascularity.   +---------+---------------+---------+-----------+----------+--------------+ LEFT     CompressibilityPhasicitySpontaneityPropertiesThrombus Aging +---------+---------------+---------+-----------+----------+--------------+ CFV      Full           Yes      Yes                                 +---------+---------------+---------+-----------+----------+--------------+ SFJ      Full                                                        +---------+---------------+---------+-----------+----------+--------------+ FV Prox  Full                                                        +---------+---------------+---------+-----------+----------+--------------+  FV Mid   Full                                                        +---------+---------------+---------+-----------+----------+--------------+ FV DistalFull                                                        +---------+---------------+---------+-----------+----------+--------------+ PFV      Full                                                        +---------+---------------+---------+-----------+----------+--------------+ POP      Full           Yes      Yes                                 +---------+---------------+---------+-----------+----------+--------------+ PTV      Full                                                        +---------+---------------+---------+-----------+----------+--------------+ PERO     Full                                                        +---------+---------------+---------+-----------+----------+--------------+     Summary: BILATERAL: - No evidence of deep vein thrombosis seen in the lower extremities, bilaterally. -No evidence of popliteal cyst, bilaterally.   *See table(s) above for measurements and observations. Electronically signed by Carolynn Sayers on 02/23/2023 at 4:54:16 PM.    Final    DG Shoulder Right  Result Date: 02/22/2023 CLINICAL DATA:   Patient unable to move right arm.  Pain. EXAM: RIGHT SHOULDER - 2+ VIEW COMPARISON:  None Available. FINDINGS: There is no acute fracture or dislocation. The bones are osteopenic. Faint density along the inferior bony glenoid, chronic and likely degenerative. There is mild degenerative changes of the right AC joint. The soft tissues are unremarkable. IMPRESSION: 1. No acute fracture or dislocation. 2. Osteopenia and mild degenerative changes. Electronically Signed   By: Elgie Collard M.D.   On: 02/22/2023 18:52   DG Wrist Complete Right  Result Date: 02/22/2023 CLINICAL DATA:  Pain with decreased range of motion. EXAM: RIGHT WRIST - COMPLETE 3+ VIEW COMPARISON:  None Available. FINDINGS: There are degenerative change scratch set there is degenerative changes involving the radiocarpal joint. Mild increase caliber of the scapholunate interval which measures 5 mm. No signs of acute fracture or dislocation. IMPRESSION: 1. No acute findings. 2. Degenerative changes involving the radiocarpal joint. 3. Mild increase caliber of the scapholunate interval which may reflect scapholunate ligament injury. Electronically Signed  By: Signa Kell M.D.   On: 02/22/2023 18:51   DG Chest 2 View  Result Date: 02/22/2023 CLINICAL DATA:  Hypoxia EXAM: CHEST - 2 VIEW COMPARISON:  Chest x-ray 01/12/2023 FINDINGS: There are trace bilateral pleural effusions. There is no focal lung infiltrate or pneumothorax. The cardiomediastinal silhouette is within normal limits. No acute fractures are seen. IMPRESSION: Trace bilateral pleural effusions. Electronically Signed   By: Darliss Cheney M.D.   On: 02/22/2023 18:51   DG Abd 1 View  Result Date: 02/22/2023 CLINICAL DATA:  Bowel incontinence EXAM: ABDOMEN - 1 VIEW COMPARISON:  None Available. FINDINGS: Bowel gas pattern appears nonobstructed. No dilated loops of large or small bowel identified. No abnormal abdominal or pelvic calcifications identified. Previous right hip  arthroplasty. IMPRESSION: Nonobstructive bowel gas pattern. Electronically Signed   By: Signa Kell M.D.   On: 02/22/2023 18:50   Sleep Study Documents  Result Date: 01/30/2023 Ordered by an unspecified provider.    The total time spent in the appointment was 40 minutes encounter with patients including review of chart and various tests results, discussions about plan of care and coordination of care plan   All questions were answered. The patient knows to call the clinic with any problems, questions or concerns. No barriers to learning was detected.  Dawson Bills, NP 12/11/20249:21 AM ADDENDUM: Hematology/Oncology Attending: I had a face-to-face encounter with the patient today.  I agree with the above note.  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  He is currently at the inpatient rehab center since February 15, 2023 after suffering a recent stroke with right-sided weakness and inability to feed himself.  The patient has a significant past medical history of congestive heart failure, coronary artery disease, chronic kidney disease, dementia as well as history of prostate cancer status post surgery in 2018.  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  He was seen by dermatology and biopsy was done and that showed B-cell lymphoma but unfortunately the pathology report is not available for me at this point.  The patient lives in [place] and he was referred to the [facility] in [place] with Dr. Melvyn Neth but has not seen officially seen him yet.  He is currently in the rehab center and his wife was wondering if there is any treatment that he could receive while he is currently in the  hospital.  The patient has significant comorbidities and it is unlikely for him to benefit from any treatment during his hospitalization except maybe for Rituxan but I think he would benefit from full treatment of his condition after discharge and improvement of his general condition by his oncologist in [facility]. We will try to receive additional records from his dermatology regarding his pathology and also we will work with the cancer center in [facility] to facilitate his follow-up visit and treatment after discharge. Thank you for allowing [place] to participate in his care.  Please call if you have any questions. Disclaimer: This note was dictated with voice recognition software. Similar sounding words can inadvertently be transcribed and may be missed upon review.  Lajuana Matte, MD

## 2023-02-28 NOTE — Procedures (Signed)
Modified Barium Swallow Study  Patient Details  Name: Jeffrey Acevedo: [id number] Date of Birth: 03-11-1941  Today's Date: 02/28/2023  Modified Barium Swallow completed.  Full report located under Chart Review in the Imaging Section.  History of Present Illness Jeffrey Acevedo. Jeffrey Acevedo is a [name] with history of CAD, HTN, CKD 3a, chronic diastolic CHF, hearing loss, dementia, who was admitted to [facility] on 02/15/23 with reports of right sided weakness and inability hold fork or to feed himself. He was found to have acute on chronic renal failure with SCr 106,   MRI brain done revealing  acute-subacute left temporal as well as left parietal and right parietal lobes.   Clinical Impression Patient presents with mild oral dysphagia with functional pharyngeal phase. Oral phase is characterized by reduced lingual control/coordination and poor bolus prep/mastication resulting in occasional lingual pumping, oral residuals and posterior spill to pyriform sinuses. Pharyngeal phase is functional for patients age/medical status, with tracely reduced pharyngeal stripping wave, though remarkable for extremely trace transient penetration of thin liquids x1 and vallecular residuals. No other penetration/aspiration observed throughout consistencies. Vallecular residue increased post-prandially due to additional oral residue spillage. Vallecular residuals effectively cleared through second cued dry swallow. Recommend continuation of D3/thin liquid diet with second dry swallow after all consistencies. Patient with consumption of pill in puree during study, though chewed rather than swallowed whole despite instruction. Continue medications crushed in puree with upcoming trials of whole in puree w/ use of compensatory strategies. Patient should continue to follow standardized swallowing precautions including small bites/sips, slow rate, and sitting upright at 90 degrees.   Factors that may increase risk of adverse  event in presence of aspiration Jeffrey Acevedo & Jeffrey Acevedo 2021): Reduced cognitive function  Swallow Evaluation Recommendations Recommendations: PO diet PO Diet Recommendation: Dysphagia 3 (Mechanical soft);Thin liquids (Level 0) Liquid Administration via: Cup;Straw Medication Administration: Crushed with puree Supervision: Full supervision/cueing for swallowing strategies Swallowing strategies  : Minimize environmental distractions;Slow rate;Small bites/sips;Multiple dry swallows after each bite/sip Postural changes: Position pt fully upright for meals Oral care recommendations: Oral care BID (2x/day)   Jeffrey Acevedo M.A., CF-SLP 02/28/2023,9:46 AM

## 2023-02-28 NOTE — Patient Care Conference (Signed)
Inpatient RehabilitationTeam Conference and Plan of Care Update Date: 02/28/2023   Time: 10:17 AM    Patient Name: Jeffrey Acevedo      Medical Record Number: [id number]  Date of Birth: [birth year] Sex: Male         Room/Bed: 4M01C/4M01C-01 Payor Info: Payor: [employer] MEDICARE / Plan: [employer] MEDICARE CHOICE PPO / Product Type: *No Product type* /    Admit Date/Time:  02/22/2023 12:15 PM  Primary Diagnosis:  Stroke (cerebrum) [facility])  Hospital Problems: Principal Problem:   Stroke (cerebrum) [facility])    Expected Discharge Date: Expected Discharge Date: 03/09/23  Team Members Present: Physician leading conference: Dr. Claudette Laws Social Worker Present: Lavera Guise, BSW Nurse Present: Chana Bode, RN PT Present: Ralph Leyden, PT OT Present: Roney Mans, OT SLP Present: Everardo Pacific, SLP PPS Coordinator present : Fae Pippin, SLP     Current Status/Progress Goal Weekly Team Focus  Bowel/Bladder   incontinent bowel/bladder   Patient to verbalize need to go to bathroom   scheduled toileting    Swallow/Nutrition/ Hydration   D3/thin   supervision  MBS this week due to noted increased s/sx of aspiration    ADL's   max A UB self care, total A of 2 LB self care, max A squat pivots with +2 due to posterior and R lean,  new increased RUE pain in wrist and hand.  attention and visual focus limited.   Mod A overall   postural control, balance, attention, visual focus to enable pt to engage in functional mobility needed for self care    Mobility   Bed mobility = MaxA; Transfers = MaxA; Sitting balance = pushes posteriorly (either to R or L); Ambulation/stairs = not attempted for safety;   mod A mobility, min A bed  Barriers = attention to task, R hemi, pusher; Weekly Focus = NMRE, midline orientation, sitting/standing balance, gait initiation.    Communication   80% at phrase   minA   increase intelligibility through increasing vocal intensity     Safety/Cognition/ Behavioral Observations  mod-maxA for awareness, problem solving, memory   modA   continue to address intellectual awareness, encourage use of memory strategies and external aids, orientation to time    Pain   No signs or complaints of pain   Patient will remain free from pain   Patient will be able to vocalize pain and discomfort.    Skin   Patient has no new PI or other skin issues. Mepilex in place on buttocks for protection.   Patient will remain free from skin injuries.  Q2H turns, utilizing protective skin care products, keeping skin dry      Discharge Planning:  Discharging home with DTR, SIL, spouse and grandchildren. 5 steps to enter. Patient has a WC, transport chair and RW.   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 UE mobility better; close to baseline strength.  Continue to work on posture control, pushing and closed chain activity.  Patient on target to meet rehab goals: Currently needs +2 max assist for transfers and max - total assist for self care. Needs mod _ max assist for cognition. Goals for discharge set for mod assist overall.  *See Care Plan and progress notes for long and short-term goals.   Revisions to Treatment Plan:  MBS; D3 thin diet Palliative Care Consult Oncology follow up for treatment options; [facility] Care   Teaching Needs: Safety, medications, dietary modification, transfers, toileting, skin care,  etc.   Current Barriers to Discharge: Decreased caregiver support and Home enviroment access/layout  Possible Resolutions to Barriers: Family education Ramp to entry of home     Medical Summary Current Status: Lymphoma B cell. Multi infarct dementia, RUE weakness, poor sensation, Xrays mild degenerative  Barriers to Discharge: Medical stability;Incontinence;Behavior/Mood   Possible Resolutions to [employer] Focus: oncology eval, may need palliative  consult   Continued Need for Acute Rehabilitation Level of Care: The patient requires daily medical management by a physician with specialized training in physical medicine and rehabilitation for the following reasons: Direction of a multidisciplinary physical rehabilitation program to maximize functional independence : Yes Medical management of patient stability for increased activity during participation in an intensive rehabilitation regime.: Yes Analysis of laboratory values and/or radiology reports with any subsequent need for medication adjustment and/or medical intervention. : Yes   I attest that I was present, lead the team conference, and concur with the assessment and plan of the team.   Chana Bode B 02/28/2023, 3:09 PM

## 2023-02-28 NOTE — Progress Notes (Addendum)
PROGRESS NOTE   Subjective/Complaints: No shoulder pain this am, recognizes me as MD  Xrays reviewed unremarkable At wife's req spoke to Onc who will be evaluating pt today.  Unlikely to start treatment for Lymphoma while in rehab due to availablity of newer agents in inpt setting   ROS- limited by mental status, denies pain  Objective:   DG Wrist Complete Right  Result Date: 02/27/2023 CLINICAL DATA:  Right upper extremity pain. EXAM: RIGHT WRIST - COMPLETE 3+ VIEW; RIGHT FOREARM - 2 VIEW COMPARISON:  None Available. FINDINGS: There is no acute fracture or dislocation. The bones are osteopenic. There is arthritic changes of the wrist with severe narrowing of the radiocarpal distance. The soft tissues are unremarkable. IMPRESSION: 1. No acute fracture or dislocation. 2. 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 Electronically Signed   By: Elgie Collard M.D.   On: 02/27/2023 18:18   DG Forearm Right  Result Date: 02/27/2023 CLINICAL DATA:  Right upper extremity pain. EXAM: RIGHT WRIST - COMPLETE 3+ VIEW; RIGHT FOREARM - 2 VIEW COMPARISON:  None Available. FINDINGS: There is no acute fracture or dislocation. The bones are osteopenic. There is arthritic changes of the wrist with severe narrowing of the radiocarpal distance. The soft tissues are unremarkable. IMPRESSION: 1. No acute fracture or dislocation. 2. 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 Electronically Signed   By: Elgie Collard M.D.   On: 02/27/2023 18:18   Recent Labs    02/26/23 0532  WBC 7.6  HGB 11.9*  HCT 37.8*  PLT 350   Recent Labs    02/26/23 0532  NA 133*  K 3.7  CL 101  CO2 24  GLUCOSE 92  BUN 22  CREATININE 1.21  CALCIUM 9.1    Intake/Output Summary (Last 24 hours) at 02/28/2023 0830 Last data filed at 02/28/2023 0272 Gross per 24 hour  Intake 840 ml  Output 400 ml  Net 440 ml     Pressure Injury 09/19/21 Coccyx Unstageable  - Full thickness tissue loss in which the base of the injury is covered by slough (yellow, tan, gray, green or Dwiggins) and/or eschar (tan, Hartl or black) in the wound bed. oval shaped wound on coccyx with yello (Active)  09/19/21 1450  Location: Coccyx  Location Orientation:   Staging: Unstageable - Full thickness tissue loss in which the base of the injury is covered by slough (yellow, tan, gray, green or Clarin) and/or eschar (tan, Leung or black) in the wound bed.  Wound Description (Comments): oval shaped wound on coccyx with yellow slough  Present on Admission: Yes (Originally listed as stage 1 09/19/21; labeled as unstageable 02/22/23 on admit to CIR)    Physical Exam: Vital Signs Blood pressure 124/82, pulse (!) 53, temperature 97.8 F (36.6 C), resp. rate 18, height 6' (1.829 m), weight 77 kg, SpO2 93%.  General: No acute distress Mood and affect are appropriate Heart: Regular rate and rhythm no rubs murmurs or extra sounds Lungs: Clear to auscultation, breathing unlabored, no rales or wheezes Abdomen: Positive bowel sounds, soft nontender to palpation, nondistended Extremities: No clubbing, cyanosis, or edema Skin: No evidence of breakdown, no evidence of rash  Neurologic: Cranial nerves II through XII intact, motor strength is 5/5 in Left and 4/5 RIght deltoid, bicep, tricep, grip, 5/5 bilateral hip flexor, knee extensors, ankle dorsiflexor and plantar flexor Oriented to person but not time   Musculoskeletal: no pain with AROM in all 4 ext  No joint swelling    Assessment/Plan: 1. Functional deficits which require 3+ hours per day of interdisciplinary therapy in a comprehensive inpatient rehab setting. Physiatrist is providing close team supervision and 24 hour management of active medical problems listed below. Physiatrist and rehab team continue to assess barriers to discharge/monitor patient progress toward functional and medical goals  Care Tool:  Bathing    Body  parts bathed by patient: Chest, Abdomen, Face, Right arm   Body parts bathed by helper: Left arm, Front perineal area, Buttocks, Right upper leg, Left lower leg, Right lower leg, Left upper leg     Bathing assist Assist Level: Maximal Assistance - Patient 24 - 49%     Upper Body Dressing/Undressing Upper body dressing   What is the patient wearing?: Pull over shirt    Upper body assist Assist Level: Moderate Assistance - Patient 50 - 74%    Lower Body Dressing/Undressing Lower body dressing      What is the patient wearing?: Pants     Lower body assist Assist for lower body dressing: Maximal Assistance - Patient 25 - 49%     Toileting Toileting    Toileting assist Assist for toileting: 2 Helpers     Transfers Chair/bed transfer  Transfers assist  Chair/bed transfer activity did not occur: Safety/medical concerns (unsafe to get up)  Chair/bed transfer assist level: 2 Helpers (MAX A +2 squat pivot)     Locomotion Ambulation   Ambulation assist   Ambulation activity did not occur: Safety/medical concerns          Walk 10 feet activity   Assist  Walk 10 feet activity did not occur: Safety/medical concerns        Walk 50 feet activity   Assist Walk 50 feet with 2 turns activity did not occur: Safety/medical concerns         Walk 150 feet activity   Assist Walk 150 feet activity did not occur: Safety/medical concerns         Walk 10 feet on uneven surface  activity   Assist Walk 10 feet on uneven surfaces activity did not occur: Safety/medical concerns         Wheelchair     Assist Is the patient using a wheelchair?: Yes Type of Wheelchair: Manual    Wheelchair assist level: Dependent - Patient 0%      Wheelchair 50 feet with 2 turns activity    Assist        Assist Level: Dependent - Patient 0%   Wheelchair 150 feet activity     Assist      Assist Level: Dependent - Patient 0%   Blood pressure  124/82, pulse (!) 53, temperature 97.8 F (36.6 C), resp. rate 18, height 6' (1.829 m), weight 77 kg, SpO2 93%.  Medical Problem List and Plan: 1. 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  Team conference today please see physician documentation under team conference tab, met with team  to discuss problems,progress, and goals. Formulized individual treatment plan based on medical history, underlying problem and comorbidities.              Admit to CIR 2.  Antithrombotics: -DVT/anticoagulation:  Pharmaceutical: Lovenox             -antiplatelet therapy: DAPT 3. Pain Management: Tylenol prn. Gabapentin 100 mg TID was added on 11/28 4. Mood/Behavior/Sleep:  LCSW to follow for evaluation and support.              -antipsychotic agents: N/A--monitor for delirium/MS changes with recent stroke/change in environment insetting of baseline dementia. 5. Neuropsych/cognition: This patient may be intermittently capable of making decisions on his own behalf.             --family has noticed some disorientation and have been present 24/7 at [facility]. Unable to do that here             --also [facility] and wears bilateral hearing aids. 6. Skin/Wound Care:  Routine pressure relief pressure.   7. Fluids/Electrolytes/Nutrition: Monitor I/O. Check CMET in am             --Monitor I/O. Lasix was d/c yesterday for ???  8. CAD/Chronic diastolic CHF: Followed by Dr. Allyson Sabal. HH diet with strict I/O. Daily weights             --Most recent PCI 12/29/2022 followed by admissions for fluid overload Continue Jardiance , Lopressor , aldactone              --was on Lasix 40 mg BID PTA-->d/c on 12/04 for ??. Monitor for signs of overload  9. Chronic respiratory failure w/nocturnal hypoxia:  Since Sept--sleep study negative for OSA             --currently oxygen dependent with activity and intermittently during hospitalization --Encourage pulmonary hygiene. Will check CXR to rule out overload.              --Oxygen prn during the day and continue at nights.    10. New diagnosis B-cell lymphoma: Developing multiple nodules for past 2 months- plan was for OP f/u with oncology in [place], family requesting consult while at [facility]              --just received bx reports yesterday-->called derm for path report             --question coagulopathy as cause of stroke. Will check dopplers          No lymphadenopathy on CT angio of chest from 01/04/23  11. CKD 3a: Baseline SCr 1.4-1.5 per records             --monitor weekly  12. Hx of anemia of chronic disease:Hgb stable as well as B12            --continue iron supplement    Latest Ref Rng & Units 02/26/2023    5:32 AM 02/23/2023    6:31 AM 01/13/2023    2:33 AM  CBC  WBC 4.0 - 10.5 K/uL 7.6  8.4  6.4   Hemoglobin 13.0 - 17.0 g/dL 69.6  29.5  9.4   Hematocrit 39.0 - 52.0 % 37.8  37.5  29.4   Platelets 150 - 400 K/uL 350  337  198     13. Hx of vitamin B 12 deficiency: Continue high doses of B12   14. Chronic left knee  pain: Monitor for symptoms with increase in activity/Right hemipareis  15. Hx major depressive d/o: continue zoloft.   16. Hx prostate cancer- sounds like could be indolent:Has urgency/nocturia. Toilet every 4 hours. Continue to check PVRs  17. Hx prediabetes: Hgb A 1c- 5.9 few months ago. Monitor fasting BS with serial labs.   18. R shoulder pain- no pain c/o or pain with R shoulder ROM this am , Xray with mild OA        LOS: 6 days A FACE TO FACE EVALUATION WAS PERFORMED  Erick Colace 02/28/2023, 8:30 AM

## 2023-02-28 NOTE — Progress Notes (Signed)
Patient ID: Jeffrey Acevedo, male   DOB: [birth year], 82 y.o.   MRN: [id number]  Sw received notification from the daughter that the family has discussed wanting to move forward with hospice consult.  Daughter requesting [facility]. Sw informed team.

## 2023-02-28 NOTE — Progress Notes (Signed)
Occupational Therapy Session Note  Patient Details  Name: Jeffrey Acevedo MRN: [id number] Date of Birth: October 20, 1940  Today's Date: 02/28/2023 OT Individual Time: 1103-1200 OT Individual Time Calculation (min): 57 min    Short Term Goals: Week 1:  OT Short Term Goal 1 (Week 1): to increase safety to be able to sit on a toilet, pt will maintain balance at EOB with CGA for at least 10 minutes. OT Short Term Goal 2 (Week 1): Pt will demonstrate improved motor planning of LUE to use L hand to help don shirt with mod A. OT Short Term Goal 3 (Week 1): Pt will use R hand to wash L arm with min A. OT Short Term Goal 4 (Week 1): pt will rise to stand in stedy lift with mod A of 1 5x in a row to ensure he can safely use stedy lift for toilet transfers.  Skilled Therapeutic Interventions/Progress Updates:    Pt received supine with no c/o pain, agreeable to OT session. Session focused on ADL transfers and sit <> stands/static standing in the stedy. He came to EOB with mod A. R wrist splint on. He required min A for sitting balance initially but with proper hip positioning and feet firmly on the floor he progressed to (S). He completed sit > stand from EOB in the stedy with facilitation required for R shoulder flexion to place hand on bar, with mod A. He was transferred via stedy into the bathroom and to the [facility] over toilet. He was able to stand from perched stedy seat with min A. He sat on the toilet for several minutes to attempt and void. He was able to stand with min A and complete posterior peri hygiene, x5 repetitions. As he fatigued his R lean/pushing worsened. He required mod-max A to bring his hips to midline and had difficulty orienting to midline/following cueing. He was then transferred to the w/c. RN entered to do wound assessment on his sacrum. He stood again with similar performance. Soon after he reported urge to have another BM. Similar performance on at least 6 more sit <> stands. This time he  was successful in having a BM. He became emotional when asking about his family visiting, emotional support provided. Pt was left sitting up in the w/c with all needs met, chair alarm set, and call bell within reach.    Therapy Documentation Precautions:  Precautions Precautions: Fall, Other (comment) Precaution Comments: strong posterior and pusher Restrictions Weight Bearing Restrictions: No   Therapy/Group: Individual Therapy  Crissie Reese 02/28/2023, 6:12 AM

## 2023-02-28 NOTE — Plan of Care (Signed)
  Problem: RH BOWEL ELIMINATION Goal: RH STG MANAGE BOWEL WITH ASSISTANCE Description: STG Manage Bowel with toileting Assistance. Outcome: Progressing Goal: RH STG MANAGE BOWEL W/MEDICATION W/ASSISTANCE Description: STG Manage Bowel with Medication with mod I Assistance. Outcome: Progressing   Problem: RH KNOWLEDGE DEFICIT Goal: RH STG INCREASE KNOWLEGDE OF HYPERLIPIDEMIA Description: Patient and family will be able to manage HLD with medications and dietary modifications using educational resources independently Outcome: Progressing

## 2023-02-28 NOTE — Progress Notes (Signed)
Physical Therapy Session Note  Patient Details  Name: Jeffrey Acevedo MRN: [id number] Date of Birth: Sep 18, 1940  Today's Date: 02/28/2023 PT Individual Time: 1020-1050 - 2nd; 0808 - 1032 - 1st; ; 1305 - 1410 PT Individual Time Calculation (min): 30 min; 24 min; 65 min    Short Term Goals: Week 1:  PT Short Term Goal 1 (Week 1): Pt will transfer sup <> sit w/ mod A consistently. PT Short Term Goal 2 (Week 1): Pt will transfer sit to stand w/ max A. PT Short Term Goal 3 (Week 1): Pt will maintain seated balance > 5' at EOB. PT Short Term Goal 4 (Week 1): Pt will transfer SPT w/ max A +1. PT Short Term Goal 5 (Week 1): PT to assess gait.  SESSION 1 Skilled Therapeutic Interventions/Progress Updates: Patient supine in bed with nsg providing morning meds and bladder scan (missed minutes charted) on entrance to room. Patient alert and agreeable to PT session.   Patient reported no pain (R UE with no pain and increase in ROM this day vs previous day - care team made aware). Pt presented with increase in alertness and orientation today (x4) vs previous day.  Therapeutic Activity: Bed Mobility: Pt performed bridge in bed in order to donn personal pants. PTA threaded through B LE's up to knees with VC for pt to bridge in order to self donn up to waist with modA required - as well as stabilizing B LE's in order for pt to bridge. Pt performed supine<>sit on EOB with modA. VC required for initiation and to advance B LE's off of bed. Pt sitting EOB with max cues to place L UE on lap as to avoid pushing posteriorly to the R (also performed to work on sitting balance without UE support - pt only able to maintain short time before requiring modA to get back to upright, or pt uses L UE to grab onto bed features).  Patient supine in bed at end of session with brakes locked, bed alarm set, and all needs within reach.  SESSION 2 Skilled Therapeutic Interventions/Progress Updates: Patient supine in bed on  entrance to room. Patient alert and agreeable to PT session.   Patient reported no pain or change in status since previous session.  Therapeutic Activity: Bed Mobility: Pt performed supine<>sit on EOB with modA. VC required for sequence (R LE to bridge hips to L sidelying, and L UE to push into bed to elevate trunk). Transfers: Pt performed sit<>stand transfer to STEDY from EOB with CGA, and VC to anteriorly lean/place L UE on STEDY bar. Pt transported to main gym dependently for time management in [facility] and performed sit to stand from [facility] via 3 musketeer (modA+2).  Pt performed stand pivot transfer from WC to EOB at end of session with maxA + 1, and VC to advance B LE's accordingly with weight shifting (max cues for pt to initiate movement at appropriate times).    Gait Training:  Pt ambulated 8' using 3 musketeer method with maxA. Pt demonstrated the following gait deviations with therapist providing the described cuing and facilitation for improvement:  - slow,shuffle like pattern with severely decreased B step clearance, length and narrow BOS. Pt required maxA from another therapist present to advance R LE after several steps.  - forward flexed posture/downward gaze with VC to extend through hips (cued to look at other therapists in the room for external targeting).  - Pt slow to initiate movement and required max/totalA to weight shift  accordingly to advance contralateral LE through swing. - Pt required seated rest due to B knees presentation of buckling without pt acknowledging of requiring a rest.   Patient supine in bed at end of session with brakes locked, bed alarm set, and all needs within reach.  SESSION 3 Skilled Therapeutic Interventions/Progress Updates: Patient sitting in WC on entrance to room. Patient alert and agreeable to PT session.   Patient reported no pain during PT session.  Therapeutic Activity: Bed Mobility: Pt performed sit to supine at end of session with maxA (pt  reported increase fatigue at end of this session, and required increased assistance and cues to transition to bed). Transfers: Pt performed sit<>stand pivot transfers with maxA (WC<edge of mat at beginning of session, and WC<EOB at end of session) (+2 on second transfer from edge of mat to [facility]). Provided VC anterior scoot (maxA) and VC to push with L UE - and to avoid touching surfaces with L UE as pt will push. Pt also required max external target cues to step to desired place for pivot with totalA to shift weight accordingly. Pt with B knees in some flexion with forward flex posture and PTA providing max/totalA to extend through hips during transfers.   Neuromuscular Re-ed: NMR facilitated during session with focus on dynamic sitting balance, attention to task, 1-2 step commands. - Pt originally started (sitting edge of mat) with leaning to the L and R, but was presenting with difficulty in achieving desired output (laterally leaning while maintaining attention to task). Pt moved to Pt sitting edge of mat with rehab tech on physioball placed behind pt as tactile cue to extend for upright posture during intervention (max cues to do so). Pt instructed to anteriorly reach to pins on basketball net to work on trunk control. Pt performed 2 rounds with max cues to initiate movement, but output of desired motions was achieved with pt increasing upright posture with neutral pelvis when pushing against physioball every time a pin was obtained. Pt to keep pin in hand until back against ball as to avoid using L UE to push on mat.   NMR performed for improvements in motor control and coordination, balance, sequencing, judgement, and self confidence/ efficacy in performing all aspects of mobility at highest level of independence.   Patient supine in bed at end of session with brakes locked, bed alarm set, and all needs within reach.      Therapy Documentation Precautions:  Precautions Precautions: Fall, Other  (comment) Precaution Comments: strong posterior and pusher Restrictions Weight Bearing Restrictions: No  Therapy/Group: Individual Therapy  [place] PTA 02/28/2023, 3:22 PM

## 2023-02-28 NOTE — Progress Notes (Addendum)
Patient ID: BURLEN MACARI, male   DOB: 08/28/1940, 82 y.o.   MRN: [id number]  Team Conference Report to Patient/Family  Team Conference discussion was reviewed with the patient and caregiver, including goals, any changes in plan of care and target discharge date.  Patient and caregiver express understanding and are in agreement.  The patient has a target discharge date of 03/09/23.  SW met with patient and daughter, Stanton Kidney in patient's room to discuss team conference updates. SW discussed goals with daughter. Currently daughter and pt spouse have different ideas of the goal. Daughter plans to discuss the plan with her mother and come to a determination if focus is on treatment or CIR.   From discussion, SW will discuss the plan for d/c with team and family. Daughter expressed care at home may be an issue due to patients  level of care. Daughter expressed that her sister and brother in law work during the day where patient stays. Pt spouse is only able to provide supervision. Daughter expressed that they currently have a caregiver but she is unsure with the caregiver will be able to mange patient independently.    Once family has had discussion SW will arrange family education for next week with patient daughter and potentially other family members and caregiver.    Daughter aware that patient will require a ramp at home. Daughter also inquiring about SNF SW will provide SNF list for [facility] once family has had discussion.   No additional questions or concerns. Andria Rhein 02/28/2023, 1:23 PM

## 2023-03-01 DIAGNOSIS — I639 Cerebral infarction, unspecified: Secondary | ICD-10-CM | POA: Diagnosis not present

## 2023-03-01 DIAGNOSIS — C8338 Diffuse large B-cell lymphoma, lymph nodes of multiple sites: Secondary | ICD-10-CM | POA: Diagnosis not present

## 2023-03-01 DIAGNOSIS — F01B4 Vascular dementia, moderate, with anxiety: Secondary | ICD-10-CM | POA: Diagnosis not present

## 2023-03-01 DIAGNOSIS — I509 Heart failure, unspecified: Secondary | ICD-10-CM | POA: Diagnosis not present

## 2023-03-01 LAB — CBC
HCT: 38.4 % — ABNORMAL LOW (ref 39.0–52.0)
Hemoglobin: 12.2 g/dL — ABNORMAL LOW (ref 13.0–17.0)
MCH: 27.3 pg (ref 26.0–34.0)
MCHC: 31.8 g/dL (ref 30.0–36.0)
MCV: 85.9 fL (ref 80.0–100.0)
Platelets: 351 10*3/uL (ref 150–400)
RBC: 4.47 MIL/uL (ref 4.22–5.81)
RDW: 16.2 % — ABNORMAL HIGH (ref 11.5–15.5)
WBC: 7.4 10*3/uL (ref 4.0–10.5)
nRBC: 0 % (ref 0.0–0.2)

## 2023-03-01 LAB — BASIC METABOLIC PANEL
Anion gap: 7 (ref 5–15)
BUN: 23 mg/dL (ref 8–23)
CO2: 26 mmol/L (ref 22–32)
Calcium: 9.6 mg/dL (ref 8.9–10.3)
Chloride: 104 mmol/L (ref 98–111)
Creatinine, Ser: 1.34 mg/dL — ABNORMAL HIGH (ref 0.61–1.24)
GFR, Estimated: 53 mL/min — ABNORMAL LOW (ref >60)
Glucose, Bld: 87 mg/dL (ref 70–99)
Potassium: 4 mmol/L (ref 3.5–5.1)
Sodium: 137 mmol/L (ref 135–145)

## 2023-03-01 NOTE — Progress Notes (Signed)
PROGRESS NOTE   Subjective/Complaints:  No c/os.  Appreciated oncology consult. Pt remembers conversation from yesterday about nodules on skin- told they may be cancer  ROS- limited by mental status, denies pain  Objective:   DG Swallowing Func-Speech Pathology Result Date: 02/28/2023 Table formatting from the original result was not included. Modified Barium Swallow Study Patient Details Name: Jeffrey Acevedo MRN: [id number] Date of Birth: 1940/08/20 Today's Date: 02/28/2023 HPI/PMH: HPI: Jeffrey Acevedo is a [name] with history of CAD, HTN, CKD 3a, chronic diastolic CHF, hearing loss, dementia, who was admitted to [facility] on 02/15/23 with reports of right sided weakness and inability hold fork or to feed himself. He was found to have acute on chronic renal failure with SCr 106,   MRI brain done revealing  acute-subacute left temporal as well as left parietal and right parietal lobes. Clinical Impression: Patient presents with mild oral dysphagia with functional pharyngeal phase. Oral phase is characterized by reduced lingual control/coordination and poor bolus prep/mastication resulting in occasional lingual pumping, oral residuals and posterior spill to pyriform sinuses. Pharyngeal phase is functional for patients age/medical status, with tracely reduced pharyngeal stripping wave, though remarkable for extremely trace transient penetration of thin liquids x1 and vallecular residuals. No other penetration/aspiration observed throughout consistencies. Vallecular residue increased post-prandially due to additional oral residue spillage. Vallecular residuals effectively cleared through second cued dry swallow. Recommend continuation of D3/thin liquid diet with second dry swallow after all consistencies. Patient with consumption of pill in puree during study, though chewed rather than swallowed whole despite instruction. Continue medications  crushed in puree with upcoming trials of whole in puree w/ use of compensatory strategies. Patient should continue to follow standardized swallowing precautions including small bites/sips, slow rate, and sitting upright at 90 degrees. Factors that may increase risk of adverse event in presence of aspiration [name] & [name] 2021): Factors that may increase risk of adverse event in presence of aspiration [name] & [name] 2021): Reduced cognitive function Recommendations/Plan: Swallowing Evaluation Recommendations Swallowing Evaluation Recommendations Recommendations: PO diet PO Diet Recommendation: Dysphagia 3 (Mechanical soft); Thin liquids (Level 0) Liquid Administration via: Cup; Straw Medication Administration: Crushed with puree Supervision: Full supervision/cueing for swallowing strategies Swallowing strategies  : Minimize environmental distractions; Slow rate; Small bites/sips; Multiple dry swallows after each bite/sip Postural changes: Position pt fully upright for meals Oral care recommendations: Oral care BID (2x/day) Treatment Plan Treatment Plan Treatment recommendations: Therapy as outlined in treatment plan below Follow-up recommendations: Home health SLP Recommendations Comment: n/a Functional status assessment: Patient has had a recent decline in their functional status and/or demonstrates limited ability to make significant improvements in function in a reasonable and predictable amount of time. Treatment frequency: Min 2x/week Treatment duration: 3 weeks Interventions: Aspiration precaution training; Compensatory techniques; Patient/family education; Diet toleration management by SLP Recommendations Recommendations for follow up therapy are one component of a multi-disciplinary discharge planning process, led by the attending physician.  Recommendations may be updated based on patient status, additional functional criteria and insurance authorization. Assessment: Orofacial Exam: Orofacial Exam  Oral Cavity: Oral Hygiene: WFL Oral Cavity - Dentition: Adequate natural dentition Orofacial Anatomy: WFL Anatomy: Anatomy: WFL Boluses  Administered: Boluses Administered Boluses Administered: Thin liquids (Level 0); Mildly thick liquids (Level 2, nectar thick); Puree; Solid  Oral Impairment Domain: Oral Impairment Domain Lip Closure: No labial escape Tongue control during bolus hold: Posterior escape of less than half of bolus Bolus preparation/mastication: Disorganized chewing/mashing with solid pieces of bolus unchewed Bolus transport/lingual motion: Repetitive/disorganized tongue motion Oral residue: Residue collection on oral structures Location of oral residue : Palate; Tongue Initiation of pharyngeal swallow : Pyriform sinuses  Pharyngeal Impairment Domain: Pharyngeal Impairment Domain Soft palate elevation: No bolus between soft palate (SP)/pharyngeal wall (PW) Laryngeal elevation: Complete superior movement of thyroid cartilage with complete approximation of arytenoids to epiglottic petiole Anterior hyoid excursion: Complete anterior movement Epiglottic movement: Complete inversion Laryngeal vestibule closure: Complete, no air/contrast in laryngeal vestibule Pharyngeal stripping wave : Present - diminished Pharyngeal contraction (A/P view only): N/A Pharyngoesophageal segment opening: Complete distension and complete duration, no obstruction of flow Tongue base retraction: Trace column of contrast or air between tongue base and PPW Pharyngeal residue: Collection of residue within or on pharyngeal structures Location of pharyngeal residue: Valleculae  Esophageal Impairment Domain: Esophageal Impairment Domain Esophageal clearance upright position: Complete clearance, esophageal coating Pill: Pill Consistency administered: Puree Puree: WFL Penetration/Aspiration Scale Score: Penetration/Aspiration Scale Score 1.  Material does not enter airway: Mildly thick liquids (Level 2, nectar thick); Puree; Solid; Pill  2.  Material enters airway, remains ABOVE vocal cords then ejected out: Thin liquids (Level 0) Compensatory Strategies: Compensatory Strategies Compensatory strategies: Yes Multiple swallows: Effective Effective Multiple Swallows: Thin liquid (Level 0); Moderately thick liquid (Level 3, honey thick); Puree; Solid; Pill   General Information: No data recorded Diet Prior to this Study: Thin liquids (Level 0); Dysphagia 3 (mechanical soft)   No data recorded  Respiratory Status: WFL   Supplemental O2: None (Room air)   No data recorded Behavior/Cognition: Cooperative; Confused; Requires cueing Self-Feeding Abilities: Needs assist with self-feeding; Dependent for feeding (depends on physical need at the time) Baseline vocal quality/speech: Hypophonia/low volume; Abnormal resonance Volitional Cough: Able to elicit Volitional Swallow: Able to elicit Exam Limitations: No limitations Goal Planning: Prognosis for improved oropharyngeal function: Fair Barriers to Reach Goals: Cognitive deficits Barriers/Prognosis Comment: dementia Patient/Family Stated Goal: n/a Consulted and agree with results and recommendations: Patient Pain: No data recorded End of Session: Start Time:No data recorded Stop Time: No data recorded Time Calculation:No data recorded Charges: No data recorded SLP visit diagnosis: SLP Visit Diagnosis: Dysphagia, oropharyngeal phase (R13.12) Past Medical History: Past Medical History: Diagnosis Date  Acute on chronic diastolic CHF (congestive heart failure) (HCC) 01/03/2023  Cancer (HCC)   prostate cancer surgery 2011  Chronic diastolic heart failure (HCC)   Coronary artery disease   sees Dr. Erlene Quan 3 mths ago.  Stress test 03/31/2015 in epic  Degenerative joint disease   Right hip  Depression   Hearing loss of both ears   only at present wears the right ear hearing aid  Hyperlipidemia   Hypertension   Prostate cancer [facility])  Past Surgical History: Past Surgical History: Procedure Laterality Date  CARDIAC  CATHETERIZATION  06/17/2009  Proximal LAD 90% stenosis just beyond previously stented segment with a Multi-Link Vision bare-metal stent  CORONARY ANGIOPLASTY    stents placed in 1998 and 2011   CORONARY PRESSURE/FFR STUDY N/A 05/02/2021  Procedure: INTRAVASCULAR PRESSURE WIRE/FFR STUDY;  Surgeon: Runell Gess, MD;  Location: [facility] INVASIVE CV LAB;  Service: Cardiovascular;  Laterality: N/A;  CORONARY PRESSURE/FFR STUDY N/A 09/19/2021  Procedure: INTRAVASCULAR PRESSURE WIRE/FFR  STUDY;  Surgeon: Runell Gess, MD;  Location: [facility] INVASIVE CV LAB;  Service: Cardiovascular;  Laterality: N/A;  RAMUS  CORONARY STENT INTERVENTION N/A 05/02/2021  Procedure: CORONARY STENT INTERVENTION;  Surgeon: Runell Gess, MD;  Location: [facility] INVASIVE CV LAB;  Service: Cardiovascular;  Laterality: N/A;  CORONARY STENT INTERVENTION N/A 09/19/2021  Procedure: CORONARY STENT INTERVENTION;  Surgeon: Runell Gess, MD;  Location: [facility] INVASIVE CV LAB;  Service: Cardiovascular;  Laterality: N/A;  CORONARY STENT INTERVENTION N/A 12/29/2022  Procedure: CORONARY STENT INTERVENTION;  Surgeon: Kathleene Hazel, MD;  Location: [facility] INVASIVE CV LAB;  Service: Cardiovascular;  Laterality: N/A;  LEFT HEART CATH AND CORONARY ANGIOGRAPHY N/A 05/02/2021  Procedure: LEFT HEART CATH AND CORONARY ANGIOGRAPHY;  Surgeon: Runell Gess, MD;  Location: [facility] INVASIVE CV LAB;  Service: Cardiovascular;  Laterality: N/A;  LEFT HEART CATH AND CORONARY ANGIOGRAPHY N/A 09/19/2021  Procedure: LEFT HEART CATH AND CORONARY ANGIOGRAPHY;  Surgeon: Runell Gess, MD;  Location: [facility] INVASIVE CV LAB;  Service: Cardiovascular;  Laterality: N/A;  NASAL SEPTUM SURGERY    Dr. Richardson Landry @ [facility]  2014  [facility]  2011  RIGHT/LEFT HEART CATH AND CORONARY ANGIOGRAPHY N/A 12/29/2022  Procedure: RIGHT/LEFT HEART CATH AND CORONARY ANGIOGRAPHY;  Surgeon: Kathleene Hazel, MD;  Location: [facility] INVASIVE CV LAB;  Service: Cardiovascular;  Laterality: N/A;  ROBOT  ASSISTED LAPAROSCOPIC RADICAL PROSTATECTOMY    TOTAL HIP ARTHROPLASTY Right 04/19/2015  Procedure: RIGHT TOTAL HIP ARTHROPLASTY ANTERIOR APPROACH;  Surgeon: Jodi Geralds, MD;  Location: [facility] OR;  Service: Orthopedics;  Laterality: Right; Cassidi F Sockwell 02/28/2023, 3:36 PM  DG Wrist Complete Right Result Date: 02/27/2023 CLINICAL DATA:  Right upper extremity pain. EXAM: RIGHT WRIST - COMPLETE 3+ VIEW; RIGHT FOREARM - 2 VIEW COMPARISON:  None Available. FINDINGS: There is no acute fracture or dislocation. The bones are osteopenic. There is arthritic changes of the wrist with severe narrowing of the radiocarpal distance. The soft tissues are unremarkable. IMPRESSION: 1. No acute fracture or dislocation. 2. 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 Electronically Signed   By: Elgie Collard M.D.   On: 02/27/2023 18:18   DG Forearm Right Result Date: 02/27/2023 CLINICAL DATA:  Right upper extremity pain. EXAM: RIGHT WRIST - COMPLETE 3+ VIEW; RIGHT FOREARM - 2 VIEW COMPARISON:  None Available. FINDINGS: There is no acute fracture or dislocation. The bones are osteopenic. There is arthritic changes of the wrist with severe narrowing of the radiocarpal distance. The soft tissues are unremarkable. IMPRESSION: 1. No acute fracture or dislocation. 2. 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 Electronically Signed   By: Elgie Collard M.D.   On: 02/27/2023 18:18   Recent Labs    03/01/23 0509  WBC 7.4  HGB 12.2*  HCT 38.4*  PLT 351   Recent Labs    03/01/23 0509  NA 137  K 4.0  CL 104  CO2 26  GLUCOSE 87  BUN 23  CREATININE 1.34*  CALCIUM 9.6    Intake/Output Summary (Last 24 hours) at 03/01/2023 1610 Last data filed at 02/28/2023 2136 Gross per 24 hour  Intake 330 ml  Output --  Net 330 ml     Pressure Injury 09/19/21 Coccyx Unstageable - Full thickness tissue loss in which the base of the injury is covered by slough (yellow, tan, gray, green or Nuss) and/or eschar (tan,  Degraffenreid or black) in the wound bed. oval shaped wound on coccyx with yello (Active)  09/19/21 1450  Location: Coccyx  Location Orientation:   Staging: Unstageable - Full thickness tissue loss in which the base of the injury is covered by slough (yellow, tan, gray, green or Dossantos) and/or eschar (tan, Rybolt or black) in the wound bed.  Wound Description (Comments): oval shaped wound on coccyx with yellow slough  Present on Admission: Yes (Originally listed as stage 1 09/19/21; labeled as unstageable 02/22/23 on admit to CIR)    Physical Exam: Vital Signs Blood pressure 135/78, pulse (!) 55, temperature 98 F (36.7 C), temperature source Oral, resp. rate 17, height 6' (1.829 m), weight 77 kg, SpO2 96%.  General: No acute distress Mood and affect are appropriate Heart: Regular rate and rhythm no rubs murmurs or extra sounds Lungs: Clear to auscultation, breathing unlabored, no rales or wheezes Abdomen: Positive bowel sounds, soft nontender to palpation, nondistended Extremities: No clubbing, cyanosis, or edema Skin: No evidence of breakdown, no evidence of rash   Neurologic: Cranial nerves II through XII intact, motor strength is 5/5 in Left and 4/5 RIght deltoid, bicep, tricep, grip, 5/5 bilateral hip flexor, knee extensors, ankle dorsiflexor and plantar flexor Oriented to person but not time   Musculoskeletal: no pain with AROM in all 4 ext  No joint swelling    Assessment/Plan: 1. Functional deficits which require 3+ hours per day of interdisciplinary therapy in a comprehensive inpatient rehab setting. Physiatrist is providing close team supervision and 24 hour management of active medical problems listed below. Physiatrist and rehab team continue to assess barriers to discharge/monitor patient progress toward functional and medical goals  Care Tool:  Bathing    Body parts bathed by patient: Chest, Abdomen, Face, Right arm   Body parts bathed by helper: Left arm, Front perineal  area, Buttocks, Right upper leg, Left lower leg, Right lower leg, Left upper leg     Bathing assist Assist Level: Maximal Assistance - Patient 24 - 49%     Upper Body Dressing/Undressing Upper body dressing   What is the patient wearing?: Pull over shirt    Upper body assist Assist Level: Moderate Assistance - Patient 50 - 74%    Lower Body Dressing/Undressing Lower body dressing      What is the patient wearing?: Pants     Lower body assist Assist for lower body dressing: Maximal Assistance - Patient 25 - 49%     Toileting Toileting    Toileting assist Assist for toileting: 2 Helpers     Transfers Chair/bed transfer  Transfers assist  Chair/bed transfer activity did not occur: Safety/medical concerns (unsafe to get up)  Chair/bed transfer assist level: 2 Helpers (MAX A +2 squat pivot)     Locomotion Ambulation   Ambulation assist   Ambulation activity did not occur: Safety/medical concerns          Walk 10 feet activity   Assist  Walk 10 feet activity did not occur: Safety/medical concerns        Walk 50 feet activity   Assist Walk 50 feet with 2 turns activity did not occur: Safety/medical concerns         Walk 150 feet activity   Assist Walk 150 feet activity did not occur: Safety/medical concerns         Walk 10 feet on uneven surface  activity   Assist Walk 10 feet on uneven surfaces activity did not occur: Safety/medical concerns         Wheelchair     Assist Is the patient using a wheelchair?: Yes  Type of Wheelchair: Manual    Wheelchair assist level: Dependent - Patient 0%      Wheelchair 50 feet with 2 turns activity    Assist        Assist Level: Dependent - Patient 0%   Wheelchair 150 feet activity     Assist      Assist Level: Dependent - Patient 0%   Blood pressure 135/78, pulse (!) 55, temperature 98 F (36.7 C), temperature source Oral, resp. rate 17, height 6' (1.829 m), weight  77 kg, SpO2 96%.  Medical Problem List and Plan: 1. 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  Antithrombotics: -DVT/anticoagulation:  Pharmaceutical: Lovenox             -antiplatelet therapy: DAPT 3. Pain Management: Tylenol prn. Gabapentin 100 mg TID was added on 11/28 4. Mood/Behavior/Sleep:  LCSW to follow for evaluation and support.              -antipsychotic agents: N/A--monitor for delirium/MS changes with recent stroke/change in environment insetting of baseline dementia. 5. Neuropsych/cognition: This patient may be intermittently capable of making decisions on his own behalf.             --family has noticed some disorientation and have been present 24/7 at [facility]. Unable to do that here             --also [facility] and wears bilateral hearing aids. 6. Skin/Wound Care:  Routine pressure relief pressure.   7. Fluids/Electrolytes/Nutrition: Monitor I/O. Check CMET in am             --Monitor I/O. Lasix was d/c yesterday for ???  8. CAD/Chronic diastolic CHF: Followed by Dr. Allyson Sabal. HH diet with strict I/O. Daily weights             --Most recent PCI 12/29/2022 followed by admissions for fluid overload Continue Jardiance , Lopressor , aldactone              --was on Lasix 40 mg BID PTA-->d/c on 12/04 for ??. Monitor for signs of overload  9. Chronic respiratory failure w/nocturnal hypoxia: Since Sept--sleep study negative for OSA             --currently oxygen dependent with activity and intermittently during hospitalization --Encourage pulmonary hygiene. Will check CXR to rule out overload.              --Oxygen prn during the day and continue at nights.    10. New diagnosis B-cell lymphoma: Developing multiple nodules for past 2 months- plan was for OP f/u with oncology in [place],[facility]  to set up f/u with  Dr Melvyn Neth in [place]              --just received bx reports yesterday-->called derm for path report             --question coagulopathy as cause of stroke. Will check dopplers          No lymphadenopathy on CT angio of chest from 01/04/23  11. CKD 3a: Baseline SCr 1.4-1.5 per records             --monitor  weekly  12. Hx of anemia of chronic disease:Hgb stable as well as B12            --continue iron supplement    Latest Ref Rng & Units 03/01/2023    5:09 AM 02/26/2023    5:32 AM 02/23/2023    6:31 AM  CBC  WBC 4.0 - 10.5 K/uL 7.4  7.6  8.4   Hemoglobin 13.0 - 17.0 g/dL 91.4  78.2  95.6   Hematocrit 39.0 - 52.0 % 38.4  37.8  37.5   Platelets 150 - 400 K/uL 351  350  337     13. Hx of vitamin B 12 deficiency: Continue high doses of B12   14. Chronic left knee pain: Monitor for symptoms with increase in activity/Right hemipareis  15. Hx major depressive d/o: continue zoloft.   16. Hx prostate cancer- sounds like could be indolent:Has urgency/nocturia. Toilet every 4 hours. Continue to check PVRs  17. Hx prediabetes: Hgb A 1c- 5.9 few months ago. Monitor fasting BS with serial labs.   18. R shoulder pain- no pain c/o or pain with R shoulder ROM this am , Xray with mild OA        LOS: 7 days A FACE TO FACE EVALUATION WAS PERFORMED  Erick Colace 03/01/2023, 7:52 AM

## 2023-03-01 NOTE — Progress Notes (Signed)
Patient ID: Jeffrey Acevedo, male   DOB: 09-01-1940, 82 y.o.   MRN: [id number]  No DME or FU recommendations currently.

## 2023-03-01 NOTE — Progress Notes (Signed)
Physical Therapy Session Note  Patient Details  Name: Jeffrey Acevedo MRN: [id number] Date of Birth: 19-Jul-1940  Today's Date: 03/01/2023 PT Individual Time: 1914-7829 PT Individual Time Calculation (min): 30 min   Short Term Goals: Week 1:  PT Short Term Goal 1 (Week 1): Pt will transfer sup <> sit w/ mod A consistently. PT Short Term Goal 2 (Week 1): Pt will transfer sit to stand w/ max A. PT Short Term Goal 3 (Week 1): Pt will maintain seated balance > 5' at EOB. PT Short Term Goal 4 (Week 1): Pt will transfer SPT w/ max A +1. PT Short Term Goal 5 (Week 1): PT to assess gait.  Skilled Therapeutic Interventions/Progress Updates:    Chart reviewed and pt agreeable to therapy. Pt received seated in WC with no c/o pain. Also of note, family present t/o session. Session focused on functional transfers to promote safe home access. Pt initiated session with sit to stand practice using MaxA + 2 + STEDY. Pt noted to have decreased strength and forward trunk flexion for power up. Pt completed blocked practice of L leans and compensation strategy to decrease R lateral lean. Family also trained and demonstrated ability to provide VC to patient for vertical realignment. Pt then completed transfer into bed using MaxA + 2 Family educated on safe support for bed mobility including boosting and skin protection. At end of session, pt was left semi-reclined in bed with nurse call bell and all needs in reach. RN notified that bed alarm not engaging correctly, and family in agreement to call for help if patient needs support to leave bed. Tele-sitter also in room.     Therapy Documentation Precautions:  Precautions Precautions: Fall, Other (comment) Precaution Comments: strong posterior and pusher Restrictions Weight Bearing Restrictions Per Provider Order: No General: PT Amount of Missed Time (min): 10 Minutes PT Missed Treatment Reason: Other (Comment) (nsg providing medication)     Therapy/Group:  Individual Therapy  Dionne Milo, PT, DPT 03/01/2023, 3:32 PM

## 2023-03-01 NOTE — Progress Notes (Signed)
Patient ID: Jeffrey Acevedo, male   DOB: 1940-05-26, 82 y.o.   MRN: [id number]  Family education arranged with daughters, son in law and caregiver 12/16, 12/17 and 12/19 1-4 PM.

## 2023-03-01 NOTE — Progress Notes (Signed)
Occupational Therapy Session Note  Patient Details  Name: Jeffrey Acevedo MRN: [id number] Date of Birth: May 24, 1940  Today's Date: 03/01/2023 OT Individual Time: 0830-0920 OT Individual Time Calculation (min): 50 min    Short Term Goals: Week 1:  OT Short Term Goal 1 (Week 1): to increase safety to be able to sit on a toilet, pt will maintain balance at EOB with CGA for at least 10 minutes. OT Short Term Goal 2 (Week 1): Pt will demonstrate improved motor planning of LUE to use L hand to help don shirt with mod A. OT Short Term Goal 3 (Week 1): Pt will use R hand to wash L arm with min A. OT Short Term Goal 4 (Week 1): pt will rise to stand in stedy lift with mod A of 1 5x in a row to ensure he can safely use stedy lift for toilet transfers.  Skilled Therapeutic Interventions/Progress Updates:    Pt seen this session with rehab tech for +2 A.  Pt received in bed and agreeable to getting up to bathe and dress. Pt sat to EOB with mod A of one demonstrating more alertness and engagement.  Once at EOB, able to sit with only CGA.  Pt completed squat pivot transfer to R to wc with heavy max A of 2 as he was pushing back with his torso despite tactile and verbal cues to lean forward.  Once in wc ,pt positioned at sink for bathing and dressing. Removed his wrist splint for bathing and pt was not able to tolerate as he expressed significant pain when his wrist was flexed and touched.  Replaced splint. See ADL documentation below.  Emphasis on pt sitting upright and rising to stand as his clothing was being changed over hips.  He rises to stand with only mod A but continues to need heavy max A to hold stand due to posterior and R lean.  Pt positioned in w/c with belt alarm belt and lap tray on.  Call light in reach.    Therapy Documentation Precautions:  Precautions Precautions: Fall, Other (comment) Precaution Comments: strong posterior and pusher Restrictions Weight Bearing Restrictions Per  Provider Order: No      Pain: Pain Assessment Faces Pain Scale: Hurts little more Pain Type: Acute pain Pain Location: Wrist Pain Orientation: Right Pain Descriptors / Indicators: Aching Pain Onset: With Activity Pain Intervention(s): Repositioned;Splinting ADL: ADL Eating: Supervision/safety Grooming: Minimal assistance Upper Body Bathing: Moderate assistance Where Assessed-Upper Body Bathing: Sitting at sink Lower Body Bathing: Maximal assistance Where Assessed-Lower Body Bathing: Sitting at sink Upper Body Dressing: Maximal assistance Where Assessed-Upper Body Dressing: Sitting at sink Lower Body Dressing: Dependent Where Assessed-Lower Body Dressing: Other (Comment) (with use of a stedy lift and 2 persons) Toileting: Dependent Where Assessed-Toileting: Bed level Toilet Transfer: Unable to assess (attempted transfer but not safe due to leaning) Film/video editor Method: Unable to assess   Therapy/Group: Individual Therapy  Keayra Graham 03/01/2023, 12:13 PM

## 2023-03-01 NOTE — Progress Notes (Signed)
Physical Therapy Session Note  Patient Details  Name: Jeffrey Acevedo MRN: [id number] Date of Birth: [birth year]  Today's Date: 03/01/2023 PT Individual Time: 1015-1120 PT Individual Time Calculation (min): 65 min   Short Term Goals: Week 1:  PT Short Term Goal 1 (Week 1): Pt will transfer sup <> sit w/ mod A consistently. PT Short Term Goal 2 (Week 1): Pt will transfer sit to stand w/ max A. PT Short Term Goal 3 (Week 1): Pt will maintain seated balance > 5' at EOB. PT Short Term Goal 4 (Week 1): Pt will transfer SPT w/ max A +1. PT Short Term Goal 5 (Week 1): PT to assess gait.  Skilled Therapeutic Interventions/Progress Updates: Patient sitting in [facility] with nsg providing medication on entrance to room. Patient alert and agreeable to PT session.   Patient reported that enlarged nodule on R upper trunk (on top of clavicle). Attending nsg notified. Pt stated it is the first time any of the enlarged nodules have presented with pain.   Therapeutic Activity:  - Pt required use of toilet with urgency at beginning of session. Pt transported to toilet in room via STEDY with modA. Pt required max cues to center posture due to L UE pushing into STEDY bar. Pt did not want to let go of bar. Pt with controlled descent with minA to toilet. Pt did not void (some soil in brief). PTA donned new brief dependently. Pt standing in STEDY with heavy push to R. Pt then sat to [facility] with minA and transported to room dependently.   Neuromuscular Re-ed: - Pt with static standing from WC with L UE around PTA shoulders to avoid pt pushing on arm of WC (pt originally would not want to release grasp). Pt with mirror feedback and required max multimodal cues to fully extend B knees (kept in slight flexion). Pt performed once but unable to maintain. Pt then with yellow theraband around back of knee to act as tactile cue. PTA on L with pt arm around shoulder. Rehab tech in front pulling knee into flexion. Pt continued to  require max multimodal cues to extend knees while looking at mirror to maintain upright standing posture. Pt required max/heavy maxA.  - LAQ while in WC with 4lb ankle weights donned to B LE's. Pt also cued to count to ten while performing reps to add dual task challenge (pt required max cues to count and was unable to follow cue).   NMR performed for improvements in motor control and coordination, balance, sequencing, judgement, and self confidence/ efficacy in performing all aspects of mobility at highest level of independence.   Patient sitting in WC at end of session with brakes locked, belt alarm set, and all needs within reach.      Therapy Documentation Precautions:  Precautions Precautions: Fall, Other (comment) Precaution Comments: strong posterior and pusher Restrictions Weight Bearing Restrictions Per Provider Order: No   Therapy/Group: Individual Therapy  Mirella Gueye PTA 03/01/2023, 12:47 PM

## 2023-03-02 DIAGNOSIS — C8338 Diffuse large B-cell lymphoma, lymph nodes of multiple sites: Secondary | ICD-10-CM | POA: Diagnosis not present

## 2023-03-02 DIAGNOSIS — F01B4 Vascular dementia, moderate, with anxiety: Secondary | ICD-10-CM | POA: Diagnosis not present

## 2023-03-02 DIAGNOSIS — I509 Heart failure, unspecified: Secondary | ICD-10-CM | POA: Diagnosis not present

## 2023-03-02 DIAGNOSIS — I639 Cerebral infarction, unspecified: Secondary | ICD-10-CM | POA: Diagnosis not present

## 2023-03-02 MED ORDER — DICLOFENAC SODIUM 1 % EX GEL
2.0000 g | Freq: Four times a day (QID) | CUTANEOUS | Status: DC
  Administered 2023-03-02 – 2023-03-09 (×27): 2 g via TOPICAL
  Filled 2023-03-02: qty 100

## 2023-03-02 NOTE — Progress Notes (Signed)
Physical Therapy Session Note  Patient Details  Name: Jeffrey Acevedo MRN: [id number] Date of Birth: 02-Jul-1940  Today's Date: 03/02/2023 PT Individual Time: 8657-8469 PT Individual Time Calculation (min): 81 min   Short Term Goals: Week 1:  PT Short Term Goal 1 (Week 1): Pt will transfer sup <> sit w/ mod A consistently. PT Short Term Goal 1 - Progress (Week 1): Met PT Short Term Goal 2 (Week 1): Pt will transfer sit to stand w/ max A. PT Short Term Goal 2 - Progress (Week 1): Partly met PT Short Term Goal 3 (Week 1): Pt will maintain seated balance > 5' at EOB. PT Short Term Goal 3 - Progress (Week 1): Not met PT Short Term Goal 4 (Week 1): Pt will transfer SPT w/ max A +1. PT Short Term Goal 4 - Progress (Week 1): Not met PT Short Term Goal 5 (Week 1): PT to assess gait. PT Short Term Goal 5 - Progress (Week 1): Met Week 2:     Skilled Therapeutic Interventions/Progress Updates:  Patient supine in bed on entrance to room. Patient alert and agreeable to PT session.   Patient with no pain complaint at start of session.  Therapeutic Activity: Bed Mobility: Pt performed supine > sit with increased need for cues and Mod A overall for sidelying to sit technique. VC/ tc required for pushing trunk up to seated position on EOB. Pt requires MaxA to maintain sitting position d/t extreme posterior lean. Despite vc/ tc for forward lean, pt continues to push backwards, even with differing methods of facilitation. Guided in repeated performance once achieved in order to improve ability to continue to perform during session. Minimal carryover noted.  Transfers: Pt performed sit<>stand transfers using hallway HR to L side and requiring Mod/ MaxA with significant cues for hand placement. Attempted gait training at hallway HR with pt unable to reach full upright position despite 3 attempts. Rises to stand with max cueing for forward reach on HR and MaxA to stand then maintain balance.   Squat pivot  transfers w/c<>mat table require MaxA with +2 providing CGA for safety. Despite education re: need to bend/ rotate trunk in order improve in performance, pt is confident he can, but unable to perform during transfer. Therefore requiring several attempts to reach seat.   Neuromuscular Re-ed: NMR facilitated during session with focus on sitting balance, decreasing overall push posteriorly, midline orientation, following instructions, cognition. Pt guided in seated balance with use of small theraball under LUE and therapist positioned behind pt to facilitate anterior pelvic rotation for improved upright posture.   Pt demos severe posterior push throughout with inability to follow instructions for forward lean. Cued to bring head over knees and place L hand on R knee improves understanding of required movements initially but is unable to hold position or correct with  same cues. Therapist moves to front of pt in attempt to facilitate forward positioning but pt with severe posterior push.   Pt repositioned in w/c facing mat table. Red discs placed on table for visual cue and pt provided with vc to reach forward to place palms on discs. Requires ModA to lean forward with guidance of RUE toward touching disc. Can maintain with MinA and continued cues to touch red discs. Pt continually attempting to pick discs up. Slow lean to L and then posteriorly. Attempts to repeat forward reach require max multimodal cues and maxA with pt unable to engage abdominals and produce forward lean.   NMR performed for  improvements in motor control and coordination, balance, sequencing, judgement, and self confidence/ efficacy in performing all aspects of mobility at highest level of independence.   Patient supine in bed at end of session with brakes locked, no alarm set d/t difficulty with alarm functioning, and all needs within reach. NT and RN notified re: lack of bed alarm.   Therapy Documentation Precautions:   Precautions Precautions: Fall, Other (comment) Precaution Comments: strong posterior and pusher Restrictions Weight Bearing Restrictions Per Provider Order: No  Pain:  Pain related with pressure into pt's R axilla. Addressed with removing pressure and pt with improvement in symptoms.   Therapy/Group: Individual Therapy  Loel Dubonnet PT, DPT, CSRS 03/02/2023, 6:42 PM

## 2023-03-02 NOTE — Telephone Encounter (Signed)
Jeffrey Gess, MD  Marilynn Rail, RN3 days ago    That is fine with me

## 2023-03-02 NOTE — Progress Notes (Addendum)
Occupational Therapy Weekly Progress Note  Patient Details  Name: Jeffrey Acevedo MRN: [id number] Date of Birth: 02/14/1941  Beginning of progress report period: February 23, 2023 End of progress report period: March 02, 2023  Today's Date: 03/02/2023 OT Individual Time: 1100-1200 OT Individual Time Calculation (min): 60 min    Patient has met 2 of 4 short term goals.  Pt has made progress with his sitting balance and motor planning with LUE.  He continues to have intense RUE/wrist pain which limits his ability to use his R hand.  He is making progress with his sit to stands but also fluctuates greatly depending on his awareness, attention, how much he is pushing.   Patient continues to demonstrate the following deficits: muscle weakness and muscle joint tightness, decreased cardiorespiratoy endurance, abnormal tone, unbalanced muscle activation, and motor apraxia, decreased visual perceptual skills and decreased visual motor skills, decreased attention to right, decreased awareness, decreased problem solving, decreased safety awareness, decreased memory, and delayed processing, and decreased sitting balance, decreased standing balance, decreased postural control, hemiplegia, and decreased balance strategies and therefore will continue to benefit from skilled OT intervention to enhance overall performance with BADL and Reduce care partner burden.  Patient not progressing toward long term goals.  See goal revision..  Plan of care revisions: .[name] Problem: RH Balance Goal: LTG Patient will maintain dynamic standing with ADLs (OT) Description: LTG:  Patient will maintain dynamic standing balance with assist during activities of daily living (OT)  Flowsheets (Taken 03/02/2023 1640) LTG: Pt will maintain dynamic standing balance during ADLs with: (LTG downgraded due to slow progress.) Maximal Assistance - Patient 25 - 49% Note: LTG downgraded due to slow progress.    Problem: Sit to Stand Goal:  LTG:  Patient will perform sit to stand in prep for activites of daily living with assistance level (OT) Description: LTG:  Patient will perform sit to stand in prep for activites of daily living with assistance level (OT) Flowsheets (Taken 03/02/2023 1640) LTG: PT will perform sit to stand in prep for activites of daily living with assistance level: (LTG downgraded due to slow progress.) Moderate Assistance - Patient 50 - 74% Note: LTG downgraded due to slow progress.    Problem: RH Bathing Goal: LTG Patient will bathe all body parts with assist levels (OT) Description: LTG: Patient will bathe all body parts with assist levels (OT) Flowsheets (Taken 03/02/2023 1640) LTG: Pt will perform bathing with assistance level/cueing: (LTG downgraded due to slow progress.) Moderate Assistance - Patient 50 - 74% Note: LTG downgraded due to slow progress.    Problem: RH Dressing Goal: LTG Patient will perform lower body dressing w/assist (OT) Description: LTG: Patient will perform lower body dressing with assist, with/without cues in positioning using equipment (OT) Flowsheets (Taken 03/02/2023 1640) LTG: Pt will perform lower body dressing with assistance level of: (LTG downgraded due to slow progress.) Maximal Assistance - Patient 25 - 49% Note: LTG downgraded due to slow progress.    Problem: RH Toileting Goal: LTG Patient will perform toileting task (3/3 steps) with assistance level (OT) Description: LTG: Patient will perform toileting task (3/3 steps) with assistance level (OT)  Flowsheets (Taken 03/02/2023 1640) LTG: Pt will perform toileting task (3/3 steps) with assistance level: (LTG downgraded due to slow progress.) Maximal Assistance - Patient 25 - 49% Note: LTG downgraded due to slow progress.    Problem: RH Functional Use of Upper Extremity Goal: LTG Patient will use RT/LT upper extremity as a (OT) Description: LTG: Patient  will use right/left upper extremity as a stabilizer/gross  assist/diminished/nondominant/dominant level with assist, with/without cues during functional activity (OT) Flowsheets (Taken 03/02/2023 1640) LTG: Use of upper extremity in functional activities: (LTG downgraded due to slow progress.) RUE as a stabilizer Note: LTG downgraded due to slow progress.    Problem: RH Tub/Shower Transfers Goal: LTG Patient will perform tub/shower transfers w/assist (OT) Description: LTG: Patient will perform tub/shower transfers with assist, with/without cues using equipment (OT) Flowsheets (Taken 03/02/2023 1640) LTG: Pt will perform tub/shower stall transfers with assistance level of: (LTG discontinued due to slow progress and pt is not safe to transfer to the shower at this time.) -- Note: LTG discontinued due to slow progress and pt is not safe to transfer to the shower at this time.               OT Short Term Goals Week 1:  OT Short Term Goal 1 (Week 1): to increase safety to be able to sit on a toilet, pt will maintain balance at EOB with CGA for at least 10 minutes. OT Short Term Goal 1 - Progress (Week 1): Met OT Short Term Goal 2 (Week 1): Pt will demonstrate improved motor planning of LUE to use L hand to help don shirt with mod A. OT Short Term Goal 2 - Progress (Week 1): Met OT Short Term Goal 3 (Week 1): Pt will use R hand to wash L arm with min A. OT Short Term Goal 3 - Progress (Week 1): Not progressing OT Short Term Goal 4 (Week 1): pt will rise to stand in stedy lift with mod A of 1 5x in a row to ensure he can safely use stedy lift for toilet transfers. OT Short Term Goal 4 - Progress (Week 1): Progressing toward goal Week 2:  OT Short Term Goal 1 (Week 2): pt will rise to stand in stedy lift with mod A of 1 5x in a row to ensure he can safely use stedy lift for toilet transfers. OT Short Term Goal 2 (Week 2): Pt will complete squat pivot to [facility] with mod A of 1 person. OT Short Term Goal 3 (Week 2): pt will hold static standing with mod A of  1 during toileting tasks.  Skilled Therapeutic Interventions/Progress Updates:    Pain:  Pt does not have pain at rest, but has significant pain in R wrist when he tries to lift forearm.  Alleviated somewhat with support from wrist splint.  R wrist circumference is 20 cm vs 18.5 on L wrist.   Pt received in w/c and stated he felt extremely uncomfortable in the chair.  He also stated " I am not even sure where I am". Reoriented pt.  With +2 A pt was transferred w/c to EOB - used bobath technique (one helper stands in front of pt and stands over pt's flexed trunk with hands on pt's low back at belt)then 2nd helper behind pt to help guide hips.  This technique helped pt bring hips up to allow for a smooth pivot.   Repeated transfer 2 more times to practice bed to drop arm BSC and then back to bed.  Once on BSC, pt held sitting balance well with no leaning.  Once returned to bed he was able to hold sit balance for close to 15 minutes. During this time,  placed pt's RUE on tray table adjacent to him. Gently removed splint. Placed voltaren gel on his wrist and pt worked on massaging it  in.  When gently lifting arm, to adjust towel under arm pt grimaced. Replaced splint. Had pt work on AROM of elbow flexion and finger flexion with towel slides.   Pt then began to lean back and had difficulty holding his balance.  Pt moved to supine with max A. Pt requested to lay on his side.  Adjusted pt on L side with pillows for support. This position is best to avoid sacral pressure.    Pt fell asleep quickly.  All needs met.   Therapy Documentation Precautions:  Precautions Precautions: Fall, Other (comment) Precaution Comments: strong posterior and pusher Restrictions Weight Bearing Restrictions Per Provider Order: No    Vital Signs: Therapy Vitals Temp: 97.7 F (36.5 C) Temp Source: Oral Pulse Rate: (!) 52 Resp: 19 BP: 117/60 Patient Position (if appropriate): Lying Oxygen Therapy SpO2: 97 % O2 Device:  Room Air ADL: ADL Eating: Supervision/safety Grooming: Minimal assistance Upper Body Bathing: Moderate assistance Where Assessed-Upper Body Bathing: Sitting at sink Lower Body Bathing: Maximal assistance Where Assessed-Lower Body Bathing: Sitting at sink Upper Body Dressing: Maximal assistance Where Assessed-Upper Body Dressing: Sitting at sink Lower Body Dressing: Dependent Where Assessed-Lower Body Dressing: Other (Comment) (with use of a stedy lift and 2 persons) Toileting: Dependent Where Assessed-Toileting: Bed level Toilet Transfer: Unable to assess (attempted transfer but not safe due to leaning) Film/video editor Method: Unable to assess     Therapy/Group: Individual Therapy  Muscab Brenneman 03/02/2023, 4:37 PM

## 2023-03-02 NOTE — Progress Notes (Addendum)
PROGRESS NOTE   Subjective/Complaints:  RIght shoulder pain , mainly with lifting RUE , xrays reviewed    ROS- limited by mental status, denies pain  Objective:   DG Swallowing Func-Speech Pathology Result Date: 02/28/2023 Table formatting from the original result was not included. Modified Barium Swallow Study Patient Details Name: Jeffrey Acevedo MRN: [id number] Date of Birth: Sep 18, 1940 Today's Date: 02/28/2023 HPI/PMH: HPI: Jeffrey Acevedo is a [name] with history of CAD, HTN, CKD 3a, chronic diastolic CHF, hearing loss, dementia, who was admitted to [facility] on 02/15/23 with reports of right sided weakness and inability hold fork or to feed himself. He was found to have acute on chronic renal failure with SCr 106,   MRI brain done revealing  acute-subacute left temporal as well as left parietal and right parietal lobes. Clinical Impression: Patient presents with mild oral dysphagia with functional pharyngeal phase. Oral phase is characterized by reduced lingual control/coordination and poor bolus prep/mastication resulting in occasional lingual pumping, oral residuals and posterior spill to pyriform sinuses. Pharyngeal phase is functional for patients age/medical status, with tracely reduced pharyngeal stripping wave, though remarkable for extremely trace transient penetration of thin liquids x1 and vallecular residuals. No other penetration/aspiration observed throughout consistencies. Vallecular residue increased post-prandially due to additional oral residue spillage. Vallecular residuals effectively cleared through second cued dry swallow. Recommend continuation of D3/thin liquid diet with second dry swallow after all consistencies. Patient with consumption of pill in puree during study, though chewed rather than swallowed whole despite instruction. Continue medications crushed in puree with upcoming trials of whole in puree w/ use  of compensatory strategies. Patient should continue to follow standardized swallowing precautions including small bites/sips, slow rate, and sitting upright at 90 degrees. Factors that may increase risk of adverse event in presence of aspiration [name] & [name] 2021): Factors that may increase risk of adverse event in presence of aspiration [name] & [name] 2021): Reduced cognitive function Recommendations/Plan: Swallowing Evaluation Recommendations Swallowing Evaluation Recommendations Recommendations: PO diet PO Diet Recommendation: Dysphagia 3 (Mechanical soft); Thin liquids (Level 0) Liquid Administration via: Cup; Straw Medication Administration: Crushed with puree Supervision: Full supervision/cueing for swallowing strategies Swallowing strategies  : Minimize environmental distractions; Slow rate; Small bites/sips; Multiple dry swallows after each bite/sip Postural changes: Position pt fully upright for meals Oral care recommendations: Oral care BID (2x/day) Treatment Plan Treatment Plan Treatment recommendations: Therapy as outlined in treatment plan below Follow-up recommendations: Home health SLP Recommendations Comment: n/a Functional status assessment: Patient has had a recent decline in their functional status and/or demonstrates limited ability to make significant improvements in function in a reasonable and predictable amount of time. Treatment frequency: Min 2x/week Treatment duration: 3 weeks Interventions: Aspiration precaution training; Compensatory techniques; Patient/family education; Diet toleration management by SLP Recommendations Recommendations for follow up therapy are one component of a multi-disciplinary discharge planning process, led by the attending physician.  Recommendations may be updated based on patient status, additional functional criteria and insurance authorization. Assessment: Orofacial Exam: Orofacial Exam Oral Cavity: Oral Hygiene: WFL Oral Cavity - Dentition: Adequate  natural dentition Orofacial Anatomy: WFL Anatomy: Anatomy: WFL Boluses Administered: Boluses Administered Boluses Administered: Thin liquids (  Level 0); Mildly thick liquids (Level 2, nectar thick); Puree; Solid  Oral Impairment Domain: Oral Impairment Domain Lip Closure: No labial escape Tongue control during bolus hold: Posterior escape of less than half of bolus Bolus preparation/mastication: Disorganized chewing/mashing with solid pieces of bolus unchewed Bolus transport/lingual motion: Repetitive/disorganized tongue motion Oral residue: Residue collection on oral structures Location of oral residue : Palate; Tongue Initiation of pharyngeal swallow : Pyriform sinuses  Pharyngeal Impairment Domain: Pharyngeal Impairment Domain Soft palate elevation: No bolus between soft palate (SP)/pharyngeal wall (PW) Laryngeal elevation: Complete superior movement of thyroid cartilage with complete approximation of arytenoids to epiglottic petiole Anterior hyoid excursion: Complete anterior movement Epiglottic movement: Complete inversion Laryngeal vestibule closure: Complete, no air/contrast in laryngeal vestibule Pharyngeal stripping wave : Present - diminished Pharyngeal contraction (A/P view only): N/A Pharyngoesophageal segment opening: Complete distension and complete duration, no obstruction of flow Tongue base retraction: Trace column of contrast or air between tongue base and PPW Pharyngeal residue: Collection of residue within or on pharyngeal structures Location of pharyngeal residue: Valleculae  Esophageal Impairment Domain: Esophageal Impairment Domain Esophageal clearance upright position: Complete clearance, esophageal coating Pill: Pill Consistency administered: Puree Puree: WFL Penetration/Aspiration Scale Score: Penetration/Aspiration Scale Score 1.  Material does not enter airway: Mildly thick liquids (Level 2, nectar thick); Puree; Solid; Pill 2.  Material enters airway, remains ABOVE vocal cords then  ejected out: Thin liquids (Level 0) Compensatory Strategies: Compensatory Strategies Compensatory strategies: Yes Multiple swallows: Effective Effective Multiple Swallows: Thin liquid (Level 0); Moderately thick liquid (Level 3, honey thick); Puree; Solid; Pill   General Information: No data recorded Diet Prior to this Study: Thin liquids (Level 0); Dysphagia 3 (mechanical soft)   No data recorded  Respiratory Status: WFL   Supplemental O2: None (Room air)   No data recorded Behavior/Cognition: Cooperative; Confused; Requires cueing Self-Feeding Abilities: Needs assist with self-feeding; Dependent for feeding (depends on physical need at the time) Baseline vocal quality/speech: Hypophonia/low volume; Abnormal resonance Volitional Cough: Able to elicit Volitional Swallow: Able to elicit Exam Limitations: No limitations Goal Planning: Prognosis for improved oropharyngeal function: Fair Barriers to Reach Goals: Cognitive deficits Barriers/Prognosis Comment: dementia Patient/Family Stated Goal: n/a Consulted and agree with results and recommendations: Patient Pain: No data recorded End of Session: Start Time:No data recorded Stop Time: No data recorded Time Calculation:No data recorded Charges: No data recorded SLP visit diagnosis: SLP Visit Diagnosis: Dysphagia, oropharyngeal phase (R13.12) Past Medical History: Past Medical History: Diagnosis Date  Acute on chronic diastolic CHF (congestive heart failure) (HCC) 01/03/2023  Cancer (HCC)   prostate cancer surgery 2011  Chronic diastolic heart failure (HCC)   Coronary artery disease   sees Dr. Erlene Quan 3 mths ago.  Stress test 03/31/2015 in epic  Degenerative joint disease   Right hip  Depression   Hearing loss of both ears   only at present wears the right ear hearing aid  Hyperlipidemia   Hypertension   Prostate cancer [facility])  Past Surgical History: Past Surgical History: Procedure Laterality Date  CARDIAC CATHETERIZATION  06/17/2009  Proximal LAD 90% stenosis just beyond  previously stented segment with a Multi-Link Vision bare-metal stent  CORONARY ANGIOPLASTY    stents placed in 1998 and 2011   CORONARY PRESSURE/FFR STUDY N/A 05/02/2021  Procedure: INTRAVASCULAR PRESSURE WIRE/FFR STUDY;  Surgeon: Runell Gess, MD;  Location: [facility];  Service: Cardiovascular;  Laterality: N/A;  CORONARY PRESSURE/FFR STUDY N/A 09/19/2021  Procedure: INTRAVASCULAR PRESSURE WIRE/FFR STUDY;  Surgeon: Runell Gess, MD;  Location: [facility];  Service: Cardiovascular;  Laterality: N/A;  RAMUS  CORONARY STENT INTERVENTION N/A 05/02/2021  Procedure: CORONARY STENT INTERVENTION;  Surgeon: Runell Gess, MD;  Location: [facility];  Service: Cardiovascular;  Laterality: N/A;  CORONARY STENT INTERVENTION N/A 09/19/2021  Procedure: CORONARY STENT INTERVENTION;  Surgeon: Runell Gess, MD;  Location: [facility];  Service: Cardiovascular;  Laterality: N/A;  CORONARY STENT INTERVENTION N/A 12/29/2022  Procedure: CORONARY STENT INTERVENTION;  Surgeon: Kathleene Hazel, MD;  Location: [facility];  Service: Cardiovascular;  Laterality: N/A;  LEFT HEART CATH AND CORONARY ANGIOGRAPHY N/A 05/02/2021  Procedure: LEFT HEART CATH AND CORONARY ANGIOGRAPHY;  Surgeon: Runell Gess, MD;  [facility]: [facility];  Service: Cardiovascular;  Laterality: N/A;  LEFT HEART CATH AND CORONARY ANGIOGRAPHY N/A 09/19/2021  Procedure: LEFT HEART CATH AND CORONARY ANGIOGRAPHY;  Surgeon: Runell Gess, MD;  [facility]: [facility];  Service: Cardiovascular;  Laterality: N/A;  NASAL SEPTUM SURGERY    Dr. Richardson Landry @ [facility]  2014  [facility]  2011  RIGHT/LEFT HEART CATH AND CORONARY ANGIOGRAPHY N/A 12/29/2022  Procedure: RIGHT/LEFT HEART CATH AND CORONARY ANGIOGRAPHY;  Surgeon: Kathleene Hazel, MD;  [facility];  Service: Cardiovascular;  Laterality: N/A;  ROBOT ASSISTED LAPAROSCOPIC RADICAL PROSTATECTOMY    TOTAL HIP  ARTHROPLASTY Right 04/19/2015  Procedure: RIGHT TOTAL HIP ARTHROPLASTY ANTERIOR APPROACH;  Surgeon: Jodi Geralds, MD;  Location: [facility];  Service: Orthopedics;  Laterality: Right; Cassidi F Sockwell 02/28/2023, 3:36 PM  Recent Labs    03/01/23 0509  WBC 7.4  HGB 12.2*  HCT 38.4*  PLT 351   Recent Labs    03/01/23 0509  NA 137  K 4.0  CL 104  CO2 26  GLUCOSE 87  BUN 23  CREATININE 1.34*  CALCIUM 9.6    Intake/Output Summary (Last 24 hours) at 03/02/2023 0737 Last data filed at 03/02/2023 8657 Gross per 24 hour  Intake 860 ml  Output 200 ml  Net 660 ml     Pressure Injury 09/19/21 Coccyx Unstageable - Full thickness tissue loss in which the base of the injury is covered by slough (yellow, tan, gray, green or [place]ock) and/or eschar (tan, Loy or black) in the wound bed. oval shaped wound on coccyx with yello (Active)  09/19/21 1450  Location: Coccyx  Location Orientation:   Staging: Unstageable - Full thickness tissue loss in which the base of the injury is covered by slough (yellow, tan, gray, green or [place]) and/or eschar (tan, [place]ham or black) in the wound bed.  Wound Description (Comments): oval shaped wound on coccyx with yellow slough  Present on Admission: Yes (Originally listed as stage 1 09/19/21; labeled as unstageable 02/22/23 on admit to CIR)    Physical Exam: Vital Signs Blood pressure 118/68, pulse (!) 52, temperature 98.2 F (36.8 C), resp. rate 18, height 6' (1.829 m), weight 77 kg, SpO2 93%.  General: No acute distress Mood and affect are appropriate Heart: Regular rate and rhythm no rubs murmurs or extra sounds Lungs: Clear to auscultation, breathing unlabored, no rales or wheezes Abdomen: Positive bowel sounds, soft nontender to palpation, nondistended Extremities: No clubbing, cyanosis, or edema Skin: No evidence of breakdown, no evidence of rash   Neurologic: Cranial nerves II through XII intact, motor strength is 5/5 in Left and 4/5 RIght deltoid,  bicep, tricep, grip, 5/5 bilateral hip flexor, knee extensors, ankle dorsiflexor and plantar  flexor Oriented to person but not time   Musculoskeletal: no pain with AROM in all 4 ext  No joint swelling    Assessment/Plan: 1. Functional deficits which require 3+ hours per day of interdisciplinary therapy in a comprehensive inpatient rehab setting. Physiatrist is providing close team supervision and 24 hour management of active medical problems listed below. Physiatrist and rehab team continue to assess barriers to discharge/monitor patient progress toward functional and medical goals  Care Tool:  Bathing    Body parts bathed by patient: Chest, Abdomen, Face, Right arm   Body parts bathed by helper: Left arm, Front perineal area, Buttocks, Right upper leg, Left lower leg, Right lower leg, Left upper leg     Bathing assist Assist Level: Maximal Assistance - Patient 24 - 49%     Upper Body Dressing/Undressing Upper body dressing   What is the patient wearing?: Pull over shirt    Upper body assist Assist Level: Moderate Assistance - Patient 50 - 74%    Lower Body Dressing/Undressing Lower body dressing      What is the patient wearing?: Pants     Lower body assist Assist for lower body dressing: Maximal Assistance - Patient 25 - 49%     Toileting Toileting    Toileting assist Assist for toileting: 2 Helpers     Transfers Chair/bed transfer  Transfers assist  Chair/bed transfer activity did not occur: Safety/medical concerns (unsafe to get up)  Chair/bed transfer assist level: 2 Helpers (MAX A +2 squat pivot)     Locomotion Ambulation   Ambulation assist   Ambulation activity did not occur: Safety/medical concerns          Walk 10 feet activity   Assist  Walk 10 feet activity did not occur: Safety/medical concerns        Walk 50 feet activity   Assist Walk 50 feet with 2 turns activity did not occur: Safety/medical concerns         Walk  150 feet activity   Assist Walk 150 feet activity did not occur: Safety/medical concerns         Walk 10 feet on uneven surface  activity   Assist Walk 10 feet on uneven surfaces activity did not occur: Safety/medical concerns         Wheelchair     Assist Is the patient using a wheelchair?: Yes Type of Wheelchair: Manual    Wheelchair assist level: Dependent - Patient 0%      Wheelchair 50 feet with 2 turns activity    Assist        Assist Level: Dependent - Patient 0%   Wheelchair 150 feet activity     Assist      Assist Level: Dependent - Patient 0%   Blood pressure 118/68, pulse (!) 52, temperature 98.2 F (36.8 C), resp. rate 18, height 6' (1.829 m), weight 77 kg, SpO2 93%.  Medical Problem List and Plan: 1. 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  Antithrombotics: -DVT/anticoagulation:  Pharmaceutical: Lovenox             -antiplatelet therapy: DAPT 3. Pain Management: Tylenol prn. Gabapentin 100  mg TID was added on 11/28 RIght shoulder pain  hemiparetic side , exam consistent with impingement syndrome but pt also states he fell on it during CVA onset - xray neg, will limit overhead activities with PT, OT  Add voltaren gel  4. Mood/Behavior/Sleep:  LCSW to follow for evaluation and support.              -antipsychotic agents: N/A--monitor for delirium/MS changes with recent stroke/change in environment insetting of baseline dementia. 5. Neuropsych/cognition: This patient may be intermittently capable of making decisions on his own behalf.             --family has noticed some disorientation and have been present 24/7 at [facility]. Unable to do that here             --also [facility] and wears bilateral hearing aids. 6. Skin/Wound Care:  Routine pressure relief  pressure.   7. Fluids/Electrolytes/Nutrition: Monitor I/O. Check CMET in am             --Monitor I/O. Lasix was d/c yesterday for ???  8. CAD/Chronic diastolic CHF: Followed by Dr. Allyson Sabal. HH diet with strict I/O. Daily weights             --Most recent PCI 12/29/2022 followed by admissions for fluid overload Continue Jardiance , Lopressor , aldactone              --was on Lasix 40 mg BID PTA-->d/c on 12/04 for ??. Monitor for signs of overload  9. Chronic respiratory failure w/nocturnal hypoxia: Since Sept--sleep study negative for OSA             --currently oxygen dependent with activity and intermittently during hospitalization --Encourage pulmonary hygiene. Will check CXR to rule out overload.              --Oxygen prn during the day and continue at nights.    10. New diagnosis B-cell lymphoma: Developing multiple nodules for past 2 months- plan was for OP f/u with oncology in [place],[facility] to set up f/u with  Dr Melvyn Neth in [place]              --just received bx reports yesterday-->called derm for path report             --question coagulopathy as cause of stroke. Will check dopplers          Per onc Non Hodgkins Lymphoma + comorbid conditions qualify pt for in home hospice.  Per Onc consult palliative care consult recommended  but not ordered . Will order 11/13   11. CKD 3a: Baseline SCr 1.4-1.5 per records             --monitor weekly  12. Hx of anemia of chronic disease:Hgb stable as well as B12            --continue iron supplement    Latest Ref Rng & Units 03/01/2023    5:09 AM 02/26/2023    5:32 AM 02/23/2023    6:31 AM  CBC  WBC 4.0 - 10.5 K/uL 7.4  7.6  8.4   Hemoglobin 13.0 - 17.0 g/dL 84.6  96.2  95.2   Hematocrit 39.0 - 52.0 % 38.4  37.8  37.5   Platelets 150 - 400 K/uL 351  350  337     13. Hx of vitamin B 12 deficiency: Continue high doses of B12   14. Chronic left knee pain: Monitor for symptoms with increase in activity/Right hemipareis  15.  Hx  major depressive d/o: continue zoloft.   16. Hx prostate cancer- sounds like could be indolent:Has urgency/nocturia. Toilet every 4 hours. Continue to check PVRs  17. Hx prediabetes: Hgb A 1c- 5.9 few months ago. Monitor fasting BS with serial labs.          LOS: 8 days A FACE TO FACE EVALUATION WAS PERFORMED  Erick Colace 03/02/2023, 7:37 AM

## 2023-03-02 NOTE — Progress Notes (Signed)
 Patient ID: Jeffrey Acevedo, male   DOB: 09-01-1940, 82 y.o.   MRN: [id number]  No DME or FU recommendations currently.

## 2023-03-02 NOTE — Progress Notes (Signed)
Physical Therapy Weekly Progress Note  Patient Details  Name: Jeffrey Acevedo MRN: [id number] Date of Birth: 1940-07-25  Beginning of progress report period: February 23, 2023 End of progress report period: March 02, 2023  Patient has met 2 of 5 short term goals. Pt has made minimal progress towards LTG's due to inconsistent motor planning, sundowning leading to increase in confusion and lethargy during morning sessions, increase in R UE pain (some days are less painful than others with R UE). Pt has met the goal of PT assessing gait with pt stepping 8' with heavy maxA + 2 with WC follow, and goal of supine<>sit with modA. Pt has partly met goal of performing sit to stands with maxA (can do so with STEDY or with UE support, but unable to maintain or achieve hip/knee extension in standing. Pt continues to present with pushing with L UE that hinders required weight shifting for transfers and sitting balance. Pt also continues to present with delayed processing, initiation, inconsistent communicative skills and confusion.   Patient continues to demonstrate the following deficits {impairments:3041632} and therefore will continue to benefit from skilled PT intervention to increase functional independence with mobility.  Patient {LTG progression:3041653}.  {plan of LOVF:6433295}  PT Short Term Goals Week 1:  PT Short Term Goal 1 (Week 1): Pt will transfer sup <> sit w/ mod A consistently. PT Short Term Goal 1 - Progress (Week 1): Met PT Short Term Goal 2 (Week 1): Pt will transfer sit to stand w/ max A. PT Short Term Goal 2 - Progress (Week 1): Partly met PT Short Term Goal 3 (Week 1): Pt will maintain seated balance > 5' at EOB. PT Short Term Goal 3 - Progress (Week 1): Not met PT Short Term Goal 4 (Week 1): Pt will transfer SPT w/ max A +1. PT Short Term Goal 4 - Progress (Week 1): Not met PT Short Term Goal 5 (Week 1): PT to assess gait. PT Short Term Goal 5 - Progress (Week 1):  Met   Therapy Documentation Precautions:  Precautions Precautions: Fall, Other (comment) Precaution Comments: strong posterior and pusher Restrictions Weight Bearing Restrictions Per Provider Order: No   Secret Kristensen PTA  03/02/2023, 4:16 PM

## 2023-03-02 NOTE — Progress Notes (Signed)
Physical Therapy Session Note  Patient Details  Name: Jeffrey Acevedo MRN: [id number] Date of Birth: October 27, 1940  Today's Date: 03/02/2023 PT Individual Time: 1000-1030 PT Individual Time Calculation (min): 30 min   Short Term Goals: Week 1:  PT Short Term Goal 1 (Week 1): Pt will transfer sup <> sit w/ mod A consistently. PT Short Term Goal 2 (Week 1): Pt will transfer sit to stand w/ max A. PT Short Term Goal 3 (Week 1): Pt will maintain seated balance > 5' at EOB. PT Short Term Goal 4 (Week 1): Pt will transfer SPT w/ max A +1. PT Short Term Goal 5 (Week 1): PT to assess gait.  Skilled Therapeutic Interventions/Progress Updates:    pt received in bed and agreeable to therapy. Pt reports no pain at this time, but he does sometimes as pain in his RUE, no intervention required.   Donned shoes tot a, multimodal cueing required for single step tasks while donning shoes and placing feet on leg rests. Pt transported to therapy gym for time management and energy conservation.   Pt participated in several rounds of kinetron at 90cm/sec with small ROM. Pt required frequent cueing and eventually  active assist for RLE. Challenge increased by lap tray preventing visual connection with foot.   Pt also participated in assisted sit ups using kinetron handle in LUE. Tactile cues for midline and pulling with LUE. Did not note pushing with this activity.   Pt returned to room, remained in w/c, was left with all needs in reach and alarm active.   Therapy Documentation Precautions:  Precautions Precautions: Fall, Other (comment) Precaution Comments: strong posterior and pusher Restrictions Weight Bearing Restrictions Per Provider Order: No General:      Therapy/Group: Individual Therapy  Juluis Rainier 03/02/2023, 11:06 AM

## 2023-03-02 NOTE — Plan of Care (Addendum)
  Problem: RH Balance Goal: LTG Patient will maintain dynamic standing with ADLs (OT) Description: LTG:  Patient will maintain dynamic standing balance with assist during activities of daily living (OT)  Flowsheets (Taken 03/02/2023 1640) LTG: Pt will maintain dynamic standing balance during ADLs with: (LTG downgraded due to slow progress.) Maximal Assistance - Patient 25 - 49% Note: LTG downgraded due to slow progress.    Problem: Sit to Stand Goal: LTG:  Patient will perform sit to stand in prep for activites of daily living with assistance level (OT) Description: LTG:  Patient will perform sit to stand in prep for activites of daily living with assistance level (OT) Flowsheets (Taken 03/02/2023 1640) LTG: PT will perform sit to stand in prep for activites of daily living with assistance level: (LTG downgraded due to slow progress.) Moderate Assistance - Patient 50 - 74% Note: LTG downgraded due to slow progress.    Problem: RH Bathing Goal: LTG Patient will bathe all body parts with assist levels (OT) Description: LTG: Patient will bathe all body parts with assist levels (OT) Flowsheets (Taken 03/02/2023 1640) LTG: Pt will perform bathing with assistance level/cueing: (LTG downgraded due to slow progress.) Moderate Assistance - Patient 50 - 74% Note: LTG downgraded due to slow progress.    Problem: RH Dressing Goal: LTG Patient will perform lower body dressing w/assist (OT) Description: LTG: Patient will perform lower body dressing with assist, with/without cues in positioning using equipment (OT) Flowsheets (Taken 03/02/2023 1640) LTG: Pt will perform lower body dressing with assistance level of: (LTG downgraded due to slow progress.) Maximal Assistance - Patient 25 - 49% Note: LTG downgraded due to slow progress.    Problem: RH Toileting Goal: LTG Patient will perform toileting task (3/3 steps) with assistance level (OT) Description: LTG: Patient will perform toileting task (3/3  steps) with assistance level (OT)  Flowsheets (Taken 03/02/2023 1640) LTG: Pt will perform toileting task (3/3 steps) with assistance level: (LTG downgraded due to slow progress.) Maximal Assistance - Patient 25 - 49% Note: LTG downgraded due to slow progress.    Problem: RH Functional Use of Upper Extremity Goal: LTG Patient will use RT/LT upper extremity as a (OT) Description: LTG: Patient will use right/left upper extremity as a stabilizer/gross assist/diminished/nondominant/dominant level with assist, with/without cues during functional activity (OT) Flowsheets (Taken 03/02/2023 1640) LTG: Use of upper extremity in functional activities: (LTG downgraded due to slow progress.) RUE as a stabilizer Note: LTG downgraded due to slow progress.    Problem: RH Tub/Shower Transfers Goal: LTG Patient will perform tub/shower transfers w/assist (OT) Description: LTG: Patient will perform tub/shower transfers with assist, with/without cues using equipment (OT) Flowsheets (Taken 03/02/2023 1640) LTG: Pt will perform tub/shower stall transfers with assistance level of: (LTG discontinued due to slow progress and pt is not safe to transfer to the shower at this time.) -- Note: LTG discontinued due to slow progress and pt is not safe to transfer to the shower at this time.

## 2023-03-02 NOTE — Progress Notes (Signed)
Physical Therapy Session Note  Patient Details  Name: Jeffrey Acevedo MRN: [id number] Date of Birth: Sep 14, 1940  Today's Date: 03/02/2023 PT Individual Time: 0915-0940 PT Individual Time Calculation (min): 25 min   Short Term Goals: Week 1:  PT Short Term Goal 1 (Week 1): Pt will transfer sup <> sit w/ mod A consistently. PT Short Term Goal 2 (Week 1): Pt will transfer sit to stand w/ max A. PT Short Term Goal 3 (Week 1): Pt will maintain seated balance > 5' at EOB. PT Short Term Goal 4 (Week 1): Pt will transfer SPT w/ max A +1. PT Short Term Goal 5 (Week 1): PT to assess gait.  Skilled Therapeutic Interventions/Progress Updates: Patient supine in bed with nsg present providing medication on entrance to room. Patient alert and agreeable to PT session.   Patient reported R UE pain (unrated) with attending MD noting to have placed order to avoid flexion and abduction at R shoulder greater than 90*.  Therapeutic Activity: Bed Mobility: Pt performed supine to R and L sidelying with heavy modA, and VC to use bed features to assist. PTA donned new brief (previous brief slightly soiled) and personal shorts with VC for pt to bridge at hips (maxA to donn shorts due to pt's difficulty to motor plan and follow cue to pull shorts by waist). Pt also attempted to get OOB when cued to roll to opposite side to doff brief. Pt then performed supine to to EOB with maxA. Pt with heavy push and lean posteriorly (pushing with L UE in locked extension at elbow, and required max multimodal cues to place in supination in lap in order to anteriorly scoot to edge. Transfers: Pt performed stand pivot (closer to squat pivot) transfer from EOB to [facility] with heavy maxA, and max cues to stand upright. Pt required x 3 attempts to stand safely (moved to midway between stand pivot and squat pivot for safety due to pt's process delay). Pt required increased assistance this session during sit to stand vs previous sessions with this  PTA, and required heavy maxA + 2 to posteriorly scoot in WC after transfer.   Patient sitting in WC at end of session with brakes locked, belt alarm set, and all needs within reach.      Therapy Documentation Precautions:  Precautions Precautions: Fall, Other (comment) Precaution Comments: strong posterior and pusher Restrictions Weight Bearing Restrictions Per Provider Order: No   Therapy/Group: Individual Therapy  Davyd Podgorski PTA 03/02/2023, 12:29 PM

## 2023-03-03 DIAGNOSIS — Z7189 Other specified counseling: Secondary | ICD-10-CM

## 2023-03-03 DIAGNOSIS — I6340 Cerebral infarction due to embolism of unspecified cerebral artery: Secondary | ICD-10-CM

## 2023-03-03 DIAGNOSIS — Z515 Encounter for palliative care: Secondary | ICD-10-CM

## 2023-03-03 DIAGNOSIS — I639 Cerebral infarction, unspecified: Secondary | ICD-10-CM | POA: Diagnosis not present

## 2023-03-03 NOTE — Consult Note (Signed)
Palliative Medicine Inpatient Consult Note  Consulting Provider:  Erick Colace, MD   Reason for consult:   Palliative Care Consult Services Palliative Medicine Consult  Reason for Consult? possible home hospice   Family still not sure in terms of GOC,still considering chemo as OP ,  per onc, NHL+ comorbidities =<1mo survival   03/03/2023  HPI:  Per intake H&P --> Jeffrey Acevedo is a [name] with history of CAD, HTN, CKD 3a, chronic diastolic CHF, B-Cell Lymphoma, hearing loss, dementia, who was admitted to [facility] on 02/15/23 with reports of right sided weakness and inability hold fork or to feed himself.   The Palliative care team has been asked to support additional goals of care conversations.   Clinical Assessment/Goals of Care:  *Please note that this is a verbal dictation therefore any spelling or grammatical errors are due to the "Dragon Medical One" system interpretation.  I have reviewed medical records including EPIC notes, labs and imaging, received report from bedside RN, assessed the patient who is lying in bed in NAD.    I met with Jeffrey Acevedo to further discuss diagnosis prognosis, GOC, EOL wishes, disposition and options.   I introduced Palliative Medicine as specialized medical care for people living with serious illness. It focuses on providing relief from the symptoms and stress of a serious illness. The goal is to improve quality of life for both the patient and the family.  Medical History Review and Understanding:  A review of Jeffrey Acevedo's past medical history inclusive of chronic kidney disease, diastolic heart failure, coronary artery disease, hearing loss, and likely vascular dementia was held.  Social History:  I reviewed with Jeffrey Acevedo that he spends majority of his life in [place].  He has been married for over 54 years.  He has 2 children and 5 grandchildren.  He formally worked as a Ecologist.  He shares that  he used to love coaching football, basketball, and baseball.  Jeffrey Acevedo expresses that he is an avid lever of the outdoors and used to enjoy fishing.  He is a man of faith, practicing within the [facility] denomination.  Functional and Nutritional State:  Prior to hospitalization Jeffrey Acevedo had recently moved in with his daughter, Jeffrey Acevedo.  He was able to attend to his basic needs as far as bathing and his son-in-law helps with that.  He did have a good appetite overall.  Advance Directives:  A detailed discussion was had today regarding advanced directives.  Patient does not have advanced directives on file.  He shares that his wife, Jeffrey Acevedo is his Runner, broadcasting/film/video.  Code Status:  Concepts specific to code status, artifical feeding and hydration, continued IV antibiotics and rehospitalization was had.  The difference between a aggressive medical intervention path  and a palliative comfort care path for this patient at this time was had.   Encouraged patient/family to consider DNR/DNI status understanding evidenced based poor outcomes in similar hospitalized patient, as the cause of arrest is likely associated with advanced chronic/terminal illness rather than an easily reversible acute cardio-pulmonary event. I explained that DNR/DNI does not change the medical plan and it only comes into effect after a person has arrested (died).  It is a protective measure to keep [place] from harming the patient in their last moments of life.   Jeffrey Acevedo shares that he will speak to his family more about this.  I did emphasize my concern in the setting of his underlying disease burden  that if we pursue these efforts we may cause him more harm than benefit  Discussion:  Jeffrey Acevedo shares that his strokes have altered the trajectory of his life.  He states quite eloquently that this has "masterfully changed my life".  We reviewed the loss of his independence and what that will mean for him in the short and long-term.  We  discussed Jeffrey Acevedo's B-cell lymphoma and the consideration of treatment versus no treatment.  Jeffrey Acevedo would like more information related to this before making a decision.  I did take the opportunity well with him to express I described the use of hospice services should he get to a point where treatment is either not appropriate or he does not desire it.  Jeffrey Acevedo and I discussed that I would call his wife and daughter for further conversations.  Discussed the importance of continued conversation with family and their  medical providers regarding overall plan of care and treatment options, ensuring decisions are within the context of the patients values and GOCs. ____________________ Addendum:  I spoke to Jeffrey Acevedo's wife, Jeffrey Acevedo as well as his daughter, Jeffrey Acevedo who he lives with over the phone.  We reviewed the above.   We discussed possible prognosis in the setting of non-Hodgkin's lymphoma as well as 's underlying disease burden. Patient's family have similar interest in hearing from the outpatient oncology team about possible treatment though they also realize the possible need for hospice care in the near future. In the meanwhile patient's family are open to outpatient palliative support and have already called hospice of [facility] to start that process.  We discussed the importance of further consideration of patient's resuscitation status which family do feel they will have the time to talk about when he is not in such a disorienting environment.  Provided information and insights on delirium and how it affects geriatric patients who have undergone stroke(s).  Reviewed the functional deficits that patient will now suffered as a result of his strokes.  I shared that I would reach out to the PT/OT team to help with educating family on how to work with patient once he is in his home environment.  Patient's family shared that they already have a education session set up next week preceding  patient's discharge on Friday, December 20.  Discussed that patient's family are hoping to obtain additional DME such as a hospital bed, a [place] Dec steady, and a transport wheelchair.  I shared that I would reach out to the transitions of care team to coordinate this.  Reviewed that at home Daquavious will have a caregiver, his daughter, his wife, and son-in-law this will be a rotational schedule.  Family worried they may need additional help which I shared that I would ask the transitions of care team to help coordinate.  Plan to allow time for outcomes.  Decision Maker: Acevedo,Jeffrey (Spouse): [phone] (Mobile)   SUMMARY OF RECOMMENDATIONS   Full code/full scope of care --> patient's family plan to discuss CODE STATUS in greater detail amongst themselves  Open and honest conversations held in the setting of patient's newly identified lymphoma --> patient's family would like to hear from outside oncology team prior to making decisions  Appreciate TOC helping with DME  Outpatient palliative support with hosp[facility] of [facility]  Ongoing PMT support  Code Status/Advance Care Planning: FULL CODE  Palliative Prophylaxis:  Aspiration, Bowel Regimen, Delirium Protocol, Frequent Pain Assessment, Oral Care, Palliative Wound Care, and Turn Reposition  Additional Recommendations (Limitations, Scope, Preferences): Continue current care  Psycho-social/Spiritual:  Desire for further Chaplaincy support: Not at this time patient is [facility] Additional Recommendations: Education on long-term deficits and expectations in the setting of newly identified lymphoma   Prognosis: Patient would be appropriate for hospice though family wants to hear from outside oncology service for further decision making.  Discharge Planning: Discharge plan will be home with supportive services.  Vitals:   03/02/23 1917 03/03/23 0459  BP: (!) 115/58 102/73  Pulse: (!) 56 (!) 56  Resp: 18 16  Temp: 97.8 F (36.6  C) 98.3 F (36.8 C)  SpO2: 92% 93%    Intake/Output Summary (Last 24 hours) at 03/03/2023 1610 Last data filed at 03/02/2023 1300 Gross per 24 hour  Intake 480 ml  Output --  Net 480 ml   Last Weight  Most recent update: 02/28/2023  4:01 AM    Weight  77 kg (169 lb 12.1 oz)            Gen: Elderly Caucasian male chronically ill-appearing HEENT: moist mucous membranes CV: Regular rate and rhythm  PULM: On room air breathing is even and nonlabored ABD: soft/nontender  EXT: No edema  Neuro: Alert and oriented x2-3 --> hard of hearing  PPS:  40%   This conversation/these recommendations were discussed with patient primary care team, Dr. Wynn Banker  Total Time: 24 Billing based on MDM: HIgh  Problems Addressed: One acute or chronic illness or injury that poses a threat to life or bodily function  Amount and/or Complexity of Data: Category 3:Discussion of management or test interpretation with external physician/other qualified health care professional/appropriate source (not separately reported)  Risks: Decision regarding hospitalization or escalation of hospital care and Decision not to resuscitate or to de-escalate care because of poor prognosis ______________________________________________________ Lamarr Lulas [place] Palliative Medicine Team Team Cell Phone: [phone] Please utilize secure chat with additional questions, if there is no response within 30 minutes please call the above phone number  Palliative Medicine Team providers are available by phone from 7am to 7pm daily and can be reached through the team cell phone.  Should this patient require assistance outside of these hours, please call the patient's attending physician.

## 2023-03-03 NOTE — Progress Notes (Addendum)
PROGRESS NOTE   Subjective/Complaints:  LBM yesterday.   Is c/o Sore throat.   R shoulder pain is a lot better.   Doesn't want throat spray as of yet.    ROS- limited by mental status. Negative except for as above.  Objective:   No results found.  Recent Labs    03/01/23 0509  WBC 7.4  HGB 12.2*  HCT 38.4*  PLT 351   Recent Labs    03/01/23 0509  NA 137  K 4.0  CL 104  CO2 26  GLUCOSE 87  BUN 23  CREATININE 1.34*  CALCIUM 9.6    Intake/Output Summary (Last 24 hours) at 03/03/2023 1204 Last data filed at 03/02/2023 1300 Gross per 24 hour  Intake 240 ml  Output --  Net 240 ml     Pressure Injury 09/19/21 Coccyx Unstageable - Full thickness tissue loss in which the base of the injury is covered by slough (yellow, tan, gray, green or Struthers) and/or eschar (tan, Frost or black) in the wound bed. oval shaped wound on coccyx with yello (Active)  09/19/21 1450  Location: Coccyx  Location Orientation:   Staging: Unstageable - Full thickness tissue loss in which the base of the injury is covered by slough (yellow, tan, gray, green or Gainer) and/or eschar (tan, Kopf or black) in the wound bed.  Wound Description (Comments): oval shaped wound on coccyx with yellow slough  Present on Admission: Yes (Originally listed as stage 1 09/19/21; labeled as unstageable 02/22/23 on admit to CIR)    Physical Exam: Vital Signs Blood pressure 102/73, pulse (!) 56, temperature 98.3 F (36.8 C), resp. rate 16, height 6' (1.829 m), weight 77 kg, SpO2 93%.   General: awake, alert, appropriate, NAD HENT: conjugate gaze; oropharynx dry- c/o sore throat;  CV: regular  rhythm, bradycardic rate; no JVD Pulmonary: CTA B/L; no W/R/R- good air movement GI: soft, NT, ND, (+)BS Psychiatric: flat- quiet Neurological: dysarthric and also has word finding issues  Neurologic: Cranial nerves II through XII intact, motor strength is 5/5  in Left and 4/5 RIght deltoid, bicep, tricep, grip, 5/5 bilateral hip flexor, knee extensors, ankle dorsiflexor and plantar flexor Oriented to person but not time   Musculoskeletal: no pain with AROM in all 4 ext  No joint swelling    Assessment/Plan: 1. Functional deficits which require 3+ hours per day of interdisciplinary therapy in a comprehensive inpatient rehab setting. Physiatrist is providing close team supervision and 24 hour management of active medical problems listed below. Physiatrist and rehab team continue to assess barriers to discharge/monitor patient progress toward functional and medical goals  Care Tool:  Bathing    Body parts bathed by patient: Chest, Abdomen, Face, Right arm   Body parts bathed by helper: Left arm, Front perineal area, Buttocks, Right upper leg, Left lower leg, Right lower leg, Left upper leg     Bathing assist Assist Level: Maximal Assistance - Patient 24 - 49%     Upper Body Dressing/Undressing Upper body dressing   What is the patient wearing?: Pull over shirt    Upper body assist Assist Level: Moderate Assistance - Patient 50 - 74%  Lower Body Dressing/Undressing Lower body dressing      What is the patient wearing?: Pants     Lower body assist Assist for lower body dressing: Maximal Assistance - Patient 25 - 49%     Toileting Toileting    Toileting assist Assist for toileting: 2 Helpers     Transfers Chair/bed transfer  Transfers assist  Chair/bed transfer activity did not occur: Safety/medical concerns (unsafe to get up)  Chair/bed transfer assist level: Total Assistance - Patient < 25%     Locomotion Ambulation   Ambulation assist   Ambulation activity did not occur: Safety/medical concerns  Assist level: Total Assistance - Patient < 25% Assistive device: Other (comment) (3 musketeer with WC follow) Max distance: 8   Walk 10 feet activity   Assist  Walk 10 feet activity did not occur: Safety/medical  concerns        Walk 50 feet activity   Assist Walk 50 feet with 2 turns activity did not occur: Safety/medical concerns         Walk 150 feet activity   Assist Walk 150 feet activity did not occur: Safety/medical concerns         Walk 10 feet on uneven surface  activity   Assist Walk 10 feet on uneven surfaces activity did not occur: Safety/medical concerns         Wheelchair     Assist Is the patient using a wheelchair?: Yes Type of Wheelchair: Manual    Wheelchair assist level: Dependent - Patient 0%      Wheelchair 50 feet with 2 turns activity    Assist    Wheelchair 50 feet with 2 turns activity did not occur: Safety/medical concerns   Assist Level: Dependent - Patient 0%   Wheelchair 150 feet activity     Assist  Wheelchair 150 feet activity did not occur: Safety/medical concerns   Assist Level: Dependent - Patient 0%   Blood pressure 102/73, pulse (!) 56, temperature 98.3 F (36.8 C), resp. rate 16, height 6' (1.829 m), weight 77 kg, SpO2 93%.  Medical Problem List and Plan: 1. 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  Antithrombotics: -DVT/anticoagulation:  Pharmaceutical: Lovenox             -antiplatelet therapy: DAPT 3. Pain Management: Tylenol prn. Gabapentin 100 mg TID was added on 11/28  1214- pain in R shoulder controlled- c/o sore throat- con't regimen- denies throat spray.  RIght shoulder pain  hemiparetic side , exam consistent with impingement syndrome but pt also states he fell on it during CVA onset - xray neg, will limit overhead activities with PT, OT  Add voltaren gel  4. Mood/Behavior/Sleep:  LCSW to follow for evaluation and support.              -antipsychotic agents: N/A--monitor for delirium/MS changes  with recent stroke/change in environment insetting of baseline dementia. 5. Neuropsych/cognition: This patient may be intermittently capable of making decisions on his own behalf.             --family has noticed some disorientation and have been present 24/7 at [facility]. Unable to  do that here             --also [facility] and wears bilateral hearing aids. 6. Skin/Wound Care:  Routine pressure relief pressure.   7. Fluids/Electrolytes/Nutrition: Monitor I/O. Check CMET in am             --Monitor I/O. Lasix was d/c yesterday for ???  8. CAD/Chronic diastolic CHF: Followed by Dr. Allyson Sabal. HH diet with strict I/O. Daily weights             --Most recent PCI 12/29/2022 followed by admissions for fluid overload Continue Jardiance , Lopressor , aldactone              --was on Lasix 40 mg BID PTA-->d/c on 12/04 for ??. Monitor for signs of overload  12/14- hasn't had weights checked for 3 days- will d/w nursing.  9. Chronic respiratory failure w/nocturnal hypoxia: Since Sept--sleep study negative for OSA             --currently oxygen dependent with activity and intermittently during hospitalization --Encourage pulmonary hygiene. Will check CXR to rule out overload.              --Oxygen prn during the day and continue at nights.    10. New diagnosis B-cell lymphoma: Developing multiple nodules for past 2 months- plan was for OP f/u with oncology in [place],[facility] to set up f/u with  Dr Melvyn Neth in [place]              --just received bx reports yesterday-->called derm for path report             --question coagulopathy as cause of stroke. Will check dopplers          Per onc Non Hodgkins Lymphoma + comorbid conditions qualify pt for in home hospice.  Per Onc consult palliative care consult recommended  but not ordered . Will order 11/13   12/14- Seen by palliative care-pt wants to hear from outside oncology before deciding on NDR status/ vs full code.   11. CKD 3a: Baseline SCr 1.4-1.5 per records              --monitor weekly  12. Hx of anemia of chronic disease:Hgb stable as well as B12            --continue iron supplement    Latest Ref Rng & Units 03/01/2023    5:09 AM 02/26/2023    5:32 AM 02/23/2023    6:31 AM  CBC  WBC 4.0 - 10.5 K/uL 7.4  7.6  8.4   Hemoglobin 13.0 - 17.0 g/dL 74.2  59.5  63.8   Hematocrit 39.0 - 52.0 % 38.4  37.8  37.5   Platelets 150 - 400 K/uL 351  350  337     13. Hx of vitamin B 12 deficiency: Continue high doses of B12   14. Chronic left knee pain: Monitor for symptoms with increase in activity/Right hemipareis  15. Hx major depressive d/o: continue zoloft.   16. Hx prostate cancer- sounds like could be indolent:Has urgency/nocturia. Toilet every 4 hours. Continue to check PVRs  17. Hx prediabetes: Hgb A 1c- 5.9 few months ago. Monitor fasting BS with serial labs.   18. Sore throat  12/14- pt doesn't want throat spray- can add if needed.    I spent a total of 36    minutes on total care today- >50% coordination of care- due to  D/w nursing about weights and pt's function  and went over labs, vitals, and palliative care note.         LOS: 9 days A FACE TO FACE EVALUATION WAS PERFORMED  Shahira Fiske 03/03/2023, 12:04 PM

## 2023-03-03 NOTE — Progress Notes (Signed)
Speech Language Pathology Daily Session Note  Patient Details  Name: Jeffrey Acevedo MRN: [id number] Date of Birth: 1940/07/08  Today's Date: 03/03/2023 SLP Individual Time: 0902-1000 SLP Individual Time Calculation (min): 58 min  Short Term Goals: Week 1: SLP Short Term Goal 1 (Week 1): Patient will utilize swallowing compensatory strategies during consumption of D3/thin liquid diet given min multimodal A SLP Short Term Goal 2 (Week 1): Patient will demonstrate orientation to self, place, and situation given max multimodal A SLP Short Term Goal 3 (Week 1): Patient will increase speech intelligibility to 80% at the phrase level given mod multimodal A SLP Short Term Goal 4 (Week 1): Patient will recall biographical information given max multimodal A SLP Short Term Goal 5 (Week 1): Patient will demonstrate focused attention during functional therapeutic tasks given max multimodal A SLP Short Term Goal 6 (Week 1): Patient will demonstrate awareness of current medical situation given max multimodal A  Skilled Therapeutic Interventions: Pt seen for skilled ST with focus on cognitive communication and swallowing goals, pt in bed and agreeable to therapeutic tasks. Pt quite calm and engaged throughout session, moments of tearfulness in dealing with current medical challenges. SLP facilitating sustained attention to conversation level interaction by providing min A verbal cues. Pt able to recall biographical information with Supervision-min A cues, again tearful discussing life history and family members. Pt verbose but focused, speech intelligibility ~75% at short sentence level with min A cues for use of speech intelligibility strategies (primarily increased vocal volume/intensity). Pt unable to independently recall swallow strategies recommended via MBS, re-educated on double swallow and checking for R pocketing. At this time, pt attempting to get out of bed independent to utilize bathroom, nursing and  SLP assisting patient to bathroom via Stedy +2. Pt demonstrates difficulty following directions during functional transfer tasks, required mod-max multimodal cues. Pt left in bathroom with nurse and NT, cont ST POC.   Pain Pain Assessment Pain Scale: 0-10 Pain Score: 0-No pain  Therapy/Group: Individual Therapy  Tacey Ruiz 03/03/2023, 11:57 AM

## 2023-03-03 NOTE — Progress Notes (Signed)
Physical Therapy Session Note  Patient Details  Name: Jeffrey Acevedo MRN: [id number] Date of Birth: [birth year]  Today's Date: 03/03/2023 PT Individual Time: 0807-0905 PT Individual Time Calculation (min): 58 min   Short Term Goals: Week 1:  PT Short Term Goal 1 (Week 1): Pt will transfer sup <> sit w/ mod A consistently. PT Short Term Goal 1 - Progress (Week 1): Met PT Short Term Goal 2 (Week 1): Pt will transfer sit to stand w/ max A. PT Short Term Goal 2 - Progress (Week 1): Partly met PT Short Term Goal 3 (Week 1): Pt will maintain seated balance > 5' at EOB. PT Short Term Goal 3 - Progress (Week 1): Not met PT Short Term Goal 4 (Week 1): Pt will transfer SPT w/ max A +1. PT Short Term Goal 4 - Progress (Week 1): Not met PT Short Term Goal 5 (Week 1): PT to assess gait. PT Short Term Goal 5 - Progress (Week 1): Met Week 2:  PT Short Term Goal 1 (Week 2): Pt will perform sit<>stand transfers with overall Mod/ MaxA. PT Short Term Goal 2 (Week 2): Pt will maintain unsupported seated balance for more than 5 min with ModA. PT Short Term Goal 3 (Week 2): Pt will perform stand pivot transfers w/ Max A +1. PT Short Term Goal 4 (Week 2): Pt will ambulate at least 10 ft using LRAD with MaxA.   Skilled Therapeutic Interventions/Progress Updates:  Patient supine in bed on entrance to room. Patient is awake and has just received breakfast. Pt is alert and agreeable to PT session but would like to eat breakfast first.   Patient with no pain complaint at start of session.  Therapeutic Activity: Pt repositioned in bed and requires MaxA +2 to move up to [facility]. Bed positioned to most upright position in order to provide safe posture for eating breakfast. Pt guided in accordance with speech therapy orders for double swallow and for small bites. Requires MinA for positioning of spoon in L hand in order to more appropriately bring food to mouth. Requires MinA for drinking orange juice from container.  MaxA for setup of all.   Bed Mobility/ Transfers/ Neuromuscular re-ed: Once breakfast completed, pt guided in donning of pants in supine. Is able to lift each leg to thread foot into pant leg with vc only. Pt performed supine > sit with MinA to bring BLE off EOB and then MaxA to complete to square self on EOB and Mod A to push with LUE and bring UB to upright seated position. VC/ tc required for proper technique and sequencing throughout. Is unable to maintain upright seated position for more than . Has consistent push posteriorly and to the L. Requires MaxA to maintain while donning shoes with TotA.  Pt guided in sit<>stand using MaxA with minA +1 and +3 for safety as well as to don pt's pants. Is able to stand for 45 sec prior to melt back to seated position on EOB.   Pt effectively guided into forward lean with cue of bringing forehead to therapist's palm held in front of pt. Is able to utilize cues to perform once and then is not able to repeat after slow melt back posteriorly and to L. Requires increased level of assist to reach correct positioning - up to MaxA +2.   Pt requires MinA for UB and MaxA for BLE with cue to bring heels back to bed surface.  Then ModA to roll onto back for supine  positioning. With MinA +2 pt is able to follow instructions for pushing with BLE and slide toward HOB.   NMR performed for improvements in motor control and coordination, balance, sequencing, judgement, and self confidence/ efficacy in performing all aspects of mobility at highest level of independence.   Patient supine at end of session with brakes locked, no alarm set d/t bed malfunction, and all needs within reach. Telesitter in place.    Therapy Documentation Precautions:  Precautions Precautions: Fall, Other (comment) Precaution Comments: strong posterior and pusher Restrictions Weight Bearing Restrictions Per Provider Order: No Pain: Pain Assessment Pain Scale: 0-10 Pain Score: 0-No pain  related this session.    Therapy/Group: Individual Therapy  Loel Dubonnet PT, DPT, CSRS 03/03/2023, 12:32 PM

## 2023-03-03 NOTE — Plan of Care (Signed)
Problem: RH Balance Goal: LTG Patient will maintain dynamic sitting balance (PT) Description: LTG:  Patient will maintain dynamic sitting balance with assistance during mobility activities (PT) 03/03/2023 1613 by Loel Dubonnet, PT Reactivated 03/03/2023 1609 by Loel Dubonnet, PT Outcome: Not Applicable Flowsheets (Taken 03/03/2023 1609) LTG: Pt will maintain dynamic sitting balance during mobility activities with:: Minimal Assistance - Patient > 75% Goal: LTG Patient will maintain dynamic standing balance (PT) Description: LTG:  Patient will maintain dynamic standing balance with assistance during mobility activities (PT) 03/03/2023 1613 by Loel Dubonnet, PT Reactivated 03/03/2023 1609 by Loel Dubonnet, PT Outcome: Not Applicable Flowsheets (Taken 03/03/2023 1609) LTG: Pt will maintain dynamic standing balance during mobility activities with:: Maximal Assistance - Patient 25 - 49%   Problem: Sit to Stand Goal: LTG:  Patient will perform sit to stand with assistance level (PT) Description: LTG:  Patient will perform sit to stand with assistance level (PT) 03/03/2023 1613 by Loel Dubonnet, PT Reactivated 03/03/2023 1609 by Loel Dubonnet, PT Outcome: Not Applicable   Problem: RH Bed Mobility Goal: LTG Patient will perform bed mobility with assist (PT) Description: LTG: Patient will perform bed mobility with assistance, with/without cues (PT). 03/03/2023 1613 by Loel Dubonnet, PT Reactivated 03/03/2023 1609 by Loel Dubonnet, PT Outcome: Not Applicable   Problem: RH Bed to Chair Transfers Goal: LTG Patient will perform bed/chair transfers w/assist (PT) Description: LTG: Patient will perform bed to chair transfers with assistance (PT). 03/03/2023 1613 by Loel Dubonnet, PT Reactivated 03/03/2023 1609 by Loel Dubonnet, PT Outcome: Not Applicable   Problem: RH Car Transfers Goal: LTG Patient will perform car transfers with assist (PT) Description: LTG: Patient will  perform car transfers with assistance (PT). 03/03/2023 1613 by Loel Dubonnet, PT Reactivated 03/03/2023 1609 by Loel Dubonnet, PT Outcome: Not Applicable Flowsheets (Taken 03/03/2023 1609) LTG: Pt will perform car transfers with assist:: Maximal Assistance - Patient 25 - 49%   Problem: RH Furniture Transfers Goal: LTG Patient will perform furniture transfers w/assist (OT/PT) Description: LTG: Patient will perform furniture transfers  with assistance (OT/PT). 03/03/2023 1613 by Loel Dubonnet, PT Reactivated 03/03/2023 1609 by Loel Dubonnet, PT Outcome: Not Applicable Flowsheets (Taken 03/03/2023 1609) LTG: Pt will perform furniture transfers with assist:: Maximal Assistance - Patient 25 - 49%   Problem: RH Ambulation Goal: LTG Patient will ambulate in controlled environment (PT) Description: LTG: Patient will ambulate in a controlled environment, # of feet with assistance (PT). 03/03/2023 1613 by Loel Dubonnet, PT Reactivated 03/03/2023 1609 by Loel Dubonnet, PT Outcome: Not Applicable Flowsheets (Taken 03/03/2023 1609) LTG: Pt will ambulate in controlled environ  assist needed:: Maximal Assistance - Patient 25 - 49% LTG: Ambulation distance in controlled environment: 30 ft using LRAD Goal: LTG Patient will ambulate in home environment (PT) Description: LTG: Patient will ambulate in home environment, # of feet with assistance (PT). Outcome: Not Applicable   Problem: RH Wheelchair Mobility Goal: LTG Patient will propel w/c in controlled environment (PT) Description: LTG: Patient will propel wheelchair in controlled environment, # of feet with assist (PT) 03/03/2023 1613 by Loel Dubonnet, PT Reactivated 03/03/2023 1609 by Loel Dubonnet, PT Outcome: Not Applicable Flowsheets (Taken 03/03/2023 1609) LTG: Pt will propel w/c in controlled environ  assist needed:: Minimal Assistance - Patient > 75% LTG: Propel w/c distance in controlled environment: 100 ft Goal: LTG Patient  will propel w/c in home environment (PT) Description: LTG: Patient will propel wheelchair  in home environment, # of feet with assistance (PT). 03/03/2023 1613 by Loel Dubonnet, PT Reactivated 03/03/2023 1609 by Loel Dubonnet, PT Outcome: Not Applicable Flowsheets (Taken 03/03/2023 1609) LTG: Pt will propel w/c in home environ  assist needed:: Minimal Assistance - Patient > 75% LTG: Propel w/c distance in home environment: 50 ft   Problem: RH Stairs Goal: LTG Patient will ambulate up and down stairs w/assist (PT) Description: LTG: Patient will ambulate up and down # of stairs with assistance (PT) Outcome: Not Applicable

## 2023-03-04 DIAGNOSIS — Z7189 Other specified counseling: Secondary | ICD-10-CM | POA: Diagnosis not present

## 2023-03-04 DIAGNOSIS — I639 Cerebral infarction, unspecified: Secondary | ICD-10-CM | POA: Diagnosis not present

## 2023-03-04 DIAGNOSIS — Z515 Encounter for palliative care: Secondary | ICD-10-CM | POA: Diagnosis not present

## 2023-03-04 NOTE — Plan of Care (Signed)
  Problem: RH PAIN MANAGEMENT Goal: RH STG PAIN MANAGED AT OR BELOW PT'S PAIN GOAL Description: < 4 with prns Outcome: Progressing

## 2023-03-04 NOTE — Progress Notes (Signed)
PROGRESS NOTE   Subjective/Complaints:   Pt reports is "OK" Throat not sore this AM.  R shoulder "touchy".  Daughter wife/ to have a meeting- doesn't know what for.    ROS-limited by cognition/behavior.  Objective:   No results found.  No results for input(s): "WBC", "HGB", "HCT", "PLT" in the last 72 hours.  No results for input(s): "NA", "K", "CL", "CO2", "GLUCOSE", "BUN", "CREATININE", "CALCIUM" in the last 72 hours.   Intake/Output Summary (Last 24 hours) at 03/04/2023 1024 Last data filed at 03/04/2023 0830 Gross per 24 hour  Intake 480 ml  Output 200 ml  Net 280 ml     Pressure Injury 09/19/21 Coccyx Unstageable - Full thickness tissue loss in which the base of the injury is covered by slough (yellow, tan, gray, green or [place]) and/or eschar (tan, [place] or black) in the wound bed. oval shaped wound on coccyx with yello (Active)  09/19/21 1450  Location: Coccyx  Location Orientation:   Staging: Unstageable - Full thickness tissue loss in which the base of the injury is covered by slough (yellow, tan, gray, green or [place]) and/or eschar (tan, [place] or black) in the wound bed.  Wound Description (Comments): oval shaped wound on coccyx with yellow slough  Present on Admission: Yes (Originally listed as stage 1 09/19/21; labeled as unstageable 02/22/23 on admit to CIR)    Physical Exam: Vital Signs Blood pressure 135/77, pulse (!) 58, temperature 97.8 F (36.6 C), resp. rate 18, height 6' (1.829 m), weight 77 kg, SpO2 96%.    General: awake, alert, appropriate, sitting up watching TV; has telesitter; NAD HENT: conjugate gaze; oropharynx moist CV: regular  to borderline bradycardic rate; no JVD Pulmonary: CTA B/L; no W/R/R- good air movement GI: soft, NT, ND, (+)BS Psychiatric: appropriate- more interactive today Neurological: dysarthria and aphasia  Neurologic: Cranial nerves II through XII intact, motor  strength is 5/5 in Left and 4/5 RIght deltoid, bicep, tricep, grip, 5/5 bilateral hip flexor, knee extensors, ankle dorsiflexor and plantar flexor Oriented to person but not time   Musculoskeletal: no pain with AROM in all 4 ext  No joint swelling    Assessment/Plan: 1. Functional deficits which require 3+ hours per day of interdisciplinary therapy in a comprehensive inpatient rehab setting. Physiatrist is providing close team supervision and 24 hour management of active medical problems listed below. Physiatrist and rehab team continue to assess barriers to discharge/monitor patient progress toward functional and medical goals  Care Tool:  Bathing    Body parts bathed by patient: Chest, Abdomen, Face, Right arm   Body parts bathed by helper: Left arm, Front perineal area, Buttocks, Right upper leg, Left lower leg, Right lower leg, Left upper leg     Bathing assist Assist Level: Maximal Assistance - Patient 24 - 49%     Upper Body Dressing/Undressing Upper body dressing   What is the patient wearing?: Pull over shirt    Upper body assist Assist Level: Moderate Assistance - Patient 50 - 74%    Lower Body Dressing/Undressing Lower body dressing      What is the patient wearing?: Pants     Lower body assist Assist for lower  body dressing: Maximal Assistance - Patient 25 - 49%     Toileting Toileting    Toileting assist Assist for toileting: 2 Helpers     Transfers Chair/bed transfer  Transfers assist  Chair/bed transfer activity did not occur: Safety/medical concerns (unsafe to get up)  Chair/bed transfer assist level: Total Assistance - Patient < 25%     Locomotion Ambulation   Ambulation assist   Ambulation activity did not occur: Safety/medical concerns  Assist level: Total Assistance - Patient < 25% Assistive device: Other (comment) (3 musketeer with WC follow) Max distance: 8   Walk 10 feet activity   Assist  Walk 10 feet activity did not  occur: Safety/medical concerns        Walk 50 feet activity   Assist Walk 50 feet with 2 turns activity did not occur: Safety/medical concerns         Walk 150 feet activity   Assist Walk 150 feet activity did not occur: Safety/medical concerns         Walk 10 feet on uneven surface  activity   Assist Walk 10 feet on uneven surfaces activity did not occur: Safety/medical concerns         Wheelchair     Assist Is the patient using a wheelchair?: Yes Type of Wheelchair: Manual    Wheelchair assist level: Dependent - Patient 0%      Wheelchair 50 feet with 2 turns activity    Assist    Wheelchair 50 feet with 2 turns activity did not occur: Safety/medical concerns   Assist Level: Dependent - Patient 0%   Wheelchair 150 feet activity     Assist  Wheelchair 150 feet activity did not occur: Safety/medical concerns   Assist Level: Dependent - Patient 0%   Blood pressure 135/77, pulse (!) 58, temperature 97.8 F (36.6 C), resp. rate 18, height 6' (1.829 m), weight 77 kg, SpO2 96%.  Medical Problem List and Plan: 1. 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  Antithrombotics: -DVT/anticoagulation:  Pharmaceutical: Lovenox             -antiplatelet therapy: DAPT 3. Pain Management: Tylenol prn. Gabapentin 100 mg TID was added on 11/28   RIght shoulder pain  hemiparetic side , exam consistent with impingement syndrome but pt also states he fell on it during CVA onset - xray neg, will limit overhead activities with PT, OT  Add voltaren gel  1214- pain in R shoulder controlled- c/o sore throat- con't regimen- denies throat spray. 12/15- describes R shoulder as "touchy"- doesn't take much to "upset it"- no throat pain today  4. Mood/Behavior/Sleep:  LCSW to  follow for evaluation and support.              -antipsychotic agents: N/A--monitor for delirium/MS changes with recent stroke/change in environment insetting of baseline dementia. 5. Neuropsych/cognition: This patient may be intermittently capable of making decisions on his own behalf.             --family has noticed some disorientation and have been present 24/7 at [facility]. Unable to do that here             --  also HOH and wears bilateral hearing aids. 6. Skin/Wound Care:  Routine pressure relief pressure.   7. Fluids/Electrolytes/Nutrition: Monitor I/O. Check CMET in am             --Monitor I/O. Lasix was d/c yesterday for ???  8. CAD/Chronic diastolic CHF: Followed by Dr. Allyson Sabal. HH diet with strict I/O. Daily weights             --Most recent PCI 12/29/2022 followed by admissions for fluid overload Continue Jardiance , Lopressor , aldactone              --was on Lasix 40 mg BID PTA-->d/c on 12/04 for ??. Monitor for signs of overload  12/14- hasn't had weights checked for 3 days- will d/w nursing.  9. Chronic respiratory failure w/nocturnal hypoxia: Since Sept--sleep study negative for OSA             --currently oxygen dependent with activity and intermittently during hospitalization --Encourage pulmonary hygiene. Will check CXR to rule out overload.              --Oxygen prn during the day and continue at nights.    10. New diagnosis B-cell lymphoma: Developing multiple nodules for past 2 months- plan was for OP f/u with oncology in [place] [facility] to set up f/u with  Dr Melvyn Neth in [place]              --just received bx reports yesterday-->called derm for path report             --question coagulopathy as cause of stroke. Will check dopplers          Per onc Non Hodgkins Lymphoma + comorbid conditions qualify pt for in home hospice.  Per Onc consult palliative care consult recommended  but not ordered . Will order 11/13   12/14- Seen by palliative care-pt wants to hear from  outside oncology before deciding on DNR status/ vs full code.   11. CKD 3a: Baseline SCr 1.4-1.5 per records             --monitor weekly  12. Hx of anemia of chronic disease:Hgb stable as well as B12            --continue iron supplement    Latest Ref Rng & Units 03/01/2023    5:09 AM 02/26/2023    5:32 AM 02/23/2023    6:31 AM  CBC  WBC 4.0 - 10.5 K/uL 7.4  7.6  8.4   Hemoglobin 13.0 - 17.0 g/dL 10.2  72.5  36.6   Hematocrit 39.0 - 52.0 % 38.4  37.8  37.5   Platelets 150 - 400 K/uL 351  350  337     13. Hx of vitamin B 12 deficiency: Continue high doses of B12   14. Chronic left knee pain: Monitor for symptoms with increase in activity/Right hemipareis  15. Hx major depressive d/o: continue zoloft.   16. Hx prostate cancer- sounds like could be indolent:Has urgency/nocturia. Toilet every 4 hours. Continue to check PVRs  17. Hx prediabetes: Hgb A 1c- 5.9 few months ago. Monitor fasting BS with serial labs.   18. Sore throat  12/14- pt doesn't want throat spray- can add if needed.   12/15- sore throat gone this AM       LOS: 10 days A FACE TO FACE EVALUATION WAS PERFORMED  Madinah Quarry 03/04/2023, 10:24 AM

## 2023-03-04 NOTE — Plan of Care (Signed)
  Problem: Consults Goal: RH STROKE PATIENT EDUCATION Description: See Patient Education module for education specifics  Outcome: Progressing   Problem: RH BOWEL ELIMINATION Goal: RH STG MANAGE BOWEL WITH ASSISTANCE Description: STG Manage Bowel with toileting Assistance. Outcome: Progressing Goal: RH STG MANAGE BOWEL W/MEDICATION W/ASSISTANCE Description: STG Manage Bowel with Medication with mod I Assistance. Outcome: Progressing   Problem: RH BLADDER ELIMINATION Goal: RH STG MANAGE BLADDER WITH ASSISTANCE Description: STG Manage Bladder With toileting Assistance Outcome: Progressing   Problem: RH SKIN INTEGRITY Goal: RH STG SKIN FREE OF INFECTION/BREAKDOWN Description: Manage w min assist Outcome: Progressing Goal: RH STG MAINTAIN SKIN INTEGRITY WITH ASSISTANCE Description: STG Maintain Skin Integrity With min Assistance. Outcome: Progressing Goal: RH STG ABLE TO PERFORM INCISION/WOUND CARE W/ASSISTANCE Description: STG Able To Perform Incision/Wound Care With min Assistance. Outcome: Progressing   Problem: RH SAFETY Goal: RH STG ADHERE TO SAFETY PRECAUTIONS W/ASSISTANCE/DEVICE Description: STG Adhere to Safety Precautions With cues Assistance/Device. Outcome: Progressing   Problem: RH COGNITION-NURSING Goal: RH STG USES MEMORY AIDS/STRATEGIES W/ASSIST TO PROBLEM SOLVE Description: STG Uses Memory Aids/Strategies With cues Assistance to Problem Solve. Outcome: Progressing   Problem: RH PAIN MANAGEMENT Goal: RH STG PAIN MANAGED AT OR BELOW PT'S PAIN GOAL Description: <4 with prns Outcome: Progressing   Problem: RH KNOWLEDGE DEFICIT Goal: RH STG INCREASE KNOWLEDGE OF HYPERTENSION Description: Patient and family will be able to manage HTN with medications and dietary modifications using educational resources independently Outcome: Progressing Goal: RH STG INCREASE KNOWLEGDE OF HYPERLIPIDEMIA Description: Patient and family will be able to manage HLD with medications  and dietary modifications using educational resources independently Outcome: Progressing Goal: RH STG INCREASE KNOWLEDGE OF STROKE PROPHYLAXIS Description: Patient and family will be able to manage secondary risks with medications and dietary modifications using educational resources independently Outcome: Progressing

## 2023-03-04 NOTE — Progress Notes (Signed)
   Palliative Medicine Inpatient Follow Up Note HPI: Jeffrey Acevedo. Jeffrey Acevedo is a [name] with history of CAD, HTN, CKD 3a, chronic diastolic CHF, B-Cell Lymphoma, hearing loss, dementia, who was admitted to [facility] on 02/15/23 with reports of right sided weakness and inability hold fork or to feed himself.    The Palliative care team has been asked to support additional goals of care conversations.   Today's Discussion 03/04/2023  *Please note that this is a verbal dictation therefore any spelling or grammatical errors are due to the "Dragon [employer] One" system interpretation.  Chart reviewed inclusive of vital signs, progress notes, laboratory results, and diagnostic images.   I met with Jeffrey Acevedo at bedside this afternoon. We reviewed out conversation from the day prior. He remembered some, but not all of it. He and I discussed again his lymphoma. Created space and opportunity for patient to explore thoughts feelings and fears regarding current medical situation. He shares that he wants to talk to his wife and daughter about this more.  We discussed resuscitation status again. [name] expresses what a difficult decision this is for him. He states that he also desires time to talk to his family about this.   I shared that Gilberts family would be here tomorrow for training in regards to how to care for him when he discharges. We discussed that I would come back at that time for additional conversations.  Questions and concerns addressed/Palliative Support Provided.   Objective Assessment: Vital Signs Vitals:   03/03/23 1939 03/04/23 0621  BP: (!) 108/54 135/77  Pulse: 62 (!) 58  Resp: 18 18  Temp: 97.7 F (36.5 C) 97.8 F (36.6 C)  SpO2: 94% 96%    Intake/Output Summary (Last 24 hours) at 03/04/2023 1446 Last data filed at 03/04/2023 1245 Gross per 24 hour  Intake 477 ml  Output 200 ml  Net 277 ml   Last Weight  Most recent update: 02/28/2023  4:01 AM    Weight  77 kg (169  lb 12.1 oz)            Gen: Elderly Caucasian male chronically ill-appearing HEENT: moist mucous membranes CV: Regular rate and rhythm  PULM: On room air breathing is even and nonlabored ABD: soft/nontender  EXT: No edema  Neuro: Alert and oriented x2 --> hard of hearing  SUMMARY OF RECOMMENDATIONS   Full code/full scope of care --> patient's family plan to discuss CODE STATUS in greater detail amongst themselves    Appreciate TOC helping with DME   Outpatient palliative support with hospice of [facility]   Ongoing PMT support  Time Spent: 25 ______________________________________________________________________________________ Lamarr Lulas [place] Palliative Medicine Team Team Cell Phone: [phone] Please utilize secure chat with additional questions, if there is no response within 30 minutes please call the above phone number  Palliative Medicine Team providers are available by phone from 7am to 7pm daily and can be reached through the team cell phone.  Should this patient require assistance outside of these hours, please call the patient's attending physician.

## 2023-03-05 DIAGNOSIS — F01B4 Vascular dementia, moderate, with anxiety: Secondary | ICD-10-CM | POA: Diagnosis not present

## 2023-03-05 DIAGNOSIS — I509 Heart failure, unspecified: Secondary | ICD-10-CM | POA: Diagnosis not present

## 2023-03-05 DIAGNOSIS — I639 Cerebral infarction, unspecified: Secondary | ICD-10-CM | POA: Diagnosis not present

## 2023-03-05 DIAGNOSIS — Z515 Encounter for palliative care: Secondary | ICD-10-CM | POA: Diagnosis not present

## 2023-03-05 DIAGNOSIS — Z7189 Other specified counseling: Secondary | ICD-10-CM | POA: Diagnosis not present

## 2023-03-05 DIAGNOSIS — C8338 Diffuse large B-cell lymphoma, lymph nodes of multiple sites: Secondary | ICD-10-CM | POA: Diagnosis not present

## 2023-03-05 LAB — CBC
HCT: 38.8 % — ABNORMAL LOW (ref 39.0–52.0)
Hemoglobin: 12.3 g/dL — ABNORMAL LOW (ref 13.0–17.0)
MCH: 27.1 pg (ref 26.0–34.0)
MCHC: 31.7 g/dL (ref 30.0–36.0)
MCV: 85.5 fL (ref 80.0–100.0)
Platelets: 383 10*3/uL (ref 150–400)
RBC: 4.54 MIL/uL (ref 4.22–5.81)
RDW: 15.7 % — ABNORMAL HIGH (ref 11.5–15.5)
WBC: 8.2 10*3/uL (ref 4.0–10.5)
nRBC: 0 % (ref 0.0–0.2)

## 2023-03-05 LAB — BASIC METABOLIC PANEL
Anion gap: 8 (ref 5–15)
BUN: 20 mg/dL (ref 8–23)
CO2: 25 mmol/L (ref 22–32)
Calcium: 9.7 mg/dL (ref 8.9–10.3)
Chloride: 103 mmol/L (ref 98–111)
Creatinine, Ser: 1.07 mg/dL (ref 0.61–1.24)
GFR, Estimated: >60 mL/min (ref >60)
Glucose, Bld: 92 mg/dL (ref 70–99)
Potassium: 3.7 mmol/L (ref 3.5–5.1)
Sodium: 136 mmol/L (ref 135–145)

## 2023-03-05 NOTE — Plan of Care (Signed)
  Problem: Consults Goal: RH STROKE PATIENT EDUCATION Description: See Patient Education module for education specifics  Outcome: Progressing   Problem: RH BLADDER ELIMINATION Goal: RH STG MANAGE BLADDER WITH ASSISTANCE Description: STG Manage Bladder With toileting Assistance Outcome: Progressing   Problem: RH SKIN INTEGRITY Goal: RH STG SKIN FREE OF INFECTION/BREAKDOWN Description: Manage w min assist Outcome: Progressing Goal: RH STG MAINTAIN SKIN INTEGRITY WITH ASSISTANCE Description: STG Maintain Skin Integrity With min Assistance. Outcome: Progressing Goal: RH STG ABLE TO PERFORM INCISION/WOUND CARE W/ASSISTANCE Description: STG Able To Perform Incision/Wound Care With min Assistance. Outcome: Progressing   Problem: RH SAFETY Goal: RH STG ADHERE TO SAFETY PRECAUTIONS W/ASSISTANCE/DEVICE Description: STG Adhere to Safety Precautions With cues Assistance/Device. Outcome: Progressing   Problem: RH COGNITION-NURSING Goal: RH STG USES MEMORY AIDS/STRATEGIES W/ASSIST TO PROBLEM SOLVE Description: STG Uses Memory Aids/Strategies With cues Assistance to Problem Solve. Outcome: Progressing

## 2023-03-05 NOTE — Discharge Summary (Incomplete)
Physician Discharge Summary  Patient ID: Jeffrey Acevedo MRN: [id number] DOB/AGE: [birth year] 82 y.o.  Admit date: 02/22/2023 Discharge date: 03/09/2023  Discharge Diagnoses:  Principal Problem:   Stroke (cerebrum) [facility]) Active Problems:   Essential hypertension   Primary osteoarthritis of right hip   Acute kidney injury superimposed on chronic kidney disease (HCC)   Chronic anemia   Non-Hodgkin lymphoma of skin (HCC)   Discharged Condition: stable  Significant Diagnostic Studies: DG Swallowing Func-Speech Pathology Result Date: 02/28/2023 Table formatting from the original result was not included. Modified Barium Swallow Study Patient Details Name: Jeffrey Acevedo MRN: [id number] Date of Birth: 09-20-1940 Today's Date: 02/28/2023 HPI/PMH: HPI: Jeffrey Acevedo is a [name] with history of CAD, HTN, CKD 3a, chronic diastolic CHF, hearing loss, dementia, who was admitted to [facility] on 02/15/23 with reports of right sided weakness and inability hold fork or to feed himself. He was found to have acute on chronic renal failure with SCr 106,   MRI brain done revealing  acute-subacute left temporal as well as left parietal and right parietal lobes. Clinical Impression: Patient presents with mild oral dysphagia with functional pharyngeal phase. Oral phase is characterized by reduced lingual control/coordination and poor bolus prep/mastication resulting in occasional lingual pumping, oral residuals and posterior spill to pyriform sinuses. Pharyngeal phase is functional for patients age/medical status, with tracely reduced pharyngeal stripping wave, though remarkable for extremely trace transient penetration of thin liquids x1 and vallecular residuals. No other penetration/aspiration observed throughout consistencies. Vallecular residue increased post-prandially due to additional oral residue spillage. Vallecular residuals effectively cleared through second cued dry swallow. Recommend continuation  of D3/thin liquid diet with second dry swallow after all consistencies. Patient with consumption of pill in puree during study, though chewed rather than swallowed whole despite instruction. Continue medications crushed in puree with upcoming trials of whole in puree w/ use of compensatory strategies. Patient should continue to follow standardized swallowing precautions including small bites/sips, slow rate, and sitting upright at 90 degrees. Factors that may increase risk of adverse event in presence of aspiration [name] & [name] 2021): Factors that may increase risk of adverse event in presence of aspiration [name] & [name] 2021): Reduced cognitive function Recommendations/Plan: Swallowing Evaluation Recommendations Swallowing Evaluation Recommendations Recommendations: PO diet PO Diet Recommendation: Dysphagia 3 (Mechanical soft); Thin liquids (Level 0) Liquid Administration via: Cup; Straw Medication Administration: Crushed with puree Supervision: Full supervision/cueing for swallowing strategies Swallowing strategies  : Minimize environmental distractions; Slow rate; Small bites/sips; Multiple dry swallows after each bite/sip Postural changes: Position pt fully upright for meals Oral care recommendations: Oral care BID (2x/day) Treatment Plan Treatment Plan Treatment recommendations: Therapy as outlined in treatment plan below Follow-up recommendations: Home health SLP Recommendations Comment: n/a Functional status assessment: Patient has had a recent decline in their functional status and/or demonstrates limited ability to make significant improvements in function in a reasonable and predictable amount of time. Treatment frequency: Min 2x/week Treatment duration: 3 weeks Interventions: Aspiration precaution training; Compensatory techniques; Patient/family education; Diet toleration management by SLP Recommendations Recommendations for follow up therapy are one component of a multi-disciplinary discharge  planning process, led by the attending physician.  Recommendations may be updated based on patient status, additional functional criteria and insurance authorization. Assessment: Orofacial Exam: Orofacial Exam Oral Cavity: Oral Hygiene: WFL Oral Cavity - Dentition: Adequate natural dentition Orofacial Anatomy: WFL Anatomy: Anatomy: WFL Boluses Administered: Boluses Administered Boluses Administered: Thin liquids (Level 0); Mildly thick liquids (Level 2, nectar thick);  Puree; Solid  Oral Impairment Domain: Oral Impairment Domain Lip Closure: No labial escape Tongue control during bolus hold: Posterior escape of less than half of bolus Bolus preparation/mastication: Disorganized chewing/mashing with solid pieces of bolus unchewed Bolus transport/lingual motion: Repetitive/disorganized tongue motion Oral residue: Residue collection on oral structures Location of oral residue : Palate; Tongue Initiation of pharyngeal swallow : Pyriform sinuses  Pharyngeal Impairment Domain: Pharyngeal Impairment Domain Soft palate elevation: No bolus between soft palate (SP)/pharyngeal wall (PW) Laryngeal elevation: Complete superior movement of thyroid cartilage with complete approximation of arytenoids to epiglottic petiole Anterior hyoid excursion: Complete anterior movement Epiglottic movement: Complete inversion Laryngeal vestibule closure: Complete, no air/contrast in laryngeal vestibule Pharyngeal stripping wave : Present - diminished Pharyngeal contraction (A/P view only): N/A Pharyngoesophageal segment opening: Complete distension and complete duration, no obstruction of flow Tongue base retraction: Trace column of contrast or air between tongue base and PPW Pharyngeal residue: Collection of residue within or on pharyngeal structures Location of pharyngeal residue: Valleculae  Esophageal Impairment Domain: Esophageal Impairment Domain Esophageal clearance upright position: Complete clearance, esophageal coating Pill: Pill  Consistency administered: Puree Puree: WFL Penetration/Aspiration Scale Score: Penetration/Aspiration Scale Score 1.  Material does not enter airway: Mildly thick liquids (Level 2, nectar thick); Puree; Solid; Pill 2.  Material enters airway, remains ABOVE vocal cords then ejected out: Thin liquids (Level 0) Compensatory Strategies: Compensatory Strategies Compensatory strategies: Yes Multiple swallows: Effective Effective Multiple Swallows: Thin liquid (Level 0); Moderately thick liquid (Level 3, honey thick); Puree; Solid; Pill   General Information: No data recorded Diet Prior to this Study: Thin liquids (Level 0); Dysphagia 3 (mechanical soft)   No data recorded  Respiratory Status: WFL   Supplemental O2: None (Room air)   No data recorded Behavior/Cognition: Cooperative; Confused; Requires cueing Self-Feeding Abilities: Needs assist with self-feeding; Dependent for feeding (depends on physical need at the time) Baseline vocal quality/speech: Hypophonia/low volume; Abnormal resonance Volitional Cough: Able to elicit Volitional Swallow: Able to elicit Exam Limitations: No limitations Goal Planning: Prognosis for improved oropharyngeal function: Fair Barriers to Reach Goals: Cognitive deficits Barriers/Prognosis Comment: dementia Patient/Family Stated Goal: n/a Consulted and agree with results and recommendations: Patient Pain: No data recorded End of Session: Start Time:No data recorded Stop Time: No data recorded Time Calculation:No data recorded Charges: No data recorded SLP visit diagnosis: SLP Visit Diagnosis: Dysphagia, oropharyngeal phase (R13.12) Past Medical History: Past Medical History: Diagnosis Date  Acute on chronic diastolic CHF (congestive heart failure) (HCC) 01/03/2023  Cancer (HCC)   prostate cancer surgery 2011  Chronic diastolic heart failure (HCC)   Coronary artery disease   sees Dr. Erlene Quan 3 mths ago.  Stress test 03/31/2015 in epic  Degenerative joint disease   Right hip  Depression    Hearing loss of both ears   only at present wears the right ear hearing aid  Hyperlipidemia   Hypertension   Prostate cancer [facility])  Past Surgical History: Past Surgical History: Procedure Laterality Date  CARDIAC CATHETERIZATION  06/17/2009  Proximal LAD 90% stenosis just beyond previously stented segment with a Multi-Link Vision bare-metal stent  CORONARY ANGIOPLASTY    stents placed in 1998 and 2011   CORONARY PRESSURE/FFR STUDY N/A 05/02/2021  Procedure: INTRAVASCULAR PRESSURE WIRE/FFR STUDY;  Surgeon: Runell Gess, MD;  Location: [facility];  Service: Cardiovascular;  Laterality: N/A;  CORONARY PRESSURE/FFR STUDY N/A 09/19/2021  Procedure: INTRAVASCULAR PRESSURE WIRE/FFR STUDY;  Surgeon: Runell Gess, MD;  Location: [facility];  Service: Cardiovascular;  Laterality: N/A;  RAMUS  CORONARY STENT INTERVENTION N/A 05/02/2021  Procedure: CORONARY STENT INTERVENTION;  Surgeon: Runell Gess, MD;  Location: [facility];  Service: Cardiovascular;  Laterality: N/A;  CORONARY STENT INTERVENTION N/A 09/19/2021  Procedure: CORONARY STENT INTERVENTION;  Surgeon: Runell Gess, MD;  Location: [facility];  Service: Cardiovascular;  Laterality: N/A;  CORONARY STENT INTERVENTION N/A 12/29/2022  Procedure: CORONARY STENT INTERVENTION;  Surgeon: Kathleene Hazel, MD;  Location: [facility];  Service: Cardiovascular;  Laterality: N/A;  LEFT HEART CATH AND CORONARY ANGIOGRAPHY N/A 05/02/2021  Procedure: LEFT HEART CATH AND CORONARY ANGIOGRAPHY;  Surgeon: Runell Gess, MD;  [facility]: [facility];  Service: Cardiovascular;  Laterality: N/A;  LEFT HEART CATH AND CORONARY ANGIOGRAPHY N/A 09/19/2021  Procedure: LEFT HEART CATH AND CORONARY ANGIOGRAPHY;  Surgeon: Runell Gess, MD;  [facility]: [facility];  Service: Cardiovascular;  Laterality: N/A;  NASAL SEPTUM SURGERY    Dr. Richardson Landry @ [facility]  2014  [facility]  2011  RIGHT/LEFT HEART CATH AND  CORONARY ANGIOGRAPHY N/A 12/29/2022  Procedure: RIGHT/LEFT HEART CATH AND CORONARY ANGIOGRAPHY;  Surgeon: Kathleene Hazel, MD;  [facility]: [facility];  Service: Cardiovascular;  Laterality: N/A;  ROBOT ASSISTED LAPAROSCOPIC RADICAL PROSTATECTOMY    TOTAL HIP ARTHROPLASTY Right 04/19/2015  Procedure: RIGHT TOTAL HIP ARTHROPLASTY ANTERIOR APPROACH;  Surgeon: Jodi Geralds, MD;  Location: [facility] OR;  Service: Orthopedics;  Laterality: Right; Cassidi F Sockwell 02/28/2023, 3:36 PM  DG Wrist Complete Right Result Date: 02/27/2023 CLINICAL DATA:  Right upper extremity pain. EXAM: RIGHT WRIST - COMPLETE 3+ VIEW; RIGHT FOREARM - 2 VIEW COMPARISON:  None Available. FINDINGS: There is no acute fracture or dislocation. The bones are osteopenic. There is arthritic changes of the wrist with severe narrowing of the radiocarpal distance. The soft tissues are unremarkable. IMPRESSION: 1. No acute fracture or dislocation. 2. 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 Electronically Signed   By: Elgie Collard M.D.   On: 02/27/2023 18:18   DG Forearm Right Result Date: 02/27/2023 CLINICAL DATA:  Right upper extremity pain. EXAM: RIGHT WRIST - COMPLETE 3+ VIEW; RIGHT FOREARM - 2 VIEW COMPARISON:  None Available. FINDINGS: There is no acute fracture or dislocation. The bones are osteopenic. There is arthritic changes of the wrist with severe narrowing of the radiocarpal distance. The soft tissues are unremarkable. IMPRESSION: 1. No acute fracture or dislocation. 2. 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 Electronically Signed   By: Elgie Collard M.D.   On: 02/27/2023 18:18   VAS [place] LOWER EXTREMITY VENOUS (DVT) Result Date: 02/23/2023  Lower Venous DVT Study Patient Name:  ANDREJ CROFTS  Date of Exam:   02/23/2023 Medical Rec #: [id number]        Accession #:    [id number] Date of Birth: May 17, 1940        Patient Gender: M Patient Age:   57 years Exam Location:  [facility] Procedure:      VAS  [place] LOWER EXTREMITY VENOUS (DVT) Referring Phys: Yasuko Lapage --------------------------------------------------------------------------------  Indications: Stroke. Other Indications: CHF. Comparison Study: Left lower extremity venous duplex on 10.16.2024. Performing Technologist: Fernande Bras  Examination Guidelines: A complete evaluation includes B-mode imaging, spectral Doppler, color Doppler, and power Doppler as needed of all accessible portions of each vessel. Bilateral testing is considered an integral part of a complete examination. Limited examinations for reoccurring indications may be performed as noted.  The reflux portion of the exam is performed with the patient in reverse Trendelenburg.  +---------+---------------+---------+-----------+----------+--------------+ RIGHT    CompressibilityPhasicitySpontaneityPropertiesThrombus Aging +---------+---------------+---------+-----------+----------+--------------+ CFV      Full           Yes      Yes                                 +---------+---------------+---------+-----------+----------+--------------+ SFJ      Full                                                        +---------+---------------+---------+-----------+----------+--------------+ FV Prox  Full                                                        +---------+---------------+---------+-----------+----------+--------------+ FV Mid   Full                                                        +---------+---------------+---------+-----------+----------+--------------+ FV DistalFull                                                        +---------+---------------+---------+-----------+----------+--------------+ PFV      Full                                                        +---------+---------------+---------+-----------+----------+--------------+ POP      Full           Yes      Yes                                  +---------+---------------+---------+-----------+----------+--------------+ PTV      Full                                                        +---------+---------------+---------+-----------+----------+--------------+ PERO     Full                                                        +---------+---------------+---------+-----------+----------+--------------+ Fluid collection noted in distal thigh measuring 2 x 1 x 2.1 cm and in superior calf measuing 2.7 x2.4 x 3.3 cm with vascularity.  +---------+---------------+---------+-----------+----------+--------------+ LEFT  CompressibilityPhasicitySpontaneityPropertiesThrombus Aging +---------+---------------+---------+-----------+----------+--------------+ CFV      Full           Yes      Yes                                 +---------+---------------+---------+-----------+----------+--------------+ SFJ      Full                                                        +---------+---------------+---------+-----------+----------+--------------+ FV Prox  Full                                                        +---------+---------------+---------+-----------+----------+--------------+ FV Mid   Full                                                        +---------+---------------+---------+-----------+----------+--------------+ FV DistalFull                                                        +---------+---------------+---------+-----------+----------+--------------+ PFV      Full                                                        +---------+---------------+---------+-----------+----------+--------------+ POP      Full           Yes      Yes                                 +---------+---------------+---------+-----------+----------+--------------+ PTV      Full                                                         +---------+---------------+---------+-----------+----------+--------------+ PERO     Full                                                        +---------+---------------+---------+-----------+----------+--------------+     Summary: BILATERAL: - No evidence of deep vein thrombosis seen in the lower extremities, bilaterally. -No evidence of popliteal cyst, bilaterally.   *See table(s) above for measurements and observations. Electronically signed by Carolynn Sayers on 02/23/2023 at 4:54:16 PM.    Final    DG  Shoulder Right Result Date: 02/22/2023 CLINICAL DATA:  Patient unable to move right arm.  Pain. EXAM: RIGHT SHOULDER - 2+ VIEW COMPARISON:  None Available. FINDINGS: There is no acute fracture or dislocation. The bones are osteopenic. Faint density along the inferior bony glenoid, chronic and likely degenerative. There is mild degenerative changes of the right AC joint. The soft tissues are unremarkable. IMPRESSION: 1. No acute fracture or dislocation. 2. Osteopenia and mild degenerative changes. Electronically Signed   By: Elgie Collard M.D.   On: 02/22/2023 18:52   DG Wrist Complete Right Result Date: 02/22/2023 CLINICAL DATA:  Pain with decreased range of motion. EXAM: RIGHT WRIST - COMPLETE 3+ VIEW COMPARISON:  None Available. FINDINGS: There are degenerative change scratch set there is degenerative changes involving the radiocarpal joint. Mild increase caliber of the scapholunate interval which measures 5 mm. No signs of acute fracture or dislocation. IMPRESSION: 1. No acute findings. 2. Degenerative changes involving the radiocarpal joint. 3. Mild increase caliber of the scapholunate interval which may reflect scapholunate ligament injury. Electronically Signed   By: Signa Kell M.D.   On: 02/22/2023 18:51   DG Chest 2 View Result Date: 02/22/2023 CLINICAL DATA:  Hypoxia EXAM: CHEST - 2 VIEW COMPARISON:  Chest x-ray 01/12/2023 FINDINGS: There are trace bilateral pleural effusions. There  is no focal lung infiltrate or pneumothorax. The cardiomediastinal silhouette is within normal limits. No acute fractures are seen. IMPRESSION: Trace bilateral pleural effusions. Electronically Signed   By: Darliss Cheney M.D.   On: 02/22/2023 18:51   DG Abd 1 View Result Date: 02/22/2023 CLINICAL DATA:  Bowel incontinence EXAM: ABDOMEN - 1 VIEW COMPARISON:  None Available. FINDINGS: Bowel gas pattern appears nonobstructed. No dilated loops of large or small bowel identified. No abnormal abdominal or pelvic calcifications identified. Previous right hip arthroplasty. IMPRESSION: Nonobstructive bowel gas pattern. Electronically Signed   By: Signa Kell M.D.   On: 02/22/2023 18:50    Labs:  Basic Metabolic Panel:    Latest Ref Rng & Units 03/12/2023    2:43 PM 03/08/2023    5:19 AM 03/05/2023    5:15 AM  BMP  Glucose 70 - 99 mg/dL 657  91  92   BUN 8 - 23 mg/dL 20  19  20    Creatinine 0.61 - 1.24 mg/dL 8.46  9.62  9.52   Sodium 135 - 145 mmol/L 138  135  136   Potassium 3.5 - 5.1 mmol/L 4.3  3.7  3.7   Chloride 98 - 111 mmol/L 101  103  103   CO2 22 - 32 mmol/L 27  25  25    Calcium 8.9 - 10.3 mg/dL 84.1  9.3  9.7      CBC:    Latest Ref Rng & Units 03/12/2023    2:43 PM 03/08/2023    5:19 AM 03/05/2023    5:15 AM  CBC  WBC 4.0 - 10.5 K/uL 9.8  9.0  8.2   Hemoglobin 13.0 - 17.0 g/dL 32.4  40.1  02.7   Hematocrit 39.0 - 52.0 % 41.0  37.0  38.8   Platelets 150 - 400 K/uL 363  349  383      CBG: No results for input(s): "GLUCAP" in the last 168 hours.  Brief HPI:   VUONG MCNULTY is a 82 y.o. male with history of CAD, HTN, CKD 3A, chronic diastolic CHF--oxygen dependent, hearing loss, dementia was admitted to end of hospital on 02/07/2023 with right-sided weakness and inability to  hold for for feed himself he was found to have acute/subacute left temporal as well as left parietal and right parietal lobe infarct.  Teleneurology recommended loop recorder to rule out A-fib as cause  of bilateral strokes and to continue home DAPT.  Hospital course significant for  acute on chronic renal failure as well as limitations due to right wrist pain with edema.  Therapy was working with patient who was noted to have delaying proximal right lean, right-sided weakness as well as right wrist and knee pain affecting overall mobility.  He was independent with walker prior to admission.  CIR was recommended due to functional decline.   Hospital Course: KOLDEN BERNERT was admitted to rehab 02/22/2023 for inpatient therapies to consist of PT, ST and OT at least three hours five days a week. Past admission physiatrist, therapy team and rehab RN have worked together to provide customized collaborative inpatient rehab.  He was noted to have bouts of confusion due to delirium which has resolved.  Air mattress was ordered for pressure-relief measures due to report of buttock pain and stage II area of breakdown.  This was treated with manuka honey with damp to dry dressing changes and had resolved by discharge.  BLE dopplers done were negative for DVT.  He was maintained on DAPT during his stay and is tolerating this without any side effects.  Serial check of CBC shows H&H and platelets to be relatively stable.  Dysesthesias right upper extremity has been managed with use of gabapentin 3 times daily.  X-rays of right shoulder and right wrist done due to significant pain with range of motion and were negative for injury or fracture.  Right wrist pain has improved with use of wrist brace during the day and resting hand splint at night.  Chronic knee pain has been managed with use of Voltaren gel for local measures.  His blood pressures were monitored on TID basis and has been stable.  He was maintained on heart healthy diet with daily weights which has been stable overall.  No signs of fluid overload load noted.  Chest x-ray done at admission was negative for signs of overload.  KUB also done due to abdominal  distention and was negative for obstipation.  His respiratory status has improved and he was weaned off oxygen during his stay.  Oncology was consulted for input on new diagnosis of B-cell lymphoma and concerns of stroke due to coagulopathy from CA.  Patient with significant comorbidities and Dr. Arbutus Ped felt that it was unlikely that he would benefit from any treatment during hospitalization.    He has appointment for follow-up with oncology in [place] and to discuss benefits of treatment pending improvement of his general condition.  Palliative care was also consulted to discuss goals of care with outpatient palliative support versus hospice.  Family plans on following up with oncology in  for input prior to making final decision on GOC.  Patient has made gains during his stay and requires max assist at wheelchair level.  Will continue to receive follow-up home health PT, OT and ST by   Rehab course: During patient's stay in rehab weekly team conferences were held to monitor patient's progress, set goals and discuss barriers to discharge. At admission, patient required total assist with basic ADL tasks and with mobility. He exhibited cognitive deficits with impairments in memory, orientation, awareness, basic problem-solving and attention.  He also exhibited language of confusion requiring cues to attend to task. He  has had  improvement in activity tolerance, balance, postural control as well as ability to compensate for deficits.   He has had improvement in functional use RUE  and RLE as well as improvement in awareness.  He requires mod assist for bathing, mod assist for upper body ADLs and max assist for lower body ADLs. He requires min to max assist for transfers and is able to ambulate 50 feet 3 musketeer style with max assist. He requires mod assist to max assist for cognitive tasks depending on waxing and waning of his cognitive status.  He requires min cues to utilize swallow strategies.   His speech intelligibility has improved and he requires min assist for utilization of speech strategies.  Family education has been completed with multiple family members.   Wound care: 1.  Desitin or vitamin D cream to buttocks daily.  Boost every 20 to 30 minutes when in chair.     Discharge disposition: 06-Home-Health Care Svc  Diet: Heart healthy.   Special Instructions: Boost every 20-30 minutes while in chair.  Offer protein supplements BID  Discharge Instructions     Ambulatory referral to Neurology   Complete by: As directed    An appointment is requested in approximately: 6 weeks. Stroke follow up   Ambulatory referral to Physical Medicine Rehab   Complete by: As directed    Hospital follow up      Allergies as of 03/09/2023       Reactions   Adhesive [tape] Other (See Comments)   Mainly just redness.   Has been ruled out for latex allegery   Hydrochlorothiazide Other (See Comments)   H/o Pancreatitis, takes in Avalide at home   Latex Other (See Comments)   unknown   Vantin [cefpodoxime] Rash        Medication List     STOP taking these medications    Fenofibric Acid 45 MG Cpdr   FeroSul 325 (65 Fe) MG tablet Generic drug: ferrous sulfate   Fish Oil 1000 MG Caps   furosemide 40 MG tablet Commonly known as: LASIX   MAGNESIUM CITRATE PO       TAKE these medications    aspirin EC 81 MG tablet Take 1 tablet (81 mg total) by mouth daily. Swallow whole.   atorvastatin 40 MG tablet Commonly known as: LIPITOR Take 1 tablet (40 mg total) by mouth daily after supper. What changed: when to take this   CertaVite/Antioxidants Tabs Take 1 tablet by mouth daily.   clopidogrel 75 MG tablet Commonly known as: PLAVIX Take 1 tablet (75 mg total) by mouth daily.   Deep Sea Nasal Spray 0.65 % nasal spray Generic drug: sodium chloride Place 1 spray into both nostrils 2 (two) times daily before lunch and supper.   diclofenac Sodium 1 % Gel Commonly  known as: VOLTAREN Apply 2 g topically to right shoulder 4 (four) times daily   Fenofibrate 40 MG Tabs Take 2 tablets (80 mg total) by mouth at bedtime.   Ferrex 150 150 MG capsule Generic drug: iron polysaccharides Take 1 capsule (150 mg total) by mouth daily with lunch.   FIBER PO Take 1 tablet by mouth daily as needed (Constipation).   fluticasone 50 MCG/ACT nasal spray Commonly known as: FLONASE Place 1 spray into both nostrils daily.   Jardiance 10 MG Tabs tablet Generic drug: empagliflozin Take 1 tablet (10 mg total) by mouth daily.   latanoprost 0.005 % ophthalmic solution Commonly known as: XALATAN Place 1 drop into the left eye  at bedtime.   leptospermum manuka honey Pste paste Apply 1 Application topically daily. Apply a thin layer to yellow slough on buttocks and cover with dry dressing. Change dressing daily and more frequently if soiled   metoprolol tartrate 25 MG tablet Commonly known as: LOPRESSOR Take 1 tablet (25 mg total) by mouth daily.   nitroGLYCERIN 0.4 MG SL tablet Commonly known as: Nitrostat Place 1 tablet (0.4 mg total) under the tongue every 5 (five) minutes as needed for chest pain.   polyethylene glycol powder 17 GM/SCOOP powder Commonly known as: GLYCOLAX/MIRALAX Take 17 g by mouth daily. What changed:  when to take this reasons to take this   sertraline 25 MG tablet Commonly known as: ZOLOFT Take 1 tablet (25 mg total) by mouth daily.   spironolactone 25 MG tablet Commonly known as: ALDACTONE Take 0.5 tablets (12.5 mg total) by mouth daily. What changed: Another medication with the same name was removed. Continue taking this medication, and follow the directions you see here.   triamcinolone cream 0.1 % Commonly known as: KENALOG Apply topically to the affected area 2 (two) times daily for 2 to 3 weeks What changed:  how much to take when to take this reasons to take this   VITAMIN B-12 PO Take 2,500 mcg by mouth every  morning.        Follow-up Information     [name], MD Follow up.   Specialty: Internal Medicine Why: Call in 1-2 days for post hospital follow up Contact information: [address] [place] [address] [phone]         Kirsteins, Victorino Sparrow, MD Follow up.   Specialty: Physical Medicine and Rehabilitation Why: office will call you with follow up appointment Contact information: [address] [place] [address] [phone]         [facility] Follow up.   Why: Call in 1-2 days for post hospital follow up Contact information: [address]     [address] [place] [address] [phone]        Marliss Czar, NP Follow up.   Specialty: Nurse Practitioner Contact information: [address] [place] [address] [phone]                 Signed: Jacquelynn Cree 03/12/2023, 6:23 PM

## 2023-03-05 NOTE — Progress Notes (Signed)
PROGRESS NOTE   Subjective/Complaints:  Pt without shoulder pain, working with OT, appreciate palliative note    ROS-limited by cognition/behavior.  Objective:   No results found.  Recent Labs    03/05/23 0515  WBC 8.2  HGB 12.3*  HCT 38.8*  PLT 383    Recent Labs    03/05/23 0515  NA 136  K 3.7  CL 103  CO2 25  GLUCOSE 92  BUN 20  CREATININE 1.07  CALCIUM 9.7     Intake/Output Summary (Last 24 hours) at 03/05/2023 0757 Last data filed at 03/05/2023 0113 Gross per 24 hour  Intake 717 ml  Output 200 ml  Net 517 ml     Pressure Injury 09/19/21 Coccyx Unstageable - Full thickness tissue loss in which the base of the injury is covered by slough (yellow, tan, gray, green or [place]) and/or eschar (tan, Hammad or black) in the wound bed. oval shaped wound on coccyx with yello (Active)  09/19/21 1450  Location: Coccyx  Location Orientation:   Staging: Unstageable - Full thickness tissue loss in which the base of the injury is covered by slough (yellow, tan, gray, green or [place]) and/or eschar (tan, [place] or black) in the wound bed.  Wound Description (Comments): oval shaped wound on coccyx with yellow slough  Present on Admission: Yes (Originally listed as stage 1 09/19/21; labeled as unstageable 02/22/23 on admit to CIR)    Physical Exam: Vital Signs Blood pressure 128/72, pulse (!) 54, temperature 97.7 F (36.5 C), resp. rate 16, height 6' (1.829 m), weight 77 kg, SpO2 99%.    General: awake, alert, appropriate, sitting up watching TV; has telesitter; NAD HENT: conjugate gaze; oropharynx moist CV: regular  to borderline bradycardic rate; no JVD Pulmonary: CTA B/L; no W/R/R- good air movement GI: soft, NT, ND, (+)BS Psychiatric: appropriate- more interactive today Neurological: dysarthria and aphasia  Neurologic: Cranial nerves II through XII intact, motor strength is 5/5 in Left and 4/5 RIght deltoid,  bicep, tricep, grip, 5/5 bilateral hip flexor, knee extensors, ankle dorsiflexor and plantar flexor Oriented to person but not time   Musculoskeletal: no pain with AROM in all 4 ext  No joint swelling    Assessment/Plan: 1. Functional deficits which require 3+ hours per day of interdisciplinary therapy in a comprehensive inpatient rehab setting. Physiatrist is providing close team supervision and 24 hour management of active medical problems listed below. Physiatrist and rehab team continue to assess barriers to discharge/monitor patient progress toward functional and medical goals  Care Tool:  Bathing    Body parts bathed by patient: Chest, Abdomen, Face, Right arm   Body parts bathed by helper: Left arm, Front perineal area, Buttocks, Right upper leg, Left lower leg, Right lower leg, Left upper leg     Bathing assist Assist Level: Maximal Assistance - Patient 24 - 49%     Upper Body Dressing/Undressing Upper body dressing   What is the patient wearing?: Pull over shirt    Upper body assist Assist Level: Moderate Assistance - Patient 50 - 74%    Lower Body Dressing/Undressing Lower body dressing      What is the patient wearing?: Pants  Lower body assist Assist for lower body dressing: Maximal Assistance - Patient 25 - 49%     Toileting Toileting    Toileting assist Assist for toileting: 2 Helpers     Transfers Chair/bed transfer  Transfers assist  Chair/bed transfer activity did not occur: Safety/medical concerns (unsafe to get up)  Chair/bed transfer assist level: Total Assistance - Patient < 25%     Locomotion Ambulation   Ambulation assist   Ambulation activity did not occur: Safety/medical concerns  Assist level: Total Assistance - Patient < 25% Assistive device: Other (comment) (3 musketeer with WC follow) Max distance: 8   Walk 10 feet activity   Assist  Walk 10 feet activity did not occur: Safety/medical concerns        Walk 50  feet activity   Assist Walk 50 feet with 2 turns activity did not occur: Safety/medical concerns         Walk 150 feet activity   Assist Walk 150 feet activity did not occur: Safety/medical concerns         Walk 10 feet on uneven surface  activity   Assist Walk 10 feet on uneven surfaces activity did not occur: Safety/medical concerns         Wheelchair     Assist Is the patient using a wheelchair?: Yes Type of Wheelchair: Manual    Wheelchair assist level: Dependent - Patient 0%      Wheelchair 50 feet with 2 turns activity    Assist    Wheelchair 50 feet with 2 turns activity did not occur: Safety/medical concerns   Assist Level: Dependent - Patient 0%   Wheelchair 150 feet activity     Assist  Wheelchair 150 feet activity did not occur: Safety/medical concerns   Assist Level: Dependent - Patient 0%   Blood pressure 128/72, pulse (!) 54, temperature 97.7 F (36.5 C), resp. rate 16, height 6' (1.829 m), weight 77 kg, SpO2 99%.  Medical Problem List and Plan: 1. 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  Antithrombotics: -DVT/anticoagulation:  Pharmaceutical: Lovenox             -antiplatelet therapy: DAPT 3. Pain Management: Tylenol prn. Gabapentin 100 mg TID was added on 11/28   RIght shoulder pain  hemiparetic side ,seems intermittent overall improved with strength 4. Mood/Behavior/Sleep:  LCSW to follow for evaluation and support.              -antipsychotic agents: N/A--monitor for delirium/MS changes with recent stroke/change in environment insetting of baseline dementia. 5. Neuropsych/cognition: This patient may be intermittently capable of making decisions on his own behalf.             --family has noticed some disorientation and have been  present 24/7 at [facility]. Unable to do that here             --also [facility] and wears bilateral hearing aids. 6. Skin/Wound Care:  Routine pressure relief pressure.   7. Fluids/Electrolytes/Nutrition: Monitor I/O. Check CMET in am             --Monitor I/O. Lasix was d/c     Latest Ref Rng & Units  03/05/2023    5:15 AM 03/01/2023    5:09 AM 02/26/2023    5:32 AM  BMP  Glucose 70 - 99 mg/dL 92  87  92   BUN 8 - 23 mg/dL 20  23  22    Creatinine 0.61 - 1.24 mg/dL 1.61  0.96  0.45   Sodium 135 - 145 mmol/L 136  137  133   Potassium 3.5 - 5.1 mmol/L 3.7  4.0  3.7   Chloride 98 - 111 mmol/L 103  104  101   CO2 22 - 32 mmol/L 25  26  24    Calcium 8.9 - 10.3 mg/dL 9.7  9.6  9.1      8. CAD/Chronic diastolic CHF: Followed by Dr. Allyson Sabal. HH diet with strict I/O. Daily weights             --Most recent PCI 12/29/2022 followed by admissions for fluid overload Continue Jardiance , Lopressor , aldactone              --was on Lasix 40 mg BID PTA-->d/c on 12/04 for ??. Monitor for signs of overload  12/14- hasn't had weights checked for 3 days- will d/w nursing.  9. Chronic respiratory failure w/nocturnal hypoxia: Since Sept--sleep study negative for OSA             --currently oxygen dependent with activity and intermittently during hospitalization --Encourage pulmonary hygiene. Will check CXR to rule out overload.              --Oxygen prn during the day and continue at nights.    10. New diagnosis B-cell lymphoma: Developing multiple nodules for past 2 months- plan was for OP f/u with oncology in [place],[facility] to set up f/u with  Dr Melvyn Neth in [facility]              --just received bx reports yesterday-->called derm for path report             --question coagulopathy as cause of stroke. Will check dopplers          Per onc Non Hodgkins Lymphoma + comorbid conditions qualify pt for in home hospice.  Per Onc consult palliative care consult recommended  but not ordered . Will order 11/13   12/14-  Seen by palliative care-pt wants to hear from outside oncology before deciding on DNR status/ vs full code.   11. CKD 3a: Baseline SCr 1.4-1.5 per records             --monitor weekly  12. Hx of anemia of chronic disease:Hgb stable as well as B12            --continue iron supplement    Latest Ref Rng & Units 03/05/2023    5:15 AM 03/01/2023    5:09 AM 02/26/2023    5:32 AM  CBC  WBC 4.0 - 10.5 K/uL 8.2  7.4  7.6   Hemoglobin 13.0 - 17.0 g/dL 40.9  81.1  91.4   Hematocrit 39.0 - 52.0 % 38.8  38.4  37.8   Platelets 150 - 400 K/uL 383  351  350     13. Hx of vitamin B 12 deficiency: Continue high doses of B12   14. Chronic left knee pain: Monitor for symptoms with increase in activity/Right hemipareis  15. Hx major depressive d/o: continue zoloft.   16. Hx prostate cancer- sounds like could be indolent:Has urgency/nocturia. Toilet every 4 hours. Continue to check PVRs  17. Hx prediabetes: Hgb  A 1c- 5.9 few months ago. Monitor fasting BS with serial labs.   18. Sore throat  12/14- pt doesn't want throat spray- can add if needed.   12/15- sore throat gone this AM       LOS: 11 days A FACE TO FACE EVALUATION WAS PERFORMED  Erick Colace 03/05/2023, 7:57 AM

## 2023-03-05 NOTE — Progress Notes (Addendum)
Palliative Medicine Inpatient Follow Up Note HPI: Jeffrey Acevedo is a [name] with history of CAD, HTN, CKD 3a, chronic diastolic CHF, B-Cell Lymphoma, hearing loss, dementia, who was admitted to [facility] on 02/15/23 with reports of right sided weakness and inability hold fork or to feed himself.    The Palliative care team has been asked to support additional goals of care conversations.   Today's Discussion 03/05/2023  *Please note that this is a verbal dictation therefore any spelling or grammatical errors are due to the "Dragon Medical One" system interpretation.  Chart reviewed inclusive of vital signs, progress notes, laboratory results, and diagnostic images.   A meeting was held at bedside this afternoon in the company of patient's 3 daughters, son-in-law, wife, and Jeffrey Acevedo.  We discussed patient's stroke and newly identified B-cell lymphoma.  We reviewed the difficulty of patient's current medical situation in the context of not only his loss of independence but the additional layer of newly identified cancer.  Discussed goals moving into the future in the context of patient's present health.  He is hopeful to get back home with his daughter Jeffrey Acevedo and enjoy time with her to beagles.  He is hopeful to spend time with his wife and the rest of his family.  We did discuss difficult topics such as resuscitation status. Patient's wife was very helpful at asserting that Jeffrey Acevedo had always shared he would not want aggressive resuscitative measures.  The whole family agrees that a DO NOT RESUSCITATE is within patient's best interest though Jeffrey Acevedo is very tearful at the time of assessment and having a difficult time localizing a decision.  We reviewed further the difference between palliative and hospice care:  Palliative care is specialized medical care for people living with a serious illness, such as cancer, heart failure, COPD, Alzheimer's dementia, etc. Patients in palliative care  may receive medical care for their symptoms, or palliative care, along with treatment intended to cure their serious illness   Hospice care focuses on the care, comfort, and quality of life of a person with a serious illness who is approaching the end of life.At some point, it may not be possible to cure a serious illness, or a patient may choose not to undergo certain treatments. Hospice is designed for this situation.  At this point in time patient is hopeful to hear from the oncologist and for further determinations to be made thereafter.  ____________________ Addendum:  I met with patient's pastor and 2 of his daughters outside of the room.  We discussed various options moving forward.  Patient's family expressed that they really would like to speak to oncology and they had not realized an oncologist had seen him and patient.  I shared that I would do my best to make this happen.  Patient's family interested in speaking to hospice of the [place] to better identify what services could be provided.  I shared that I would speak to Jeffrey Acevedo to arrange an appointment they identified 330 would be best.  Questions and concerns addressed/Palliative Support Provided.   Objective Assessment: Vital Signs Vitals:   03/04/23 1955 03/05/23 0547  BP: 111/62 128/72  Pulse: 60 (!) 54  Resp: 16 16  Temp: 98.3 F (36.8 C) 97.7 F (36.5 C)  SpO2: 96% 99%    Intake/Output Summary (Last 24 hours) at 03/05/2023 1212 Last data filed at 03/05/2023 0840 Gross per 24 hour  Intake 717 ml  Output 200 ml  Net 517 ml  Last Weight  Most recent update: 02/28/2023  4:01 AM    Weight  77 kg (169 lb 12.1 oz)            Gen: Elderly Caucasian male chronically ill-appearing HEENT: moist mucous membranes CV: Regular rate and rhythm  PULM: On room air breathing is even and nonlabored ABD: soft/nontender  EXT: No edema  Neuro: Alert and oriented x2 --> hard of hearing  SUMMARY OF RECOMMENDATIONS    Full code --> Family agree with a DNAR/DNI. I plan to speak to the patient again tomorrow regarding this    Outpatient palliative support with hospice of [facility] --> Meeting planned for tomorrow at 15:30  Appreciate medical oncology team speaking to family regarding recommendations  Ongoing PMT support  Time Spent: 80 ______________________________________________________________________________________ Jeffrey Acevedo Palliative Medicine Team Team Cell Phone: [phone] Please utilize secure chat with additional questions, if there is no response within 30 minutes please call the above phone number  Palliative Medicine Team providers are available by phone from 7am to 7pm daily and can be reached through the team cell phone.  Should this patient require assistance outside of these hours, please call the patient's attending physician.

## 2023-03-05 NOTE — Progress Notes (Signed)
Speech Language Pathology Daily Session Note  Patient Details  Name: Jeffrey Acevedo MRN: [id number] Date of Birth: 1940/12/26  Today's Date: 03/05/2023 SLP Individual Time: 4098-1191 SLP Individual Time Calculation (min): 30 min  Short Term Goals: Week 1: SLP Short Term Goal 1 (Week 1): Patient will utilize swallowing compensatory strategies during consumption of D3/thin liquid diet given min multimodal A SLP Short Term Goal 2 (Week 1): Patient will demonstrate orientation to self, place, and situation given max multimodal A SLP Short Term Goal 3 (Week 1): Patient will increase speech intelligibility to 80% at the phrase level given mod multimodal A SLP Short Term Goal 4 (Week 1): Patient will recall biographical information given max multimodal A SLP Short Term Goal 5 (Week 1): Patient will demonstrate focused attention during functional therapeutic tasks given max multimodal A SLP Short Term Goal 6 (Week 1): Patient will demonstrate awareness of current medical situation given max multimodal A  Skilled Therapeutic Interventions:  Patient was seen in PM for family education session. Pt's wife, 2 daughters, caregiver, son in law, and pastor present for session. Family with primary concerns regarding pt's swallow. SLP reviewed results of MBSS and recommendations. Education provided on s/s asp, safe swallowing strategies, and foods to avoid. SLP also discussed cognition including changes in memory, problem solving, attention, and executive function. SLP discussed compensatory strategies, HEP, and overall brain health. All education was supplemented with handouts. Pt's family thoroughly engaged throughout session adding insight as needed and contributing thoughtful questions. At conclusion of session pt was left at bedside with family, call button within reach and bed alarm active. SLP to continue POC.   Pain Pain Assessment Pain Scale: 0-10 Pain Score: 0-No pain  Therapy/Group: Individual  Therapy  Renaee Munda 03/05/2023, 4:20 PM

## 2023-03-05 NOTE — Progress Notes (Signed)
Physical Therapy Session Note  Patient Details  Name: Jeffrey Acevedo MRN: [id number] Date of Birth: 11-03-1940  Today's Date: 03/05/2023 PT Individual Time: 1402-1500 PT Individual Time Calculation (min): 58 min   Short Term Goals: Week 2:  PT Short Term Goal 1 (Week 2): Pt will perform sit<>stand transfers with overall Mod/ MaxA. PT Short Term Goal 2 (Week 2): Pt will maintain unsupported seated balance for more than 5 min with ModA. PT Short Term Goal 3 (Week 2): Pt will perform stand pivot transfers w/ Max A +1. PT Short Term Goal 4 (Week 2): Pt will ambulate at least 10 ft using LRAD with MaxA.  Skilled Therapeutic Interventions/Progress Updates:       Direct handoff of care from COTA with patient sitting in EOM with family guarding/assisting with sitting balance.   Session focused on family education and hands on training. Spent the first part of session reviewing PT POC, PT goals, home safety, DME rec's, follow up care, and answering any questions/concerns from family. Spent time discussing home setup (5 STE), recommendation for permanent ramp for home entrance, hospital bed management, etc.   Family assisted patient via [place] from [facility] to w/c with PT providing CGA for safety. Family doing well with cueing patient for forward leaning during sit<>stand transition and guarding patient as he sit's in the [place]. PT providing min cues for [facility] management such as locking the brakes and widening the feet to accommodate for the wheelchair.   Discussed car transfers vs non-emergent ambulance transport. Family requesting to try a car transfer so transferred patient to ortho rehab gym.   Setup at Car with car height simulating their personal vehicle. Patient's family member, Jeffrey Acevedo, assisted patient with the squat pivot transfer with PT providing additional minA for safety. Mod cues for setup for w/c management. Patient needing assistance lifting up BLE into and out of the vehicle during  transfer. Cues also needed for hand placement, forward leaning, and safety awareness while transferring the patient.   Discussed home entrance being main barrier to home. Again, reviewed recommendation for getting a permanent ramp installed. Also reviewed alternative options, such as bumping up/down at wheelchair level with x2 person assist.   Patient taken to main rehab gym with the 6" steps and this was completed. Patient bumped up/down x4, 6" steps with x2 person assist at dependent wheelchair level. PT providing assistance for the first time and leading the stair navigation. Patient bumped up the steps while backward facing and down the steps while forward facing. Used gait belt for added safety to strap patient to wheelchair as a "seat belt." Patient's family members then assisted with going up/down the x4 stairs with Jeffrey Acevedo leading the stair navigation and PT providing CGA for safety. Recommend further hands on training and education if this is how family plan on assisting patient into their home.   Patient returned to the room and Jeffrey Acevedo assisted with completing the squat pivot transfer back to bed with PT providing minA for safety. PT assisting with bed repositioning for time management and patient boosted to [facility] For comfort. Pillows placed under BUE for comfort, all needs met. Family thankful for education and plan on being present tomorrow for more education and training.    Therapy Documentation Precautions:  Precautions Precautions: Fall, Other (comment) Precaution Comments: strong posterior and pusher Restrictions Weight Bearing Restrictions Per Provider Order: No General:     Therapy/Group: Individual Therapy  Orrin Brigham 03/05/2023, 7:53 AM

## 2023-03-05 NOTE — Progress Notes (Signed)
Occupational Therapy Session Note  Patient Details  Name: Jeffrey Acevedo MRN: [id number] Date of Birth: 20-Jun-1940  Today's Date: 03/05/2023 OT Individual Time: [phone] 1305-1403  OT Individual Time Calculation (min): 45 min    Short Term Goals: Week 2:  OT Short Term Goal 1 (Week 2): pt will rise to stand in stedy lift with mod A of 1 5x in a row to ensure he can safely use stedy lift for toilet transfers. OT Short Term Goal 2 (Week 2): Pt will complete squat pivot to [facility] with mod A of 1 person. OT Short Term Goal 3 (Week 2): pt will hold static standing with mod A of 1 during toileting tasks.  Skilled Therapeutic Interventions/Progress Updates:  Session 1:  Pt greeted asleep in supine, pt easily able to arouse and pt agreeable to OT intervention.      Transfers/bed mobility: pt completed supine>sit with MIN A +2, pt needed assist for initiation, elevating trunk and scooting hips to EOB. Pt completed sit>supine with total A +2 as pt began sliding off EOB.    ADLs:  UB dressing:donned OH shirt with MODA, pt needed assist to orient shirt and to fully thread RUE into sleeve first. Pt did however initiate threading RUE first.  LB dressing: donned pants from EOB with MAXA, pt able to stand with MAX A +2 to pull pants to waist line  Self feeding: assisted pt with self feeding with pt positioned in elevated position in bed. Set- up food items with pt mostly preferring to use L hand  to self feed  but did use utensils intermittently to retrieve food items from plate.   Ended session with pt supine in bed with all needs within reach and bed alarm activated.                    Session 2: Pt greeted supine in bed with family present ( wife, caregiver, 2 daughters and brother in law), pt agreeable to OT intervention.      family education provided to family on the below topics: -always using gait belt for functional mobility and +2 assist -recommendation on using stedy at home for all  functional mobility  - general assist needed for ADLS and functional mobility ( I.e MOD- MAX A) -recommendation for timed toileting at home with use of stedy to [facility] to facilitate improved continence for b/b -general education provided on support for RUE and pts overall balance, I.e pt always needs to be guarded on R side when sitting on stedy or EOB -recommendation to never leave pt unattended on [facility] -education provided on appropriate cues needed I.e visual and tactile preferred and allowing increased time for transitions - discussed DME needs I.e hospital bed, BSC, stedy, manual w/c -recommendation for bed level dressing -family wanting to put pt in shower at home, recommended using stedy for transfer with shower chair with bilateral arm rests and +2 assist for bathing  Family demonstrated appropriate level of assist with ADLs and functional mobility, both daughters and brother in law demonstrated ability to assist pt with squat pivot transfers and stedy transfers.   Ended session with pt handed off directly to PT for next session.          Therapy Documentation Precautions:  Precautions Precautions: Fall, Other (comment) Precaution Comments: strong posterior and pusher Restrictions Weight Bearing Restrictions Per Provider Order: No  Pain: No pain reported during either session    Therapy/Group: Individual Therapy  Barron Schmid 03/05/2023,  12:12 PM

## 2023-03-06 ENCOUNTER — Ambulatory Visit: Payer: Medicare PPO | Admitting: General Practice

## 2023-03-06 DIAGNOSIS — Z515 Encounter for palliative care: Secondary | ICD-10-CM | POA: Diagnosis not present

## 2023-03-06 DIAGNOSIS — Z7189 Other specified counseling: Secondary | ICD-10-CM | POA: Diagnosis not present

## 2023-03-06 DIAGNOSIS — I639 Cerebral infarction, unspecified: Secondary | ICD-10-CM | POA: Diagnosis not present

## 2023-03-06 DIAGNOSIS — F01B4 Vascular dementia, moderate, with anxiety: Secondary | ICD-10-CM | POA: Diagnosis not present

## 2023-03-06 DIAGNOSIS — I509 Heart failure, unspecified: Secondary | ICD-10-CM | POA: Diagnosis not present

## 2023-03-06 DIAGNOSIS — C8338 Diffuse large B-cell lymphoma, lymph nodes of multiple sites: Secondary | ICD-10-CM | POA: Diagnosis not present

## 2023-03-06 NOTE — Progress Notes (Signed)
Patient ID: Jeffrey Acevedo, male   DOB: [birth year], 82 y.o.   MRN: [id number]  Therapy team has provided DME recommendations for a hospital bed, stedy lift, Bariatric bedside commode and wheelchair (waiting on size needed).

## 2023-03-06 NOTE — Progress Notes (Signed)
Occupational Therapy Session Note  Patient Details  Name: Jeffrey Acevedo MRN: [id number] Date of Birth: 15-Jul-1940  Today's Date: 03/06/2023 OT Individual Time: 1300-1405 OT Individual Time Calculation (min): 65 min    Short Term Goals: Week 1:  OT Short Term Goal 1 (Week 1): to increase safety to be able to sit on a toilet, pt will maintain balance at EOB with CGA for at least 10 minutes. OT Short Term Goal 1 - Progress (Week 1): Met OT Short Term Goal 2 (Week 1): Pt will demonstrate improved motor planning of LUE to use L hand to help don shirt with mod A. OT Short Term Goal 2 - Progress (Week 1): Met OT Short Term Goal 3 (Week 1): Pt will use R hand to wash L arm with min A. OT Short Term Goal 3 - Progress (Week 1): Not progressing OT Short Term Goal 4 (Week 1): pt will rise to stand in stedy lift with mod A of 1 5x in a row to ensure he can safely use stedy lift for toilet transfers. OT Short Term Goal 4 - Progress (Week 1): Progressing toward goal Week 2:  OT Short Term Goal 1 (Week 2): pt will rise to stand in stedy lift with mod A of 1 5x in a row to ensure he can safely use stedy lift for toilet transfers. OT Short Term Goal 2 (Week 2): Pt will complete squat pivot to [facility] with mod A of 1 person. OT Short Term Goal 3 (Week 2): pt will hold static standing with mod A of 1 during toileting tasks.   Skilled Therapeutic Interventions/Progress Updates:    1:1 Pt received in the bed. Focus on family education (there were 5 family members). Discussed again dressing at bed level. Pt's pants were wet and we changed him rolling with mod A teaching good body mechnanics. Discussed and introduced the hoyer lift as an option. Rolled to place the pad and lifted him up to a manual chair with manual hoyer; with one family tilting the chair back, another one managing the patient and pulling his body back to be able to sit back into the chair and another one managing the lowering knob. Also dicussed  measurements of house and whether the w/c can get into the bathroom (pt currently in 18x20 chair - 28 inch wide wheel to wheel). Pt discussed that they really want the patient to shower at home. Discussed using a tub bench to access the shower with a squat pivot to the bench and then sliding over to the pt's right. Also practiced sit to stands in the [facility]; setting the machine up properly and with +2 helping pt come up into sitting. Discussed toilteing from the bed v BSC with hoyer v STEDY.   Family appreciative and hoyer instruction to be left in the room.   Therapy Documentation Precautions:  Precautions Precautions: Fall, Other (comment) Precaution Comments: strong posterior and pusher Restrictions Weight Bearing Restrictions Per Provider Order: No  Pain: Pain Assessment Pain Scale: 0-10 Pain Score: 0-No pain    Therapy/Group: Individual Therapy  Roney Mans [facility] 03/06/2023, 3:43 PM

## 2023-03-06 NOTE — Progress Notes (Signed)
Physical Therapy Session Note  Patient Details  Name: Jeffrey Acevedo MRN: [id number] Date of Birth: Jan 01, 1941  Today's Date: 03/06/2023 PT Individual Time: 2130-8657 PT Individual Time Calculation (min): 72 min   Short Term Goals: Week 2:  PT Short Term Goal 1 (Week 2): Pt will perform sit<>stand transfers with overall Mod/ MaxA. PT Short Term Goal 2 (Week 2): Pt will maintain unsupported seated balance for more than 5 min with ModA. PT Short Term Goal 3 (Week 2): Pt will perform stand pivot transfers w/ Max A +1. PT Short Term Goal 4 (Week 2): Pt will ambulate at least 10 ft using LRAD with MaxA. Week 3:     Skilled Therapeutic Interventions/Progress Updates:      Therapy Documentation Precautions:  Precautions Precautions: Fall, Other (comment) Precaution Comments: strong posterior and pusher Restrictions Weight Bearing Restrictions Per Provider Order: No General:   Vital Signs:   Pain: Pain Assessment Pain Scale: 0-10 Pain Score: 0-No pain   Therapy/Group: Individual Therapy  Loel Dubonnet PT, DPT, CSRS 03/06/2023, 5:34 PM

## 2023-03-06 NOTE — Progress Notes (Signed)
   We received referral yesterday from hospital staff to meet and discuss home options at d/c. I have left VM with the daughter Romie Morrow that we will be able to meet at her recommended time of 330pm today. She has confirmed that the family will be present.   Norm Parcel RN [phone]

## 2023-03-06 NOTE — Progress Notes (Signed)
   Palliative Medicine Inpatient Follow Up Note HPI: Ward Jeffrey Acevedo. Jeffrey Acevedo is a [name] with history of CAD, HTN, CKD 3a, chronic diastolic CHF, B-Cell Lymphoma, hearing loss, dementia, who was admitted to [facility] on 02/15/23 with reports of right sided weakness and inability hold fork or to feed himself.    The Palliative care team has been asked to support additional goals of care conversations.   Today's Discussion 03/06/2023  *Please note that this is a verbal dictation therefore any spelling or grammatical errors are due to the "Dragon Medical One" system interpretation.  Chart reviewed inclusive of vital signs, progress notes, laboratory results, and diagnostic images.   I met with [name] this morning. He is awake and alert. He did recall the conversation yesterday though not the complete context. Patient asserts that he would not want to be on machines and would want to allow a natural death. He shares that he does not know what his future will looks like. He is most hopeful to get home safely.   I called and spoke to patients daughter, Selena Batten and wife, [name]. We confirmed the plan for a DNAR/DNI code status. We discussed that they had spoken to Dr. Arbutus Ped overnight and understand the severity of patients cancer. They are more interested in hospice care at this point. They still would like to keep their appointment on Monday though realize that the options may be quite limited. We discussed that they would need to talk to the hospice company about the coordinate of this appointment. Family contends that [facility] has a "cancer policy" and feel his appointment may be covered under this.   At this time patients family plan to meet with hospice this evening.   Questions and concerns addressed/Palliative Support Provided.   Objective Assessment: Vital Signs Vitals:   03/05/23 1943 03/06/23 0525  BP: (!) 110/52 101/74  Pulse: 65   Resp: 18 17  Temp: 97.9 F (36.6 C) 97.9 F (36.6  C)  SpO2: 93% 94%    Intake/Output Summary (Last 24 hours) at 03/06/2023 1257 Last data filed at 03/06/2023 0800 Gross per 24 hour  Intake 477 ml  Output --  Net 477 ml   Last Weight  Most recent update: 02/28/2023  4:01 AM    Weight  77 kg (169 lb 12.1 oz)            Gen: Elderly Caucasian male chronically ill-appearing HEENT: moist mucous membranes CV: Regular rate and rhythm  PULM: On room air breathing is even and nonlabored ABD: soft/nontender  EXT: No edema  Neuro: Alert and oriented x2 --> hard of hearing  SUMMARY OF RECOMMENDATIONS   DNAR/DNI    Patients family is leaning towards hospice - they plan to speak to [facility] this afternoon  Ongoing PMT support  MDM: High ______________________________________________________________________________________ Lamarr Lulas [place] Palliative Medicine Team Team Cell Phone: [phone] Please utilize secure chat with additional questions, if there is no response within 30 minutes please call the above phone number  Palliative Medicine Team providers are available by phone from 7am to 7pm daily and can be reached through the team cell phone.  Should this patient require assistance outside of these hours, please call the patient's attending physician.

## 2023-03-06 NOTE — Progress Notes (Signed)
Physical Therapy Session Note  Patient Details  Name: Jeffrey Acevedo MRN: [id number] Date of Birth: [birth year]  Today's Date: 03/06/2023 PT Individual Time: 0903-1000 PT Individual Time Calculation (min): 57 min   Short Term Goals: Week 1:  PT Short Term Goal 1 (Week 1): Pt will transfer sup <> sit w/ mod A consistently. PT Short Term Goal 1 - Progress (Week 1): Met PT Short Term Goal 2 (Week 1): Pt will transfer sit to stand w/ max A. PT Short Term Goal 2 - Progress (Week 1): Partly met PT Short Term Goal 3 (Week 1): Pt will maintain seated balance > 5' at EOB. PT Short Term Goal 3 - Progress (Week 1): Not met PT Short Term Goal 4 (Week 1): Pt will transfer SPT w/ max A +1. PT Short Term Goal 4 - Progress (Week 1): Not met PT Short Term Goal 5 (Week 1): PT to assess gait. PT Short Term Goal 5 - Progress (Week 1): Met Week 2:  PT Short Term Goal 1 (Week 2): Pt will perform sit<>stand transfers with overall Mod/ MaxA. PT Short Term Goal 2 (Week 2): Pt will maintain unsupported seated balance for more than 5 min with ModA. PT Short Term Goal 3 (Week 2): Pt will perform stand pivot transfers w/ Max A +1. PT Short Term Goal 4 (Week 2): Pt will ambulate at least 10 ft using LRAD with MaxA.  Skilled Therapeutic Interventions/Progress Updates:  Patient supine in bed on entrance to room. Patient initially asleep but easily roused. Then alert and agreeable to PT session. RN present to provide morning medications.   Patient with no pain complaint at start of session.  Therapeutic Activity: Bed Mobility: Pt performed supine > sit with vc for technique and completes with MinA and extra time. At end of session, requires vc for return to sidelying and ModA to bring BLE to bed surface. MinA to roll into supine and ModA to position BLE for extensor push to assist therapist toward [facility]. Requires MinA +2 with pt's assist.  Transfers: Pt performed squat pivot transfer toward L side into standard w/c  from bed with ModA and CGA from +2.  Provided vc and visual cues prior to initiation of transfer for improved assist from pt.  Wheelchair Mobility:  Pt educated in propel of w/c and required assist from LUE initially. Pt relates understanding of direction of path when using only one UE. Hoever is only able to push wheel with one UE and continues in path to R. Introduced use of BLE and need to "walk" BLE in order to maintain forward/ straight path. Despite multimodal cues, he is unable to maintain bipedal propel without Mod/ MaxA and consistent cues. Removed LUE from wheel in order to attempt to maintain straight path with BLE. Continues to require Mod/ MAxA and max cues.   Neuromuscular Re-ed: NMR facilitated during session with focus on standing balance. Pt guided in rise to stand with hand to L HR, pt continues to demo flexed posture with Bil knee flexion. Attempted x5 with pt fatiguing and requiring increased assist throughout. From Min/ Mod up to MaxA. Attempt to attain standingbalance with decreasing physical assist provided but pt increases knee flexion without assist into upright stance. NMR performed for improvements in motor control and coordination, balance, sequencing, judgement, and self confidence/ efficacy in performing all aspects of mobility at highest level of independence.   Patient supine in bed at end of session with brakes locked, no alarm set d/t bed  malfunction, but telesitter in place, and all needs within reach.   Therapy Documentation Precautions:  Precautions Precautions: Fall, Other (comment) Precaution Comments: strong posterior and pusher Restrictions Weight Bearing Restrictions Per Provider Order: No  Pain: Pain Assessment Pain Scale: 0-10 Pain Score: 0-No pain related this session.    Therapy/Group: Individual Therapy  Loel Dubonnet PT, DPT, CSRS 03/06/2023, 5:31 PM

## 2023-03-06 NOTE — Progress Notes (Signed)
PROGRESS NOTE   Subjective/Complaints:  No new problems per pt this am .  Appreciate palliative note    ROS-limited by cognition/behavior.  Objective:   No results found.  Recent Labs    03/05/23 0515  WBC 8.2  HGB 12.3*  HCT 38.8*  PLT 383    Recent Labs    03/05/23 0515  NA 136  K 3.7  CL 103  CO2 25  GLUCOSE 92  BUN 20  CREATININE 1.07  CALCIUM 9.7     Intake/Output Summary (Last 24 hours) at 03/06/2023 0740 Last data filed at 03/05/2023 1400 Gross per 24 hour  Intake 477 ml  Output --  Net 477 ml     Pressure Injury 09/19/21 Coccyx Unstageable - Full thickness tissue loss in which the base of the injury is covered by slough (yellow, tan, gray, green or [place]man) and/or eschar (tan, Sebastiano or black) in the wound bed. oval shaped wound on coccyx with yello (Active)  09/19/21 1450  Location: Coccyx  Location Orientation:   Staging: Unstageable - Full thickness tissue loss in which the base of the injury is covered by slough (yellow, tan, gray, green or Overall) and/or eschar (tan, [place] or black) in the wound bed.  Wound Description (Comments): oval shaped wound on coccyx with yellow slough  Present on Admission: Yes (Originally listed as stage 1 09/19/21; labeled as unstageable 02/22/23 on admit to CIR)    Physical Exam: Vital Signs Blood pressure 101/74, pulse 65, temperature 97.9 F (36.6 C), resp. rate 17, height 6' (1.829 m), weight 77 kg, SpO2 94%.   General: No acute distress Mood and affect are appropriate Heart: Regular rate and rhythm no rubs murmurs or extra sounds Lungs: Clear to auscultation, breathing unlabored, no rales or wheezes Abdomen: Positive bowel sounds, soft nontender to palpation, nondistended Extremities: No clubbing, cyanosis, or edema Skin: No evidence of breakdown, no evidence of rash  Neurologic: Cranial nerves II through XII intact, motor strength is 5/5 in Left and 4/5  RIght deltoid, bicep, tricep, grip, 5/5 bilateral hip flexor, knee extensors, ankle dorsiflexor and plantar flexor Oriented to person but not time   Musculoskeletal: no pain with AROM in all 4 ext  No joint swelling    Assessment/Plan: 1. Functional deficits which require 3+ hours per day of interdisciplinary therapy in a comprehensive inpatient rehab setting. Physiatrist is providing close team supervision and 24 hour management of active medical problems listed below. Physiatrist and rehab team continue to assess barriers to discharge/monitor patient progress toward functional and medical goals  Care Tool:  Bathing    Body parts bathed by patient: Chest, Abdomen, Face, Right arm   Body parts bathed by helper: Left arm, Front perineal area, Buttocks, Right upper leg, Left lower leg, Right lower leg, Left upper leg     Bathing assist Assist Level: Maximal Assistance - Patient 24 - 49%     Upper Body Dressing/Undressing Upper body dressing   What is the patient wearing?: Pull over shirt    Upper body assist Assist Level: Moderate Assistance - Patient 50 - 74%    Lower Body Dressing/Undressing Lower body dressing      What  is the patient wearing?: Pants     Lower body assist Assist for lower body dressing: Maximal Assistance - Patient 25 - 49%     Toileting Toileting    Toileting assist Assist for toileting: 2 Helpers     Transfers Chair/bed transfer  Transfers assist  Chair/bed transfer activity did not occur: Safety/medical concerns (unsafe to get up)  Chair/bed transfer assist level: Total Assistance - Patient < 25%     Locomotion Ambulation   Ambulation assist   Ambulation activity did not occur: Safety/medical concerns  Assist level: Total Assistance - Patient < 25% Assistive device: Other (comment) (3 musketeer with WC follow) Max distance: 8   Walk 10 feet activity   Assist  Walk 10 feet activity did not occur: Safety/medical concerns         Walk 50 feet activity   Assist Walk 50 feet with 2 turns activity did not occur: Safety/medical concerns         Walk 150 feet activity   Assist Walk 150 feet activity did not occur: Safety/medical concerns         Walk 10 feet on uneven surface  activity   Assist Walk 10 feet on uneven surfaces activity did not occur: Safety/medical concerns         Wheelchair     Assist Is the patient using a wheelchair?: Yes Type of Wheelchair: Manual    Wheelchair assist level: Dependent - Patient 0%      Wheelchair 50 feet with 2 turns activity    Assist    Wheelchair 50 feet with 2 turns activity did not occur: Safety/medical concerns   Assist Level: Dependent - Patient 0%   Wheelchair 150 feet activity     Assist  Wheelchair 150 feet activity did not occur: Safety/medical concerns   Assist Level: Dependent - Patient 0%   Blood pressure 101/74, pulse 65, temperature 97.9 F (36.6 C), resp. rate 17, height 6' (1.829 m), weight 77 kg, SpO2 94%.  Medical Problem List and Plan: 1. 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  Antithrombotics: -DVT/anticoagulation:  Pharmaceutical: Lovenox             -antiplatelet therapy: DAPT 3. Pain Management: Tylenol prn. Gabapentin 100 mg TID was added on 11/28   RIght shoulder pain  hemiparetic side ,seems intermittent overall improved with strength 4. Mood/Behavior/Sleep:  LCSW to follow for evaluation and support.              -antipsychotic agents: N/A--monitor for delirium/MS changes with recent stroke/change in environment insetting of baseline dementia. 5. Neuropsych/cognition: This patient may be intermittently capable of making decisions on his own behalf.             --family has noticed some disorientation and have  been present 24/7 at [facility]. Unable to do that here             --also [facility] and wears bilateral hearing aids. 6. Skin/Wound Care:  Routine pressure relief pressure.   7. Fluids/Electrolytes/Nutrition: Monitor I/O. Check CMET in am             --Monitor I/O. Lasix was d/c  Latest Ref Rng & Units 03/05/2023    5:15 AM 03/01/2023    5:09 AM 02/26/2023    5:32 AM  BMP  Glucose 70 - 99 mg/dL 92  87  92   BUN 8 - 23 mg/dL 20  23  22    Creatinine 0.61 - 1.24 mg/dL 5.40  9.81  1.91   Sodium 135 - 145 mmol/L 136  137  133   Potassium 3.5 - 5.1 mmol/L 3.7  4.0  3.7   Chloride 98 - 111 mmol/L 103  104  101   CO2 22 - 32 mmol/L 25  26  24    Calcium 8.9 - 10.3 mg/dL 9.7  9.6  9.1      8. CAD/Chronic diastolic CHF: Followed by Dr. Allyson Sabal. HH diet with strict I/O. Daily weights             --Most recent PCI 12/29/2022 followed by admissions for fluid overload Continue Jardiance , Lopressor , aldactone              --was on Lasix 40 mg BID PTA-->d/c on 12/04 for ??. Monitor for signs of overload  12/14- hasn't had weights checked for 3 days- will d/w nursing.  9. Chronic respiratory failure w/nocturnal hypoxia: Since Sept--sleep study negative for OSA             --currently oxygen dependent with activity and intermittently during hospitalization --Encourage pulmonary hygiene. Will check CXR to rule out overload.              --Oxygen prn during the day and continue at nights.    10. New diagnosis B-cell lymphoma: Developing multiple nodules for past 2 months- plan was for OP f/u with oncology in [place] to set up f/u with  Dr Melvyn Neth in [place]              --just received bx reports yesterday-->called derm for path report             --question coagulopathy as cause of stroke. Will check dopplers          Per onc Non Hodgkins Lymphoma + comorbid conditions qualify pt for in home hospice.  Per Onc consult palliative care consult recommended  but not ordered . Will order 11/13      11. CKD 3a: Baseline SCr 1.4-1.5 per records             --monitor weekly  12. Hx of anemia of chronic disease:Hgb stable as well as B12            --continue iron supplement    Latest Ref Rng & Units 03/05/2023    5:15 AM 03/01/2023    5:09 AM 02/26/2023    5:32 AM  CBC  WBC 4.0 - 10.5 K/uL 8.2  7.4  7.6   Hemoglobin 13.0 - 17.0 g/dL 47.8  29.5  62.1   Hematocrit 39.0 - 52.0 % 38.8  38.4  37.8   Platelets 150 - 400 K/uL 383  351  350     13. Hx of vitamin B 12 deficiency: Continue high doses of B12   14. Chronic left knee pain: Monitor for symptoms with increase in activity/Right hemipareis  15. Hx major depressive d/o: continue zoloft.   16. Hx prostate cancer- sounds like could be indolent:Has urgency/nocturia. Toilet every 4 hours. Continue to check PVRs  17. Hx prediabetes: Hgb A 1c- 5.9 few months ago. Monitor fasting BS with serial labs.  18. Sore throat  12/14- pt doesn't want throat spray- can add if needed.   12/15- sore throat gone this AM       LOS: 12 days A FACE TO FACE EVALUATION WAS PERFORMED  Erick Colace 03/06/2023, 7:40 AM

## 2023-03-06 NOTE — Plan of Care (Signed)
Slower than anticipated progress and need for increased assistance. Problem: RH Swallowing Goal: LTG Patient will consume least restrictive diet using compensatory strategies with assistance (SLP) Description: LTG:  Patient will consume least restrictive diet using compensatory strategies with assistance (SLP) Flowsheets (Taken 03/06/2023 1554) LTG: Pt Patient will consume least restrictive diet using compensatory strategies with assistance of (SLP): Minimal Assistance - Patient > 75%

## 2023-03-06 NOTE — Progress Notes (Signed)
Speech Language Pathology Weekly Progress and Session Note  Patient Details  Name: Jeffrey Acevedo MRN: [id number] Date of Birth: 09/22/1940  Beginning of progress report period: February 23, 2023 End of progress report period: March 06, 2023  Today's Date: 03/06/2023 SLP Individual Time: 0210-0232 SLP Individual Time Calculation (min): 22 min  Short Term Goals: Week 1: SLP Short Term Goal 1 (Week 1): Patient will utilize swallowing compensatory strategies during consumption of D3/thin liquid diet given min multimodal A SLP Short Term Goal 1 - Progress (Week 1): Met SLP Short Term Goal 2 (Week 1): Patient will demonstrate orientation to self, place, and situation given max multimodal A SLP Short Term Goal 2 - Progress (Week 1): Met SLP Short Term Goal 3 (Week 1): Patient will increase speech intelligibility to 80% at the phrase level given mod multimodal A SLP Short Term Goal 3 - Progress (Week 1): Progressing toward goal SLP Short Term Goal 4 (Week 1): Patient will recall biographical information given max multimodal A SLP Short Term Goal 4 - Progress (Week 1): Met SLP Short Term Goal 5 (Week 1): Patient will demonstrate focused attention during functional therapeutic tasks given max multimodal A SLP Short Term Goal 5 - Progress (Week 1): Met SLP Short Term Goal 6 (Week 1): Patient will demonstrate awareness of current medical situation given max multimodal A SLP Short Term Goal 6 - Progress (Week 1): Met  New Short Term Goals: Week 2: SLP Short Term Goal 1 (Week 2): STGs= LTGS due to ELOS  Weekly Progress Updates: Pt has made steady gains and has met 5 of 6 STG's this reporting period due to improved tolerance of current diet, attention, and recall of biographical information. Currently, pt continues to require mod to max A for cognitive tasks and min A for utilization of safe swallowing strategies during a meal. Pt would benefit from continued skilled SLP intervention to maximize  his functional independence prior to discharge.  Intensity: Minumum of 1-2 x/day, 30 to 90 minutes Frequency: 3 to 5 out of 7 days Duration/Length of Stay: 12/20 Treatment/Interventions: Cognitive remediation/compensation;Dysphagia/aspiration precaution training;Internal/external aids;Speech/Language facilitation;Cueing hierarchy;Therapeutic Activities;Functional tasks;Multimodal communication approach;Patient/family education;Therapeutic Exercise  Daily Session  Skilled Therapeutic Interventions: Pt was seen in PM for family education. Pt was alert and seated upright in [facility] upon SLP arrival. Pt's wife, 2 daughters, caregiver, and son in law were present. SLP reviewed handouts provided on previous date. Family had additional questions in regard to swallowing function. SLP reviewed s/sx asp, safe swallowing strategies and precautions, and foods to avoid. Family engaged completley in session and asked about multiple foods and preparation. Family requesting to allow for liberalization of diet as appropriate for quality of life. Family also inquired about administration of meds due to bitter taste with meds crushed in applesauce. SLP answered questions as able and referred additional questions to appropriate personnel. At conclusion of session pt was left seated upright in [facility] with direct handoff to PT. SLP to continue POC.    Pain Pain Assessment Pain Scale: 0-10 Pain Score: 0-No pain  Therapy/Group: Individual Therapy  Renaee Munda 03/06/2023, 3:43 PM

## 2023-03-07 DIAGNOSIS — I509 Heart failure, unspecified: Secondary | ICD-10-CM | POA: Diagnosis not present

## 2023-03-07 DIAGNOSIS — I639 Cerebral infarction, unspecified: Secondary | ICD-10-CM | POA: Diagnosis not present

## 2023-03-07 DIAGNOSIS — F01B4 Vascular dementia, moderate, with anxiety: Secondary | ICD-10-CM | POA: Diagnosis not present

## 2023-03-07 DIAGNOSIS — C8338 Diffuse large B-cell lymphoma, lymph nodes of multiple sites: Secondary | ICD-10-CM | POA: Diagnosis not present

## 2023-03-07 NOTE — Plan of Care (Signed)
  Problem: RH BOWEL ELIMINATION Goal: RH STG MANAGE BOWEL WITH ASSISTANCE Description: STG Manage Bowel with toileting Assistance. Outcome: Progressing Goal: RH STG MANAGE BOWEL W/MEDICATION W/ASSISTANCE Description: STG Manage Bowel with Medication with mod I Assistance. Outcome: Progressing   Problem: RH BLADDER ELIMINATION Goal: RH STG MANAGE BLADDER WITH ASSISTANCE Description: STG Manage Bladder With toileting Assistance Outcome: Progressing   Problem: RH SKIN INTEGRITY Goal: RH STG SKIN FREE OF INFECTION/BREAKDOWN Description: Manage w min assist Outcome: Progressing

## 2023-03-07 NOTE — Progress Notes (Signed)
Patient ID: Jeffrey Acevedo, male   DOB: 11/22/1940, 82 y.o.   MRN: [id number]  Team Conference Report to Patient/Family  Team Conference discussion was reviewed with the patient and caregiver, including goals, any changes in plan of care and target discharge date.  Patient and caregiver express understanding and are in agreement.  The patient has a target discharge date of 03/09/23.  Sw spoke with daughter, Stanton Kidney. Family has decided to move forward with oncology treatment. Patient set for an appointment on Monday 12/23 in [place].   SW will order DME through [employer], Sw discussed: Hospital Bed, manual wheelchair, hoyer, [place] BSC, TTB and slide board.   SW will request delivery today or tomorrow.  Andria Rhein 03/07/2023, 1:34 PM

## 2023-03-07 NOTE — Progress Notes (Signed)
Occupational Therapy Session Note  Patient Details  Name: Jeffrey Acevedo MRN: [id number] Date of Birth: June 09, 1940  Today's Date: 03/07/2023 OT Individual Time: 4098-1191 OT Individual Time Calculation (min): 40 min    Short Term Goals: Week 2:  OT Short Term Goal 1 (Week 2): pt will rise to stand in stedy lift with mod A of 1 5x in a row to ensure he can safely use stedy lift for toilet transfers. OT Short Term Goal 2 (Week 2): Pt will complete squat pivot to [facility] with mod A of 1 person. OT Short Term Goal 3 (Week 2): pt will hold static standing with mod A of 1 during toileting tasks.  Skilled Therapeutic Interventions/Progress Updates:    Pt received supine with no c/o pain, agreeable to OT session. He spoke at length about his family needing to coordinate cancer appointments and feeling like no one is taking care of this- very confused. Was able to redirect him to task at hand and reassured him family has been incredibly supportive and present for many therapy sessions to prepare for Friday d/c. He came to EOB with mod A for lifting trunk. He attempted to use urinal with mod A to place and hold and mod A for sitting balance statically with posterior lean. No urine void. Pants donned dependently by OT for time management. Sit > stand from EOB in the stedy with only min A initially. He then began trying to push one of the stedy levers with his foot and pushing L, requiring max redirection and assist to keep him safely upright. He was transferred to the w/c via stedy. Oral care with min A and mod cueing to initiate task, demonstrating poor awareness of deficits. Pt was left sitting up in the w/c with all needs met, chair alarm set, and call bell within reach.   Therapy Documentation Precautions:  Precautions Precautions: Fall, Other (comment) Precaution Comments: strong posterior and pusher Restrictions Weight Bearing Restrictions Per Provider Order: No   Therapy/Group: Individual  Therapy  Crissie Reese 03/07/2023, 6:45 AM

## 2023-03-07 NOTE — Progress Notes (Signed)
Physical Therapy Session Note  Patient Details  Name: Jeffrey Acevedo MRN: [id number] Date of Birth: [birth year]  Today's Date: 03/07/2023 PT Individual Time: 1030-1118 PT Individual Time Calculation (min): 48 min   Short Term Goals: Week 1:  PT Short Term Goal 1 (Week 1): Pt will transfer sup <> sit w/ mod A consistently. PT Short Term Goal 1 - Progress (Week 1): Met PT Short Term Goal 2 (Week 1): Pt will transfer sit to stand w/ max A. PT Short Term Goal 2 - Progress (Week 1): Partly met PT Short Term Goal 3 (Week 1): Pt will maintain seated balance > 5' at EOB. PT Short Term Goal 3 - Progress (Week 1): Not met PT Short Term Goal 4 (Week 1): Pt will transfer SPT w/ max A +1. PT Short Term Goal 4 - Progress (Week 1): Not met PT Short Term Goal 5 (Week 1): PT to assess gait. PT Short Term Goal 5 - Progress (Week 1): Met Week 2:  PT Short Term Goal 1 (Week 2): Pt will perform sit<>stand transfers with overall Mod/ MaxA. PT Short Term Goal 2 (Week 2): Pt will maintain unsupported seated balance for more than 5 min with ModA. PT Short Term Goal 3 (Week 2): Pt will perform stand pivot transfers w/ Max A +1. PT Short Term Goal 4 (Week 2): Pt will ambulate at least 10 ft using LRAD with MaxA.  Skilled Therapeutic Interventions/Progress Updates:  Patient supine in bed on entrance to room. Pt is very mobile in bed and attempting to move one leg between rails and exit bed. Also has papers in L hand. L hearing aid has fallen out of ear.   Patient with no pain complaint at start of session.  Therapeutic Activity: Bed Mobility: Assisted with repositioning in bed MaxA with pt able to assist when provided with max cues to position BLE in hooklying. When in heightened state, pt has difficulty following instructions.   Time spent listening to pt's concerns re: having gone home and now back and unsure why he's here as well as thinking that his family should be there to assist in going home.   Transfers: Pt performed sit<>stand and stand pivot transfers throughout session with ***. Provided verbal cues for***.  Gait Training:  Pt ambulated *** ft using *** with ***. Demonstrated ***. Provided vc/ tc for ***.  Wheelchair Mobility:  Pt propelled wheelchair *** feet with ***. Provided vc/ tc for ***.  Neuromuscular Re-ed: NMR facilitated during session with focus on***. Pt guided in ***. NMR performed for improvements in motor control and coordination, balance, sequencing, judgement, and self confidence/ efficacy in performing all aspects of mobility at highest level of independence.   Therapeutic Exercise: Pt performed the following exercises with vc/ tc for proper technique. ***  Patient *** at end of session with brakes locked, *** alarm set, and all needs within reach.   Therapy Documentation Precautions:  Precautions Precautions: Fall, Other (comment) Precaution Comments: strong posterior and pusher Restrictions Weight Bearing Restrictions Per Provider Order: No  Pain: Pain Assessment Pain Scale: 0-10 Pain Score: 0-No pain Faces Pain Scale: No hurt   Therapy/Group: Individual Therapy  Loel Dubonnet PT, DPT, CSRS 03/07/2023, 12:24 PM

## 2023-03-07 NOTE — Progress Notes (Signed)
Patient ID: Jeffrey Acevedo, male   DOB: 1940/04/02, 82 y.o.   MRN: [id number]   Hospital bed, Wheelchair, tub Transfer Bench, [place], [facility], and half lap tray ordered through [employer].

## 2023-03-07 NOTE — Patient Care Conference (Signed)
Inpatient RehabilitationTeam Conference and Plan of Care Update Date: 03/07/2023   Time: 10:04 AM    Patient Name: Jeffrey Acevedo      Medical Record Number: [id number]  Date of Birth: May 25, 1940 Sex: Male         Room/Bed: 4M01C/4M01C-01 Payor Info: Payor: [employer] MEDICARE / Plan: [employer] MEDICARE CHOICE PPO / Product Type: *No Product type* /    Admit Date/Time:  02/22/2023 12:15 PM  Primary Diagnosis:  Stroke (cerebrum) [facility])  Hospital Problems: Principal Problem:   Stroke (cerebrum) (HCC) Active Problems:   Essential hypertension   Primary osteoarthritis of right hip   Acute kidney injury superimposed on chronic kidney disease (HCC)   Chronic anemia   Non-Hodgkin lymphoma of skin [facility])    Expected Discharge Date: Expected Discharge Date: 03/09/23  Team Members Present: Physician leading conference: Dr. Claudette Laws Social Worker Present: Lavera Guise, BSW Nurse Present: Chana Bode, RN PT Present: Ralph Leyden, PT OT Present: Mariann Barter, OT SLP Present: Everardo Pacific, SLP PPS Coordinator present : Fae Pippin, SLP     Current Status/Progress Goal Weekly Team Focus  Bowel/Bladder   pt is incontinent of b/b but is able to let staff know if they are in the room   Regain continence   Time toileting    Swallow/Nutrition/ Hydration   D3/ thin   min A  goal downgraded due to increased need for assistance, tolerance of current diet and possible diet upgrade for regular solids or regular snacks    ADL's   max A UB self care, total A of 2 LB self care, max A squat pivots with +2 d/t posterior and R lean, continues to have R wrist pain and intermittent R shoulder pain; attention and visual focus limited   Mod A overall   family education initated with eduction provided on bed level BADLs, stedy, and hoyer lift transfers; postural control, balance, atttention, visual focus to enagble Pt to engage in functional mobility needed for self care     Mobility   Bed mobility = ModA; Transfers = Mod/ MaxA; Sitting balance = pushes posteriorly (either to R or L); Ambulation/stairs = not attempted for safety as pt unable to coordinate stepping without +3   mod/ max A mobility, mod A bed  Barriers = attention to task, R hemi, pusher; Weekly Focus = NMR for R hemibody, midline orientation, sitting/standing balance, gait initiation, continued family ed    Communication   80% at phrase level   min A   increase intelligbility through awarenss of vocal intensity    Safety/Cognition/ Behavioral Observations  mod to max A for awareness, prob solving, memory   mod A   intellectual awareness, encourage use of strategies and external aids, orientation, and family education    Pain   No c/o pain   Remain pain free   Assess qshift and prn    Skin   Skin intact   Maintain skin integrity  Assess qshift and prn      Discharge Planning:  Discharging home with hospice on Friday. SW will confirm DME and conference and then update hospice company.   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    Patient on target to meet rehab goals: Currently needs max assist for upper body care and total assist for lower body care.  Needs max +2 for squat pivot due to posterior right lean  and pushing.  Maintained on D3 thin liquid diet with mod - max assist for cognition.    *See Care Plan and progress notes for long and short-term goals.   Revisions to Treatment Plan:  Downgraded goals Declined Hospice services   Teaching Needs: Safety, medications, skin care, diet modifications, transfers, toileting, etc.   Current Barriers to Discharge: Decreased caregiver support, Home enviroment access/layout, Incontinence, and Wound care  Possible Resolutions to Barriers: Family education HH follow up services DME: hoyer lift      Medical Summary Current Status: Lymphoma B cell with Onc consult and OP viwsit scheduled  Barriers to Discharge: Pending chemo/radiation   Possible Resolutions to Barriers/Weekly Focus: Oncology follow up, d/c planning   Continued Need for Acute Rehabilitation Level of Care: The patient requires daily medical management by a physician with specialized training in physical medicine and rehabilitation for the following reasons: Direction of a multidisciplinary physical rehabilitation program to maximize functional independence : Yes Medical management of patient stability for increased activity during participation in an intensive rehabilitation regime.: Yes Analysis of laboratory values and/or radiology reports with any subsequent need for medication adjustment and/or medical intervention. : Yes   I attest that I was present, lead the team conference, and concur with the assessment and plan of the team.   Chana Bode B 03/07/2023, 1:12 PM

## 2023-03-07 NOTE — Progress Notes (Signed)
PROGRESS NOTE   Subjective/Complaints:  Discussed rehab goals with pt.  Remains confused, he talked about palliative meeting as a "graduation with family" .  Appreciate palliative note    ROS-limited by cognition/behavior.  Objective:   No results found.  Recent Labs    03/05/23 0515  WBC 8.2  HGB 12.3*  HCT 38.8*  PLT 383    Recent Labs    03/05/23 0515  NA 136  K 3.7  CL 103  CO2 25  GLUCOSE 92  BUN 20  CREATININE 1.07  CALCIUM 9.7     Intake/Output Summary (Last 24 hours) at 03/07/2023 0804 Last data filed at 03/06/2023 1800 Gross per 24 hour  Intake 477 ml  Output --  Net 477 ml     Pressure Injury 09/19/21 Coccyx Unstageable - Full thickness tissue loss in which the base of the injury is covered by slough (yellow, tan, gray, green or Vandezande) and/or eschar (tan, Goldenstein or black) in the wound bed. oval shaped wound on coccyx with yello (Active)  09/19/21 1450  Location: Coccyx  Location Orientation:   Staging: Unstageable - Full thickness tissue loss in which the base of the injury is covered by slough (yellow, tan, gray, green or Durnil) and/or eschar (tan, Mckenna or black) in the wound bed.  Wound Description (Comments): oval shaped wound on coccyx with yellow slough  Present on Admission: Yes (Originally listed as stage 1 09/19/21; labeled as unstageable 02/22/23 on admit to CIR)    Physical Exam: Vital Signs Blood pressure 127/62, pulse 64, temperature 98 F (36.7 C), resp. rate 16, height 6' (1.829 m), weight 77 kg, SpO2 93%.   General: No acute distress Mood and affect are appropriate Heart: Regular rate and rhythm no rubs murmurs or extra sounds Lungs: Clear to auscultation, breathing unlabored, no rales or wheezes Abdomen: Positive bowel sounds, soft nontender to palpation, nondistended Extremities: No clubbing, cyanosis, or edema Skin: No evidence of breakdown, no evidence of  rash  Neurologic: Cranial nerves II through XII intact, motor strength is 5/5 in Left and 4/5 RIght deltoid, bicep, tricep, grip, 5/5 bilateral hip flexor, knee extensors, ankle dorsiflexor and plantar flexor Oriented to person but not time   Musculoskeletal: no pain with AROM in all 4 ext  No joint swelling    Assessment/Plan: 1. Functional deficits which require 3+ hours per day of interdisciplinary therapy in a comprehensive inpatient rehab setting. Physiatrist is providing close team supervision and 24 hour management of active medical problems listed below. Physiatrist and rehab team continue to assess barriers to discharge/monitor patient progress toward functional and medical goals  Care Tool:  Bathing    Body parts bathed by patient: Chest, Abdomen, Face, Right arm   Body parts bathed by helper: Left arm, Front perineal area, Buttocks, Right upper leg, Left lower leg, Right lower leg, Left upper leg     Bathing assist Assist Level: Maximal Assistance - Patient 24 - 49%     Upper Body Dressing/Undressing Upper body dressing   What is the patient wearing?: Pull over shirt    Upper body assist Assist Level: Moderate Assistance - Patient 50 - 74%    Lower  Body Dressing/Undressing Lower body dressing      What is the patient wearing?: Pants     Lower body assist Assist for lower body dressing: Maximal Assistance - Patient 25 - 49%     Toileting Toileting    Toileting assist Assist for toileting: 2 Helpers     Transfers Chair/bed transfer  Transfers assist  Chair/bed transfer activity did not occur: Safety/medical concerns (unsafe to get up)  Chair/bed transfer assist level: Total Assistance - Patient < 25%     Locomotion Ambulation   Ambulation assist   Ambulation activity did not occur: Safety/medical concerns  Assist level: Total Assistance - Patient < 25% Assistive device: Other (comment) (3 musketeer with WC follow) Max distance: 8   Walk 10  feet activity   Assist  Walk 10 feet activity did not occur: Safety/medical concerns        Walk 50 feet activity   Assist Walk 50 feet with 2 turns activity did not occur: Safety/medical concerns         Walk 150 feet activity   Assist Walk 150 feet activity did not occur: Safety/medical concerns         Walk 10 feet on uneven surface  activity   Assist Walk 10 feet on uneven surfaces activity did not occur: Safety/medical concerns         Wheelchair     Assist Is the patient using a wheelchair?: Yes Type of Wheelchair: Manual    Wheelchair assist level: Dependent - Patient 0%      Wheelchair 50 feet with 2 turns activity    Assist    Wheelchair 50 feet with 2 turns activity did not occur: Safety/medical concerns   Assist Level: Dependent - Patient 0%   Wheelchair 150 feet activity     Assist  Wheelchair 150 feet activity did not occur: Safety/medical concerns   Assist Level: Dependent - Patient 0%   Blood pressure 127/62, pulse 64, temperature 98 F (36.7 C), resp. rate 16, height 6' (1.829 m), weight 77 kg, SpO2 93%.  Medical Problem List and Plan: 1. 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 Formulized individual treatment plan based on medical history, underlying problem and comorbidities.            Con't CIR  2.  Antithrombotics: -DVT/anticoagulation:  Pharmaceutical: Lovenox             -antiplatelet therapy: DAPT 3. Pain Management: Tylenol prn. Gabapentin 100 mg TID was added on 11/28   RIght shoulder pain  hemiparetic side ,seems intermittent overall improved with strength 4. Mood/Behavior/Sleep:  LCSW to follow for  evaluation and support.              -antipsychotic agents: N/A--monitor for delirium/MS changes with recent stroke/change in environment insetting of baseline dementia. Started on gabapentin in acute care, no neurogenic pain reported will stop and monitor  5. Neuropsych/cognition: This patient may be intermittently capable of making decisions on his own behalf.             --family has noticed some disorientation and have been present 24/7 at [facility]. Unable  to do that here             --also [facility] and wears bilateral hearing aids. 6. Skin/Wound Care:  Routine pressure relief pressure.   7. Fluids/Electrolytes/Nutrition: Monitor I/O. Check CMET in am             --Monitor I/O. Lasix was d/c     Latest Ref Rng & Units 03/05/2023    5:15 AM 03/01/2023    5:09 AM 02/26/2023    5:32 AM  BMP  Glucose 70 - 99 mg/dL 92  87  92   BUN 8 - 23 mg/dL 20  23  22    Creatinine 0.61 - 1.24 mg/dL 1.61  0.96  0.45   Sodium 135 - 145 mmol/L 136  137  133   Potassium 3.5 - 5.1 mmol/L 3.7  4.0  3.7   Chloride 98 - 111 mmol/L 103  104  101   CO2 22 - 32 mmol/L 25  26  24    Calcium 8.9 - 10.3 mg/dL 9.7  9.6  9.1      8. CAD/Chronic diastolic CHF: Followed by Dr. Allyson Sabal. HH diet with strict I/O. Daily weights             --Most recent PCI 12/29/2022 followed by admissions for fluid overload Continue Jardiance , Lopressor , aldactone              --was on Lasix 40 mg BID PTA-->d/c on 12/04 for ??. Monitor for signs of overload  12/14- hasn't had weights checked for 3 days- will d/w nursing.  9. Chronic respiratory failure w/nocturnal hypoxia: Since Sept--sleep study negative for OSA             --currently oxygen dependent with activity and intermittently during hospitalization --Encourage pulmonary hygiene. Will check CXR to rule out overload.              --Oxygen prn during the day and continue at nights.    10. New diagnosis B-cell lymphoma: Developing multiple nodules for past 2 months- plan was for OP f/u  with oncology in [place] to set up f/u with  Dr Melvyn Neth in [place]              --just received bx reports yesterday-->called derm for path report             --question coagulopathy as cause of stroke. Will check dopplers          Per onc Non Hodgkins Lymphoma + comorbid conditions qualify pt for in home hospice.  Per Onc consult palliative care consult recommended  but not ordered . Will order 11/13     11. CKD 3a: Baseline SCr 1.4-1.5 per records             --monitor weekly  12. Hx of anemia of chronic disease:Hgb stable as well as B12            --continue iron supplement    Latest Ref Rng & Units 03/05/2023    5:15 AM 03/01/2023    5:09 AM 02/26/2023    5:32 AM  CBC  WBC 4.0 - 10.5 K/uL 8.2  7.4  7.6   Hemoglobin 13.0 - 17.0 g/dL 40.9  81.1  91.4   Hematocrit 39.0 - 52.0 % 38.8  38.4  37.8   Platelets 150 - 400 K/uL 383  351  350     13. Hx of vitamin B 12 deficiency: Continue high doses of B12  14. Chronic left knee pain: Monitor for symptoms with increase in activity/Right hemipareis  15. Hx major depressive d/o: continue zoloft.   16. Hx prostate cancer- sounds like could be indolent:Has urgency/nocturia. Toilet every 4 hours. Continue to check PVRs  17. Hx prediabetes: Hgb A 1c- 5.9 few months ago. Monitor fasting BS with serial labs.   18. Sore throat  12/14- pt doesn't want throat spray- can add if needed.   12/15- sore throat gone this AM       LOS: 13 days A FACE TO FACE EVALUATION WAS PERFORMED  Erick Colace 03/07/2023, 8:04 AM

## 2023-03-07 NOTE — Progress Notes (Signed)
Orthopedic Tech Progress Note Patient Details:  SRIKAR CONSOLO [birth year] [id number] Large size Resting WHO was delivered to beside.  Ortho Devices Type of Ortho Device: Velcro wrist splint Ortho Device/Splint Location: Right wrist Ortho Device/Splint Interventions: Ordered      Rozlynn Lippold E Sankalp Ferrell 03/07/2023, 11:04 AM

## 2023-03-07 NOTE — Progress Notes (Signed)
   Palliative Medicine Inpatient Follow Up Note HPI: Jeffrey Acevedo. Jeffrey Acevedo is a [name] with history of CAD, HTN, CKD 3a, chronic diastolic CHF, B-Cell Lymphoma, hearing loss, dementia, who was admitted to [facility] on 02/15/23 with reports of right sided weakness and inability hold fork or to feed himself.    The Palliative care team has been asked to support additional goals of care conversations.   Today's Discussion 03/07/2023  *Please note that this is a verbal dictation therefore any spelling or grammatical errors are due to the "Dragon Medical One" system interpretation.  Chart reviewed inclusive of vital signs, progress notes, laboratory results, and diagnostic images.   I met with Jeffrey Acevedo this afternoon. He and I discussed his plan moving forward inclusive of his hope that the oncology team in [place] may be able to offer him something to improve his lymphoma lesions.   We discussed how Jeffrey Acevedo fears his family caring for him in the position. I was able to offer him support as he has endured a dramatic life change due to this stroke. He is reasonably emotional as a result of this.   We discussed continuing with his faith as this is a source of strength for him. The goal at this time is for him to get home with his family on Friday.   Questions and concerns addressed/Palliative Support Provided.   Objective Assessment: Vital Signs Vitals:   03/07/23 0756 03/07/23 1255  BP: 127/62 136/79  Pulse: 64 (!) 58  Resp: 16 17  Temp: 98 F (36.7 C) 98.7 F (37.1 C)  SpO2: 93% 97%    Intake/Output Summary (Last 24 hours) at 03/07/2023 1447 Last data filed at 03/07/2023 1259 Gross per 24 hour  Intake 1200 ml  Output --  Net 1200 ml   Last Weight  Most recent update: 02/28/2023  4:01 AM    Weight  77 kg (169 lb 12.1 oz)            Gen: Elderly Caucasian male chronically ill-appearing HEENT: moist mucous membranes CV: Regular rate and rhythm  PULM: On room air breathing  is even and nonlabored ABD: soft/nontender  EXT: No edema  Neuro: Alert and oriented x2 --> hard of hearing  SUMMARY OF RECOMMENDATIONS   DNAR/DNI   Patients family met with [facility] yesterday --> Patient and his wife would like to meet with the [facility] oncologist, Dr. Melvyn Neth to see if there are any treatment options  Plan for patient to transition home on Friday --> DME per MSW team  PMT will peripherally follow from this point on  [id number]  ______________________________________________________________________________________ Lamarr Lulas [facility] Palliative Medicine Team Team Cell Phone: [phone] Please utilize secure chat with additional questions, if there is no response within 30 minutes please call the above phone number  Palliative Medicine Team providers are available by phone from 7am to 7pm daily and can be reached through the team cell phone.  Should this patient require assistance outside of these hours, please call the patient's attending physician.

## 2023-03-07 NOTE — Progress Notes (Addendum)
Physical Therapy Session Note  Patient Details  Name: Jeffrey Acevedo MRN: [id number] Date of Birth: 06/17/1940  Today's Date: 03/07/2023 PT Individual Time: 1410-1507 PT Individual Time Calculation (min): 57 min   Short Term Goals: Week 2:  PT Short Term Goal 1 (Week 2): Pt will perform sit<>stand transfers with overall Mod/ MaxA. PT Short Term Goal 2 (Week 2): Pt will maintain unsupported seated balance for more than 5 min with ModA. PT Short Term Goal 3 (Week 2): Pt will perform stand pivot transfers w/ Max A +1. PT Short Term Goal 4 (Week 2): Pt will ambulate at least 10 ft using LRAD with MaxA.  Skilled Therapeutic Interventions/Progress Updates: Patient sitting in WC on entrance to room. Patient alert and agreeable to PT session.   Patient reported no pain at beginning of session (or during). Pt with solemn tone regarding current presentation of needing to undergo treatment due to cancer. PTA consoled pt with active listening. Pt transported to ortho gym to perform standing tolerance with tech available, but pt agreed to wanting to perform therapy outside due to increase in temperature in weather, and pt not having been outside. Pt transported outside to [facility] dependently in WC. Pt performed sit to stand from [facility] with maxA + 1 (tech nearby for safety). Rehab tech on R of pt providing R HHA, and PTA on L providing L HHA. Pt with maxA to maintain standing + 2, but decreased to mod/heavy modA with pt presenting with decreased push on L UE vs previous session where pt required L UE to be around PTA shoulders to avoid heavy push. Pt cued to extend B knees with extended hips due to flexed posture. Pt unable to maintain longer than several seconds, and then fatigued and unable to follow commands to stand erect. Pt sat with minA for safety, and VC to control descent. Pt transported back to ortho gym to ambulate due to decrease in push this session vs other sessions. Pt with 3 musketeer method and WC  follow safety. Pt with maxA + 2 to ambulate 12'. Pt required increased time to initiate movement at first, but started to take steps after max cues to laterally lean accordingly to advance contralateral LE through swing. Pt also with max cues to extend hips and B knees (flexed throughout). PTA providing anterior hip translation accordingly with maxA. Pt with increasingly narrow BOS at first with VC to step with foot next to other, instead of attempting to step onto foot. Pt with shuffled steps and decreased length of step on R vs L. Pt also required target cues to step to in order to increase length. Pt required seated break due to needing to go to the bathroom for BM. Pt transported back to room in [facility] and transferred to toilet via STEDY with NT available to help. Pt voided in brief. Pt with maxA + 2 to change brief in STEDY with pt requiring VC throughout to maintain B UE on bar of STEDY (pt with minA to stand to stedy from [facility], and mod/heavy modA to stand from toilet, minA to control descent to either surfaces, including EOB at end of session). PT with heavy modA to advance B LE onto bed with VC to lean on L shoulder. Pt adjusted self in bed with VC to initiate with supervision and increased time.  Patient supine in bed at end of session with brakes locked, bed alarm set, and all needs within reach.      Therapy Documentation Precautions:  Precautions Precautions: Fall, Other (comment) Precaution Comments: strong posterior and pusher Restrictions Weight Bearing Restrictions Per Provider Order: No  Therapy/Group: Individual Therapy  Sajad Glander PTA 03/07/2023, 3:42 PM

## 2023-03-07 NOTE — Progress Notes (Signed)
Speech Language Pathology Daily Session Note  Patient Details  Name: Jeffrey Acevedo MRN: [id number] Date of Birth: [birth year]  Today's Date: 03/07/2023 SLP Individual Time: 0800-0859 SLP Individual Time Calculation (min): 59 min  Short Term Goals: Week 2: SLP Short Term Goal 1 (Week 2): STGs= LTGS due to ELOS  Skilled Therapeutic Interventions: Skilled therapy session focused on dysphagia and cognitive goals. Upon entrance, RN administering medications. SLP observed patient consume small pills whole in water. Patient required min cues, though able to clear pills from oral cavity. Remainder of large pills crushed in puree. Patient with occasional coughing, which may be due to bolus misdirection. Recommend small pills given whole in water per patient/family preference, and large pills crushed in puree. Continue standardized precautions and double swallows per MBS recommendations. SLP targeted cognitive goals through orientation and awareness tasks. Patient required maxA for orientation of time and understanding d/c date. SLP provided external aid with todays date and set d/c date. Patient able to identify medical situation (stroke, cancer) with minA, though quickly became tearful. Patient required encouragement from SLP and active listening. Patient left in bed with alarm set and call bell in reach. Continue POC  Pain None reported   Therapy/Group: Individual Therapy  Irelynn Schermerhorn M.A., CF-SLP 03/07/2023, 7:41 AM

## 2023-03-08 ENCOUNTER — Other Ambulatory Visit (HOSPITAL_COMMUNITY): Payer: Self-pay

## 2023-03-08 DIAGNOSIS — C8338 Diffuse large B-cell lymphoma, lymph nodes of multiple sites: Secondary | ICD-10-CM | POA: Diagnosis not present

## 2023-03-08 DIAGNOSIS — F01B4 Vascular dementia, moderate, with anxiety: Secondary | ICD-10-CM | POA: Diagnosis not present

## 2023-03-08 DIAGNOSIS — I639 Cerebral infarction, unspecified: Secondary | ICD-10-CM | POA: Diagnosis not present

## 2023-03-08 DIAGNOSIS — I509 Heart failure, unspecified: Secondary | ICD-10-CM | POA: Diagnosis not present

## 2023-03-08 LAB — BASIC METABOLIC PANEL
Anion gap: 7 (ref 5–15)
BUN: 19 mg/dL (ref 8–23)
CO2: 25 mmol/L (ref 22–32)
Calcium: 9.3 mg/dL (ref 8.9–10.3)
Chloride: 103 mmol/L (ref 98–111)
Creatinine, Ser: 1.06 mg/dL (ref 0.61–1.24)
GFR, Estimated: >60 mL/min (ref >60)
Glucose, Bld: 91 mg/dL (ref 70–99)
Potassium: 3.7 mmol/L (ref 3.5–5.1)
Sodium: 135 mmol/L (ref 135–145)

## 2023-03-08 LAB — CBC
HCT: 37 % — ABNORMAL LOW (ref 39.0–52.0)
Hemoglobin: 11.8 g/dL — ABNORMAL LOW (ref 13.0–17.0)
MCH: 26.9 pg (ref 26.0–34.0)
MCHC: 31.9 g/dL (ref 30.0–36.0)
MCV: 84.3 fL (ref 80.0–100.0)
Platelets: 349 10*3/uL (ref 150–400)
RBC: 4.39 MIL/uL (ref 4.22–5.81)
RDW: 15.3 % (ref 11.5–15.5)
WBC: 9 10*3/uL (ref 4.0–10.5)
nRBC: 0 % (ref 0.0–0.2)

## 2023-03-08 MED ORDER — POLYETHYLENE GLYCOL 3350 17 GM/SCOOP PO POWD
17.0000 g | Freq: Every day | ORAL | 0 refills | Status: DC
  Filled 2023-03-08: qty 238, 14d supply, fill #0

## 2023-03-08 MED ORDER — MEDIHONEY WOUND/BURN DRESSING EX PSTE
1.0000 | PASTE | Freq: Every day | CUTANEOUS | 0 refills | Status: DC
  Filled 2023-03-08: qty 44, 44d supply, fill #0

## 2023-03-08 MED ORDER — FENOFIBRATE 40 MG PO TABS
80.0000 mg | ORAL_TABLET | Freq: Every day | ORAL | 0 refills | Status: DC
  Filled 2023-03-08: qty 60, 30d supply, fill #0

## 2023-03-08 MED ORDER — ATORVASTATIN CALCIUM 40 MG PO TABS
40.0000 mg | ORAL_TABLET | Freq: Every day | ORAL | 0 refills | Status: DC
  Filled 2023-03-08: qty 30, 30d supply, fill #0

## 2023-03-08 MED ORDER — DICLOFENAC SODIUM 1 % EX GEL
2.0000 g | Freq: Four times a day (QID) | CUTANEOUS | 0 refills | Status: DC
  Filled 2023-03-08: qty 100, 13d supply, fill #0

## 2023-03-08 MED ORDER — SPIRONOLACTONE 25 MG PO TABS
12.5000 mg | ORAL_TABLET | Freq: Every day | ORAL | 0 refills | Status: DC
  Filled 2023-03-08: qty 15, 30d supply, fill #0

## 2023-03-08 MED ORDER — ASPIRIN 81 MG PO TBEC
81.0000 mg | DELAYED_RELEASE_TABLET | Freq: Every day | ORAL | 0 refills | Status: DC
  Filled 2023-03-08: qty 90, 90d supply, fill #0

## 2023-03-08 MED ORDER — SERTRALINE HCL 25 MG PO TABS
25.0000 mg | ORAL_TABLET | Freq: Every day | ORAL | 0 refills | Status: DC
  Filled 2023-03-08: qty 30, 30d supply, fill #0

## 2023-03-08 MED ORDER — ADULT MULTIVITAMIN W/MINERALS CH
1.0000 | ORAL_TABLET | Freq: Every day | ORAL | 0 refills | Status: DC
  Filled 2023-03-08: qty 30, 30d supply, fill #0

## 2023-03-08 MED ORDER — CLOPIDOGREL BISULFATE 75 MG PO TABS
75.0000 mg | ORAL_TABLET | Freq: Every day | ORAL | 0 refills | Status: DC
  Filled 2023-03-08 – 2023-05-25 (×2): qty 30, 30d supply, fill #0

## 2023-03-08 MED ORDER — POLYSACCHARIDE IRON COMPLEX 150 MG PO CAPS
150.0000 mg | ORAL_CAPSULE | Freq: Every day | ORAL | 0 refills | Status: DC
  Filled 2023-03-08: qty 30, 30d supply, fill #0

## 2023-03-08 MED ORDER — SALINE SPRAY 0.65 % NA SOLN
1.0000 | Freq: Two times a day (BID) | NASAL | 0 refills | Status: DC
  Filled 2023-03-08: qty 88, 20d supply, fill #0

## 2023-03-08 NOTE — Progress Notes (Signed)
Physical Therapy Discharge Summary  Patient Details  Name: Jeffrey Acevedo MRN: [id number] Date of Birth: 1941/01/02  Date of Discharge from PT service:March 08, 2023  Today's Date: 03/08/2023   Patient has met 4 of 9 long term goals due to min to mod progression noted in areas of improved balance, improved postural control, increased strength, improved attention, improved awareness, and improved coordination.  Patient to discharge at a wheelchair level Max Assist.  Improvements have been minimal. Patient's care partner is independent to provide the necessary physical and cognitive assistance at discharge.  Reasons goals not met: Pt and family with decision to initiate treatment for recently diagnosed cancer. Did not meet recommended length of stay for IPR.   Recommendation:  Patient will benefit from ongoing skilled PT services in home health setting to continue to advance safe functional mobility, address ongoing impairments in strength, coordination, balance, activity tolerance, cognition, safety awareness, and to minimize fall risk.  Equipment: Hospital bed, w/c  Reasons for discharge: treatment goals met, discharge from hospital, and family/ pt decision to pursue cancer treatment.  Patient/family agrees with progress made and goals achieved: Yes  PT Discharge Precautions/Restrictions Precautions Precautions: Fall;Other (comment) Precaution Comments: strong posterior and pusher that is decreased since evaluation, but still present Restrictions Weight Bearing Restrictions Per Provider Order: No Vital Signs   Pain Pain Assessment Pain Scale: 0-10 Pain Score: 0-No pain Pain Interference Pain Interference Pain Effect on Sleep: 1. Rarely or not at all Pain Interference with Therapy Activities: 1. Rarely or not at all Pain Interference with Day-to-Day Activities: 1. Rarely or not at all Vision/Perception  Vision - History Ability to See in Adequate Light: 1  Impaired Vision - Assessment Additional Comments: suspect at least R inattention/midline shift based on observations Perception Perception: Impaired Preception Impairment Details: Inattention/Neglect;Body Scheme;Spatial orientation Perception-Other Comments: pt commented " I just cant get my left and right hands to work together." he demonstrated spatial deficits with reaching when using L hand Praxis Praxis: Impaired Praxis Impairment Details: Motor planning;Limb apraxia Praxis-Other Comments: pt has active movement of RUE (fair grasp and sh AROM to 100 but had great difficulty integrating it into function), LUE overreached and demonstrated incoordination  Cognition Overall Cognitive Status: Impaired/Different from baseline Arousal/Alertness: Awake/alert Orientation Level: Oriented to person;Oriented to place;Oriented to situation Focused Attention: Impaired Memory: Impaired Memory Impairment: Storage deficit;Retrieval deficit;Decreased short term memory;Decreased long term memory Awareness: Impaired Awareness Impairment: Intellectual impairment Problem Solving: Impaired Problem Solving Impairment: Verbal basic Safety/Judgment: Impaired Sensation Sensation Light Touch: Appears Intact Hot/Cold: Not tested Proprioception: Impaired by gross assessment Stereognosis: Impaired by gross assessment Coordination Gross Motor Movements are Fluid and Coordinated: No Fine Motor Movements are Fluid and Coordinated: No Motor  Motor Motor: Hemiplegia;Motor apraxia Motor - Discharge Observations: Pt with slow to initate movement/motor planning required musculature recruitment (pushing vs pulling on B UE/LE)  Mobility Bed Mobility Bed Mobility: Rolling Left;Rolling Right;Left Sidelying to Sit;Supine to Sit;Sit to Supine;Sitting - Scoot to [place] of Bed Rolling Right: Supervision Rolling Left: Supervision/ CGA Left Sidelying to Sit: Minimal Assistance - Patient > 75% Supine to Sit: Moderate  Assistance - Patient 50-74% Sitting - Scoot to Edge of Bed: Maximal Assistance - Patient 25-49% Sit to Supine: Moderate Assistance - Patient 50-74% Transfers: Sit to Stand;Stand to Sit;Squat Pivot Transfers Sit to Stand: Moderate Assistance - Patient 50-74% Stand to Sit: Minimal Assistance - Patient > 75% Stand Pivot Transfers: Maximal Assistance - Patient 25 - 49% Stand Pivot Transfer Details: Visual cues/gestures for sequencing;Verbal  cues for sequencing;Verbal cues for technique;Tactile cues for weight shifting Squat Pivot Transfers: Maximal Assistance - Patient 25-49% Transfer via Lift Equipment: Stedy (minA to stand to [employer]) Locomotion  Gait Ambulation: Yes Gait Assistance: Maximal Assistance - Patient 25-49% Gait Distance (Feet): 50 Feet Assistive device: Other (Comment) (3 musketeer) Gait Assistance Details: Tactile cues for initiation;Verbal cues for sequencing;Verbal cues for gait pattern;Verbal cues for technique Gait Gait: Yes Gait Pattern: Impaired Gait Pattern: Decreased step length - right;Decreased step length - left;Decreased stance time - right;Decreased stance time - left;Decreased stride length;Decreased hip/knee flexion - right;Decreased hip/knee flexion - left;Poor foot clearance - left;Poor foot clearance - right;Narrow base of support Gait velocity: Decreased Stairs / Additional Locomotion Stairs: No Wheelchair Mobility Wheelchair Mobility: Yes Wheelchair Assistance: Total Assistance - Patient <25%  Trunk/Postural Assessment  Cervical Assessment Cervical Assessment: Within Functional Limits Thoracic Assessment Thoracic Assessment: Within Functional Limits Lumbar Assessment Lumbar Assessment: Exceptions to [facility] (posterior pelvic tilt) Postural Control Postural Control: Deficits on evaluation Trunk Control: pt leans to left corrects w/ cueing, but states "it feels like I'm falling to R" when in midline.  Balance Balance Balance Assessed: Yes Standardized  Balance Assessment Standardized Balance Assessment: PASS Postural Assessment Scale for Stroke Patients=PASS 1. Sitting Without Support: Can sit with slight support (for example, by 1 hand) 2. Standing With Support: Can stand with moderate support of 1 person 3. Standing Without Support: Cannot stand without support 4.Standing on Nonparetic Leg: Cannot stand on nonparetic leg 5.Standing on Paretic Leg: Cannot stand on paretic leg MAINTAINING POSTURE SUBTOTAL: 3 6. Supine to Paretic Side Lateral: Can perform with little help 7. Supine to Nonparetic Side Lateral: Can perform with little help 8. Supine to Sitting Up on the Edge of the Mat: Can perform with much help 9. Sitting on the Edge of the Mat to Supine: Can perform with much help 10. Sitting to Standing Up: Can perform with much help 11. Standing Up to Sitting Down: Can perform with much help 12. Standing,Picking Up a Pencil from the Floor: Cannot perform CHANGING POSTURE SUBTOTAL: 8 PASS TOTAL SCORE: 11 Static Sitting Balance Static Sitting - Balance Support: Feet supported;Bilateral upper extremity supported Static Sitting - Level of Assistance: 4: Min assist Dynamic Sitting Balance Dynamic Sitting - Balance Support: Feet supported Dynamic Sitting - Level of Assistance: 3: Mod assist Static Standing Balance Static Standing - Balance Support: Bilateral upper extremity supported Static Standing - Level of Assistance: 3: Mod assist Dynamic Standing Balance Dynamic Standing - Balance Support: Bilateral upper extremity supported Dynamic Standing - Level of Assistance: 2: Max assist Extremity Assessment  RUE Assessment RUE Assessment: Exceptions to [facility] Active Range of Motion (AROM) Comments: shoulder flexion to 90, limited AROM of horizontal add to reach R arm General Strength Comments: gross grasp 4-/5 RUE Body System: Neuro Brunstrum levels for arm and hand: Arm;Hand Brunstrum level for arm: Stage V Relative Independence from  Synergy Brunstrum level for hand: Stage V Independence from basic synergies LUE Assessment LUE Assessment: Within Functional Limits RLE Assessment RLE Assessment: Exceptions to [facility] General Strength Comments: grossly at least 3+/5 LLE Assessment LLE Assessment: Within Functional Limits   Dominic Sandoval PTA 03/08/2023, 4:02 PM  Loel Dubonnet PT, DPT, CSRS 03/12/2023, 10:30AM

## 2023-03-08 NOTE — Progress Notes (Signed)
Patient ID: Jeffrey Acevedo, male   DOB: 23-Aug-1940, 82 y.o.   MRN: [id number]  Updated delivery address for DME: Updated delivery address: [address] [place], [place] [address]

## 2023-03-08 NOTE — Progress Notes (Addendum)
Patient ID: Jeffrey Acevedo, male   DOB: [birth year], 82 y.o.   MRN: [id number]  [facility] referral sent to [facility].   10:55 AM: Patient approved for Pt/OT/Aide

## 2023-03-08 NOTE — Progress Notes (Signed)
Speech Language Pathology Discharge Summary  Patient Details  Name: Jeffrey Acevedo MRN: [id number] Date of Birth: [birth year]  Date of Discharge from SLP service:March 08, 2023  Today's Date: 03/08/2023 SLP Individual Time: 4098-1191 SLP Individual Time Calculation (min): 28 min   Skilled Therapeutic Interventions:   Skilled therapy session focused on family education. SLP educated family on patients MBS results and explained basic oropharyngeal anatomy and physiology. Family educated on recommended diet (dysphagia 3/thin) and need for double swallows to clear post prandial posterior spillage. SLP provided ideas regarding creation of orientation external aids to reduce confusion. Family verbalized understanding of all education provided with no further questions.    Patient has met 5 of 6 long term goals.  Patient to discharge at overall Mod;Min level.  Reasons goals not met: continues to require up to max cues for problem solving   Clinical Impression/Discharge Summary:  Pt has made fair gains and has met 5 of 6 LTG's this admission due to improved expression, dysphagia and cognition. Pt is currently an overall modA for cognitive tasks including biographical memory, awareness of medication situation and attention. Patient did not meet problem solving goal this admission due to continued need for up to maxA and waxing and waning cognitive status. Patient requires min cues for utilization of swallowing compensatory strategies to minimize overt s/sx of aspiration with D3/thin diet. Patient with increased speech intelligibility this admission requiring minA to utilize strategies in order to increase intelligibility to 90%. Pt/family education complete and pt will discharge home with 24 hour supervision from friends/family/etc. Pt would benefit from [facility] f/u ST services to maximize dysphagia and cognition in order to maximize functional independence.    Care Partner:  Caregiver Able to Provide  Assistance: Yes  Type of Caregiver Assistance: Physical;Cognitive  Recommendation:  Outpatient SLP;Home Health SLP;24 hour supervision/assistance  Rationale for SLP Follow Up: Maximize cognitive function and independence;Reduce caregiver burden   Equipment: n/a  Reasons for discharge: Discharged from hospital   Patient/Family Agrees with Progress Made and Goals Achieved: Yes    Gerhard Rappaport M.A., CF-SLP 03/08/2023, 3:47 PM

## 2023-03-08 NOTE — Progress Notes (Signed)
Occupational Therapy Discharge Summary  Patient Details  Name: Jeffrey Acevedo MRN: [id number] Date of Birth: [birth year]  Date of Discharge from OT service:March 08, 2023  Today's Date: 03/08/2023  Patient has met 11 of 11 long term goals due to improved activity tolerance, improved balance, postural control, improved attention, improved awareness, and improved coordination.  Pt currently requires MODA for bathing from roll in shower chair, MODA for UB ADLS and MAX A for LB ADLs. Pts family have completed multiple sessions of family education with family demonstrating proficiency with transfers using the stedy as well as squat pivot transfers. Family plans to with Patient to discharge at [facility] Max Assist level.  Patient's care partner is independent to provide the necessary physical and cognitive assistance at discharge.   Family education completed with wife and children.   Reasons goals not met: d/t pts inconsistencies pt is safest with 2 people at home, standing goal and toileting goal are both adequate for DC as family will use stedy to stand pt and family will use stedy for toileting needs and +2 assist for safety.   Recommendation:  Patient will benefit from ongoing skilled OT services in home health setting to continue to advance functional skills in the area of BADL.  Equipment: BSC  Reasons for discharge: treatment goals met  Patient/family agrees with progress made and goals achieved: Yes  OT Discharge Precautions/Restrictions   fall   ADL ADL Eating: Set up Where Assessed-Eating: Chair Grooming: Setup Where Assessed-Grooming: Wheelchair Upper Body Bathing: Moderate assistance Where Assessed-Upper Body Bathing: Shower Lower Body Bathing: Maximal assistance Where Assessed-Lower Body Bathing: Shower Upper Body Dressing: Moderate assistance Where Assessed-Upper Body Dressing: Chair Lower Body Dressing: Maximal assistance Where Assessed-Lower Body Dressing:  Chair Toileting: Maximal assistance Where Assessed-Toileting: Bedside Commode Toilet Transfer: Moderate assistance Toilet Transfer Method: Ambulance person: Psychiatric nurse: Moderate assistance Film/video editor Method: Administrator: Sales promotion account executive Baseline Vision/History: 1 Wears glasses Patient Visual Report: Other (comment) (unable to fully state visual changes d/ t cog but overall WFL during functional tasks) Additional Comments: suspect at least R inattention/midline shift based on observations Perception  Perception: Impaired Perception-Other Comments: pt commented " I just cant get my left and right hands to work together." he demonstrated spatial deficits with reaching when using L hand Praxis Praxis: Impaired Praxis Impairment Details: Motor planning;Limb apraxia Praxis-Other Comments: pt has active movement of RUE (fair grasp and sh AROM to 100 but had great difficulty integrating it into function), LUE overreached and demonstrated incoordination Cognition Brief Interview for Mental Status (BIMS) Repetition of Three Words (First Attempt): 3 Temporal Orientation: Year: Missed by more than 5 years Temporal Orientation: Month: Accurate within 5 days Temporal Orientation: Day: Incorrect Recall: "Sock": Yes, no cue required Recall: "Blue": Yes, no cue required Recall: "Bed": No, could not recall BIMS Summary Score: 9 Sensation Sensation Light Touch: Appears Intact Hot/Cold: Not tested Proprioception: Impaired by gross assessment Stereognosis: Impaired by gross assessment Coordination Gross Motor Movements are Fluid and Coordinated: No Fine Motor Movements are Fluid and Coordinated: No Motor  Motor Motor: Hemiplegia;Motor apraxia Motor - Discharge Observations: Pt with slow to initate movement/motor planning required musculature recruitment (pushing vs pulling on B UE/LE) Mobility   Bed Mobility Bed Mobility: Rolling Left;Rolling Right;Left Sidelying to Sit;Supine to Sit;Sit to Supine;Sitting - Scoot to Edge of Bed Rolling Right: Minimal Assistance - Patient > 75% Rolling Left: Minimal Assistance - Patient > 75% Left Sidelying  to Sit: Moderate Assistance - Patient 50-74% Supine to Sit: Moderate Assistance - Patient 50-74%;Maximal Assistance - Patient - Patient 25-49% Sitting - Scoot to Edge of Bed: Maximal Assistance - Patient 25-49% Sit to Supine: Moderate Assistance - Patient 50-74%;Maximal Assistance - Patient 25-49% Transfers Sit to Stand: Moderate Assistance - Patient 50-74% Stand to Sit: Minimal Assistance - Patient > 75%  Trunk/Postural Assessment  Cervical Assessment Cervical Assessment: Within Functional Limits Thoracic Assessment Thoracic Assessment: Within Functional Limits Lumbar Assessment Lumbar Assessment: Exceptions to [facility] (posterior pelvic tilt) Postural Control Postural Control: Deficits on evaluation Trunk Control: pt leans to left corrects w/ cueing, but states "it feels like I'm falling to R" when in midline.  Balance Static Sitting Balance Static Sitting - Balance Support: Feet supported;Bilateral upper extremity supported Static Sitting - Level of Assistance: 4: Min assist Dynamic Sitting Balance Dynamic Sitting - Balance Support: Feet supported Dynamic Sitting - Level of Assistance: 3: Mod assist Static Standing Balance Static Standing - Balance Support: Bilateral upper extremity supported Static Standing - Level of Assistance: 3: Mod assist Dynamic Standing Balance Dynamic Standing - Balance Support: Bilateral upper extremity supported Dynamic Standing - Level of Assistance: 2: Max assist Extremity/Trunk Assessment RUE Assessment RUE Assessment: Exceptions to [facility] Active Range of Motion (AROM) Comments: shoulder flexion to 90, limited AROM of horizontal add to reach R arm General Strength Comments: gross grasp 4-/5 RUE Body  System: Neuro Brunstrum levels for arm and hand: Arm;Hand Brunstrum level for arm: Stage V Relative Independence from Synergy Brunstrum level for hand: Stage V Independence from basic synergies LUE Assessment LUE Assessment: Within Functional Limits   Barron Schmid 03/08/2023, 9:30 AM

## 2023-03-08 NOTE — Progress Notes (Signed)
PROGRESS NOTE   Subjective/Complaints:   Appreciate palliative note, pt unable to recall new information Oriented to person only    ROS-limited by cognition/behavior.  Objective:   No results found.  Recent Labs    03/08/23 0519  WBC 9.0  HGB 11.8*  HCT 37.0*  PLT 349    Recent Labs    03/08/23 0519  NA 135  K 3.7  CL 103  CO2 25  GLUCOSE 91  BUN 19  CREATININE 1.06  CALCIUM 9.3     Intake/Output Summary (Last 24 hours) at 03/08/2023 0804 Last data filed at 03/07/2023 1800 Gross per 24 hour  Intake 1200 ml  Output --  Net 1200 ml     Pressure Injury 09/19/21 Coccyx Unstageable - Full thickness tissue loss in which the base of the injury is covered by slough (yellow, tan, gray, green or Lish) and/or eschar (tan, Fitting or black) in the wound bed. oval shaped wound on coccyx with yello (Active)  09/19/21 1450  Location: Coccyx  Location Orientation:   Staging: Unstageable - Full thickness tissue loss in which the base of the injury is covered by slough (yellow, tan, gray, green or Hitchcock) and/or eschar (tan, Decelles or black) in the wound bed.  Wound Description (Comments): oval shaped wound on coccyx with yellow slough  Present on Admission: Yes (Originally listed as stage 1 09/19/21; labeled as unstageable 02/22/23 on admit to CIR)    Physical Exam: Vital Signs Blood pressure 121/74, pulse 66, temperature 97.9 F (36.6 C), resp. rate 17, height 6' (1.829 m), weight 77 kg, SpO2 92%.   General: No acute distress Mood and affect are appropriate Heart: Regular rate and rhythm no rubs murmurs or extra sounds Lungs: Clear to auscultation, breathing unlabored, no rales or wheezes Abdomen: Positive bowel sounds, soft nontender to palpation, nondistended Extremities: No clubbing, cyanosis, or edema Skin: No evidence of breakdown, no evidence of rash  Neurologic: Cranial nerves II through XII intact, motor  strength is 5/5 in Left and 4/5 RIght deltoid, bicep, tricep, grip, 5/5 bilateral hip flexor, knee extensors, ankle dorsiflexor and plantar flexor Oriented to person but not time or place  Musculoskeletal: no pain with AROM in all 4 ext  No joint swelling    Assessment/Plan: 1. Functional deficits which require 3+ hours per day of interdisciplinary therapy in a comprehensive inpatient rehab setting. Physiatrist is providing close team supervision and 24 hour management of active medical problems listed below. Physiatrist and rehab team continue to assess barriers to discharge/monitor patient progress toward functional and medical goals  Care Tool:  Bathing    Body parts bathed by patient: Chest, Abdomen, Face, Right arm   Body parts bathed by helper: Left arm, Front perineal area, Buttocks, Right upper leg, Left lower leg, Right lower leg, Left upper leg     Bathing assist Assist Level: Maximal Assistance - Patient 24 - 49%     Upper Body Dressing/Undressing Upper body dressing   What is the patient wearing?: Pull over shirt    Upper body assist Assist Level: Moderate Assistance - Patient 50 - 74%    Lower Body Dressing/Undressing Lower body dressing  What is the patient wearing?: Pants     Lower body assist Assist for lower body dressing: Maximal Assistance - Patient 25 - 49%     Toileting Toileting    Toileting assist Assist for toileting: 2 Helpers     Transfers Chair/bed transfer  Transfers assist  Chair/bed transfer activity did not occur: Safety/medical concerns (unsafe to get up)  Chair/bed transfer assist level: Total Assistance - Patient < 25%     Locomotion Ambulation   Ambulation assist   Ambulation activity did not occur: Safety/medical concerns  Assist level: Total Assistance - Patient < 25% Assistive device: Other (comment) (3 musketeer with WC follow) Max distance: 8   Walk 10 feet activity   Assist  Walk 10 feet activity did  not occur: Safety/medical concerns        Walk 50 feet activity   Assist Walk 50 feet with 2 turns activity did not occur: Safety/medical concerns         Walk 150 feet activity   Assist Walk 150 feet activity did not occur: Safety/medical concerns         Walk 10 feet on uneven surface  activity   Assist Walk 10 feet on uneven surfaces activity did not occur: Safety/medical concerns         Wheelchair     Assist Is the patient using a wheelchair?: Yes Type of Wheelchair: Manual    Wheelchair assist level: Dependent - Patient 0%      Wheelchair 50 feet with 2 turns activity    Assist    Wheelchair 50 feet with 2 turns activity did not occur: Safety/medical concerns   Assist Level: Dependent - Patient 0%   Wheelchair 150 feet activity     Assist  Wheelchair 150 feet activity did not occur: Safety/medical concerns   Assist Level: Dependent - Patient 0%   Blood pressure 121/74, pulse 66, temperature 97.9 F (36.6 C), resp. rate 17, height 6' (1.829 m), weight 77 kg, SpO2 92%.  Medical Problem List and Plan: 1. 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  Antithrombotics: -DVT/anticoagulation:  Pharmaceutical: Lovenox             -antiplatelet therapy: DAPT 3. Pain Management: Tylenol prn. Gabapentin 100 mg TID was added on 11/28   RIght shoulder pain  hemiparetic side ,seems intermittent overall improved with strength 4. Mood/Behavior/Sleep:  LCSW to follow for evaluation and support.              -antipsychotic agents: N/A--monitor for delirium/MS changes with  recent stroke/change in environment insetting of baseline dementia. Started on gabapentin in acute care, no neurogenic pain reported will stop and monitor  5. Neuropsych/cognition: This patient may be intermittently capable of making decisions on his own behalf.             --family has noticed some disorientation and have been present 24/7 at [facility]. Unable to do that here             --  also HOH and wears bilateral hearing aids. 6. Skin/Wound Care:  Routine pressure relief pressure.   7. Fluids/Electrolytes/Nutrition: Monitor I/O. Check CMET in am             --Monitor I/O. Lasix was d/c     Latest Ref Rng & Units 03/08/2023    5:19 AM 03/05/2023    5:15 AM 03/01/2023    5:09 AM  BMP  Glucose 70 - 99 mg/dL 91  92  87   BUN 8 - 23 mg/dL 19  20  23    Creatinine 0.61 - 1.24 mg/dL 4.13  2.44  0.10   Sodium 135 - 145 mmol/L 135  136  137   Potassium 3.5 - 5.1 mmol/L 3.7  3.7  4.0   Chloride 98 - 111 mmol/L 103  103  104   CO2 22 - 32 mmol/L 25  25  26    Calcium 8.9 - 10.3 mg/dL 9.3  9.7  9.6      8. CAD/Chronic diastolic CHF: Followed by Dr. Allyson Sabal. HH diet with strict I/O. Daily weights             --Most recent PCI 12/29/2022 followed by admissions for fluid overload Continue Jardiance , Lopressor , aldactone              --was on Lasix 40 mg BID PTA-->d/c on 12/04 for ??. Monitor for signs of overload  12/14- hasn't had weights checked for 3 days- will d/w nursing.  9. Chronic respiratory failure w/nocturnal hypoxia: Since Sept--sleep study negative for OSA             --currently oxygen dependent with activity and intermittently during hospitalization --Encourage pulmonary hygiene. Will check CXR to rule out overload.              --Oxygen prn during the day and continue at nights.    10. New diagnosis B-cell lymphoma: Developing multiple nodules for past 2 months- plan was for OP f/u with oncology in ,[place] [facility] to set up f/u with  Dr Melvyn Neth in [place]              --just  received bx reports yesterday-->called derm for path report             --question coagulopathy as cause of stroke. Will check dopplers          Per onc Non Hodgkins Lymphoma + comorbid conditions qualify pt for in home hospice.  Per Onc consult palliative care consult recommended  but not ordered . Will order 11/13     11. CKD 3a: Baseline SCr 1.4-1.5 per records             --monitor weekly  12. Hx of anemia of chronic disease:Hgb stable as well as B12            --continue iron supplement    Latest Ref Rng & Units 03/08/2023    5:19 AM 03/05/2023    5:15 AM 03/01/2023    5:09 AM  CBC  WBC 4.0 - 10.5 K/uL 9.0  8.2  7.4   Hemoglobin 13.0 - 17.0 g/dL 27.2  53.6  64.4   Hematocrit 39.0 - 52.0 % 37.0  38.8  38.4   Platelets 150 - 400 K/uL 349  383  351     13. Hx of vitamin B 12 deficiency: Continue high doses of B12   14. Chronic left knee pain: Monitor for symptoms with increase in activity/Right hemipareis  15. Hx major depressive d/o: continue zoloft.   16. Hx prostate cancer- sounds like could be indolent:Has urgency/nocturia. Toilet every 4 hours. Continue to check PVRs  17. Hx prediabetes: Hgb A 1c- 5.9 few months ago. Monitor fasting BS with serial labs.   18. Sore throat  12/14- pt doesn't want throat spray- can add if needed.   12/15- sore throat gone this AM       LOS: 14 days A FACE TO FACE EVALUATION WAS PERFORMED  Erick Colace 03/08/2023, 8:04 AM

## 2023-03-08 NOTE — Progress Notes (Signed)
Physical Therapy Session Note  Patient Details  Name: Jeffrey Acevedo MRN: [id number] Date of Birth: 12-30-1940  Today's Date: 03/08/2023 PT Individual Time: 1402-1505 PT Individual Time Calculation (min): 63 min   Short Term Goals: Week 2:  PT Short Term Goal 1 (Week 2): Pt will perform sit<>stand transfers with overall Mod/ MaxA. PT Short Term Goal 2 (Week 2): Pt will maintain unsupported seated balance for more than 5 min with ModA. PT Short Term Goal 3 (Week 2): Pt will perform stand pivot transfers w/ Max A +1. PT Short Term Goal 4 (Week 2): Pt will ambulate at least 10 ft using LRAD with MaxA.  Skilled Therapeutic Interventions/Progress Updates: Patient sitting in [facility] with family present and OT handoff in ortho gym. Patient alert and agreeable to PT session.   Patient reported no pain this session, but did state feeling fatigued from therapy sessions earlier in day. Further family education provided this day with PTA building rapport with pt's family pertaining to pt's LOA, various transfers that they have practiced and still need to practice to feel better equipped, whom will be providing assistance at home other than pt's wife due to pt's wife not being able to assist as much. Pt will need + 2 for transfers via squat pivot (attempted slide board when back in room, but pt too fatigued to assess level of assistance, and could not initiate movement to specified cuing). Pt family reported STEDY will arrive at the end of December. Pt's hospital bed is arriving today. Pt family wanted to work on squat pivot transfer to EOB from [facility], as well as bed mobility. Pt transported back to room in [facility] and performed squat pivot to the L from WC to EOB with maxA (PTA performed at first to show family sequence and body mechanics). Pt required increased VC to initiate movement, and to anteriorly lean/scoot to edge with added resistance (pt attempting to stand vs scooting and required demonstration of movement). Pt  family educated on head/hip relationship, B LE placement, L UE placement on lap in supination to avoid pt pushing, and to transfer pt with one in front of pt, and the other behind between [facility] and EOB with hands on ischial tuberosities to assist with pivoting over. Two of pt's family members performed pivot from [facility] to EOB with with stated sequence, and PTA with CGA for safety. Pt on EOB, and required boost up to avoid sliding off of air mattress. PTA used moment to show family how to manipulate body to scoot pt posteriorly if this happens at home with lowering hips towards ground while having pt's B knees between the person transferring knees to push onto bed. PTA performed to show, and then PTA placed pt back closer to EOB with 2 of pt's family members performing scoot with no issue. Pt with increased fatigue and was placed supine in bed for safety (pt with increased confusion on tasks cued to do). Pt returned to supine from EOB with maxA, and PTA providing education along the way with hand placement and cuing. PTA also addressed how to get out of bed with hand placement/LE placement to assist with bridging hips to sidelying. Pt family also educated on use of HOB elevation mode on hospital bed home, as well as railing to assist with pt pulling self over. Pt family with final question on if pt were to end up on the floor during transfer. Pt family told to move bed (if able - if not, place sheet under  pt to slide pt to open flooring) and to use hoyer lift that was previously performed in past family ed.  Pt family further educated that on day's when pt requires increased assistance due to fatigue to use hoyer or extra help with transferring.   Patient supine in bed with SLP hand off in room.       Therapy Documentation Precautions:  Precautions Precautions: Fall, Other (comment) Precaution Comments: strong posterior and pusher Restrictions Weight Bearing Restrictions Per Provider Order:  No  Therapy/Group: Individual Therapy  Aubrionna Istre PTA 03/08/2023, 3:33 PM

## 2023-03-08 NOTE — Progress Notes (Signed)
Inpatient Rehabilitation Care Coordinator Discharge Note   Patient Details  Name: Jeffrey Acevedo MRN: [id number] Date of Birth: December 17, 1940   Discharge location: Home  Length of Stay: 15 Days  Discharge activity level: Max/Total A  Home/community participation: Spouse, Children and family  Patient response OZ:DGUYQI Literacy - How often do you need to have someone help you when you read instructions, pamphlets, or other written material from your doctor or pharmacy?: Always  Patient response HK:[id number] Isolation - How often do you feel lonely or isolated from those around you?: Never  Services provided included: MD, RD, PT, OT, SLP, RN, TR, CM, Pharmacy, Neuropsych, SW  Financial Services:  Field seismologist Utilized: Private Insurance [employer]  Choices offered to/list presented to: Spouse and children  Follow-up services arranged:  Home Health, DME Home Health Agency: [place] [facility] OT AIDE    DME : Hospital Bed, Lift, Bedside Commode, tub Transfer Bench, Wheelchair and Lap Tray    Patient response to transportation need: Is the patient able to respond to transportation needs?: Yes In the past 12 months, has lack of transportation kept you from medical appointments or from getting medications?: No In the past 12 months, has lack of transportation kept you from meetings, work, or from getting things needed for daily living?: No   Patient/Family verbalized understanding of follow-up arrangements:  Yes  Individual responsible for coordination of the follow-up plan: spouse  Confirmed correct DME delivered: Andria Rhein 03/08/2023    Comments (or additional information):  Summary of Stay    Date/Time Discharge Planning CSW  03/06/23 1437 Discharging home with hospice on Friday. SW will confirm DME and conference and then update hospice company. CJB  02/27/23 1612 Discharging home with DTR, SIL, spouse and grandchildren. 5 steps to enter. Patient has a WC,  transport chair and RW. CJB       Andria Rhein

## 2023-03-08 NOTE — Progress Notes (Addendum)
Occupational Therapy Session Note  Patient Details  Name: Jeffrey Acevedo MRN: [id number] Date of Birth: 1940/11/04  Today's Date: 03/08/2023 OT Individual Time: 7829-5621+ 1305-1402 ( session 2) OT Individual Time Calculation (min): 77 min ( session 1)    Short Term Goals: Week 2:  OT Short Term Goal 1 (Week 2): pt will rise to stand in stedy lift with mod A of 1 5x in a row to ensure he can safely use stedy lift for toilet transfers. OT Short Term Goal 2 (Week 2): Pt will complete squat pivot to [facility] with mod A of 1 person. OT Short Term Goal 3 (Week 2): pt will hold static standing with mod A of 1 during toileting tasks.  Skilled Therapeutic Interventions/Progress Updates:  Session 1: Pt greeted supine in bed, pt agreeable to OT intervention.      Transfers/bed mobility: pt completed supine>sit with MODA with pt needing most assist for initiation and to scoot hips to EOB. Pt completed sit>stands to stedy with MIN A +2 for safety, pt requires increased time for transfers and MOD verbal cues for hand placement and set- up.   Pt completed functional ambulation in hallway via 3 muskeeter method with pt needing assist at RLE to advance extremity during swing but able to complete 50 ft with MODA +2. Pt required seated rest break but then able to complete an additional 20 ft with eva walker however pt with increased difficulty managing AD needing more of MAX A +2.   ADLs:  Grooming: pt able to brush his hair with set- up assist UB dressing:pt donned OH shirt with MODA with pt needing assist to thread RUE into sleeve  and increased time to sequence steps LB dressing: pt donned pants with MAX A with pt initially able to thread RLE into pants but ultimately needed MAX A and +2 assist to stand  Footwear: donned footwear with total A   Bathing: pt completed bathing from shower seat with MODA with an emphasis on RUE integration  Transfers: utilized stedy for transfers onto shower seat with pt able  to stand to stedy with MIN A +2 with increased time for processing   Ended session with pt seated in w/c with all needs within reach and chair alarm activated.             Session 2: Pt greeted seated in w/c finishing lunch, pt agreeable to OT intervention.    Pts family present for family education ( 2 daughters, wife and caregiver)  family education provided on the below topics: -recommendation of waiting until [facility] starts to attempt shower -shower transfer technique via squat pivot to TTB to R side -daughter assisted this therapist with squat pivot technique to R and L side to TTB -recommended pt lateral lean for posterior bathing -recommended pt enter shower with tshirt, underwear and gait belt donned, and then undress once in shower. Lateral leans to remove LB clothing -recommended +2 for all bathing tasks Recommended towel be placed on seat of TTB to decrease skin break down -demonstrated supine>sit technique from flat HOB with use of bed pad -recommended pt sit up in w/c with appropriate roho cushion during the day with pt using stedy for at least 3 toileting attempts daily -daughter and caregiver and daughter and mother additionally practiced stedy transfers from w/c<>EOM. Family required MIN verbal cues for safety and technique  Family demonstrated appropriate level of assist with ADLs and functional mobility    Ended session with pt handed off  directly to PTA for next session.                 Therapy Documentation Precautions:  Precautions Precautions: Fall, Other (comment) Precaution Comments: strong posterior and pusher Restrictions Weight Bearing Restrictions Per Provider Order: No  Pain: Session 1: Unrated pain reported in R shoulder, repositioned UE as needed for pain mgmt  Session 2: no pain reported during session    Therapy/Group: Individual Therapy  Barron Schmid 03/08/2023, 12:31 PM

## 2023-03-08 NOTE — Plan of Care (Signed)
  Problem: RH Problem Solving Goal: LTG Patient will demonstrate problem solving for (SLP) Description: LTG:  Patient will demonstrate problem solving for basic/complex daily situations with cues  (SLP) Outcome: Not Met (Continues to require up to maxA)   Problem: RH Swallowing Goal: LTG Patient will consume least restrictive diet using compensatory strategies with assistance (SLP) Description: LTG:  Patient will consume least restrictive diet using compensatory strategies with assistance (SLP) Outcome: Completed/Met   Problem: RH Cognition - SLP Goal: RH LTG Patient will demonstrate orientation with cues Description:  LTG:  Patient will demonstrate orientation to person/place/time/situation with cues (SLP)   Outcome: Completed/Met   Problem: RH Expression Communication Goal: LTG Patient will increase speech intelligibility (SLP) Description: LTG: Patient will increase speech intelligibility at word/phrase/conversation level with cues, % of the time (SLP) Outcome: Completed/Met   Problem: RH Memory Goal: LTG Patient will demonstrate ability for day to day (SLP) Description: LTG:   Patient will demonstrate ability for day to day recall/carryover during cognitive/linguistic activities with assist  (SLP) Outcome: Completed/Met   Problem: RH Attention Goal: LTG Patient will demonstrate this level of attention during functional activites (SLP) Description: LTG:  Patient will will demonstrate this level of attention during functional activites (SLP) Outcome: Completed/Met   Problem: RH Awareness Goal: LTG: Patient will demonstrate awareness during functional activites type of (SLP) Description: LTG: Patient will demonstrate awareness during functional activites type of (SLP) Outcome: Completed/Met

## 2023-03-09 ENCOUNTER — Other Ambulatory Visit (HOSPITAL_COMMUNITY): Payer: Self-pay

## 2023-03-09 DIAGNOSIS — I63312 Cerebral infarction due to thrombosis of left middle cerebral artery: Secondary | ICD-10-CM | POA: Diagnosis not present

## 2023-03-09 DIAGNOSIS — C8599 Non-Hodgkin lymphoma, unspecified, extranodal and solid organ sites: Secondary | ICD-10-CM

## 2023-03-09 NOTE — Progress Notes (Addendum)
Patient ID: Jeffrey Acevedo, male   DOB: Mar 25, 1940, 82 y.o.   MRN: [id number]  Family prefers to arrange PTAR for transport. SW ill arrange PTAR between 10-11 AM. Family will meet patient at this d/c location.    9:18 AM: PTAR arranged for 11 AM.    10:50 AM : family in room for d/c.

## 2023-03-09 NOTE — Progress Notes (Signed)
Inpatient Rehabilitation Discharge Medication Review by a Pharmacist  A complete drug regimen review was completed for this patient to identify any potential clinically significant medication issues.  High Risk Drug Classes Is patient taking? Indication by Medication  Antipsychotic No   Anticoagulant No   Antibiotic No   Opioid No   Antiplatelet Yes Aspirin, clopidogrel - CAD  Hypoglycemics/insulin Yes Jardiance - CHF  Vasoactive Medication Yes Spironolactone - CHF metoprolol - CHF  Chemotherapy No   Other Yes Diclofenac - arthritis pain Fenofibrate, Atorvastatin - HLD Iron - supplement Sertraline - Mood Latanoprost - glaucoma     Type of Medication Issue Identified Description of Issue Recommendation(s)  Drug Interaction(s) (clinically significant)     Duplicate Therapy     Allergy     No Medication Administration End Date     Incorrect Dose     Additional Drug Therapy Needed     Significant med changes from prior encounter (inform family/care partners about these prior to discharge).    Other       Clinically significant medication issues were identified that warrant physician communication and completion of prescribed/recommended actions by midnight of the next day:  No  Pharmacist comments: None  Time spent performing this drug regimen review (minutes):  20 minutes  Thank you Okey Regal, PharmD 03/09/2023 8:05 AM

## 2023-03-09 NOTE — Plan of Care (Signed)
Pt did not fully meet these LTGs as pt is not fully consistent based on his levels of fatigue. Family is competent to do these activities but will often need 2 people to ensure his safety.   Family decided to take pt home earlier than recommended LOS to pursue medical treatment.     Problem: Sit to Stand Goal: LTG:  Patient will perform sit to stand in prep for activites of daily living with assistance level (OT) Description: LTG:  Patient will perform sit to stand in prep for activites of daily living with assistance level (OT) Outcome: Adequate for Discharge   Problem: RH Toileting Goal: LTG Patient will perform toileting task (3/3 steps) with assistance level (OT) Description: LTG: Patient will perform toileting task (3/3 steps) with assistance level (OT)  Outcome: Adequate for Discharge

## 2023-03-09 NOTE — Progress Notes (Signed)
PROGRESS NOTE   Subjective/Complaints: Aware of discharge , no c/o   ROS-limited by cognition/behavior.  Objective:   No results found.  Recent Labs    03/08/23 0519  WBC 9.0  HGB 11.8*  HCT 37.0*  PLT 349    Recent Labs    03/08/23 0519  NA 135  K 3.7  CL 103  CO2 25  GLUCOSE 91  BUN 19  CREATININE 1.06  CALCIUM 9.3     Intake/Output Summary (Last 24 hours) at 03/09/2023 0734 Last data filed at 03/09/2023 0500 Gross per 24 hour  Intake 720 ml  Output 175 ml  Net 545 ml     Pressure Injury 09/19/21 Coccyx Unstageable - Full thickness tissue loss in which the base of the injury is covered by slough (yellow, tan, gray, green or [place]) and/or eschar (tan, [place] or black) in the wound bed. oval shaped wound on coccyx with yello (Active)  09/19/21 1450  Location: Coccyx  Location Orientation:   Staging: Unstageable - Full thickness tissue loss in which the base of the injury is covered by slough (yellow, tan, gray, green or [place]) and/or eschar (tan, Cl[place] or black) in the wound bed.  Wound Description (Comments): oval shaped wound on coccyx with yellow slough  Present on Admission: Yes (Originally listed as stage 1 09/19/21; labeled as unstageable 02/22/23 on admit to CIR)    Physical Exam: Vital Signs Blood pressure (!) 140/78, pulse 64, temperature 97.6 F (36.4 C), temperature source Oral, resp. rate 17, height 6' (1.829 m), weight 77 kg, SpO2 92%.   General: No acute distress Mood and affect are appropriate Heart: Regular rate and rhythm no rubs murmurs or extra sounds Lungs: Clear to auscultation, breathing unlabored, no rales or wheezes Abdomen: Positive bowel sounds, soft nontender to palpation, nondistended Extremities: No clubbing, cyanosis, or edema Skin: No evidence of breakdown, no evidence of rash  Neurologic: Cranial nerves II through XII intact, motor strength is 5/5 in Left and 4/5  RIght deltoid, bicep, tricep, grip, 5/5 bilateral hip flexor, knee extensors, ankle dorsiflexor and plantar flexor Oriented to person but not time or place  Musculoskeletal: no pain with AROM in all 4 ext  No joint swelling    Assessment/Plan: 1. 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  Stable for D/C today F/u PCP in 3-4 weeks F/u Onc on Monday 03/12/23 See D/C summary See D/C instructions   Care Tool:  Bathing    Body parts bathed by patient: Right arm, Left arm, Chest, Abdomen, Front perineal area, Right upper leg, Left upper leg, Face   Body parts bathed by helper: Buttocks, Right lower leg, Left lower leg     Bathing assist Assist Level: Moderate Assistance - Patient 50 - 74%     Upper Body Dressing/Undressing Upper body dressing   What is the patient wearing?: Pull over shirt    Upper body assist Assist Level: Moderate Assistance - Patient 50 - 74%    Lower Body Dressing/Undressing Lower body dressing      What is the patient wearing?: Pants, Underwear/pull up     Lower body assist Assist for lower body dressing: Maximal Assistance - Patient  25 - 49%     Toileting Toileting    Toileting assist Assist for toileting: Maximal Assistance - Patient 25 - 49%     Transfers Chair/bed transfer  Transfers assist  Chair/bed transfer activity did not occur: Safety/medical concerns (unsafe to get up)  Chair/bed transfer assist level: Maximal Assistance - Patient 25 - 49%     Locomotion Ambulation   Ambulation assist   Ambulation activity did not occur: Safety/medical concerns  Assist level: Maximal Assistance - Patient 25 - 49% Assistive device: Other (comment) (3 musketeer) Max distance: 50   Walk 10 feet activity   Assist  Walk 10 feet activity did not occur: Safety/medical concerns  Assist level: Maximal Assistance - Patient 25 - 49% Assistive device: Other (comment)   Walk 50 feet activity   Assist Walk 50 feet with 2 turns activity did  not occur: Safety/medical concerns         Walk 150 feet activity   Assist Walk 150 feet activity did not occur: Safety/medical concerns         Walk 10 feet on uneven surface  activity   Assist Walk 10 feet on uneven surfaces activity did not occur: Safety/medical concerns         Wheelchair     Assist Is the patient using a wheelchair?: Yes Type of Wheelchair: Manual    Wheelchair assist level: Total Assistance - Patient < 25%      Wheelchair 50 feet with 2 turns activity    Assist    Wheelchair 50 feet with 2 turns activity did not occur: Safety/medical concerns   Assist Level: Dependent - Patient 0%   Wheelchair 150 feet activity     Assist  Wheelchair 150 feet activity did not occur: Safety/medical concerns   Assist Level: Dependent - Patient 0%   Blood pressure (!) 140/78, pulse 64, temperature 97.6 F (36.4 C), temperature source Oral, resp. rate 17, height 6' (1.829 m), weight 77 kg, SpO2 92%.  Medical Problem List and Plan: 1. 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  Antithrombotics: -DVT/anticoagulation:  Pharmaceutical: Lovenox             -antiplatelet therapy: DAPT 3. Pain Management: Tylenol prn. Gabapentin 100 mg TID was added on 11/28   RIght shoulder pain  hemiparetic side ,seems intermittent overall improved with strength 4. Mood/Behavior/Sleep:  LCSW to follow for evaluation and support.              -antipsychotic agents: N/A--monitor for delirium/MS changes with recent stroke/change in environment insetting of baseline dementia. Started on  gabapentin in acute care, no neurogenic pain reported will stop and monitor  5. Neuropsych/cognition: This patient may be intermittently capable of making decisions on his own behalf.             --family has noticed some disorientation and have been present 24/7 at [facility]. Unable to do that here             --  also HOH and wears bilateral hearing aids. 6. Skin/Wound Care:  Routine pressure relief pressure.   7. Fluids/Electrolytes/Nutrition: Monitor I/O. Check CMET in am             --Monitor I/O. Lasix was d/c     Latest Ref Rng & Units 03/08/2023    5:19 AM 03/05/2023    5:15 AM 03/01/2023    5:09 AM  BMP  Glucose 70 - 99 mg/dL 91  92  87   BUN 8 - 23 mg/dL 19  20  23    Creatinine 0.61 - 1.24 mg/dL 3.08  6.57  8.46   Sodium 135 - 145 mmol/L 135  136  137   Potassium 3.5 - 5.1 mmol/L 3.7  3.7  4.0   Chloride 98 - 111 mmol/L 103  103  104   CO2 22 - 32 mmol/L 25  25  26    Calcium 8.9 - 10.3 mg/dL 9.3  9.7  9.6      8. CAD/Chronic diastolic CHF: Followed by Dr. Allyson Sabal. HH diet with strict I/O. Daily weights             --Most recent PCI 12/29/2022 followed by admissions for fluid overload Continue Jardiance , Lopressor , aldactone              --was on Lasix 40 mg BID PTA-->d/c on 12/04 for ??. Monitor for signs of overload  12/14- hasn't had weights checked for 3 days- will d/w nursing.  9. Chronic respiratory failure w/nocturnal hypoxia: Since Sept--sleep study negative for OSA             --currently oxygen dependent with activity and intermittently during hospitalization --Encourage pulmonary hygiene. Will check CXR to rule out overload.              --Oxygen prn during the day and continue at nights.    10. New diagnosis B-cell lymphoma: Developing multiple nodules for past 2 months- plan was for OP f/u with oncology in [place] [facility] to set up f/u with  Dr Melvyn Neth in [place]              --just received bx reports yesterday-->called derm for path report              --question coagulopathy as cause of stroke. Will check dopplers          Per onc Non Hodgkins Lymphoma + comorbid conditions qualify pt for in home hospice.  Per Onc consult palliative care consult recommended  but not ordered . Will order 11/13     11. CKD 3a: Baseline SCr 1.4-1.5 per records             --monitor weekly  12. Hx of anemia of chronic disease:Hgb stable as well as B12            --continue iron supplement    Latest Ref Rng & Units 03/08/2023    5:19 AM 03/05/2023    5:15 AM 03/01/2023    5:09 AM  CBC  WBC 4.0 - 10.5 K/uL 9.0  8.2  7.4   Hemoglobin 13.0 - 17.0 g/dL 96.2  95.2  84.1   Hematocrit 39.0 - 52.0 % 37.0  38.8  38.4   Platelets 150 - 400 K/uL 349  383  351     13. Hx of vitamin B 12 deficiency: Continue high doses of B12   14. Chronic left knee pain: Monitor for symptoms with increase in activity/Right hemipareis  15. Hx major depressive d/o: continue zoloft.   16. Hx prostate cancer- sounds like could be indolent:Has urgency/nocturia. Toilet every 4 hours. Continue to check PVRs  17. Hx prediabetes: Hgb A 1c- 5.9 few months ago. Monitor fasting BS with serial labs.   18. Sore throat  12/14- pt doesn't want throat spray- can add if needed.   12/15- sore throat gone this AM       LOS: 15 days A FACE TO FACE EVALUATION WAS PERFORMED  Erick Colace 03/09/2023, 7:34 AM

## 2023-03-11 ENCOUNTER — Other Ambulatory Visit: Payer: Self-pay | Admitting: Oncology

## 2023-03-11 DIAGNOSIS — C8599 Non-Hodgkin lymphoma, unspecified, extranodal and solid organ sites: Secondary | ICD-10-CM

## 2023-03-11 NOTE — Progress Notes (Unsigned)
[facility]  [address] [place],  [place]  [address] [phone]  Clinic Day:  03/11/2023  Referring physician: Storm Frisk T   HISTORY OF PRESENT ILLNESS:  The patient is a 82 y.o. male  *** who I was asked to consult upon for ***   PAST MEDICAL HISTORY:   Past Medical History:  Diagnosis Date   Acute on chronic diastolic CHF (congestive heart failure) (HCC) 01/03/2023   Cancer (HCC)    prostate cancer surgery 2011   Chronic diastolic heart failure (HCC)    Coronary artery disease    sees Dr. Erlene Quan 3 mths ago.  Stress test 03/31/2015 in epic   Degenerative joint disease    Right hip   Depression    Hearing loss of both ears    only at present wears the right ear hearing aid   Hyperlipidemia    Hypertension    Prostate cancer (HCC)     PAST SURGICAL HISTORY:   Past Surgical History:  Procedure Laterality Date   CARDIAC CATHETERIZATION  06/17/2009   Proximal LAD 90% stenosis just beyond previously stented segment with a Multi-Link Vision bare-metal stent   CORONARY ANGIOPLASTY     stents placed in 1998 and 2011    CORONARY PRESSURE/FFR STUDY N/A 05/02/2021   Procedure: INTRAVASCULAR PRESSURE WIRE/FFR STUDY;  Surgeon: Runell Gess, MD;  [facility];  Service: Cardiovascular;  Laterality: N/A;   CORONARY PRESSURE/FFR STUDY N/A 09/19/2021   Procedure: INTRAVASCULAR PRESSURE WIRE/FFR STUDY;  Surgeon: Runell Gess, MD;  [facility];  Service: Cardiovascular;  Laterality: N/A;  RAMUS   CORONARY STENT INTERVENTION N/A 05/02/2021   Procedure: CORONARY STENT INTERVENTION;  Surgeon: Runell Gess, MD;  [facility];  Service: Cardiovascular;  Laterality: N/A;   CORONARY STENT INTERVENTION N/A 09/19/2021   Procedure: CORONARY STENT INTERVENTION;  Surgeon: Runell Gess, MD;  [facility];  Service: Cardiovascular;  Laterality: N/A;   CORONARY STENT INTERVENTION  N/A 12/29/2022   Procedure: CORONARY STENT INTERVENTION;  Surgeon: Kathleene Hazel, MD;  [facility];  Service: Cardiovascular;  Laterality: N/A;   LEFT HEART CATH AND CORONARY ANGIOGRAPHY N/A 05/02/2021   Procedure: LEFT HEART CATH AND CORONARY ANGIOGRAPHY;  Surgeon: Runell Gess, MD;  [facility];  Service: Cardiovascular;  Laterality: N/A;   LEFT HEART CATH AND CORONARY ANGIOGRAPHY N/A 09/19/2021   Procedure: LEFT HEART CATH AND CORONARY ANGIOGRAPHY;  Surgeon: Runell Gess, MD;  [facility];  Service: Cardiovascular;  Laterality: N/A;   NASAL SEPTUM SURGERY     Dr. Richardson Landry @ [facility]  2014   NM MYOVIEW LTD  2011   RIGHT/LEFT HEART CATH AND CORONARY ANGIOGRAPHY N/A 12/29/2022   Procedure: RIGHT/LEFT HEART CATH AND CORONARY ANGIOGRAPHY;  Surgeon: Kathleene Hazel, MD;  [facility];  Service: Cardiovascular;  Laterality: N/A;   ROBOT ASSISTED LAPAROSCOPIC RADICAL PROSTATECTOMY     TOTAL HIP ARTHROPLASTY Right 04/19/2015   Procedure: RIGHT TOTAL HIP ARTHROPLASTY ANTERIOR APPROACH;  Surgeon: Jodi Geralds, MD;  [facility];  Service: Orthopedics;  Laterality: Right;    CURRENT MEDICATIONS:   Current Outpatient Medications  Medication Sig Dispense Refill   aspirin EC 81 MG tablet Take 1 tablet (81 mg total) by mouth daily. Swallow whole. 90 tablet 0   atorvastatin (LIPITOR) 40 MG tablet Take 1 tablet (  40 mg total) by mouth daily after supper. 30 tablet 0   clopidogrel (PLAVIX) 75 MG tablet Take 1 tablet (75 mg total) by mouth daily. 30 tablet 0   Cyanocobalamin (VITAMIN B-12 PO) Take 2,500 mcg by mouth every morning.     diclofenac Sodium (VOLTAREN) 1 % GEL Apply 2 g topically to right shoulder 4 (four) times daily 350 g 0   empagliflozin (JARDIANCE) 10 MG TABS tablet Take 1 tablet (10 mg total) by mouth daily. 30 tablet 0   Fenofibrate 40 MG TABS Take 2 tablets (80 mg total) by mouth at  bedtime. 60 tablet 0   FIBER PO Take 1 tablet by mouth daily as needed (Constipation).     fluticasone (FLONASE) 50 MCG/ACT nasal spray Place 1 spray into both nostrils daily.     iron polysaccharides (NIFEREX) 150 MG capsule Take 1 capsule (150 mg total) by mouth daily with lunch. 30 capsule 0   latanoprost (XALATAN) 0.005 % ophthalmic solution Place 1 drop into the left eye at bedtime.     leptospermum manuka honey (MEDIHONEY) PSTE paste Apply 1 Application topically daily. Apply a thin layer to yellow slough on buttocks and cover with dry dressing. Change dressing daily and more frequently if soiled 30 mL 0   metoprolol tartrate (LOPRESSOR) 25 MG tablet Take 1 tablet (25 mg total) by mouth daily. 90 tablet 3   Multiple Vitamin (MULTIVITAMIN WITH MINERALS) TABS tablet Take 1 tablet by mouth daily. 30 tablet 0   nitroGLYCERIN (NITROSTAT) 0.4 MG SL tablet Place 1 tablet (0.4 mg total) under the tongue every 5 (five) minutes as needed for chest pain. 25 tablet 3   polyethylene glycol powder (GLYCOLAX/MIRALAX) 17 GM/SCOOP powder Take 17 g by mouth daily. 238 g 0   sertraline (ZOLOFT) 25 MG tablet Take 1 tablet (25 mg total) by mouth daily. 30 tablet 0   sodium chloride (OCEAN) 0.65 % SOLN nasal spray Place 1 spray into both nostrils 2 (two) times daily before lunch and supper. 88 mL 0   spironolactone (ALDACTONE) 25 MG tablet Take 0.5 tablets (12.5 mg total) by mouth daily. 15 tablet 0   triamcinolone cream (KENALOG) 0.1 % Apply topically to the affected area 2 (two) times daily for 2 to 3 weeks (Patient taking differently: Apply 1 Application topically as needed.) 454 g 2   No current facility-administered medications for this visit.    ALLERGIES:   Allergies  Allergen Reactions   Adhesive [Tape] Other (See Comments)    Mainly just redness.   Has been ruled out for latex allegery   Hydrochlorothiazide Other (See Comments)    H/o Pancreatitis, takes in Avalide at home   Latex Other (See  Comments)    unknown   Vantin [Cefpodoxime] Rash    FAMILY HISTORY:   Family History  Problem Relation Age of Onset   Hypertension Mother    Heart disease Father    Hypertension Father    Leukemia Father    Pancreatic cancer Brother     SOCIAL HISTORY:   reports that he has never smoked. He has never used smokeless tobacco. He reports that he does not drink alcohol and does not use drugs.  REVIEW OF SYSTEMS:  Review of Systems - Oncology   PHYSICAL EXAM:  There were no vitals taken for this visit. Wt Readings from Last 3 Encounters:  02/20/23 177 lb 1 oz (80.3 kg)  02/02/23 181 lb (82.1 kg)  01/26/23 181 lb (82.1 kg)  There is no height or weight on file to calculate BMI. Performance status (ECOG): {CHL ONC [id number] Physical Exam  LABS:      Latest Ref Rng & Units 03/08/2023    5:19 AM 03/05/2023    5:15 AM 03/01/2023    5:09 AM  CBC  WBC 4.0 - 10.5 K/uL 9.0  8.2  7.4   Hemoglobin 13.0 - 17.0 g/dL 16.1  09.6  04.5   Hematocrit 39.0 - 52.0 % 37.0  38.8  38.4   Platelets 150 - 400 K/uL 349  383  351       Latest Ref Rng & Units 03/08/2023    5:19 AM 03/05/2023    5:15 AM 03/01/2023    5:09 AM  CMP  Glucose 70 - 99 mg/dL 91  92  87   BUN 8 - 23 mg/dL 19  20  23    Creatinine 0.61 - 1.24 mg/dL 4.09  8.11  9.14   Sodium 135 - 145 mmol/L 135  136  137   Potassium 3.5 - 5.1 mmol/L 3.7  3.7  4.0   Chloride 98 - 111 mmol/L 103  103  104   CO2 22 - 32 mmol/L 25  25  26    Calcium 8.9 - 10.3 mg/dL 9.3  9.7  9.6      No results found for: "CEA1", "CEA" / No results found for: "CEA1", "CEA" No results found for: "PSA1" No results found for: "NWG956" No results found for: "CAN125"  No results found for: "TOTALPROTELP", "ALBUMINELP", "A1GS", "A2GS", "BETS", "BETA2SER", "GAMS", "MSPIKE", "SPEI" Lab Results  Component Value Date   TIBC 374 01/17/2023   FERRITIN 1,002 (H) 01/17/2023   IRONPCTSAT 9 (L) 01/17/2023   No results found for: "LDH"  No results  found for: "AFPTUMOR", "TOTALPROTELP", "ALBUMINELP", "A1GS", "A2GS", "BETS", "BETA2SER", "GAMS", "MSPIKE", "SPEI", "LDH", "CEA1", "CEA", "PSA1", "IGASERUM", "IGGSERUM", "IGMSERUM", "THGAB", "THYROGLB"  Review Flowsheet       Latest Ref Rng & Units 01/17/2023  Oncology Labs  Ferritin 24 - 336 ng/mL 1,002   %SAT 17.9 - 39.5 % 9     STUDIES:  DG Swallowing Func-Speech Pathology Result Date: 02/28/2023 Table formatting from the original result was not included. Modified Barium Swallow Study Patient Details Name: Jeffrey Acevedo MRN: [id number] Date of Birth: January 08, 1941 Today's Date: 02/28/2023 HPI/PMH: HPI: Jeffrey Acevedo is a [name] with history of CAD, HTN, CKD 3a, chronic diastolic CHF, hearing loss, dementia, who was admitted to [facility] on [facility]/23 with reports of right sided weakness and inability hold fork or to feed himself. He was found to have acute on chronic renal failure with SCr 106,   MRI brain done revealing  acute-subacute left temporal as well as left parietal and right parietal lobes. Clinical Impression: Patient presents with mild oral dysphagia with functional pharyngeal phase. Oral phase is characterized by reduced lingual control/coordination and poor bolus prep/mastication resulting in occasional lingual pumping, oral residuals and posterior spill to pyriform sinuses. Pharyngeal phase is functional for patients age/medical status, with tracely reduced pharyngeal stripping wave, though remarkable for extremely trace transient penetration of thin liquids x1 and vallecular residuals. No other penetration/aspiration observed throughout consistencies. Vallecular residue increased post-prandially due to additional oral residue spillage. Vallecular residuals effectively cleared through second cued dry swallow. Recommend continuation of D3/thin liquid diet with second dry swallow after all consistencies. Patient with consumption of pill in puree during study, though chewed rather  than swallowed whole despite instruction. Continue  medications crushed in puree with upcoming trials of whole in puree w/ use of compensatory strategies. Patient should continue to follow standardized swallowing precautions including small bites/sips, slow rate, and sitting upright at 90 degrees. Factors that may increase risk of adverse event in presence of aspiration [name] & [name] 2021): Factors that may increase risk of adverse event in presence of aspiration [name] & [name] 2021): Reduced cognitive function Recommendations/Plan: Swallowing Evaluation Recommendations Swallowing Evaluation Recommendations Recommendations: PO diet PO Diet Recommendation: Dysphagia 3 (Mechanical soft); Thin liquids (Level 0) Liquid Administration via: Cup; Straw Medication Administration: Crushed with puree Supervision: Full supervision/cueing for swallowing strategies Swallowing strategies  : Minimize environmental distractions; Slow rate; Small bites/sips; Multiple dry swallows after each bite/sip Postural changes: Position pt fully upright for meals Oral care recommendations: Oral care BID (2x/day) Treatment Plan Treatment Plan Treatment recommendations: Therapy as outlined in treatment plan below Follow-up recommendations: Home health SLP Recommendations Comment: n/a Functional status assessment: Patient has had a recent decline in their functional status and/or demonstrates limited ability to make significant improvements in function in a reasonable and predictable amount of time. Treatment frequency: Min 2x/week Treatment duration: 3 weeks Interventions: Aspiration precaution training; Compensatory techniques; Patient/family education; Diet toleration management by SLP Recommendations Recommendations for follow up therapy are one component of a multi-disciplinary discharge planning process, led by the attending physician.  Recommendations may be updated based on patient status, additional functional criteria and  insurance authorization. Assessment: Orofacial Exam: Orofacial Exam Oral Cavity: Oral Hygiene: WFL Oral Cavity - Dentition: Adequate natural dentition Orofacial Anatomy: WFL Anatomy: Anatomy: WFL Boluses Administered: Boluses Administered Boluses Administered: Thin liquids (Level 0); Mildly thick liquids (Level 2, nectar thick); Puree; Solid  Oral Impairment Domain: Oral Impairment Domain Lip Closure: No labial escape Tongue control during bolus hold: Posterior escape of less than half of bolus Bolus preparation/mastication: Disorganized chewing/mashing with solid pieces of bolus unchewed Bolus transport/lingual motion: Repetitive/disorganized tongue motion Oral residue: Residue collection on oral structures Location of oral residue : Palate; Tongue Initiation of pharyngeal swallow : Pyriform sinuses  Pharyngeal Impairment Domain: Pharyngeal Impairment Domain Soft palate elevation: No bolus between soft palate (SP)/pharyngeal wall (PW) Laryngeal elevation: Complete superior movement of thyroid cartilage with complete approximation of arytenoids to epiglottic petiole Anterior hyoid excursion: Complete anterior movement Epiglottic movement: Complete inversion Laryngeal vestibule closure: Complete, no air/contrast in laryngeal vestibule Pharyngeal stripping wave : Present - diminished Pharyngeal contraction (A/P view only): N/A Pharyngoesophageal segment opening: Complete distension and complete duration, no obstruction of flow Tongue base retraction: Trace column of contrast or air between tongue base and PPW Pharyngeal residue: Collection of residue within or on pharyngeal structures Location of pharyngeal residue: Valleculae  Esophageal Impairment Domain: Esophageal Impairment Domain Esophageal clearance upright position: Complete clearance, esophageal coating Pill: Pill Consistency administered: Puree Puree: WFL Penetration/Aspiration Scale Score: Penetration/Aspiration Scale Score 1.  Material does not enter  airway: Mildly thick liquids (Level 2, nectar thick); Puree; Solid; Pill 2.  Material enters airway, remains ABOVE vocal cords then ejected out: Thin liquids (Level 0) Compensatory Strategies: Compensatory Strategies Compensatory strategies: Yes Multiple swallows: Effective Effective Multiple Swallows: Thin liquid (Level 0); Moderately thick liquid (Level 3, honey thick); Puree; Solid; Pill   General Information: No data recorded Diet Prior to this Study: Thin liquids (Level 0); Dysphagia 3 (mechanical soft)   No data recorded  Respiratory Status: WFL   Supplemental O2: None (Room air)   No data recorded Behavior/Cognition: Cooperative; Confused; Requires cueing Self-Feeding Abilities: Needs assist with self-feeding;  Dependent for feeding (depends on physical need at the time) Baseline vocal quality/speech: Hypophonia/low volume; Abnormal resonance Volitional Cough: Able to elicit Volitional Swallow: Able to elicit Exam Limitations: No limitations Goal Planning: Prognosis for improved oropharyngeal function: Fair Barriers to Reach Goals: Cognitive deficits Barriers/Prognosis Comment: dementia Patient/Family Stated Goal: n/a Consulted and agree with results and recommendations: Patient Pain: No data recorded End of Session: Start Time:No data recorded Stop Time: No data recorded Time Calculation:No data recorded Charges: No data recorded SLP visit diagnosis: SLP Visit Diagnosis: Dysphagia, oropharyngeal phase (R13.12) Past Medical History: Past Medical History: Diagnosis Date  Acute on chronic diastolic CHF (congestive heart failure) (HCC) 01/03/2023  Cancer (HCC)   prostate cancer surgery 2011  Chronic diastolic heart failure (HCC)   Coronary artery disease   sees Dr. Erlene Quan 3 mths ago.  Stress test 03/31/2015 in epic  Degenerative joint disease   Right hip  Depression   Hearing loss of both ears   only at present wears the right ear hearing aid  Hyperlipidemia   Hypertension   Prostate cancer [facility])  Past Surgical  History: Past Surgical History: Procedure Laterality Date  CARDIAC CATHETERIZATION  06/17/2009  Proximal LAD 90% stenosis just beyond previously stented segment with a Multi-Link Vision bare-metal stent  CORONARY ANGIOPLASTY    stents placed in 1998 and 2011   CORONARY PRESSURE/FFR STUDY N/A 05/02/2021  Procedure: INTRAVASCULAR PRESSURE WIRE/FFR STUDY;  Surgeon: Runell Gess, MD;  [facility];  Service: Cardiovascular;  Laterality: N/A;  CORONARY PRESSURE/FFR STUDY N/A 09/19/2021  Procedure: INTRAVASCULAR PRESSURE WIRE/FFR STUDY;  Surgeon: Runell Gess, MD;  [facility];  Service: Cardiovascular;  Laterality: N/A;  RAMUS  CORONARY STENT INTERVENTION N/A 05/02/2021  Procedure: CORONARY STENT INTERVENTION;  Surgeon: Runell Gess, MD;  [facility];  Service: Cardiovascular;  Laterality: N/A;  CORONARY STENT INTERVENTION N/A 09/19/2021  Procedure: CORONARY STENT INTERVENTION;  Surgeon: Runell Gess, MD;  [facility];  Service: Cardiovascular;  Laterality: N/A;  CORONARY STENT INTERVENTION N/A 12/29/2022  Procedure: CORONARY STENT INTERVENTION;  Surgeon: Kathleene Hazel, MD;  [facility];  Service: Cardiovascular;  Laterality: N/A;  LEFT HEART CATH AND CORONARY ANGIOGRAPHY N/A 05/02/2021  Procedure: LEFT HEART CATH AND CORONARY ANGIOGRAPHY;  Surgeon: Runell Gess, MD;  [facility];  Service: Cardiovascular;  Laterality: N/A;  LEFT HEART CATH AND CORONARY ANGIOGRAPHY N/A 09/19/2021  Procedure: LEFT HEART CATH AND CORONARY ANGIOGRAPHY;  Surgeon: Runell Gess, MD;  [facility];  Service: Cardiovascular;  Laterality: N/A;  NASAL SEPTUM SURGERY    Dr. Richardson Landry @ [facility]  2014  NM MYOVIEW LTD  2011  RIGHT/LEFT HEART CATH AND CORONARY ANGIOGRAPHY N/A 12/29/2022  Procedure: RIGHT/LEFT HEART CATH AND CORONARY ANGIOGRAPHY;  Surgeon: Kathleene Hazel, MD;  [facility];  Service: Cardiovascular;  Laterality: N/A;  ROBOT ASSISTED LAPAROSCOPIC RADICAL PROSTATECTOMY    TOTAL HIP ARTHROPLASTY Right 04/19/2015  Procedure: RIGHT TOTAL HIP ARTHROPLASTY ANTERIOR APPROACH;  Surgeon: Jodi Geralds, MD;  [facility];  Service: Orthopedics;  Laterality: Right; Jeffrey Acevedo 02/28/2023, 3:36 PM  DG Wrist Complete Right Result Date: 02/27/2023 CLINICAL DATA:  Right upper extremity pain. EXAM: RIGHT WRIST - COMPLETE 3+ VIEW; RIGHT FOREARM - 2 VIEW COMPARISON:  None Available. FINDINGS: There is no acute fracture or dislocation. The bones are osteopenic. There is arthritic changes of the  wrist with severe narrowing of the radiocarpal distance. The soft tissues are unremarkable. IMPRESSION: 1. No acute fracture or dislocation. 2. 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 Electronically Signed   By: Elgie Collard M.D.   On: 02/27/2023 18:18   DG Forearm Right Result Date: 02/27/2023 CLINICAL DATA:  Right upper extremity pain. EXAM: RIGHT WRIST - COMPLETE 3+ VIEW; RIGHT FOREARM - 2 VIEW COMPARISON:  None Available. FINDINGS: There is no acute fracture or dislocation. The bones are osteopenic. There is arthritic changes of the wrist with severe narrowing of the radiocarpal distance. The soft tissues are unremarkable. IMPRESSION: 1. No acute fracture or dislocation. 2. 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 Electronically Signed   By: Elgie Collard M.D.   On: 02/27/2023 18:18   VAS [place] LOWER EXTREMITY VENOUS (DVT) Result Date: 02/23/2023  Lower Venous DVT Study Patient Name:  Jeffrey Acevedo  Date of Exam:   02/23/2023 Medical Rec #: [id number]        Accession #:    [id number] Date of Birth: 1940-07-12        Patient Gender: M Patient Age:   78 years Exam Location:  [facility] Procedure:      VAS [place] LOWER EXTREMITY VENOUS (DVT) Referring Phys: PAMELA LOVE --------------------------------------------------------------------------------   Indications: Stroke. Other Indications: CHF. Comparison Study: Left lower extremity venous duplex on 10.16.2024. Performing Technologist: Fernande Bras  Examination Guidelines: A complete evaluation includes B-mode imaging, spectral Doppler, color Doppler, and power Doppler as needed of all accessible portions of each vessel. Bilateral testing is considered an integral part of a complete examination. Limited examinations for reoccurring indications may be performed as noted. The reflux portion of the exam is performed with the patient in reverse Trendelenburg.  +---------+---------------+---------+-----------+----------+--------------+ RIGHT    CompressibilityPhasicitySpontaneityPropertiesThrombus Aging +---------+---------------+---------+-----------+----------+--------------+ CFV      Full           Yes      Yes                                 +---------+---------------+---------+-----------+----------+--------------+ SFJ      Full                                                        +---------+---------------+---------+-----------+----------+--------------+ FV Prox  Full                                                        +---------+---------------+---------+-----------+----------+--------------+ FV Mid   Full                                                        +---------+---------------+---------+-----------+----------+--------------+ FV DistalFull                                                        +---------+---------------+---------+-----------+----------+--------------+  PFV      Full                                                        +---------+---------------+---------+-----------+----------+--------------+ POP      Full           Yes      Yes                                 +---------+---------------+---------+-----------+----------+--------------+ PTV      Full                                                         +---------+---------------+---------+-----------+----------+--------------+ PERO     Full                                                        +---------+---------------+---------+-----------+----------+--------------+ Fluid collection noted in distal thigh measuring 2 x 1 x 2.1 cm and in superior calf measuing 2.7 x2.4 x 3.3 cm with vascularity.  +---------+---------------+---------+-----------+----------+--------------+ LEFT     CompressibilityPhasicitySpontaneityPropertiesThrombus Aging +---------+---------------+---------+-----------+----------+--------------+ CFV      Full           Yes      Yes                                 +---------+---------------+---------+-----------+----------+--------------+ SFJ      Full                                                        +---------+---------------+---------+-----------+----------+--------------+ FV Prox  Full                                                        +---------+---------------+---------+-----------+----------+--------------+ FV Mid   Full                                                        +---------+---------------+---------+-----------+----------+--------------+ FV DistalFull                                                        +---------+---------------+---------+-----------+----------+--------------+ PFV      Full                                                        +---------+---------------+---------+-----------+----------+--------------+  POP      Full           Yes      Yes                                 +---------+---------------+---------+-----------+----------+--------------+ PTV      Full                                                        +---------+---------------+---------+-----------+----------+--------------+ PERO     Full                                                         +---------+---------------+---------+-----------+----------+--------------+     Summary: BILATERAL: - No evidence of deep vein thrombosis seen in the lower extremities, bilaterally. -No evidence of popliteal cyst, bilaterally.   *See table(s) above for measurements and observations. Electronically signed by Carolynn Sayers on 02/23/2023 at 4:54:16 PM.    Final    DG Shoulder Right Result Date: 02/22/2023 CLINICAL DATA:  Patient unable to move right arm.  Pain. EXAM: RIGHT SHOULDER - 2+ VIEW COMPARISON:  None Available. FINDINGS: There is no acute fracture or dislocation. The bones are osteopenic. Faint density along the inferior bony glenoid, chronic and likely degenerative. There is mild degenerative changes of the right AC joint. The soft tissues are unremarkable. IMPRESSION: 1. No acute fracture or dislocation. 2. Osteopenia and mild degenerative changes. Electronically Signed   By: Elgie Collard M.D.   On: 02/22/2023 18:52   DG Wrist Complete Right Result Date: 02/22/2023 CLINICAL DATA:  Pain with decreased range of motion. EXAM: RIGHT WRIST - COMPLETE 3+ VIEW COMPARISON:  None Available. FINDINGS: There are degenerative change scratch set there is degenerative changes involving the radiocarpal joint. Mild increase caliber of the scapholunate interval which measures 5 mm. No signs of acute fracture or dislocation. IMPRESSION: 1. No acute findings. 2. Degenerative changes involving the radiocarpal joint. 3. Mild increase caliber of the scapholunate interval which may reflect scapholunate ligament injury. Electronically Signed   By: Signa Kell M.D.   On: 02/22/2023 18:51   DG Chest 2 View Result Date: 02/22/2023 CLINICAL DATA:  Hypoxia EXAM: CHEST - 2 VIEW COMPARISON:  Chest x-ray 01/12/2023 FINDINGS: There are trace bilateral pleural effusions. There is no focal lung infiltrate or pneumothorax. The cardiomediastinal silhouette is within normal limits. No acute fractures are seen. IMPRESSION: Trace  bilateral pleural effusions. Electronically Signed   By: Darliss Cheney M.D.   On: 02/22/2023 18:51   DG Abd 1 View Result Date: 02/22/2023 CLINICAL DATA:  Bowel incontinence EXAM: ABDOMEN - 1 VIEW COMPARISON:  None Available. FINDINGS: Bowel gas pattern appears nonobstructed. No dilated loops of large or small bowel identified. No abnormal abdominal or pelvic calcifications identified. Previous right hip arthroplasty. IMPRESSION: Nonobstructive bowel gas pattern. Electronically Signed   By: Signa Kell M.D.   On: 02/22/2023 18:50     ASSESSMENT & PLAN:  A 82 y.o. male who I was asked to consult upon for *** .The patient understands all the plans discussed today and is in agreement with  them.  I do appreciate Olegario Shearer for his new consult.   Roberts Bon Kirby Funk, MD

## 2023-03-12 ENCOUNTER — Other Ambulatory Visit: Payer: Self-pay | Admitting: Oncology

## 2023-03-12 ENCOUNTER — Inpatient Hospital Stay: Payer: Medicare PPO | Attending: Oncology | Admitting: Oncology

## 2023-03-12 ENCOUNTER — Encounter: Payer: Self-pay | Admitting: Registered Nurse

## 2023-03-12 ENCOUNTER — Encounter: Payer: Self-pay | Admitting: Oncology

## 2023-03-12 ENCOUNTER — Inpatient Hospital Stay: Payer: Medicare PPO

## 2023-03-12 VITALS — BP 113/61 | HR 50 | Temp 97.6°F | Resp 16 | Ht 72.0 in

## 2023-03-12 DIAGNOSIS — C8599 Non-Hodgkin lymphoma, unspecified, extranodal and solid organ sites: Secondary | ICD-10-CM

## 2023-03-12 DIAGNOSIS — C8510 Unspecified B-cell lymphoma, unspecified site: Secondary | ICD-10-CM

## 2023-03-12 LAB — CMP (CANCER CENTER ONLY)
ALT: 8 U/L (ref 0–44)
AST: 24 U/L (ref 15–41)
Albumin: 3.7 g/dL (ref 3.5–5.0)
Alkaline Phosphatase: 56 U/L (ref 38–126)
Anion gap: 10 (ref 5–15)
BUN: 20 mg/dL (ref 8–23)
CO2: 27 mmol/L (ref 22–32)
Calcium: 10.3 mg/dL (ref 8.9–10.3)
Chloride: 101 mmol/L (ref 98–111)
Creatinine: 1.13 mg/dL (ref 0.61–1.24)
GFR, Estimated: >60 mL/min (ref >60)
Glucose, Bld: 107 mg/dL — ABNORMAL HIGH (ref 70–99)
Potassium: 4.3 mmol/L (ref 3.5–5.1)
Sodium: 138 mmol/L (ref 135–145)
Total Bilirubin: 0.6 mg/dL (ref <1.2)
Total Protein: 6.8 g/dL (ref 6.5–8.1)

## 2023-03-12 LAB — CBC WITH DIFFERENTIAL (CANCER CENTER ONLY)
Abs Immature Granulocytes: 0.04 10*3/uL (ref 0.00–0.07)
Basophils Absolute: 0.1 10*3/uL (ref 0.0–0.1)
Basophils Relative: 1 %
Eosinophils Absolute: 0.8 10*3/uL — ABNORMAL HIGH (ref 0.0–0.5)
Eosinophils Relative: 8 %
HCT: 41 % (ref 39.0–52.0)
Hemoglobin: 13 g/dL (ref 13.0–17.0)
Immature Granulocytes: 0 %
Lymphocytes Relative: 12 %
Lymphs Abs: 1.2 10*3/uL (ref 0.7–4.0)
MCH: 26.5 pg (ref 26.0–34.0)
MCHC: 31.7 g/dL (ref 30.0–36.0)
MCV: 83.7 fL (ref 80.0–100.0)
Monocytes Absolute: 1.3 10*3/uL — ABNORMAL HIGH (ref 0.1–1.0)
Monocytes Relative: 13 %
Neutro Abs: 6.5 10*3/uL (ref 1.7–7.7)
Neutrophils Relative %: 66 %
Platelet Count: 363 10*3/uL (ref 150–400)
RBC: 4.9 MIL/uL (ref 4.22–5.81)
RDW: 15.6 % — ABNORMAL HIGH (ref 11.5–15.5)
WBC Count: 9.8 10*3/uL (ref 4.0–10.5)
nRBC: 0 % (ref 0.0–0.2)
nRBC: 0 /100{WBCs}

## 2023-03-12 LAB — LACTATE DEHYDROGENASE: LDH: 269 U/L — ABNORMAL HIGH (ref 98–192)

## 2023-03-12 NOTE — Plan of Care (Signed)
Pt and family with decision to initiate treatment for recently diagnosed cancer. Did not meet recommended length of stay for IPR.    Problem: RH Balance Goal: LTG Patient will maintain dynamic sitting balance (PT) Description: LTG:  Patient will maintain dynamic sitting balance with assistance during mobility activities (PT) Outcome: Adequate for Discharge Flowsheets (Taken 03/08/2023 1722) LTG: Pt will maintain dynamic sitting balance during mobility activities with:: Moderate Assistance - Patient 50 - 74% Note: Pt can range from supervision to requiring ModA to maintain balance   Problem: RH Bed Mobility Goal: LTG Patient will perform bed mobility with assist (PT) Description: LTG: Patient will perform bed mobility with assistance, with/without cues (PT). Outcome: Adequate for Discharge Flowsheets (Taken 03/08/2023 1722) LTG: Pt will perform bed mobility with assistance level of: Moderate Assistance - Patient 50 - 74% Note: Rolling = supervision Supine to sit = MinA Sit to supine = ModA   Problem: RH Bed to Chair Transfers Goal: LTG Patient will perform bed/chair transfers w/assist (PT) Description: LTG: Patient will perform bed to chair transfers with assistance (PT). Outcome: Adequate for Discharge Flowsheets (Taken 03/08/2023 1722) LTG: Pt will perform Bed to Chair Transfers with assistance level: Maximal Assistance - Patient 25 - 49%   Problem: RH Wheelchair Mobility Goal: LTG Patient will propel w/c in controlled environment (PT) Description: LTG: Patient will propel wheelchair in controlled environment, # of feet with assist (PT) Outcome: Adequate for Discharge Flowsheets Taken 03/08/2023 1722 LTG: Pt will propel w/c in controlled environ  assist needed:: Maximal Assistance - Patient 25 - 49% Taken 03/03/2023 1609 LTG: Propel w/c distance in controlled environment: 100 ft Note: Pt will have full time caregivers to mobilize. Pt is also expected to progress with continued  therapy from HHPT in order to improve w/c mobility LOF. Goal: LTG Patient will propel w/c in home environment (PT) Description: LTG: Patient will propel wheelchair in home environment, # of feet with assistance (PT). Outcome: Adequate for Discharge Flowsheets Taken 03/08/2023 1722 LTG: Pt will propel w/c in home environ  assist needed:: Maximal Assistance - Patient 25 - 49% Taken 03/03/2023 1609 LTG: Propel w/c distance in home environment: 50 ft Note: Pt will have full time caregivers to mobilize. Pt is also expected to progress with continued therapy from HHPT in order to improve w/c mobility LOF.   Problem: RH Balance Goal: LTG Patient will maintain dynamic standing balance (PT) Description: LTG:  Patient will maintain dynamic standing balance with assistance during mobility activities (PT) Outcome: Completed/Met Flowsheets (Taken 03/03/2023 1609) LTG: Pt will maintain dynamic standing balance during mobility activities with:: Maximal Assistance - Patient 25 - 49%   Problem: Sit to Stand Goal: LTG:  Patient will perform sit to stand with assistance level (PT) Description: LTG:  Patient will perform sit to stand with assistance level (PT) Outcome: Completed/Met Flowsheets (Taken 02/23/2023 1439 by Lucio Edward, PT) LTG: PT will perform sit to stand in preparation for functional mobility with assistance level: Moderate Assistance - Patient 50 - 74%   Problem: RH Car Transfers Goal: LTG Patient will perform car transfers with assist (PT) Description: LTG: Patient will perform car transfers with assistance (PT). Outcome: Completed/Met Flowsheets (Taken 03/03/2023 1609) LTG: Pt will perform car transfers with assist:: Maximal Assistance - Patient 25 - 49%   Problem: RH Ambulation Goal: LTG Patient will ambulate in controlled environment (PT) Description: LTG: Patient will ambulate in a controlled environment, # of feet with assistance (PT). Outcome: Completed/Met Flowsheets  (Taken 03/03/2023 1609) LTG:  Pt will ambulate in controlled environ  assist needed:: Maximal Assistance - Patient 25 - 49% LTG: Ambulation distance in controlled environment: 30 ft using LRAD

## 2023-03-12 NOTE — Progress Notes (Signed)
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

## 2023-03-16 ENCOUNTER — Telehealth: Payer: Self-pay | Admitting: Oncology

## 2023-03-16 NOTE — Telephone Encounter (Signed)
03/16/23 Spke with patients wife and daughter and scheduled PET SCAN(HP)and f/u appt with Dr Melvyn Neth.

## 2023-03-19 ENCOUNTER — Encounter: Payer: Medicare PPO | Attending: Registered Nurse | Admitting: Registered Nurse

## 2023-03-20 ENCOUNTER — Telehealth: Payer: Self-pay

## 2023-03-20 NOTE — Telephone Encounter (Signed)
 [facility] Clinical Social Work  Clinical Social Work was referred by new patient protocol for assessment of psychosocial needs.  Clinical Social Worker attempted to contact patient by phone to offer support and assess for needs.   Patient was receiving PET scan at time of call, per patient's spouse. Appointment scheduled to speak.  Lizbeth Sprague, LCSW  Clinical Social Worker [facility]

## 2023-03-23 ENCOUNTER — Inpatient Hospital Stay: Payer: Medicare PPO | Attending: Oncology

## 2023-03-23 DIAGNOSIS — C8510 Unspecified B-cell lymphoma, unspecified site: Secondary | ICD-10-CM | POA: Insufficient documentation

## 2023-03-23 NOTE — Progress Notes (Signed)
 [facility] Clinical Social Work  Initial Assessment   Jeffrey Acevedo is a 83 y.o. year old male contacted caregiver by phone. Clinical Social Work was referred by new patient protocol for assessment of psychosocial needs.   SDOH (Social Determinants of Health) assessments performed: Yes SDOH Interventions    Flowsheet Row ED to Hosp-Admission (Discharged) from 01/12/2023 in [facility] 3E HF PCU  SDOH Interventions   Alcohol Usage Interventions Intervention Not Indicated (Score <7)  Financial Strain Interventions Intervention Not Indicated       SDOH Screenings   Food Insecurity: No Food Insecurity (03/12/2023)  Housing: Low Risk  (03/12/2023)   Received from [facility]  Transportation Needs: No Transportation Needs (03/12/2023)  Recent Concern: Transportation Needs - Unmet Transportation Needs (12/12/2022)   Received from [facility]  Utilities: Not At Risk (03/12/2023)  Alcohol Screen: Low Risk  (01/15/2023)  Depression (PHQ2-9): Low Risk  (03/12/2023)  Financial Resource Strain: Low Risk  (01/15/2023)  Tobacco Use: Low Risk  (02/22/2023)     Distress Screen completed: No     No data to display            Family/Social Information:  Housing Arrangement: patient lives with with spouse. Family members/support persons in your life? Family Transportation concerns: Patient has been transported via ems due to patient's inability to walk and need to be placed in wheelchair. Patient's spouse Is looking for other ways to provide consistent transportation, unable to lift him up.   Employment: Retired .  Income source: Actor concerns: No Type of concern: None Food access concerns: no Religious or spiritual practice: Not known  Coping/ Adjustment to diagnosis: Patient understands treatment plan and what happens next? Patient and Patient's family will hear more about treatment options at next visit with medical oncologist.  Concerns  about diagnosis and/or treatment:  No concerns at this time. Patient reported stressors: Transportation Current coping skills/ strengths: Other: Patient's past stroke has impacted some of his ability to communicate. However, patient has been able to express if he is in pain or uncomfortable.     SUMMARY: Current SDOH Barriers:  No SDOH Concerns at this time.   Clinical Social Work Clinical Goal(s):  No clinical social work goals at this time  Interventions: Discussed common feeling and emotions when being diagnosed with cancer, and the importance of support during treatment Informed patient of the support team roles and support services at [facility] Provided CSW contact information and encouraged patient to call with any questions or concerns Called [employer] for suggestions for transportation assistance for those who need assistance into wheel chairs, [employer] does not provide assistance and did not have suggestions. Patient's spouse will attempt to utilize friends and family.    Follow Up Plan: Patient will contact CSW with any support or resource needs Patient verbalizes understanding of plan: Yes  Lizbeth Sprague, LCSW Clinical Social Worker [facility]

## 2023-03-25 NOTE — Progress Notes (Signed)
 [facility]  [address] [place],  [place]  [address] [phone]  Clinic Day:  03/12/2023  Referring physician: Thurmond Cathlyn LABOR., MD   HISTORY OF PRESENT ILLNESS:  The patient is a 83 y.o. 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  He comes in today to go over his PET scan images to determine how extensive his lymphoma may be.  Overall, the patient claims to be doing fairly well.  Both he and his family report that many skin lesions over his body have waxed and waned in both size and color.  He denies having any B symptoms at this time.  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  PHYSICAL EXAM:  Blood pressure (!) 153/74, pulse (!) 51, temperature 97.7 F (36.5 C), temperature source Oral, resp. rate 18, height 6' (1.829 m), SpO2 98%. Wt Readings from Last 3 Encounters:  02/20/23 177 lb 1 oz (80.3 kg)  02/02/23 181 lb (82.1 kg)  01/26/23 181 lb (82.1 kg)   Body mass index is 23.02 kg/m. Performance status (ECOG): 4 - Bedbound Physical Exam Constitutional:      Appearance: Normal appearance. He is not ill-appearing.     Comments: A pleasant older gentleman who is in a wheelchair.  HENT:     Mouth/Throat:     Mouth: Mucous membranes are moist.     Pharynx: Oropharynx is clear. No oropharyngeal exudate or posterior oropharyngeal erythema.  Cardiovascular:     Rate and Rhythm: Normal rate and regular rhythm.     Heart sounds: No murmur heard.    No friction rub. No gallop.  Pulmonary:     Effort: Pulmonary effort is normal. No respiratory distress.     Breath sounds: Normal breath sounds. No wheezing, rhonchi or rales.  Abdominal:     General: Bowel sounds are normal. There is no distension.     Palpations: Abdomen is soft. There is no mass.     Tenderness: There  is no abdominal tenderness.  Musculoskeletal:        General: No swelling.     Right upper arm: Swelling present.     Right forearm: Edema present.     Right lower leg: No edema.     Left lower leg: No edema.  Lymphadenopathy:     Cervical: No cervical adenopathy.     Upper Body:     Right upper body: No supraclavicular or axillary adenopathy.     Left upper body: No supraclavicular or axillary adenopathy.     Lower Body: No right inguinal adenopathy. No left inguinal adenopathy.  Skin:    General: Skin is warm.     Coloration: Skin is not jaundiced.     Findings: No lesion or rash.     Comments: Numerous erythematous skin lesions over his chest/abdomen.  Each lesion is firm and rubbery  Neurological:     General: No focal deficit present.     Mental Status: He is alert and oriented to person, place, and time. Mental status is at baseline.  Psychiatric:        Mood and Affect: Mood normal.        Behavior: Behavior normal.        Thought Content: Thought content normal.    SCANS:  His PET scan revealed the following: FINDINGS: Mediastinal blood  pool activity: SUV max 3.2  Liver activity: SUV max 3.7  NECK: No hypermetabolic lymphadenopathy in the neck. Multiple hypermetabolic subcutaneous nodules evident. Anterior midline nodule at the level of the hyoid bone demonstrates SUV max = 9.6.  Incidental CT findings: None.  CHEST: Multiple bilateral anterior and posterior subcutaneous/cutaneous hypermetabolic soft tissue nodules are evident.  Lesion anterior to the right shoulder measurin  1.9 cm g on image 65/4 demonstrates SUV max = 14.  Low right axillary node measuring 6 mm short axis on 72/4 is mildly FDG avid with SUV max = 2.5.. No hypermetabolic mediastinal or hilar lymphadenopathy.  11 mm deep subcutaneous nodule posterior right shoulder (73/4) is hypermetabolic with SUV max = 12.3.  No hypermetabolic pulmonary nodule or mass.  Incidental CT findings:  .Dependent atelectasis noted both lung bases. Mild atherosclerotic calcification is noted in the wall of the thoracic aorta. Coronary artery calcification is evident.  ABDOMEN/PELVIS: As above, there are multiple cutaneous and subcutaneous hypermetabolic soft tissue nodules. 2.8 cm soft tissue nodule just caudal and to the right of the umbilicus is hypermetabolic with SUV max = 16.2. Hypermetabolic lesion in the umbilicus demonstrates SUV max = 19.1.  No hypermetabolic lesions in the liver. The spleen is not hypermetabolic. No hypermetabolic abdominal lymph nodes. 9 mm short axis right groin lymph node on 178/4 shows low level FDG uptake with SUV max = 2.4, nonspecific.  Incidental CT findings: Duodenal diverticulum noted. 2.1 cm exophytic lesion upper pole right kidney is photopenic, likely a cyst. No followup imaging is recommended. There is moderate atherosclerotic calcification of the abdominal aorta without aneurysm.  SKELETON: No focal hypermetabolic activity to suggest skeletal metastasis.  Incidental CT findings: Status post right hip replacement.  IMPRESSION: 1. Multiple hypermetabolic cutaneous and subcutaneous soft tissue nodules in the neck, chest, abdomen, and pelvis compatible with biopsy-proven lymphoma. 2. Small lymph nodes in the right axilla and right groin show low level FDG accumulation, nonspecific. Attention on follow-up recommended. 3. No hypermetabolic uptake in the liver or spleen. 4. Aortic Atherosclerosis (ICD10-I70.0).   LABS:      Latest Ref Rng & Units 03/12/2023    2:43 PM 03/08/2023    5:19 AM 03/05/2023    5:15 AM  CBC  WBC 4.0 - 10.5 K/uL 9.8  9.0  8.2   Hemoglobin 13.0 - 17.0 g/dL 86.9  88.1  87.6   Hematocrit 39.0 - 52.0 % 41.0  37.0  38.8   Platelets 150 - 400 K/uL 363  349  383       Latest Ref Rng & Units 03/12/2023    2:43 PM 03/08/2023    5:19 AM 03/05/2023    5:15 AM  CMP  Glucose 70 - 99 mg/dL 892  91  92   BUN 8 - 23  mg/dL 20  19  20    Creatinine 0.61 - 1.24 mg/dL 8.86  8.93  8.92   Sodium 135 - 145 mmol/L 138  135  136   Potassium 3.5 - 5.1 mmol/L 4.3  3.7  3.7   Chloride 98 - 111 mmol/L 101  103  103   CO2 22 - 32 mmol/L 27  25  25    Calcium  8.9 - 10.3 mg/dL 89.6  9.3  9.7   Total Protein 6.5 - 8.1 g/dL 6.8     Total Bilirubin <1.2 mg/dL 0.6     Alkaline Phos 38 - 126 U/L 56     AST 15 - 41 U/L 24  ALT 0 - 44 U/L 8      Lab Results  Component Value Date   LDH 269 (H) 03/12/2023     ASSESSMENT & PLAN:  An 83 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  The stage IV designation is based upon his extensive skin metastasis.  In clinic today, I went over his PET scan images with him and his family, for which they could see the extent of spread of his lymphoma over his skin.  Ideally, I would give this gentleman R-[id number] to treat his disease.  However, I do wish to have him receive another biopsy of one of his skin lesions in order to ascertain more tissue that can be used to give me more details on what subtype of high-grade lymphoma he likely has.  Furthermore, I do wish to have this lesion checked for either double or triple hit lymphoma.  As mentioned previously, this gentleman had a stroke less than 2 months ago, which led to significant right-sided neurological deficits.  I do want him to use the next 4 weeks to physically get stronger to where he will be more equipped to handle upcoming chemotherapy.  I will have him see a local surgeon to get one of his skin lesions biopsied.  I will see him back in 4 weeks to go over his biopsy results, as well as to determine if he is ready at that time to be considered for combination chemotherapy.  Of note, he understands such treatment for his high-grade lymphoma would be theoretically given for curative intent.  The patient and his family understand all the plans discussed today and are in agreement with them.  Shantil Vallejo DELENA Kerns, MD

## 2023-03-26 ENCOUNTER — Other Ambulatory Visit: Payer: Self-pay | Admitting: Oncology

## 2023-03-26 ENCOUNTER — Inpatient Hospital Stay: Payer: Medicare PPO | Admitting: Oncology

## 2023-03-26 VITALS — BP 153/74 | HR 51 | Temp 97.7°F | Resp 18 | Ht 72.0 in

## 2023-03-26 DIAGNOSIS — C8599 Non-Hodgkin lymphoma, unspecified, extranodal and solid organ sites: Secondary | ICD-10-CM

## 2023-03-26 DIAGNOSIS — C8510 Unspecified B-cell lymphoma, unspecified site: Secondary | ICD-10-CM | POA: Diagnosis present

## 2023-03-29 ENCOUNTER — Other Ambulatory Visit: Payer: Self-pay

## 2023-03-29 NOTE — Progress Notes (Signed)
 This information is for discussion at Tumor Board only and is not a art of the official treatment plan.

## 2023-03-30 NOTE — Telephone Encounter (Addendum)
 Jessica with [facility] is requesting orders for weekly nursing/wound care. His pulse yesterday was in the 40's but did rise into the 50's, c/o being tired as well. Please advise. [phone] secure vm

## 2023-04-02 NOTE — Telephone Encounter (Signed)
 Approved this.   Nurse states he was tired that day ( Friday) as he had had many visitors. I advised her to keep me updated if any other concerns. She did not note any other s/s

## 2023-04-03 ENCOUNTER — Ambulatory Visit: Payer: Medicare PPO | Admitting: Cardiovascular Disease

## 2023-04-05 ENCOUNTER — Other Ambulatory Visit: Payer: Self-pay

## 2023-04-05 DIAGNOSIS — C8599 Non-Hodgkin lymphoma, unspecified, extranodal and solid organ sites: Secondary | ICD-10-CM

## 2023-04-06 ENCOUNTER — Other Ambulatory Visit (HOSPITAL_COMMUNITY): Payer: Self-pay

## 2023-04-09 ENCOUNTER — Other Ambulatory Visit (HOSPITAL_BASED_OUTPATIENT_CLINIC_OR_DEPARTMENT_OTHER): Payer: Self-pay

## 2023-04-11 ENCOUNTER — Other Ambulatory Visit (HOSPITAL_COMMUNITY): Payer: Self-pay

## 2023-04-13 ENCOUNTER — Other Ambulatory Visit (HOSPITAL_BASED_OUTPATIENT_CLINIC_OR_DEPARTMENT_OTHER): Payer: Self-pay

## 2023-04-13 ENCOUNTER — Other Ambulatory Visit (HOSPITAL_COMMUNITY): Payer: Self-pay

## 2023-04-13 MED ORDER — SPIRONOLACTONE 25 MG PO TABS
12.5000 mg | ORAL_TABLET | Freq: Every day | ORAL | 3 refills | Status: DC
  Filled 2023-04-13 – 2023-09-24 (×2): qty 45, 90d supply, fill #0

## 2023-04-13 MED ORDER — METOPROLOL TARTRATE 25 MG PO TABS
25.0000 mg | ORAL_TABLET | Freq: Every day | ORAL | 3 refills | Status: DC
  Filled 2023-04-13: qty 90, 90d supply, fill #0
  Filled 2023-07-24: qty 90, 90d supply, fill #1
  Filled 2023-10-16: qty 10, 10d supply, fill #2
  Filled 2023-10-16: qty 80, 80d supply, fill #2
  Filled 2024-01-17 – 2024-02-12 (×3): qty 90, 90d supply, fill #3

## 2023-04-13 MED ORDER — ATORVASTATIN CALCIUM 40 MG PO TABS
40.0000 mg | ORAL_TABLET | Freq: Every day | ORAL | 3 refills | Status: DC
  Filled 2023-04-13: qty 90, 90d supply, fill #0
  Filled 2023-07-23: qty 90, 90d supply, fill #1
  Filled 2023-10-16: qty 90, 90d supply, fill #2
  Filled 2024-01-17 – 2024-02-12 (×3): qty 90, 90d supply, fill #3

## 2023-04-13 MED ORDER — FERREX 150 PLUS 150-50-50 MG PO CAPS
1.0000 | ORAL_CAPSULE | Freq: Every day | ORAL | 1 refills | Status: DC
  Filled 2023-04-13: qty 90, 90d supply, fill #0
  Filled 2023-07-23: qty 90, 90d supply, fill #1

## 2023-04-13 MED ORDER — JARDIANCE 10 MG PO TABS
10.0000 mg | ORAL_TABLET | Freq: Every day | ORAL | 3 refills | Status: DC
  Filled 2023-04-13: qty 90, 90d supply, fill #0
  Filled 2023-05-25 – 2023-07-23 (×2): qty 90, 90d supply, fill #1
  Filled 2023-10-16: qty 90, 90d supply, fill #2
  Filled 2024-01-17 – 2024-01-18 (×2): qty 90, 90d supply, fill #3

## 2023-04-16 ENCOUNTER — Other Ambulatory Visit (HOSPITAL_COMMUNITY)
Admission: RE | Admit: 2023-04-16 | Discharge: 2023-04-16 | Disposition: A | Discharge status: Home / Self Care | Payer: Medicare PPO | Source: Ambulatory Visit | Attending: Surgery | Admitting: Surgery

## 2023-04-16 ENCOUNTER — Other Ambulatory Visit (HOSPITAL_BASED_OUTPATIENT_CLINIC_OR_DEPARTMENT_OTHER): Payer: Self-pay

## 2023-04-16 DIAGNOSIS — D219 Benign neoplasm of connective and other soft tissue, unspecified: Secondary | ICD-10-CM | POA: Insufficient documentation

## 2023-04-16 DIAGNOSIS — C8599 Non-Hodgkin lymphoma, unspecified, extranodal and solid organ sites: Secondary | ICD-10-CM | POA: Insufficient documentation

## 2023-04-16 MED ORDER — OXYCODONE HCL 5 MG PO TABS
5.0000 mg | ORAL_TABLET | ORAL | 0 refills | Status: DC
  Filled 2023-04-16: qty 10, 2d supply, fill #0

## 2023-04-20 LAB — SURGICAL PATHOLOGY

## 2023-04-23 ENCOUNTER — Inpatient Hospital Stay: Payer: Medicare PPO

## 2023-04-23 ENCOUNTER — Inpatient Hospital Stay: Payer: Medicare PPO | Admitting: Oncology

## 2023-04-24 ENCOUNTER — Other Ambulatory Visit (HOSPITAL_COMMUNITY): Payer: Self-pay

## 2023-05-01 ENCOUNTER — Encounter: Payer: Self-pay | Admitting: Oncology

## 2023-05-01 ENCOUNTER — Inpatient Hospital Stay: Payer: Medicare PPO | Attending: Oncology

## 2023-05-01 ENCOUNTER — Inpatient Hospital Stay: Payer: Medicare PPO | Admitting: Oncology

## 2023-05-01 VITALS — BP 144/79 | HR 50 | Temp 98.1°F | Resp 14 | Ht 72.0 in

## 2023-05-01 DIAGNOSIS — Z5111 Encounter for antineoplastic chemotherapy: Secondary | ICD-10-CM | POA: Diagnosis present

## 2023-05-01 DIAGNOSIS — D701 Agranulocytosis secondary to cancer chemotherapy: Secondary | ICD-10-CM | POA: Insufficient documentation

## 2023-05-01 DIAGNOSIS — Z8673 Personal history of transient ischemic attack (TIA), and cerebral infarction without residual deficits: Secondary | ICD-10-CM | POA: Diagnosis not present

## 2023-05-01 DIAGNOSIS — C8510 Unspecified B-cell lymphoma, unspecified site: Secondary | ICD-10-CM | POA: Diagnosis present

## 2023-05-01 DIAGNOSIS — C8599 Non-Hodgkin lymphoma, unspecified, extranodal and solid organ sites: Secondary | ICD-10-CM

## 2023-05-01 DIAGNOSIS — Z5112 Encounter for antineoplastic immunotherapy: Secondary | ICD-10-CM | POA: Diagnosis present

## 2023-05-01 DIAGNOSIS — H9190 Unspecified hearing loss, unspecified ear: Secondary | ICD-10-CM | POA: Diagnosis not present

## 2023-05-01 LAB — CMP (CANCER CENTER ONLY)
ALT: 7 U/L (ref 0–44)
AST: 15 U/L (ref 15–41)
Albumin: 4.1 g/dL (ref 3.5–5.0)
Alkaline Phosphatase: 74 U/L (ref 38–126)
Anion gap: 11 (ref 5–15)
BUN: 12 mg/dL (ref 8–23)
CO2: 27 mmol/L (ref 22–32)
Calcium: 10 mg/dL (ref 8.9–10.3)
Chloride: 102 mmol/L (ref 98–111)
Creatinine: 0.88 mg/dL (ref 0.61–1.24)
GFR, Estimated: >60 mL/min (ref >60)
Glucose, Bld: 99 mg/dL (ref 70–99)
Potassium: 4.1 mmol/L (ref 3.5–5.1)
Sodium: 140 mmol/L (ref 135–145)
Total Bilirubin: 0.5 mg/dL (ref 0.0–1.2)
Total Protein: 7.1 g/dL (ref 6.5–8.1)

## 2023-05-01 LAB — CBC WITH DIFFERENTIAL (CANCER CENTER ONLY)
Abs Immature Granulocytes: 0.06 10*3/uL (ref 0.00–0.07)
Basophils Absolute: 0.1 10*3/uL (ref 0.0–0.1)
Basophils Relative: 1 %
Eosinophils Absolute: 1 10*3/uL — ABNORMAL HIGH (ref 0.0–0.5)
Eosinophils Relative: 8 %
HCT: 46.2 % (ref 39.0–52.0)
Hemoglobin: 14.9 g/dL (ref 13.0–17.0)
Immature Granulocytes: 1 %
Lymphocytes Relative: 11 %
Lymphs Abs: 1.3 10*3/uL (ref 0.7–4.0)
MCH: 25.5 pg — ABNORMAL LOW (ref 26.0–34.0)
MCHC: 32.3 g/dL (ref 30.0–36.0)
MCV: 79 fL — ABNORMAL LOW (ref 80.0–100.0)
Monocytes Absolute: 0.9 10*3/uL (ref 0.1–1.0)
Monocytes Relative: 7 %
Neutro Abs: 8.7 10*3/uL — ABNORMAL HIGH (ref 1.7–7.7)
Neutrophils Relative %: 72 %
Platelet Count: 443 10*3/uL — ABNORMAL HIGH (ref 150–400)
RBC: 5.85 MIL/uL — ABNORMAL HIGH (ref 4.22–5.81)
RDW: 16.5 % — ABNORMAL HIGH (ref 11.5–15.5)
WBC Count: 12.1 10*3/uL — ABNORMAL HIGH (ref 4.0–10.5)
nRBC: 0 % (ref 0.0–0.2)
nRBC: 0 /100{WBCs}

## 2023-05-01 LAB — LACTATE DEHYDROGENASE: LDH: 166 U/L (ref 98–192)

## 2023-05-01 NOTE — Progress Notes (Unsigned)
[facility]  [address] [place],  [place]  [address] [phone]  Clinic Day:  05/01/2023  Referring physician: [name], MD   HISTORY OF PRESENT ILLNESS:  The patient is an [age] y.o. 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  He comes in today to go over the results of the soft tissue mass biopsy of his right leg..  Of note, a previous PET scan showed his high-grade lymphoma dispersed over numerous areas of the skin.  At prior visits, many of these lesions were erythematous and highly conspicuous.  However, both the patient and his family state that many of these lesions have dissipated over time. He is not having any B symptoms at this time.  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  He is currently undergoing therapy which has led to a modest return in function of the right side of his body.    PHYSICAL EXAM:  Blood pressure (!) 144/79, pulse (!) 50, temperature 98.1 F (36.7 C), temperature source Oral, resp. rate 14, height 6' (1.829 m), SpO2 95%. Wt Readings from Last 3 Encounters:  02/20/23 177 lb 1 oz (80.3 kg)  02/02/23 181 lb (82.1 kg)  01/26/23 181 lb (82.1 kg)   Body mass index is 23.02 kg/m. Performance status (ECOG): 4 - Bedbound Physical Exam Constitutional:      Appearance: Normal appearance. He is not ill-appearing.     Comments: A pleasant older gentleman who is in a wheelchair.  HENT:     Mouth/Throat:     Mouth: Mucous membranes are moist.     Pharynx: Oropharynx is clear. No oropharyngeal exudate or posterior oropharyngeal erythema.  Cardiovascular:     Rate and Rhythm: Normal rate and regular rhythm.     Heart sounds: No murmur heard.    No friction rub. No gallop.  Pulmonary:     Effort:  Pulmonary effort is normal. No respiratory distress.     Breath sounds: Normal breath sounds. No wheezing, rhonchi or rales.  Abdominal:     General: Bowel sounds are normal. There is no distension.     Palpations: Abdomen is soft. There is no mass.     Tenderness: There is no abdominal tenderness.  Musculoskeletal:        General: No swelling.     Right upper arm: Swelling present.     Right forearm: Edema present.     Right lower leg: No edema.     Left lower leg: No edema.  Lymphadenopathy:     Cervical: No cervical adenopathy.     Upper Body:     Right upper body: No supraclavicular or axillary adenopathy.     Left upper body: No supraclavicular or axillary adenopathy.     Lower Body: No right inguinal adenopathy. No left inguinal adenopathy.  Skin:    General: Skin is warm.     Coloration: Skin is not jaundiced.     Findings: No lesion or rash.     Comments: Very faint remnants of his previously large skin lesions are present  Neurological:     General: No focal deficit present.     Mental Status: He is alert and oriented to person, place, and time. Mental status is at baseline.  Psychiatric:        Mood and Affect: Mood normal.        Behavior: Behavior normal.        Thought Content: Thought content normal.   PATHOLOGY:  SCANS:  His PET scan revealed the following: FINDINGS: Mediastinal blood pool activity: SUV max 3.2  Liver activity: SUV max 3.7  NECK: No hypermetabolic lymphadenopathy in the neck. Multiple hypermetabolic subcutaneous nodules evident. Anterior midline nodule at the level of the hyoid bone demonstrates SUV max = 9.6.  Incidental CT findings: None.  CHEST: Multiple bilateral anterior and posterior subcutaneous/cutaneous hypermetabolic soft tissue nodules are evident.  Lesion anterior to the right shoulder measurin 1.9 cm g on image 65/4 demonstrates SUV max = 14.  Low right axillary node measuring 6 mm short axis on 72/4 is mildly FDG avid  with SUV max = 2.5.. No hypermetabolic mediastinal or hilar lymphadenopathy.  11 mm deep subcutaneous nodule posterior right shoulder (73/4) is hypermetabolic with SUV max = 12.3.  No hypermetabolic pulmonary nodule or mass.  Incidental CT findings: .Dependent atelectasis noted both lung bases. Mild atherosclerotic calcification is noted in the wall of the thoracic aorta. Coronary artery calcification is evident.  ABDOMEN/PELVIS: As above, there are multiple cutaneous and subcutaneous hypermetabolic soft tissue nodules. 2.8 cm soft tissue nodule just caudal and to the right of the umbilicus is hypermetabolic with SUV max = 16.2. Hypermetabolic lesion in the umbilicus demonstrates SUV max = 19.1.  No hypermetabolic lesions in the liver. The spleen is not hypermetabolic. No hypermetabolic abdominal lymph nodes. 9 mm short axis right groin lymph node on 178/4 shows low level FDG uptake with SUV max = 2.4, nonspecific.  Incidental CT findings: Duodenal diverticulum noted. 2.1 cm exophytic lesion upper pole right kidney is photopenic, likely a cyst. No followup imaging is recommended. There is moderate atherosclerotic calcification of the abdominal aorta without aneurysm.  SKELETON: No focal hypermetabolic activity to suggest skeletal metastasis.  Incidental CT findings: Status post right hip replacement.  IMPRESSION: 1. Multiple hypermetabolic cutaneous and subcutaneous soft tissue nodules in the neck, chest, abdomen, and pelvis compatible with biopsy-proven lymphoma. 2. Small lymph nodes in the right axilla and right groin show low level FDG accumulation, nonspecific. Attention on follow-up recommended. 3. No hypermetabolic uptake in the liver or spleen. 4. Aortic Atherosclerosis (ICD10-I70.0).   LABS:      Latest Ref Rng & Units 05/01/2023    1:55 PM 03/12/2023    2:43 PM 03/08/2023    5:19 AM  CBC  WBC 4.0 - 10.5 K/uL 12.1  9.8  9.0   Hemoglobin 13.0 - 17.0 g/dL  86.5  78.4  69.6   Hematocrit 39.0 - 52.0 % 46.2  41.0  37.0   Platelets 150 - 400 K/uL 443  363  349       Latest Ref Rng & Units 05/01/2023    1:55 PM 03/12/2023    2:43 PM 03/08/2023    5:19 AM  CMP  Glucose 70 - 99 mg/dL 99  295  91   BUN 8 - 23 mg/dL 12  20  19    Creatinine 0.61 - 1.24 mg/dL 2.84  1.32  4.40   Sodium 135 - 145 mmol/L 140  138  135   Potassium 3.5 - 5.1 mmol/L 4.1  4.3  3.7   Chloride 98 - 111 mmol/L 102  101  103   CO2 22 - 32 mmol/L 27  27  25    Calcium 8.9 -  10.3 mg/dL 91.4  78.2  9.3   Total Protein 6.5 - 8.1 g/dL 7.1  6.8    Total Bilirubin 0.0 - 1.2 mg/dL 0.5  0.6    Alkaline Phos 38 - 126 U/L 74  56    AST 15 - 41 U/L 15  24    ALT 0 - 44 U/L 7  8      Latest Reference Range & Units 03/12/23 14:43 05/01/23 13:55  LDH 98 - 192 U/L 269 (H) 166  (H): Data is abnormally high  ASSESSMENT & PLAN:  An 83 y.o. 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  The stage IV designation is based upon his extensive skin metastasis.  In clinic today, I went over the pathology with the patient and his family.  His pathology is very concerning for his disease being aggressive.  Despite this, I am particularly struck as his previously numerous skin lesions have dissipated.  Furthermore, his LDH level has fallen to normal over these past weeks.  All of this has occurred with no therapy being given.  However, this gentleman does need some form of systemic therapy.  Usually, such disease requires aggressive therapy as R-CHOP or R-EPOCH.  With his recent stroke and other comorbidities, it is very unlikely he could withstand such demanding regimens.  I am even concerned that R-mini-CHOP may be too challenging for him.  I do believe a gentler regimen in the form of Bendamustine/Rituxan could help keep this disease under some semblance of control without his daily quality of life being severely impacted.  Bendamustine will be given on days 1  and 2; Rituxan will be given on day 1.  Each cycle will be repeated every 4 weeks, with the goal being for him to receive 6 total cycles.  The patient was made aware of the side effects which  can go along with this regimen, including prolonged leukopenia, fatigue, and nausea.  Overall, I do believe this patient will tolerate this regimen fairly well.  I will arrange for this gentleman to have a port placed through which all of his treatment will be given.  His first cycle of Bendamustine/Rituxan will be tentatively scheduled for Tuesday, February 25th.  I will see him back 4 weeks later before we heads into his second cycle of Bendamustine/Rituxan.  The patient and his family understand all the plans discussed today and are in agreement with them.  Awais Cobarrubias Kirby Funk, MD

## 2023-05-02 ENCOUNTER — Encounter: Payer: Self-pay | Admitting: Oncology

## 2023-05-02 ENCOUNTER — Telehealth: Payer: Self-pay | Admitting: Oncology

## 2023-05-02 NOTE — Telephone Encounter (Signed)
Contacted pt to schedule an appt for Chemo Ed and initial Tx. Unable to reach via phone, voicemail was left.

## 2023-05-02 NOTE — Progress Notes (Incomplete)
[facility]  [address] [place],  [place]  [address] [phone]  Clinic Day:  05/01/2023  Referring physician: [name], MD   HISTORY OF PRESENT ILLNESS:  The patient is an 83 y.o. 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  He comes in today to go over the results of the soft tissue biopsy of the right leg mass.  Of note, a previous PET scan showed his high-grade lymphoma dispersed over numerous areas of the skin.  At prior visit, many of these lesions were erythematous and highly deficient with.  However, both the patient and his family state that many of these lesions have defervesced over time. The patient claims he has not really seen any of a large skin lesions over his body since the punch biopsy was done from his right leg mass.  He is not having any B symptoms at this time.  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  He is currently undergoing therapy which is led to a modest return in the faculties of the right side of his body.    PHYSICAL EXAM:  There were no vitals taken for this visit. Wt Readings from Last 3 Encounters:  02/20/23 177 lb 1 oz (80.3 kg)  02/02/23 181 lb (82.1 kg)  01/26/23 181 lb (82.1 kg)   There is no height or weight on file to calculate BMI. Performance status (ECOG): 4 - Bedbound Physical Exam Constitutional:      Appearance: Normal appearance. He is not ill-appearing.     Comments: A pleasant older gentleman who is in a wheelchair.  HENT:     Mouth/Throat:     Mouth: Mucous membranes are moist.     Pharynx: Oropharynx is clear. No oropharyngeal exudate or posterior oropharyngeal erythema.  Cardiovascular:     Rate and Rhythm: Normal rate and regular rhythm.     Heart sounds: No murmur  heard.    No friction rub. No gallop.  Pulmonary:     Effort: Pulmonary effort is normal. No respiratory distress.     Breath sounds: Normal breath sounds. No wheezing, rhonchi or rales.  Abdominal:     General: Bowel sounds are normal. There is no distension.     Palpations: Abdomen is soft. There is no mass.     Tenderness: There is no abdominal tenderness.  Musculoskeletal:        General: No swelling.     Right upper arm: Swelling present.     Right forearm: Edema present.     Right lower leg: No edema.     Left lower leg: No edema.  Lymphadenopathy:     Cervical: No cervical adenopathy.     Upper Body:     Right upper body: No supraclavicular or axillary adenopathy.     Left upper body: No supraclavicular or axillary adenopathy.     Lower Body: No right inguinal adenopathy. No left inguinal adenopathy.  Skin:    General: Skin is warm.     Coloration: Skin is not jaundiced.     Findings: No lesion or rash.     Comments: Numerous erythematous skin lesions over his chest/abdomen.  Each lesion is firm and rubbery  Neurological:     General: No focal  deficit present.     Mental Status: He is alert and oriented to person, place, and time. Mental status is at baseline.  Psychiatric:        Mood and Affect: Mood normal.        Behavior: Behavior normal.        Thought Content: Thought content normal.   PATHOLOGY:  SCANS:  His PET scan revealed the following: FINDINGS: Mediastinal blood pool activity: SUV max 3.2  Liver activity: SUV max 3.7  NECK: No hypermetabolic lymphadenopathy in the neck. Multiple hypermetabolic subcutaneous nodules evident. Anterior midline nodule at the level of the hyoid bone demonstrates SUV max = 9.6.  Incidental CT findings: None.  CHEST: Multiple bilateral anterior and posterior subcutaneous/cutaneous hypermetabolic soft tissue nodules are evident.  Lesion anterior to the right shoulder measurin 1.9 cm g on image 65/4 demonstrates SUV max  = 14.  Low right axillary node measuring 6 mm short axis on 72/4 is mildly FDG avid with SUV max = 2.5.. No hypermetabolic mediastinal or hilar lymphadenopathy.  11 mm deep subcutaneous nodule posterior right shoulder (73/4) is hypermetabolic with SUV max = 12.3.  No hypermetabolic pulmonary nodule or mass.  Incidental CT findings: .Dependent atelectasis noted both lung bases. Mild atherosclerotic calcification is noted in the wall of the thoracic aorta. Coronary artery calcification is evident.  ABDOMEN/PELVIS: As above, there are multiple cutaneous and subcutaneous hypermetabolic soft tissue nodules. 2.8 cm soft tissue nodule just caudal and to the right of the umbilicus is hypermetabolic with SUV max = 16.2. Hypermetabolic lesion in the umbilicus demonstrates SUV max = 19.1.  No hypermetabolic lesions in the liver. The spleen is not hypermetabolic. No hypermetabolic abdominal lymph nodes. 9 mm short axis right groin lymph node on 178/4 shows low level FDG uptake with SUV max = 2.4, nonspecific.  Incidental CT findings: Duodenal diverticulum noted. 2.1 cm exophytic lesion upper pole right kidney is photopenic, likely a cyst. No followup imaging is recommended. There is moderate atherosclerotic calcification of the abdominal aorta without aneurysm.  SKELETON: No focal hypermetabolic activity to suggest skeletal metastasis.  Incidental CT findings: Status post right hip replacement.  IMPRESSION: 1. Multiple hypermetabolic cutaneous and subcutaneous soft tissue nodules in the neck, chest, abdomen, and pelvis compatible with biopsy-proven lymphoma. 2. Small lymph nodes in the right axilla and right groin show low level FDG accumulation, nonspecific. Attention on follow-up recommended. 3. No hypermetabolic uptake in the liver or spleen. 4. Aortic Atherosclerosis (ICD10-I70.0).   LABS:      Latest Ref Rng & Units 03/12/2023    2:43 PM 03/08/2023    5:19 AM 03/05/2023     5:15 AM  CBC  WBC 4.0 - 10.5 K/uL 9.8  9.0  8.2   Hemoglobin 13.0 - 17.0 g/dL 16.1  09.6  04.5   Hematocrit 39.0 - 52.0 % 41.0  37.0  38.8   Platelets 150 - 400 K/uL 363  349  383       Latest Ref Rng & Units 03/12/2023    2:43 PM 03/08/2023    5:19 AM 03/05/2023    5:15 AM  CMP  Glucose 70 - 99 mg/dL 409  91  92   BUN 8 - 23 mg/dL 20  19  20    Creatinine 0.61 - 1.24 mg/dL 8.11  9.14  7.82   Sodium 135 - 145 mmol/L 138  135  136   Potassium 3.5 - 5.1 mmol/L 4.3  3.7  3.7   Chloride 98 -  111 mmol/L 101  103  103   CO2 22 - 32 mmol/L 27  25  25    Calcium 8.9 - 10.3 mg/dL 28.4  9.3  9.7   Total Protein 6.5 - 8.1 g/dL 6.8     Total Bilirubin <1.2 mg/dL 0.6     Alkaline Phos 38 - 126 U/L 56     AST 15 - 41 U/L 24     ALT 0 - 44 U/L 8      Lab Results  Component Value Date   LDH 269 (H) 03/12/2023     ASSESSMENT & PLAN:  An 83 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  The stage IV designation is based upon his extensive skin metastasis.  In clinic today, I went over his PET scan images with him and his family, for which they could see the extent of spread of his lymphoma over his skin.  Ideally, I would give this gentleman R-mini-CHOP to treat his disease.  However, I do wish to have him receive another biopsy of one of his skin lesions in order to ascertain more tissue that can be used to give me more details on what subtype of high-grade lymphoma he likely has.  Furthermore, I do wish to have this lesion checked for either double or triple hit lymphoma.  As mentioned previously, this gentleman had a stroke less than 2 months ago, which led to significant right-sided neurological deficits.  I do want him to use the next 4 weeks to physically get stronger to where he will be more equipped to handle upcoming chemotherapy.  I will have him see a local surgeon to get one of his skin lesions biopsied.  I will see him back in 4 weeks to go over his biopsy results, as  well as to determine if he is ready at that time to be considered for combination chemotherapy.  Of note, he understands such treatment for his high-grade lymphoma would be theoretically given for curative intent.  The patient and his family understand all the plans discussed today and are in agreement with them.  Jaliza Seifried Kirby Funk, MD

## 2023-05-03 ENCOUNTER — Other Ambulatory Visit: Payer: Self-pay

## 2023-05-05 ENCOUNTER — Encounter: Payer: Self-pay | Admitting: Oncology

## 2023-05-05 ENCOUNTER — Other Ambulatory Visit: Payer: Self-pay

## 2023-05-07 ENCOUNTER — Encounter: Payer: Self-pay | Admitting: Hematology and Oncology

## 2023-05-07 ENCOUNTER — Encounter: Payer: Self-pay | Admitting: Oncology

## 2023-05-07 ENCOUNTER — Inpatient Hospital Stay: Payer: Medicare PPO

## 2023-05-07 ENCOUNTER — Other Ambulatory Visit: Payer: Self-pay | Admitting: Hematology and Oncology

## 2023-05-07 ENCOUNTER — Inpatient Hospital Stay (HOSPITAL_BASED_OUTPATIENT_CLINIC_OR_DEPARTMENT_OTHER): Payer: Medicare PPO | Admitting: Hematology and Oncology

## 2023-05-07 VITALS — BP 135/80 | HR 52 | Temp 97.6°F | Resp 18 | Ht 72.0 in

## 2023-05-07 DIAGNOSIS — C8599 Non-Hodgkin lymphoma, unspecified, extranodal and solid organ sites: Secondary | ICD-10-CM

## 2023-05-07 DIAGNOSIS — Z5112 Encounter for antineoplastic immunotherapy: Secondary | ICD-10-CM | POA: Diagnosis not present

## 2023-05-07 LAB — CMP (CANCER CENTER ONLY)
ALT: 10 U/L (ref 0–44)
AST: 19 U/L (ref 15–41)
Albumin: 3.9 g/dL (ref 3.5–5.0)
Alkaline Phosphatase: 72 U/L (ref 38–126)
Anion gap: 10 (ref 5–15)
BUN: 13 mg/dL (ref 8–23)
CO2: 27 mmol/L (ref 22–32)
Calcium: 9.7 mg/dL (ref 8.9–10.3)
Chloride: 101 mmol/L (ref 98–111)
Creatinine: 0.88 mg/dL (ref 0.61–1.24)
GFR, Estimated: >60 mL/min (ref >60)
Glucose, Bld: 121 mg/dL — ABNORMAL HIGH (ref 70–99)
Potassium: 3.8 mmol/L (ref 3.5–5.1)
Sodium: 137 mmol/L (ref 135–145)
Total Bilirubin: 0.6 mg/dL (ref 0.0–1.2)
Total Protein: 6.8 g/dL (ref 6.5–8.1)

## 2023-05-07 LAB — CBC WITH DIFFERENTIAL (CANCER CENTER ONLY)
Abs Immature Granulocytes: 0.07 10*3/uL (ref 0.00–0.07)
Basophils Absolute: 0.1 10*3/uL (ref 0.0–0.1)
Basophils Relative: 1 %
Eosinophils Absolute: 0.7 10*3/uL — ABNORMAL HIGH (ref 0.0–0.5)
Eosinophils Relative: 5 %
HCT: 44.9 % (ref 39.0–52.0)
Hemoglobin: 14.3 g/dL (ref 13.0–17.0)
Immature Granulocytes: 1 %
Lymphocytes Relative: 8 %
Lymphs Abs: 1.1 10*3/uL (ref 0.7–4.0)
MCH: 25.4 pg — ABNORMAL LOW (ref 26.0–34.0)
MCHC: 31.8 g/dL (ref 30.0–36.0)
MCV: 79.6 fL — ABNORMAL LOW (ref 80.0–100.0)
Monocytes Absolute: 1.1 10*3/uL — ABNORMAL HIGH (ref 0.1–1.0)
Monocytes Relative: 8 %
Neutro Abs: 10.6 10*3/uL — ABNORMAL HIGH (ref 1.7–7.7)
Neutrophils Relative %: 77 %
Platelet Count: 305 10*3/uL (ref 150–400)
RBC: 5.64 MIL/uL (ref 4.22–5.81)
RDW: 16.3 % — ABNORMAL HIGH (ref 11.5–15.5)
WBC Count: 13.6 10*3/uL — ABNORMAL HIGH (ref 4.0–10.5)
nRBC: 0 % (ref 0.0–0.2)
nRBC: 0 /100{WBCs}

## 2023-05-07 LAB — HEPATITIS B SURFACE ANTIGEN: Hepatitis B Surface Ag: NONREACTIVE

## 2023-05-07 NOTE — Progress Notes (Unsigned)
 [facility] CARE CLINIC CONSULT NOTE [facility] [place] [phone]   [phone]   PATIENT CARE TEAM: Patient Care Team: [name], MD as PCP - General (Internal Medicine) Runell Gess, MD as PCP - Cardiology (Cardiology)   Name of the patient: Jeffrey Acevedo  [id number]  83   Date of visit: 05/07/23  Diagnosis:  Non-Hodgkin Lymphoma  Chief complaint/Reason for visit- Initial Meeting for [facility], preparing for starting chemotherapy  Heme/Onc history:  Oncology History  Non-Hodgkin lymphoma of skin (HCC)  02/28/2023 Initial Diagnosis   Non-Hodgkin lymphoma of skin (HCC)   05/15/2023 -  Chemotherapy   Patient is on Treatment Plan : NON-HODGKINS LYMPHOMA Rituximab D1 + Bendamustine D1,2 q28d x 6 cycles       Interval history-  The patient presents to chemo care clinic today for initial meeting in preparation for starting chemotherapy. I introduced the chemo care clinic and we discussed that the role of the clinic is to assist those who are at an increased risk of emergency room visits and/or complications during the course of chemotherapy treatment. We discussed that the increased risk takes into account factors such as age, performance status, and co-morbidities. We also discussed that for some, this might include barriers to care such as not having a primary care provider, lack of insurance/transportation, or not being able to afford medications. We discussed that the goal of the program is to help prevent unplanned ER visits and help reduce complications during chemotherapy. We do this by discussing specific risk factors to each individual and identifying ways that we can help improve these risk factors and reduce barriers to care.   Allergies  Allergen Reactions   Adhesive [Tape] Other (See Comments)    Mainly just redness.   Has been ruled out for latex allegery   Hydrochlorothiazide Other (See Comments)    H/o  Pancreatitis, takes in Avalide at home   Latex Other (See Comments)    unknown   Vantin [Cefpodoxime] Rash    Past Medical History:  Diagnosis Date   Acute on chronic diastolic CHF (congestive heart failure) (HCC) 01/03/2023   Cancer (HCC)    prostate cancer surgery 2011   Chronic diastolic heart failure (HCC)    Coronary artery disease    sees Dr. Erlene Quan 3 mths ago.  Stress test 03/31/2015 in epic   Degenerative joint disease    Right hip   Depression    Hearing loss of both ears    only at present wears the right ear hearing aid   Hyperlipidemia    Hypertension    Prostate cancer [facility])     Past Surgical History:  Procedure Laterality Date   CARDIAC CATHETERIZATION  06/17/2009   Proximal LAD 90% stenosis just beyond previously stented segment with a Multi-Link Vision bare-metal stent   CORONARY ANGIOPLASTY     stents placed in 1998 and 2011    CORONARY PRESSURE/FFR STUDY N/A 05/02/2021   Procedure: INTRAVASCULAR PRESSURE WIRE/FFR STUDY;  Surgeon: Runell Gess, MD;  [facility] INV[facility] LAB;  Service: Cardiovascular;  Laterality: N/A;   CORONARY PRESSURE/FFR STUDY N/A 09/19/2021   Procedure: INTRAVASCULAR PRESSURE WIRE/FFR STUDY;  Surgeon: Runell Gess, MD;  [facility];  Service: Cardiovascular;  Laterality: N/A;  RAMUS   CORONARY STENT INTERVENTION N/A 05/02/2021   Procedure: CORONARY STENT INTERVENTION;  Surgeon: Runell Gess, MD;  [facility];  Service: Cardiovascular;  Laterality: N/A;  CORONARY STENT INTERVENTION N/A 09/19/2021   Procedure: CORONARY STENT INTERVENTION;  Surgeon: Runell Gess, MD;  [facility];  Service: Cardiovascular;  Laterality: N/A;   CORONARY STENT INTERVENTION N/A 12/29/2022   Procedure: CORONARY STENT INTERVENTION;  Surgeon: Kathleene Hazel, MD;  [facility];  Service: Cardiovascular;  Laterality: N/A;   LEFT HEART CATH AND CORONARY ANGIOGRAPHY N/A  05/02/2021   Procedure: LEFT HEART CATH AND CORONARY ANGIOGRAPHY;  Surgeon: Runell Gess, MD;  [facility];  Service: Cardiovascular;  Laterality: N/A;   LEFT HEART CATH AND CORONARY ANGIOGRAPHY N/A 09/19/2021   Procedure: LEFT HEART CATH AND CORONARY ANGIOGRAPHY;  Surgeon: Runell Gess, MD;  [facility];  Service: Cardiovascular;  Laterality: N/A;   LYMPH NODE BIOPSY     NASAL SEPTUM SURGERY     Dr. Richardson Landry @ [facility]  2014   NM MYOVIEW LTD  03/20/2009   RIGHT/LEFT HEART CATH AND CORONARY ANGIOGRAPHY N/A 12/29/2022   Procedure: RIGHT/LEFT HEART CATH AND CORONARY ANGIOGRAPHY;  Surgeon: Kathleene Hazel, MD;  [facility];  Service: Cardiovascular;  Laterality: N/A;   ROBOT ASSISTED LAPAROSCOPIC RADICAL PROSTATECTOMY     TOTAL HIP ARTHROPLASTY Right 04/19/2015   Procedure: RIGHT TOTAL HIP ARTHROPLASTY ANTERIOR APPROACH;  Surgeon: Jodi Geralds, MD;  [facility];  Service: Orthopedics;  Laterality: Right;    Social History   Socioeconomic History   Marital status: Married    Spouse name: KATHY   Number of children: 2   Years of education: 12 + 4   Highest education level: Not on file  Occupational History   Occupation: RETIRED MIDDLE SCHOOL TEACHER / COACH  Tobacco Use   Smoking status: Never   Smokeless tobacco: Never  Vaping Use   Vaping status: Never Used  Substance and Sexual Activity   Alcohol use: No   Drug use: No   Sexual activity: Not Currently  Other Topics Concern   Not on file  Social History Narrative   Not on file   Social Drivers of Health   Financial Resource Strain: Low Risk  (82   Overall Financial Resource Strain (CARDIA)    Difficulty of Paying Living Expenses: Not very hard  Food Insecurity: No Food Insecurity (82   Hunger Vital Sign    Worried About Running Out of Food in the Last Year: Never true    Ran Out of Food in the Last Year: Never true   Transportation Needs: No Transportation Needs (82   PRAPARE - Administrator, Civil Service (Medical): No    Lack of Transportation (Non-Medical): No  Recent Concern: Transportation Needs - Unmet Transportation Needs (12/12/2022)   [facility]   Transportation    In the past 12 months, has lack of reliable transportation kept you from medical appointments, meetings, work or from getting things needed for daily living? : Yes  Physical Activity: Not on file  Stress: Not on file  Social Connections: Not on file  Intimate Partner Violence: Not At Risk (83   Humiliation, Afraid, Rape, and Kick questionnaire    Fear of Current or Ex-Partner: No    Emotionally Abused: No    Physically Abused: No    Sexually Abused: No    Family History  Problem Relation Age of Onset   Hypertension Mother    Heart disease Father    Hypertension Father    Leukemia  Father    Pancreatic cancer Brother    Hypertension Brother    Heart disease Brother    Heart attack Brother      Current Outpatient Medications:    aspirin EC 81 MG tablet, Take 1 tablet (81 mg total) by mouth daily. Swallow whole., Disp: 90 tablet, Rfl: 0   atorvastatin (LIPITOR) 40 MG tablet, Take 1 tablet (40 mg total) by mouth at bedtime., Disp: 90 tablet, Rfl: 3   clopidogrel (PLAVIX) 75 MG tablet, Take 1 tablet (75 mg total) by mouth daily., Disp: 30 tablet, Rfl: 0   diclofenac Sodium (VOLTAREN) 1 % GEL, Apply 2 g topically to right shoulder 4 (four) times daily, Disp: 350 g, Rfl: 0   empagliflozin (JARDIANCE) 10 MG TABS tablet, Take 1 tablet (10 mg total) by mouth daily., Disp: 90 tablet, Rfl: 3   Fe Asp Gly-Fe Polysac-Suc Ac-C (FERREX 150 PLUS) 150-50-50 MG CAPS, Take 1 capsule by mouth daily., Disp: 90 capsule, Rfl: 1   Fenofibrate 40 MG TABS, Take by mouth., Disp: , Rfl:    FIBER PO, Take 1 tablet by mouth daily as needed (Constipation)., Disp: , Rfl:    fluticasone (FLONASE) 50  MCG/ACT nasal spray, Place 1 spray into both nostrils daily., Disp: , Rfl:    latanoprost (XALATAN) 0.005 % ophthalmic solution, Place 1 drop into the left eye at bedtime., Disp: , Rfl:    leptospermum manuka honey (MEDIHONEY) PSTE paste, Apply 1 Application topically daily. Apply a thin layer to yellow slough on buttocks and cover with dry dressing. Change dressing daily and more frequently if soiled, Disp: 30 mL, Rfl: 0   metoprolol tartrate (LOPRESSOR) 25 MG tablet, Take 1 tablet (25 mg total) by mouth daily., Disp: 90 tablet, Rfl: 3   metoprolol tartrate (LOPRESSOR) 25 MG tablet, Take 1 tablet (25 mg total) by mouth daily., Disp: 90 tablet, Rfl: 3   Multiple Vitamin (MULTIVITAMIN WITH MINERALS) TABS tablet, Take 1 tablet by mouth daily., Disp: 30 tablet, Rfl: 0   nitroGLYCERIN (NITROSTAT) 0.4 MG SL tablet, Place 1 tablet (0.4 mg total) under the tongue every 5 (five) minutes as needed for chest pain., Disp: 25 tablet, Rfl: 3   oxyCODONE (OXY IR/ROXICODONE) 5 MG immediate release tablet, Take 1 tablet (5 mg total) by mouth every 4 (four) hours as needed for pain, Disp: 10 tablet, Rfl: 0   polyethylene glycol powder (GLYCOLAX/MIRALAX) 17 GM/SCOOP powder, Take 17 g by mouth daily., Disp: 238 g, Rfl: 0   sertraline (ZOLOFT) 25 MG tablet, Take 1 tablet (25 mg total) by mouth daily., Disp: 30 tablet, Rfl: 0   sodium chloride (OCEAN) 0.65 % SOLN nasal spray, Place 1 spray into both nostrils 2 (two) times daily before lunch and supper., Disp: 88 mL, Rfl: 0   spironolactone (ALDACTONE) 25 MG tablet, Take 0.5 tablets (12.5 mg total) by mouth daily., Disp: 15 tablet, Rfl: 0   spironolactone (ALDACTONE) 25 MG tablet, Take 0.5 tablets (12.5 mg total) by mouth daily., Disp: 45 tablet, Rfl: 3   triamcinolone cream (KENALOG) 0.1 %, Apply topically to the affected area 2 (two) times daily for 2 to 3 weeks (Patient taking differently: Apply 1 Application topically as needed.), Disp: 454 g, Rfl: 2     Latest Ref  Rng & Units 05/01/2023    1:55 PM  CMP  Glucose 70 - 99 mg/dL 99   BUN 8 - 23 mg/dL 12   Creatinine 1.61 - 1.24 mg/dL 0.96   Sodium 045 -  145 mmol/L 140   Potassium 3.5 - 5.1 mmol/L 4.1   Chloride 98 - 111 mmol/L 102   CO2 22 - 32 mmol/L 27   Calcium 8.9 - 10.3 mg/dL 16.1   Total Protein 6.5 - 8.1 g/dL 7.1   Total Bilirubin 0.0 - 1.2 mg/dL 0.5   Alkaline Phos 38 - 126 U/L 74   AST 15 - 41 U/L 15   ALT 0 - 44 U/L 7       Latest Ref Rng & Units 05/01/2023    1:55 PM  CBC  WBC 4.0 - 10.5 K/uL 12.1   Hemoglobin 13.0 - 17.0 g/dL 09.6   Hematocrit 04.5 - 52.0 % 46.2   Platelets 150 - 400 K/uL 443     No images are attached to the encounter.  No results found.   Assessment and plan-  The patient is a 83 y.o. male who presents to [facility] for initial meeting in preparation for starting chemotherapy for the treatment of No diagnosis found.[facility]/High Risk for ER/Hospitalization during chemotherapy- We discussed the role of the chemo care clinic and identified patient specific risk factors. I discussed that patient was identified as high risk primarily based on:  Patient has past medical history positive for: Past Medical History:  Diagnosis Date   Acute on chronic diastolic CHF (congestive heart failure) (HCC) 01/03/2023   Cancer (HCC)    prostate cancer surgery 2011   Chronic diastolic heart failure (HCC)    Coronary artery disease    sees Dr. Erlene Quan 3 mths ago.  Stress test 03/31/2015 in epic   Degenerative joint disease    Right hip   Depression    Hearing loss of both ears    only at present wears the right ear hearing aid   Hyperlipidemia    Hypertension    Prostate cancer [facility])     Patient has past surgical history positive for: Past Surgical History:  Procedure Laterality Date   CARDIAC CATHETERIZATION  06/17/2009   Proximal LAD 90% stenosis just beyond previously stented segment with a Multi-Link Vision bare-metal stent   CORONARY  ANGIOPLASTY     stents placed in 1998 and 2011    CORONARY PRESSURE/FFR STUDY N/A 05/02/2021   Procedure: INTRAVASCULAR PRESSURE WIRE/FFR STUDY;  Surgeon: Runell Gess, MD;  [facility];  Service: Cardiovascular;  Laterality: N/A;   CORONARY PRESSURE/FFR STUDY N/A 09/19/2021   Procedure: INTRAVASCULAR PRESSURE WIRE/FFR STUDY;  Surgeon: Runell Gess, MD;  [facility];  Service: Cardiovascular;  Laterality: N/A;  RAMUS   CORONARY STENT INTERVENTION N/A 05/02/2021   Procedure: CORONARY STENT INTERVENTION;  Surgeon: Runell Gess, MD;  [facility];  Service: Cardiovascular;  Laterality: N/A;   CORONARY STENT INTERVENTION N/A 09/19/2021   Procedure: CORONARY STENT INTERVENTION;  Surgeon: Runell Gess, MD;  [facility];  Service: Cardiovascular;  Laterality: N/A;   CORONARY STENT INTERVENTION N/A 12/29/2022   Procedure: CORONARY STENT INTERVENTION;  Surgeon: Kathleene Hazel, MD;  [facility];  Service: Cardiovascular;  Laterality: N/A;   LEFT HEART CATH AND CORONARY ANGIOGRAPHY N/A 05/02/2021   Procedure: LEFT HEART CATH AND CORONARY ANGIOGRAPHY;  Surgeon: Runell Gess, MD;  [facility];  Service: Cardiovascular;  Laterality: N/A;   LEFT HEART CATH AND CORONARY ANGIOGRAPHY N/A 09/19/2021   Procedure: LEFT HEART CATH AND CORONARY ANGIOGRAPHY;  Surgeon:  Runell Gess, MD;  [facility];  Service: Cardiovascular;  Laterality: N/A;   LYMPH NODE BIOPSY     NASAL SEPTUM SURGERY     Dr. Richardson Landry @ [facility]  2014   NM MYOVIEW LTD  03/20/2009   RIGHT/LEFT HEART CATH AND CORONARY ANGIOGRAPHY N/A 12/29/2022   Procedure: RIGHT/LEFT HEART CATH AND CORONARY ANGIOGRAPHY;  Surgeon: Kathleene Hazel, MD;  [facility];  Service: Cardiovascular;  Laterality: N/A;   ROBOT ASSISTED LAPAROSCOPIC RADICAL PROSTATECTOMY     TOTAL HIP  ARTHROPLASTY Right 04/19/2015   Procedure: RIGHT TOTAL HIP ARTHROPLASTY ANTERIOR APPROACH;  Surgeon: Jodi Geralds, MD;  [facility] OR;  Service: Orthopedics;  Laterality: Right;    Provided general information including the following:  1.  Date of education: 05/07/2023 2.  Physician name: Dr. Rennis Harding 3.  Diagnosis: Non-Hodgkin lymphoma 4.  Stage: IVA 5.  Cure *** 6.  Chemotherapy plan including drugs and how often: Bendamustine/rituximab every 21 days 7.  Start date: 05/15/2023 8.  Other referrals: None at this time 9.  The patient is to call our office with any questions or concerns.  Our office number [phone], if after hours or on the weekend, call the same number and wait for the answering service.  There is always an oncologist on call. 10.  Medications prescribed: ondansetron, prochlorperazine 11.  The patient has verbalized understanding of the treatment plan and has no barriers to adherence or understanding.   Obtained signed consent from patient.   Discussed symptoms including:  1.  Low blood counts including white blood cells red blood cells, and platelets.  If experience increased fatigue or abnormal bruising or bleeding, call our office. 2.  Infection including to avoid large crowds, wash hands frequently, and stay away from people who were sick.  If fever develops of 100.4 or higher, call our office. 3.  Mucositis:  Instructions on mouth rinse given (baking soda and salt mixture).  Keep mouth clean.  Use soft bristle toothbrush.  Avoid alcohol containing mouthwash.  If mouth sores develop, call our office. 4.  Nausea/vomiting:  Prescriptions given: ondansetron 8 mg every 8 hours as needed for nausea or vomiting and prochlorperazine 10 mg every 6 hours as needed for nausea or vomiting, may alternate these medications and take around the clock if persistent.  If nausea and vomiting is not controlled, call our office 5.  Diarrhea: Use over-the-counter Imodium.  Call our  office if diarrhea is not controlled. 6.  Constipation: Use senna-S, 1 to 2 tablets twice a day.  Call our office if no BM in 2 to 3 days. 7.  Loss of appetite:  Try to eat small meals every 2-3 hours.  Call our office if not able to eat or drink. 8.  Taste changes:  Try zinc 50 mg daily.  If becomes severe call clinic. 9.  Drink 2 to 3 quarts of water per day. Call our office if not able to drink enough for urine to be pale yellow. 10. Avoid alcoholic beverages. 11. Peripheral neuropathy: Call office if numbness or tingling in hands or feet worsens or is suddenly severe. 12.  Ringing in the ears or hearing loss.  Call our office if this develops.    Due to risk of neutropenia, peg-filgrastim (Neulasta) will be given 24 to 48 hours after chemotherapy.  Gave information sheet on bone and joint pain.  Use Claritin or Pepcid.  May use ibuprofen or Aleve.  Call if symptoms persist or are unbearable.   The patient was given written information printed from [employer] patient education on individual chemotherapy agents which includes: Name of medications Approved uses Dose and schedule Storage and handling Handling body fluids and waste Drug and food interactions Possible side effects and management Pregnancy, sexual activity, and contraception Obtaining medication   Gave information on the supportive care team and how to contact them regarding services.  Discussed advanced directives.  The patient does not have their advanced directives.   We discussed that social determinants of health may have significant impacts on health and outcomes for cancer patients.  Today we discussed specific social determinants of performance status, alcohol use, depression, financial needs, food insecurity, housing, interpersonal violence, social connections, stress, tobacco use, and transportation.    After lengthy discussion the following were identified as areas of need:   Outpatient services: We discussed  options including home based and outpatient services, DME, nutrition counseling, and supportive care program. We discussed that patients who participate in regular physical activity report fewer negative impacts of cancer and treatments and report less fatigue.   Financial Concerns: We discussed that living with cancer can create tremendous financial burden.  We discussed options for assistance. I asked that if assistance is needed in affording medications or paying bills to please let us know so that we can provide assistance. We discussed options for food including social services.  Referral to Social work:  Introduced services available, such as support with utility bill, cell phone and gas vouchers.  Introduced [place], [place] who can provide individual counseling.    Support groups: We discussed options for support groups at [facility]. We discussed options for managing stress including healthy eating and exercise, as well as participating in no charge counseling services at the cancer center and support groups.  If these are of interest, patient can notify either myself or primary nursing team.We discussed options for management including medications.  Transportation: We discussed options for transportation.  The patient will contact our office if he requires assistance with transportation.  Palliative care services: We have palliative care services available in the cancer center to discuss goals of care and advanced care planning.  Please let us know if you have any questions or would like to speak to our palliative care practitioner.  Symptom Management Clinic: We discussed our symptom management clinic which is available for acute concerns while receiving treatment such as nausea, vomiting or diarrhea.  We can be reached via telephone at [phone].  We are available for virtual or in person visits on the same day from 9 to 4 PM Monday through Friday.   He denies needing specific  assistance at this time.  He will be followed by Dr. Theadore Nan clinical team.   Disposition: RTC on 3825/2025  Visit Diagnosis No diagnosis found.  I discussed the assessment and treatment plan with the patient.  The patient was provided an opportunity to ask questions and all were answered. The patient expressed understanding and was in agreement with this plan. He also understands that he can call clinic at any time with any questions, concerns, or complaints.   I provided *** minutes of {Blank single:19197::"face-to-face time","face-to-face video visit time","non face-to-face telephone visit time"} during this encounter, and > 50% was spent counseling as documented under my assessment & plan.   Paije Goodhart A. Sol Passer, PA-C [facility] [phone]

## 2023-05-08 ENCOUNTER — Other Ambulatory Visit (HOSPITAL_BASED_OUTPATIENT_CLINIC_OR_DEPARTMENT_OTHER): Payer: Self-pay

## 2023-05-08 ENCOUNTER — Telehealth: Payer: Self-pay

## 2023-05-08 LAB — HEPATITIS B CORE ANTIBODY, TOTAL: HEP B CORE AB: NEGATIVE

## 2023-05-08 MED ORDER — OXYCODONE HCL 5 MG PO TABS
5.0000 mg | ORAL_TABLET | ORAL | 0 refills | Status: DC | PRN
  Filled 2023-05-08: qty 10, 2d supply, fill #0

## 2023-05-08 NOTE — Telephone Encounter (Signed)
 Spoke with patients wife Jeffrey Acevedo notified ^ white count. They voiced he was flushed for a short period of time yesterday , but it went away very fast.No other symptoms they are aware of. Will call with any concerns.

## 2023-05-08 NOTE — Telephone Encounter (Signed)
-----   Message from Adah Perl sent at 05/08/2023  2:23 PM EST ----- Please let his wife and/or daughter know his white count is elevated. Any sign of infection? Sore throat, cough, urinary difficulties, diarrhea? Thanks

## 2023-05-09 ENCOUNTER — Encounter: Payer: Self-pay | Admitting: Hematology and Oncology

## 2023-05-09 ENCOUNTER — Other Ambulatory Visit (HOSPITAL_BASED_OUTPATIENT_CLINIC_OR_DEPARTMENT_OTHER): Payer: Self-pay

## 2023-05-09 ENCOUNTER — Other Ambulatory Visit (HOSPITAL_COMMUNITY): Payer: Self-pay

## 2023-05-09 ENCOUNTER — Encounter: Payer: Self-pay | Admitting: Oncology

## 2023-05-09 ENCOUNTER — Other Ambulatory Visit: Payer: Self-pay | Admitting: Hematology and Oncology

## 2023-05-09 DIAGNOSIS — C8599 Non-Hodgkin lymphoma, unspecified, extranodal and solid organ sites: Secondary | ICD-10-CM

## 2023-05-09 DIAGNOSIS — E876 Hypokalemia: Secondary | ICD-10-CM

## 2023-05-09 MED ORDER — ONDANSETRON HCL 8 MG PO TABS
8.0000 mg | ORAL_TABLET | Freq: Three times a day (TID) | ORAL | 1 refills | Status: DC | PRN
  Filled 2023-05-09: qty 30, 10d supply, fill #0

## 2023-05-09 MED ORDER — PROCHLORPERAZINE MALEATE 10 MG PO TABS
10.0000 mg | ORAL_TABLET | Freq: Four times a day (QID) | ORAL | 1 refills | Status: DC | PRN
  Filled 2023-05-09: qty 30, 8d supply, fill #0

## 2023-05-14 ENCOUNTER — Other Ambulatory Visit (HOSPITAL_BASED_OUTPATIENT_CLINIC_OR_DEPARTMENT_OTHER): Payer: Self-pay

## 2023-05-15 ENCOUNTER — Inpatient Hospital Stay: Payer: Medicare PPO

## 2023-05-15 ENCOUNTER — Other Ambulatory Visit: Payer: Self-pay | Admitting: Pharmacist

## 2023-05-15 VITALS — BP 147/82 | HR 63 | Temp 98.0°F | Resp 18 | Ht 72.0 in | Wt 180.0 lb

## 2023-05-15 DIAGNOSIS — Z5112 Encounter for antineoplastic immunotherapy: Secondary | ICD-10-CM | POA: Diagnosis not present

## 2023-05-15 DIAGNOSIS — C8599 Non-Hodgkin lymphoma, unspecified, extranodal and solid organ sites: Secondary | ICD-10-CM

## 2023-05-15 MED ORDER — PALONOSETRON HCL INJECTION 0.25 MG/5ML
0.2500 mg | Freq: Once | INTRAVENOUS | Status: AC
  Administered 2023-05-15: 0.25 mg via INTRAVENOUS
  Filled 2023-05-15: qty 5

## 2023-05-15 MED ORDER — SODIUM CHLORIDE 0.9 % IV SOLN
88.5000 mg/m2 | Freq: Once | INTRAVENOUS | Status: AC
  Administered 2023-05-15: 180 mg via INTRAVENOUS
  Filled 2023-05-15: qty 7.2

## 2023-05-15 MED ORDER — ACETAMINOPHEN 325 MG PO TABS
650.0000 mg | ORAL_TABLET | Freq: Once | ORAL | Status: AC
  Administered 2023-05-15: 650 mg via ORAL
  Filled 2023-05-15: qty 2

## 2023-05-15 MED ORDER — DIPHENHYDRAMINE HCL 25 MG PO CAPS
50.0000 mg | ORAL_CAPSULE | Freq: Once | ORAL | Status: AC
  Administered 2023-05-15: 50 mg via ORAL
  Filled 2023-05-15: qty 2

## 2023-05-15 MED ORDER — HEPARIN SOD (PORK) LOCK FLUSH 100 UNIT/ML IV SOLN: 500.0000 [IU] | Freq: Once | INTRAVENOUS | Status: DC | PRN

## 2023-05-15 MED ORDER — SODIUM CHLORIDE 0.9 % IV SOLN: INTRAVENOUS | Status: DC

## 2023-05-15 MED ORDER — SODIUM CHLORIDE 0.9% FLUSH: 10.0000 mL | INTRAVENOUS | Status: DC | PRN

## 2023-05-15 MED ORDER — DEXAMETHASONE SODIUM PHOSPHATE 10 MG/ML IJ SOLN
10.0000 mg | Freq: Once | INTRAMUSCULAR | Status: AC
  Administered 2023-05-15: 10 mg via INTRAVENOUS
  Filled 2023-05-15: qty 1

## 2023-05-15 MED ORDER — SODIUM CHLORIDE 0.9 % IV SOLN
375.0000 mg/m2 | Freq: Once | INTRAVENOUS | Status: AC
  Administered 2023-05-15: 800 mg via INTRAVENOUS
  Filled 2023-05-15: qty 50

## 2023-05-15 NOTE — Progress Notes (Addendum)
..  Pharmacist Chemotherapy Monitoring - Initial Assessment    Anticipated start date: 05/15/23  Provider doesn't want allopurinol due to low volume skin disease only.  Also doesn't want acyclovir.  The following has been reviewed per standard work regarding the patient's treatment regimen: The patient's diagnosis, treatment plan and drug doses, and organ/hematologic function Lab orders and baseline tests specific to treatment regimen  The treatment plan start date, drug sequencing, and pre-medications Prior authorization status  Patient's documented medication list, including drug-drug interaction screen and prescriptions for anti-emetics and supportive care specific to the treatment regimen The drug concentrations, fluid compatibility, administration routes, and timing of the medications to be used The patient's access for treatment and lifetime cumulative dose history, if applicable  The patient's medication allergies and previous infusion related reactions, if applicable   Changes made to treatment plan:  N/A  Follow up needed:  N/A   Domenic Schwab, [facility], 05/15/2023  9:41 AM

## 2023-05-16 ENCOUNTER — Inpatient Hospital Stay: Payer: Medicare PPO

## 2023-05-16 ENCOUNTER — Encounter: Payer: Self-pay | Admitting: Oncology

## 2023-05-16 VITALS — BP 127/60 | HR 49 | Temp 98.7°F | Resp 18 | Ht 72.0 in | Wt 176.0 lb

## 2023-05-16 DIAGNOSIS — C8599 Non-Hodgkin lymphoma, unspecified, extranodal and solid organ sites: Secondary | ICD-10-CM

## 2023-05-16 DIAGNOSIS — Z5112 Encounter for antineoplastic immunotherapy: Secondary | ICD-10-CM | POA: Diagnosis not present

## 2023-05-16 MED ORDER — DEXAMETHASONE SODIUM PHOSPHATE 10 MG/ML IJ SOLN
10.0000 mg | Freq: Once | INTRAMUSCULAR | Status: AC
  Administered 2023-05-16: 10 mg via INTRAVENOUS
  Filled 2023-05-16: qty 1

## 2023-05-16 MED ORDER — SODIUM CHLORIDE 0.9% FLUSH: 10.0000 mL | INTRAVENOUS | Status: DC | PRN

## 2023-05-16 MED ORDER — SODIUM CHLORIDE 0.9 % IV SOLN
88.5000 mg/m2 | Freq: Once | INTRAVENOUS | Status: AC
  Administered 2023-05-16: 180 mg via INTRAVENOUS
  Filled 2023-05-16: qty 7.2

## 2023-05-16 MED ORDER — HEPARIN SOD (PORK) LOCK FLUSH 100 UNIT/ML IV SOLN: 500.0000 [IU] | Freq: Once | INTRAVENOUS | Status: DC | PRN

## 2023-05-16 MED ORDER — SODIUM CHLORIDE 0.9 % IV SOLN: INTRAVENOUS | Status: DC

## 2023-05-16 NOTE — Patient Instructions (Signed)
Bendamustine Injection What is this medication? BENDAMUSTINE (BEN da MUS teen) treats leukemia and lymphoma. It works by slowing down the growth of cancer cells. This medicine may be used for other purposes; ask your health care provider or pharmacist if you have questions. COMMON BRAND NAME(S): Marilynne Halsted, VIVIMUSTA What should I tell my care team before I take this medication? They need to know if you have any of these conditions: Infection, especially a viral infection, such as chickenpox, cold sores, herpes Kidney disease Liver disease An unusual or allergic reaction to bendamustine, mannitol, other medications, foods, dyes, or preservatives Pregnant or trying to get pregnant Breast-feeding How should I use this medication? This medication is injected into a vein. It is given by your care team in a hospital or clinic setting. Talk to your care team about the use of this medication in children. Special care may be needed. Overdosage: If you think you have taken too much of this medicine contact a poison control center or emergency room at once. NOTE: This medicine is only for you. Do not share this medicine with others. What if I miss a dose? Keep appointments for follow-up doses. It is important not to miss your dose. Call your care team if you are unable to keep an appointment. What may interact with this medication? Do not take this medication with any of the following: Clozapine This medication may also interact with the following: Atazanavir Cimetidine Ciprofloxacin Enoxacin Fluvoxamine Medications for seizures, such as carbamazepine, phenobarbital Mexiletine Rifampin Tacrine Thiabendazole Zileuton This list may not describe all possible interactions. Give your health care provider a list of all the medicines, herbs, non-prescription drugs, or dietary supplements you use. Also tell them if you smoke, drink alcohol, or use illegal drugs. Some items may  interact with your medicine. What should I watch for while using this medication? Visit your care team for regular checks on your progress. This medication may make you feel generally unwell. This is not uncommon, as chemotherapy can affect healthy cells as well as cancer cells. Report any side effects. Continue your course of treatment even though you feel ill unless your care team tells you to stop. You may need blood work while taking this medication. This medication may increase your risk of getting an infection. Call your care team for advice if you get a fever, chills, sore throat, or other symptoms of a cold or flu. Do not treat yourself. Try to avoid being around people who are sick. This medication may cause serious skin reactions. They can happen weeks to months after starting the medication. Contact your care team right away if you notice fevers or flu-like symptoms with a rash. The rash may be red or purple and then turn into blisters or peeling of the skin. You may also notice a red rash with swelling of the face, lips, or lymph nodes in your neck or under your arms. In some patients, this medication may cause a serious brain infection that may cause death. If you have any problems seeing, thinking, speaking, walking, or standing, tell your care team right away. If you cannot reach your care team, urgently seek other source of medical care. This medication may increase your risk to bruise or bleed. Call your care team if you notice any unusual bleeding. Talk to your care team about your risk of cancer. You may be more at risk for certain types of cancer if you take this medication. Talk to your care team about your  risk of skin cancer. You may be more at risk for skin cancer if you take this medication. Talk to your care team if you or your partner wish to become pregnant or think either of you might be pregnant. This medication can cause serious birth defects if taken during pregnancy or for  up to 6 months after the last dose. A negative pregnancy test is required before starting this medication. A reliable form of contraception is recommended while taking this medication and for 6 months after the last dose. Talk to your care team about reliable forms of contraception. Wear a condom while taking this medication and for at least 3 months after the last dose. Do not breast-feed while taking this medication or for at least 1 week after the last dose. This medication may cause infertility. Talk to your care team if you are concerned about your fertility. What side effects may I notice from receiving this medication? Side effects that you should report to your care team as soon as possible: Allergic reactions--skin rash, itching, hives, swelling of the face, lips, tongue, or throat Infection--fever, chills, cough, sore throat, wounds that don't heal, pain or trouble when passing urine, general feeling of discomfort or being unwell Infusion reactions--chest pain, shortness of breath or trouble breathing, feeling faint or lightheaded Liver injury--right upper belly pain, loss of appetite, nausea, light-colored stool, dark yellow or brown urine, yellowing skin or eyes, unusual weakness or fatigue Low red blood cell level--unusual weakness or fatigue, dizziness, headache, trouble breathing Painful swelling, warmth, or redness of the skin, blisters or sores at the infusion site Rash, fever, and swollen lymph nodes Redness, blistering, peeling, or loosening of the skin, including inside the mouth Tumor lysis syndrome (TLS)--nausea, vomiting, diarrhea, decrease in the amount of urine, dark urine, unusual weakness or fatigue, confusion, muscle pain or cramps, fast or irregular heartbeat, joint pain Unusual bruising or bleeding Side effects that usually do not require medical attention (report to your care team if they continue or are bothersome): Diarrhea Fatigue Headache Loss of  appetite Nausea Vomiting This list may not describe all possible side effects. Call your doctor for medical advice about side effects. You may report side effects to FDA at 1-800-FDA-1088. Where should I keep my medication? This medication is given in a hospital or clinic. It will not be stored at home. NOTE: This sheet is a summary. It may not cover all possible information. If you have questions about this medicine, talk to your doctor, pharmacist, or health care provider.  2024 Elsevier/Gold Standard (2021-06-28 00:00:00)

## 2023-05-17 ENCOUNTER — Telehealth: Payer: Self-pay

## 2023-05-17 NOTE — Telephone Encounter (Signed)
 I spoke with pt's wife. She said that he has done pretty good & hasn't had any reaction to the medicines. I reminded her to call us if he were to develop temp of 100.4 or higher, day or night. She verbalized understanding. I did ask if he had any fever, chills, cough, skin rash, itching, N/V, constipation,and diarrhea. She replied, "No". I told her he may eventually experience mouth sores, headache, joint pain and edema in lower extremities. If so, she will notify us. We confirmed his next appt for 06/12/2023 @ 1030a.

## 2023-05-25 ENCOUNTER — Other Ambulatory Visit (HOSPITAL_BASED_OUTPATIENT_CLINIC_OR_DEPARTMENT_OTHER): Payer: Self-pay

## 2023-05-29 ENCOUNTER — Other Ambulatory Visit (HOSPITAL_BASED_OUTPATIENT_CLINIC_OR_DEPARTMENT_OTHER): Payer: Self-pay

## 2023-05-29 MED ORDER — SERTRALINE HCL 25 MG PO TABS
25.0000 mg | ORAL_TABLET | Freq: Every day | ORAL | 0 refills | Status: DC
  Filled 2023-05-29: qty 90, 90d supply, fill #0

## 2023-06-04 ENCOUNTER — Telehealth: Payer: Self-pay | Admitting: Neurology

## 2023-06-04 ENCOUNTER — Ambulatory Visit: Payer: Self-pay | Admitting: Neurology

## 2023-06-04 ENCOUNTER — Encounter: Payer: Self-pay | Admitting: Neurology

## 2023-06-04 VITALS — BP 125/75 | HR 54 | Ht 72.0 in

## 2023-06-04 DIAGNOSIS — I69351 Hemiplegia and hemiparesis following cerebral infarction affecting right dominant side: Secondary | ICD-10-CM | POA: Diagnosis not present

## 2023-06-04 DIAGNOSIS — I639 Cerebral infarction, unspecified: Secondary | ICD-10-CM | POA: Diagnosis not present

## 2023-06-04 DIAGNOSIS — H9193 Unspecified hearing loss, bilateral: Secondary | ICD-10-CM

## 2023-06-04 NOTE — Patient Instructions (Signed)
 I had a long d/w patient , his wife and daughter about his recent cryptogenic strokes, recent diagnosis of lymphoma, risk for recurrent stroke/TIAs, personally independently reviewed imaging studies and stroke evaluation results and answered questions.Continue aspirin 81 mg daily and clopidogrel 75 mg daily  for secondary stroke prevention given his significant coronary artery disease and recent coronary stents and maintain strict control of hypertension with blood pressure goal below 130/90, diabetes with hemoglobin A1c goal below 6.5% and lipids with LDL cholesterol goal below 70 mg/dL. I also advised the patient to eat a healthy diet with plenty of whole grains, cereals, fruits and vegetables, exercise regularly and maintain ideal body weight .Refer to cardiac electrophysiology for loop recorder implantation for paroxysmal A-fib.  Continue ongoing home physical and Occupational Therapy.  Trying new hearing aid and perhaps it will improve his communication and participation with therapies.  Followup in the future with my nurse practitioner in 4 months or call earlier if necessary.  Stroke Prevention Some medical conditions and behaviors can lead to a higher chance of having a stroke. You can help prevent a stroke by eating healthy, exercising, not smoking, and managing any medical conditions you have. Stroke is a leading cause of functional impairment. Primary prevention is particularly important because a majority of strokes are first-time events. Stroke changes the lives of not only those who experience a stroke but also their family and other caregivers. How can this condition affect me? A stroke is a medical emergency and should be treated right away. A stroke can lead to brain damage and can sometimes be life-threatening. If a person gets medical treatment right away, there is a better chance of surviving and recovering from a stroke. What can increase my risk? The following medical conditions may  increase your risk of a stroke: Cardiovascular disease. High blood pressure (hypertension). Diabetes. High cholesterol. Sickle cell disease. Blood clotting disorders (hypercoagulable state). Obesity. Sleep disorders (obstructive sleep apnea). Other risk factors include: Being older than age 59. Having a history of blood clots, stroke, or mini-stroke (transient ischemic attack, TIA). Genetic factors, such as race, ethnicity, or a family history of stroke. Smoking cigarettes or using other tobacco products. Taking birth control pills, especially if you also use tobacco. Heavy use of alcohol or drugs, especially cocaine and methamphetamine. Physical inactivity. What actions can I take to prevent this? Manage your health conditions High cholesterol levels. Eating a healthy diet is important for preventing high cholesterol. If cholesterol cannot be managed through diet alone, you may need to take medicines. Take any prescribed medicines to control your cholesterol as told by your health care provider. Hypertension. To reduce your risk of stroke, try to keep your blood pressure below 130/80. Eating a healthy diet and exercising regularly are important for controlling blood pressure. If these steps are not enough to manage your blood pressure, you may need to take medicines. Take any prescribed medicines to control hypertension as told by your health care provider. Ask your health care provider if you should monitor your blood pressure at home. Have your blood pressure checked every year, even if your blood pressure is normal. Blood pressure increases with age and some medical conditions. Diabetes. Eating a healthy diet and exercising regularly are important parts of managing your blood sugar (glucose). If your blood sugar cannot be managed through diet and exercise, you may need to take medicines. Take any prescribed medicines to control your diabetes as told by your health care  provider. Get evaluated for obstructive  sleep apnea. Talk to your health care provider about getting a sleep evaluation if you snore a lot or have excessive sleepiness. Make sure that any other medical conditions you have, such as atrial fibrillation or atherosclerosis, are managed. Nutrition Follow instructions from your health care provider about what to eat or drink to help manage your health condition. These instructions may include: Reducing your daily calorie intake. Limiting how much salt (sodium) you use to 1,500 milligrams (mg) each day. Using only healthy fats for cooking, such as olive oil, canola oil, or sunflower oil. Eating healthy foods. You can do this by: Choosing foods that are high in fiber, such as whole grains, and fresh fruits and vegetables. Eating at least 5 servings of fruits and vegetables a day. Try to fill one-half of your plate with fruits and vegetables at each meal. Choosing lean protein foods, such as lean cuts of meat, poultry without skin, fish, tofu, beans, and nuts. Eating low-fat dairy products. Avoiding foods that are high in sodium. This can help lower blood pressure. Avoiding foods that have saturated fat, trans fat, and cholesterol. This can help prevent high cholesterol. Avoiding processed and prepared foods. Counting your daily carbohydrate intake.  Lifestyle If you drink alcohol: Limit how much you have to: 0-1 drink a day for women who are not pregnant. 0-2 drinks a day for men. Know how much alcohol is in your drink. In the [place] one drink equals one 12 oz bottle of beer ( ), one 5 oz glass of wine ( ), or one 1 oz glass of hard liquor (44mL). Do not use any products that contain nicotine or tobacco. These products include cigarettes, chewing tobacco, and vaping devices, such as e-cigarettes. If you need help quitting, ask your health care provider. Avoid secondhand smoke. Do not use drugs. Activity  Try to stay at a healthy  weight. Get at least 30 minutes of exercise on most days, such as: Fast walking. Biking. Swimming. Medicines Take over-the-counter and prescription medicines only as told by your health care provider. Aspirin or blood thinners (antiplatelets or anticoagulants) may be recommended to reduce your risk of forming blood clots that can lead to stroke. Avoid taking birth control pills. Talk to your health care provider about the risks of taking birth control pills if: You are over 68 years old. You smoke. You get very bad headaches. You have had a blood clot. Where to find more information American Stroke Association: www.strokeassociation.org Get help right away if: You or a loved one has any symptoms of a stroke. "BE FAST" is an easy way to remember the main warning signs of a stroke: B - Balance. Signs are dizziness, sudden trouble walking, or loss of balance. E - Eyes. Signs are trouble seeing or a sudden change in vision. F - Face. Signs are sudden weakness or numbness of the face, or the face or eyelid drooping on one side. A - Arms. Signs are weakness or numbness in an arm. This happens suddenly and usually on one side of the body. S - Speech. Signs are sudden trouble speaking, slurred speech, or trouble understanding what people say. T - Time. Time to call emergency services. Write down what time symptoms started. You or a loved one has other signs of a stroke, such as: A sudden, severe headache with no known cause. Nausea or vomiting. Seizure. These symptoms may represent a serious problem that is an emergency. Do not wait to see if the symptoms will go away. Get medical  help right away. Call your local emergency services (911 in the [place] Do not drive yourself to the hospital. Summary You can help to prevent a stroke by eating healthy, exercising, not smoking, limiting alcohol intake, and managing any medical conditions you may have. Do not use any products that contain nicotine or  tobacco. These include cigarettes, chewing tobacco, and vaping devices, such as e-cigarettes. If you need help quitting, ask your health care provider. Remember "BE FAST" for warning signs of a stroke. Get help right away if you or a loved one has any of these signs. This information is not intended to replace advice given to you by your health care provider. Make sure you discuss any questions you have with your health care provider. Document Revised: 02/06/2022 Document Reviewed: 02/06/2022 Elsevier Patient Education  2024 [employer].

## 2023-06-04 NOTE — Progress Notes (Signed)
 [facility] [address] [place]. [place] [address] [phone]       OFFICE CONSULT NOTE  Jeffrey Acevedo Date of Birth:  1940/05/05 Medical Record Number:  [id number]   Referring MD:  Delle Reining  Reason for Referral:  stroke  HPI: Jeffrey Acevedo is a 83 year old Caucasian male seen today for initial office consultation visit for stroke.  He is accompanied by his wife and daughter today.  History is obtained from them and review of electronic medical records.  I personally reviewed pertinent available imaging films in PACS.  He has past medical history significant for hypertension, hyperlipidemia, coronary artery disease, heart failure, prostate cancer and depression.  He was admitted to [facility] in November 2020 for with sudden onset of right hemiparesis.  I do not have actual imaging films to review but as per report presented on 02/11/2023 to [facility] with right-sided weakness over the preceding 1 to 2 days.  CT head was negative for acute abnormality and CT angiogram head and neck showed no large vessel stenosis or high-grade stenosis.  2D echo showed normal ejection fraction without cardiac source of embolism.  LDL cholesterol is 80 mg percent hemoglobin A1c was 4.8.  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  Patient was started on dual antiplatelet therapy and recommended loop recorder to look for paroxysmal A-fib.  Patient was transferred to inpatient rehab to [facility] where he stayed from 02/22/2023 to 03/09/2023.  Patient is currently now living with his daughter.  He is getting home physical occupational therapy.  Stable to stand with two-person assist to hold up.  He still not able to bear weight.  He is able to move his right side of the bed but is not able to walk.  He is able to use his right hand to feed himself.  He does have significant bilateral decreased hearing  which seems to affect his ability to understand and communicate.  Can follow only one-step commands and gestures and follow visual cues.  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  He does have history of baseline mild cognitive impairment versus early dementia and family feels they have noticed cognitive worsening since the strokes.  He is tolerating aspirin and Plavix well with minor bruising and no bleeding.  He does have significant coronary artery disease and last cardiac stent was in September 2024.  He does follow-up with oncologist Dr. Melvyn Neth for his lymphoma treatment.  ROS:   14 system review of systems is positive for decreased hearing, bruising, weakness, poor balance, difficulty walking, memory loss all other systems negative  PMH:  Past Medical History:  Diagnosis Date   Acute on chronic diastolic CHF (congestive heart failure) (HCC) 01/03/2023   Cancer (HCC)    prostate cancer surgery 2011   Chronic diastolic heart failure (HCC)    Coronary artery disease    sees Dr. Erlene Quan 3 mths ago.  Stress test 03/31/2015 in epic   Degenerative joint disease    Right hip   Depression    Hearing loss of both ears    only at present wears the right ear hearing aid   Hyperlipidemia    Hypertension    Prostate cancer [facility])     Social History:  Social History   Socioeconomic History   Marital status: Married  Spouse name: KATHY   Number of children: 2   Years of education: 12 + 4   Highest education level: Not on file  Occupational History   Occupation: RETIRED MIDDLE SCHOOL TEACHER / COACH  Tobacco Use   Smoking status: Never   Smokeless tobacco: Never  Vaping Use   Vaping status: Never Used  Substance and Sexual Activity   Alcohol use: No   Drug use: No   Sexual activity: Not Currently  Other Topics Concern   Not on file  Social History Narrative   Not on file   Social Drivers of  Health   Financial Resource Strain: Low Risk  (01/15/2023)   Overall Financial Resource Strain (CARDIA)    Difficulty of Paying Living Expenses: Not very hard  Food Insecurity: No Food Insecurity (03/12/2023)   Hunger Vital Sign    Worried About Running Out of Food in the Last Year: Never true    Ran Out of Food in the Last Year: Never true  Transportation Needs: No Transportation Needs (03/12/2023)   PRAPARE - Administrator, Civil Service (Medical): No    Lack of Transportation (Non-Medical): No  Recent Concern: Transportation Needs - Unmet Transportation Needs (12/12/2022)   Received from [facility]   Transportation    In the past 12 months, has lack of reliable transportation kept you from medical appointments, meetings, work or from getting things needed for daily living? : Yes  Physical Activity: Not on file  Stress: Not on file  Social Connections: Not on file  Intimate Partner Violence: Not At Risk (03/12/2023)   Humiliation, Afraid, Rape, and Kick questionnaire    Fear of Current or Ex-Partner: No    Emotionally Abused: No    Physically Abused: No    Sexually Abused: No    Medications:   Current Outpatient Medications on File Prior to Visit  Medication Sig Dispense Refill   aspirin EC 81 MG tablet Take 1 tablet (81 mg total) by mouth daily. Swallow whole. 90 tablet 0   atorvastatin (LIPITOR) 40 MG tablet Take 1 tablet (40 mg total) by mouth at bedtime. (Patient taking differently: Take 40 mg by mouth at bedtime. Taking 1/2 tablet in am or pm) 90 tablet 3   clopidogrel (PLAVIX) 75 MG tablet Take 1 tablet (75 mg total) by mouth daily. 30 tablet 0   diclofenac Sodium (VOLTAREN) 1 % GEL Apply 2 g topically to right shoulder 4 (four) times daily 350 g 0   empagliflozin (JARDIANCE) 10 MG TABS tablet Take 1 tablet (10 mg total) by mouth daily. 90 tablet 3   Fe Asp Gly-Fe Polysac-Suc Ac-C (FERREX 150 PLUS) 150-50-50 MG CAPS Take 1 capsule by mouth daily. 90 capsule 1    Fenofibrate 40 MG TABS Take by mouth.     FIBER PO Take 1 tablet by mouth daily as needed (Constipation).     fluticasone (FLONASE) 50 MCG/ACT nasal spray Place 1 spray into both nostrils daily.     latanoprost (XALATAN) 0.005 % ophthalmic solution Place 1 drop into the left eye at bedtime.     leptospermum manuka honey (MEDIHONEY) PSTE paste Apply 1 Application topically daily. Apply a thin layer to yellow slough on buttocks and cover with dry dressing. Change dressing daily and more frequently if soiled 30 mL 0   metoprolol tartrate (LOPRESSOR) 25 MG tablet Take 1 tablet (25 mg total) by mouth daily. 90 tablet 3   metoprolol tartrate (LOPRESSOR) 25 MG tablet Take 1  tablet (25 mg total) by mouth daily. 90 tablet 3   Multiple Vitamin (MULTIVITAMIN WITH MINERALS) TABS tablet Take 1 tablet by mouth daily. 30 tablet 0   nitroGLYCERIN (NITROSTAT) 0.4 MG SL tablet Place 1 tablet (0.4 mg total) under the tongue every 5 (five) minutes as needed for chest pain. 25 tablet 3   ondansetron (ZOFRAN) 8 MG tablet Take 1 tablet (8 mg total) by mouth every 8 (eight) hours as needed for nausea or vomiting. Start on the third day after chemotherapy. 30 tablet 1   prochlorperazine (COMPAZINE) 10 MG tablet Take 1 tablet (10 mg total) by mouth every 6 (six) hours as needed for nausea or vomiting. 30 tablet 1   sertraline (ZOLOFT) 25 MG tablet Take 1 tablet (25 mg total) by mouth daily. 90 tablet 0   sodium chloride (OCEAN) 0.65 % SOLN nasal spray Place 1 spray into both nostrils 2 (two) times daily before lunch and supper. 88 mL 0   spironolactone (ALDACTONE) 25 MG tablet Take 0.5 tablets (12.5 mg total) by mouth daily. 45 tablet 3   triamcinolone cream (KENALOG) 0.1 % Apply topically to the affected area 2 (two) times daily for 2 to 3 weeks (Patient taking differently: Apply 1 Application topically as needed.) 454 g 2   oxyCODONE (OXY IR/ROXICODONE) 5 MG immediate release tablet Take 1 tablet (5 mg total) by mouth  every 4 (four) hours as needed for pain (Patient not taking: Reported on 06/04/2023) 10 tablet 0   oxyCODONE (OXY IR/ROXICODONE) 5 MG immediate release tablet Take 1 tablet (5 mg total) by mouth every 4 (four) hours as needed for pain. (Patient not taking: Reported on 06/04/2023) 10 tablet 0   polyethylene glycol powder (GLYCOLAX/MIRALAX) 17 GM/SCOOP powder Take 17 g by mouth daily. (Patient not taking: Reported on 06/04/2023) 238 g 0   spironolactone (ALDACTONE) 25 MG tablet Take 0.5 tablets (12.5 mg total) by mouth daily. 15 tablet 0   [DISCONTINUED] iron polysaccharides (NIFEREX) 150 MG capsule Take 1 capsule (150 mg total) by mouth daily with lunch. 30 capsule 0   No current facility-administered medications on file prior to visit.    Allergies:   Allergies  Allergen Reactions   Adhesive [Tape] Other (See Comments)    Mainly just redness.   Has been ruled out for latex allegery   Hydrochlorothiazide Other (See Comments)    H/o Pancreatitis, takes in Avalide at home   Latex Other (See Comments)    unknown   Vantin [Cefpodoxime] Rash    Physical Exam General: well developed, well nourished, seated, in no evident distress Head: head normocephalic and atraumatic.   Neck: supple with no carotid or supraclavicular bruits Cardiovascular: regular rate and rhythm, no murmurs Musculoskeletal: no deformity Skin:  no rash/petichiae Vascular:  Normal pulses all extremities  Neurologic Exam Mental Status: Awake and fully alert. Oriented to place and time. Recent and remote memory poor attention span, concentration and fund of knowledge diminished.  Significant impaired cognitive testing impairs. Mood and affect appropriate.  Cranial Nerves: Fundoscopic exam reveals sharp disc margins. Pupils equal, briskly reactive to light. Extraocular movements full without nystagmus. Visual fields full to confrontation. Hearing intact. Facial sensation intact. Face, tongue, palate moves normally and  symmetrically.  Motor: Spastic right hemiparesis .  Right upper extremity 3/5 strength with weakness of right grip intrinsic hand muscles.  Right lower extremity 4/5 strength proximally and right ankle dorsiflexor weakness.  Toes with increased on the right with mild spasticity.  Normal strength  on the left.. Sensory.:  Diminished right hemibody sensation to touch , pinprick , position and vibratory sensation.  Coordination: Slightly impaired on the right proportionate to weakness normal on the left.   Gait and Station: Deferred as patient is a two-person assist even to stand Reflexes: 2+ and asymmetric brisker on the right. Toes downgoing.   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   Neurological exam limited by his baseline cognitive impairment and severe decreased hearing bilaterally.  Vascular risk factors of hypertension, hyperlipidemia, diabetes, coronary artery disease, congestive heart failure.     PLAN:I had a long d/w patient , his wife and daughter about his recent cryptogenic strokes, recent diagnosis of lymphoma, risk for recurrent stroke/TIAs, personally independently reviewed imaging studies and stroke evaluation results and answered questions.Continue aspirin 81 mg daily and clopidogrel 75 mg daily  for secondary stroke prevention given his significant coronary artery disease and recent coronary stents and maintain strict control of hypertension with blood pressure goal below 130/90, diabetes with hemoglobin A1c goal below 6.5% and lipids with LDL cholesterol goal below 70 mg/dL. I also advised the patient to eat a healthy diet with plenty of whole grains, cereals, fruits and vegetables, exercise regularly and maintain ideal body weight .Refer to cardiac electrophysiology for loop recorder implantation for paroxysmal A-fib.  Continue ongoing home physical and Occupational Therapy.   Trying new hearing aid and perhaps it will improve his communication and participation with therapies.  Followup in the future with my nurse practitioner in 4 months or call earlier if necessary. Greater than 50% time during this 45-minute consultation visit was spent on counseling and coordination of care about his embolic stroke and discussion about his hemiparesis and baseline cognitive impairment and plan for evaluation and treatment and answering questions Delia Heady, MD Note: This document was prepared with digital dictation and possible smart phrase technology. Any transcriptional errors that result from this process are unintentional.

## 2023-06-05 ENCOUNTER — Encounter: Payer: Self-pay | Admitting: Oncology

## 2023-06-06 ENCOUNTER — Other Ambulatory Visit: Payer: Self-pay

## 2023-06-07 ENCOUNTER — Encounter: Payer: Self-pay | Admitting: Oncology

## 2023-06-08 ENCOUNTER — Ambulatory Visit: Payer: Medicare PPO | Attending: Oncology | Admitting: Audiologist

## 2023-06-08 DIAGNOSIS — I639 Cerebral infarction, unspecified: Secondary | ICD-10-CM | POA: Insufficient documentation

## 2023-06-08 DIAGNOSIS — H903 Sensorineural hearing loss, bilateral: Secondary | ICD-10-CM | POA: Insufficient documentation

## 2023-06-08 NOTE — Procedures (Signed)
 Outpatient Audiology and [facility] [address] [place], [place]  [address] [phone]  AUDIOLOGICAL  EVALUATION  NAME: Jeffrey Acevedo     DOB:   [birth year]      MRN: [id number]                                                                                     DATE: 06/08/2023     REFERENT: [name], MD STATUS: Outpatient DIAGNOSIS: Stroke, Sensorineural Hearing Loss   History: Brigham was seen for an audiological evaluation due to increased difficulty hearing after several TIA strokes. Teren has lost the function in the left side of his body. He also is receiving treatment for Non-Hodgkin lymphoma. He is having lots of difficulty hearing the TV. His [employer] hearing aids are not working for him anymore. He also has limited vision since the stoke.  Raden denies pain, pressure, or tinnitus today.  Lucas has history of hazardous noise exposure from years of firearm use while hunting.   Evaluation:  Otoscopy showed a slight view of the tympanic membranes with collapsing canals and cerumen present, bilaterally Tympanometry results were consistent with normal middle ear function, bilaterally   Audiometric testing was completed using Conventional Audiometry techniques with insert earphones and supraural headphones. 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 Speech Recognition Thresholds were obtained at 70 dB HL in the right ear and at 100 dB HL in the left ear. Word Recognition Testing was completed at  90dB in the right ear and 105dB in the left, and Aldahir scored 64% for the right and 0% for the left. Lillian has binaural interference with left ear dragging down performance of the right. Brycen's UCL for speech was 95dB in the right ear.   Results:  The test results were reviewed with Mavrick, his wife and daughter. Daughter is primary care taker. Kweku has moderately severe relatively flat sensorineural  hearing loss bilaterally. The hearing test shows Idrees can understand some speech in the right ear and has no ability to understand speech in the left, likely from the stroke. He may benefit from wearing a hearing aid only in the right ear.   The hearing aids are completely impacted with wax and no sound is coming out. Using these is like putting on earplugs.  Samual tried a Geneticist, molecular during today's appointment. He understood much better with it on.   Daughter given a handout on Debrox, how to purchase a LoudEar, local hearing aid providers, and Aviraj's audiogram.   Recommendations: Have hearing aids cleaned and set to appropriate thresholds. May need more powerful speakers. May need to start only wearing one aid due to distortion of left ear.  Purchase a Geneticist, molecular for use while waiting on hearing aids to be cleaned and fixed. Bring this to appointments for him to use.  Use Debrox to keep ears clear of wax.  Family needs to greatly slow the rate of speech and break long sentences into manageable chunks.   The average adult processing speed is 160-190 words per a minute. Kamonte will process much less than this. Slower will help understanding much more than  being louder. Think of Jahaziel's processing as a "country road vs four lane highway". The information will be received, it just takes longer to get there, and small bits of information will be processed much smoother. Too much will make a traffic jam and he wont understand anything.   55 minutes spent testing and counseling on results.   If you have any questions please feel free to contact me at [phone].  Ammie Ferrier Stalnaker Au.D.  Audiologist   06/08/2023  9:39 AM  Cc: [name], MD

## 2023-06-11 NOTE — Progress Notes (Unsigned)
 [facility]  [address] [place],  [place]  [address] [phone]  Clinic Day:  06/12/2023  Referring physician: Weston Settle, MD   HISTORY OF PRESENT ILLNESS:  The patient is an 83 y.o. male  with high-grade B cell lymphoma, who comes in today to be evaluated before heading into his 2nd cycle of bendamustine/Rituxan.  The patient tolerated his first cycle of chemotherapy well.  His family was able to use as all of his previous skin lesions have essentially dissipated.  There are essentially scars where her lymphomatous skin lesions were previously.    PHYSICAL EXAM:  Blood pressure 131/72, pulse 60, resp. rate 14, height 6' (1.829 m), SpO2 96%. Wt Readings from Last 3 Encounters:  05/16/23 176 lb (79.8 kg)  05/15/23 180 lb (81.6 kg)  02/20/23 177 lb 1 oz (80.3 kg)   Body mass index is 23.87 kg/m. Performance status (ECOG): 4 - Bedbound Physical Exam Constitutional:      Appearance: Normal appearance. He is not ill-appearing.     Comments: A pleasant older gentleman who is in a wheelchair.  He looks better vs previous visits.    HENT:     Mouth/Throat:     Mouth: Mucous membranes are moist.     Pharynx: Oropharynx is clear. No oropharyngeal exudate or posterior oropharyngeal erythema.  Cardiovascular:     Rate and Rhythm: Normal rate and regular rhythm.     Heart sounds: No murmur heard.    No friction rub. No gallop.  Pulmonary:     Effort: Pulmonary effort is normal. No respiratory distress.     Breath sounds: Normal breath sounds. No wheezing, rhonchi or rales.  Abdominal:     General: Bowel sounds are normal. There is no distension.     Palpations: Abdomen is soft. There is no mass.     Tenderness: There is no abdominal tenderness.  Musculoskeletal:        General: No swelling.     Right upper arm: No swelling.     Right forearm: No edema.     Right lower leg: No edema.     Left lower leg: No edema.  Lymphadenopathy:      Cervical: No cervical adenopathy.     Upper Body:     Right upper body: No supraclavicular or axillary adenopathy.     Left upper body: No supraclavicular or axillary adenopathy.     Lower Body: No right inguinal adenopathy. No left inguinal adenopathy.  Skin:    General: Skin is warm.     Coloration: Skin is not jaundiced.     Findings: No lesion or rash.     Comments: Very faint remnants of his previously large skin lesions are present  Neurological:     General: No focal deficit present.     Mental Status: He is alert and oriented to person, place, and time. Mental status is at baseline.  Psychiatric:        Mood and Affect: Mood normal.        Behavior: Behavior normal.        Thought Content: Thought content normal.   PATHOLOGY:   LABS:      Latest Ref Rng & Units 06/12/2023   11:00 AM 05/07/2023    1:52 PM 05/01/2023    1:55 PM  CBC  WBC 4.0 - 10.5 K/uL 13.1  13.6  12.1   Hemoglobin 13.0 - 17.0 g/dL 91.4  78.2  14.9  Hematocrit 39.0 - 52.0 % 44.3  44.9  46.2   Platelets 150 - 400 K/uL 278  305  443       Latest Ref Rng & Units 06/12/2023   11:00 AM 05/07/2023    1:52 PM 05/01/2023    1:55 PM  CMP  Glucose 70 - 99 mg/dL 914  782  99   BUN 8 - 23 mg/dL 12  13  12    Creatinine 0.61 - 1.24 mg/dL 9.56  2.13  0.86   Sodium 135 - 145 mmol/L 136  137  140   Potassium 3.5 - 5.1 mmol/L 3.3  3.8  4.1   Chloride 98 - 111 mmol/L 99  101  102   CO2 22 - 32 mmol/L 27  27  27    Calcium 8.9 - 10.3 mg/dL 9.7  9.7  57.8   Total Protein 6.5 - 8.1 g/dL 6.9  6.8  7.1   Total Bilirubin 0.0 - 1.2 mg/dL 0.5  0.6  0.5   Alkaline Phos 38 - 126 U/L 66  72  74   AST 15 - 41 U/L 16  19  15    ALT 0 - 44 U/L 12  10  7     ASSESSMENT & PLAN:  An 83 y.o. 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  He will proceed with his 2nd cycle of Bendamustine/Rituxan today.  Based upon his exam, he already appears to be having a positive response to his  chemotherapy.  He also appears to be tolerating this regimen very well.  I will see this patient back in 4 weeks before we heads into his 3rd cycle of Bendamustine/Rituxan.  The patient and his family understand all the plans discussed today and are in agreement with them.  Hanne Kegg Kirby Funk, MD

## 2023-06-12 ENCOUNTER — Telehealth: Payer: Self-pay | Admitting: Oncology

## 2023-06-12 ENCOUNTER — Other Ambulatory Visit (HOSPITAL_BASED_OUTPATIENT_CLINIC_OR_DEPARTMENT_OTHER): Payer: Self-pay

## 2023-06-12 ENCOUNTER — Inpatient Hospital Stay: Payer: Medicare PPO | Admitting: Oncology

## 2023-06-12 ENCOUNTER — Inpatient Hospital Stay: Payer: Medicare PPO

## 2023-06-12 ENCOUNTER — Inpatient Hospital Stay: Payer: Medicare PPO | Attending: Oncology

## 2023-06-12 ENCOUNTER — Encounter: Payer: Self-pay | Admitting: Oncology

## 2023-06-12 ENCOUNTER — Other Ambulatory Visit: Payer: Self-pay | Admitting: Pharmacist

## 2023-06-12 VITALS — BP 150/68 | HR 97 | Temp 98.4°F | Resp 18

## 2023-06-12 VITALS — BP 131/72 | HR 60 | Resp 14 | Ht 72.0 in

## 2023-06-12 DIAGNOSIS — Z5112 Encounter for antineoplastic immunotherapy: Secondary | ICD-10-CM | POA: Insufficient documentation

## 2023-06-12 DIAGNOSIS — C8599 Non-Hodgkin lymphoma, unspecified, extranodal and solid organ sites: Secondary | ICD-10-CM

## 2023-06-12 DIAGNOSIS — Z5111 Encounter for antineoplastic chemotherapy: Secondary | ICD-10-CM | POA: Diagnosis present

## 2023-06-12 DIAGNOSIS — C8510 Unspecified B-cell lymphoma, unspecified site: Secondary | ICD-10-CM | POA: Insufficient documentation

## 2023-06-12 LAB — CBC WITH DIFFERENTIAL (CANCER CENTER ONLY)
Abs Immature Granulocytes: 0.07 10*3/uL (ref 0.00–0.07)
Basophils Absolute: 0.1 10*3/uL (ref 0.0–0.1)
Basophils Relative: 1 %
Eosinophils Absolute: 0.2 10*3/uL (ref 0.0–0.5)
Eosinophils Relative: 2 %
HCT: 44.3 % (ref 39.0–52.0)
Hemoglobin: 14 g/dL (ref 13.0–17.0)
Immature Granulocytes: 1 %
Lymphocytes Relative: 3 %
Lymphs Abs: 0.4 10*3/uL — ABNORMAL LOW (ref 0.7–4.0)
MCH: 24.6 pg — ABNORMAL LOW (ref 26.0–34.0)
MCHC: 31.6 g/dL (ref 30.0–36.0)
MCV: 77.9 fL — ABNORMAL LOW (ref 80.0–100.0)
Monocytes Absolute: 1.1 10*3/uL — ABNORMAL HIGH (ref 0.1–1.0)
Monocytes Relative: 8 %
Neutro Abs: 11.2 10*3/uL — ABNORMAL HIGH (ref 1.7–7.7)
Neutrophils Relative %: 85 %
Platelet Count: 278 10*3/uL (ref 150–400)
RBC: 5.69 MIL/uL (ref 4.22–5.81)
RDW: 16.1 % — ABNORMAL HIGH (ref 11.5–15.5)
WBC Count: 13.1 10*3/uL — ABNORMAL HIGH (ref 4.0–10.5)
nRBC: 0 % (ref 0.0–0.2)
nRBC: 0 /100{WBCs}

## 2023-06-12 LAB — CMP (CANCER CENTER ONLY)
ALT: 12 U/L (ref 0–44)
AST: 16 U/L (ref 15–41)
Albumin: 3.8 g/dL (ref 3.5–5.0)
Alkaline Phosphatase: 66 U/L (ref 38–126)
Anion gap: 10 (ref 5–15)
BUN: 12 mg/dL (ref 8–23)
CO2: 27 mmol/L (ref 22–32)
Calcium: 9.7 mg/dL (ref 8.9–10.3)
Chloride: 99 mmol/L (ref 98–111)
Creatinine: 0.76 mg/dL (ref 0.61–1.24)
GFR, Estimated: >60 mL/min (ref >60)
Glucose, Bld: 169 mg/dL — ABNORMAL HIGH (ref 70–99)
Potassium: 3.3 mmol/L — ABNORMAL LOW (ref 3.5–5.1)
Sodium: 136 mmol/L (ref 135–145)
Total Bilirubin: 0.5 mg/dL (ref 0.0–1.2)
Total Protein: 6.9 g/dL (ref 6.5–8.1)

## 2023-06-12 MED ORDER — MEPERIDINE HCL 25 MG/ML IJ SOLN
12.5000 mg | Freq: Once | INTRAMUSCULAR | Status: AC
  Administered 2023-06-12: 12.5 mg via INTRAVENOUS

## 2023-06-12 MED ORDER — PALONOSETRON HCL INJECTION 0.25 MG/5ML
0.2500 mg | Freq: Once | INTRAVENOUS | Status: AC
  Administered 2023-06-12: 0.25 mg via INTRAVENOUS
  Filled 2023-06-12: qty 5

## 2023-06-12 MED ORDER — ACETAMINOPHEN 325 MG PO TABS
650.0000 mg | ORAL_TABLET | Freq: Once | ORAL | Status: AC
  Administered 2023-06-12: 650 mg via ORAL
  Filled 2023-06-12: qty 2

## 2023-06-12 MED ORDER — RITUXIMAB-PVVR CHEMO 100 MG/10ML IV SOLN
375.0000 mg/m2 | Freq: Once | INTRAVENOUS | Status: AC
  Administered 2023-06-12: 800 mg via INTRAVENOUS
  Filled 2023-06-12: qty 50

## 2023-06-12 MED ORDER — SODIUM CHLORIDE 0.9 % IV SOLN
88.5000 mg/m2 | Freq: Once | INTRAVENOUS | Status: AC
  Administered 2023-06-12: 180 mg via INTRAVENOUS
  Filled 2023-06-12: qty 7.2

## 2023-06-12 MED ORDER — DIPHENHYDRAMINE HCL 25 MG PO CAPS
50.0000 mg | ORAL_CAPSULE | Freq: Once | ORAL | Status: AC
  Administered 2023-06-12: 50 mg via ORAL
  Filled 2023-06-12: qty 2

## 2023-06-12 MED ORDER — HEPARIN SOD (PORK) LOCK FLUSH 100 UNIT/ML IV SOLN
500.0000 [IU] | Freq: Once | INTRAVENOUS | Status: AC | PRN
  Administered 2023-06-12: 500 [IU]

## 2023-06-12 MED ORDER — SODIUM CHLORIDE 0.9 % IV SOLN: INTRAVENOUS | Status: DC

## 2023-06-12 MED ORDER — MEPERIDINE HCL 25 MG/ML IJ SOLN
12.5000 mg | Freq: Once | INTRAMUSCULAR | Status: AC
  Administered 2023-06-12: 12.5 mg via INTRAVENOUS
  Filled 2023-06-12: qty 1

## 2023-06-12 MED ORDER — SODIUM CHLORIDE 0.9% FLUSH
10.0000 mL | INTRAVENOUS | Status: DC | PRN
Start: 2023-06-12 — End: 2023-06-12
  Administered 2023-06-12: 10 mL

## 2023-06-12 MED ORDER — POTASSIUM CHLORIDE CRYS ER 20 MEQ PO TBCR
20.0000 meq | EXTENDED_RELEASE_TABLET | Freq: Every day | ORAL | 0 refills | Status: DC
  Filled 2023-06-12: qty 7, 7d supply, fill #0

## 2023-06-12 MED ORDER — DEXAMETHASONE SODIUM PHOSPHATE 10 MG/ML IJ SOLN
10.0000 mg | Freq: Once | INTRAMUSCULAR | Status: AC
  Administered 2023-06-12: 10 mg via INTRAVENOUS
  Filled 2023-06-12: qty 1

## 2023-06-12 NOTE — Progress Notes (Signed)
 1338- pt started shaking, chills. Harvin Hazel, PA in to assess patient with Darl Pikes, Pharmacy. Kelli verified to hold rituxan, give demerol see MAR.

## 2023-06-12 NOTE — Telephone Encounter (Signed)
 Patient has been scheduled for follow-up visit per 06/12/23 LOS.  Pt given an appt calendar with date and time.

## 2023-06-12 NOTE — Progress Notes (Signed)
..  Rapid Infusion Rituximab Pharmacist Evaluation  Jeffrey Acevedo is a 83 y.o. male being treated with rituximab for B Cell Lymphoma. This patient may be considered for RIR.   A pharmacist has verified the patient tolerated rituximab infusions per the [facility] standard infusion protocol without grade 3-4 infusion reactions. The treatment plan will be updated to reflect RIR if the patient qualifies per the checklist below:   Age > 59 years old Yes   Clinically significant cardiovascular disease No   Circulating lymphocyte count < 5000/uL prior to cycle two Yes  Lab Results  Component Value Date   LYMPHSABS 1.1 05/07/2023    Prior documented grade 3-4 infusion reaction to rituximab No   Prior documented grade 1-2 infusion reaction to rituximab (If YES, Pharmacist will confirm with Physician if patient is still a candidate for RIR) No   Previous rituximab infusion within the past 6 months Yes   Treatment Plan updated orders to reflect RIR Yes    [name] does meet the criteria for Rapid Infusion Rituximab. This patient is going to be switched to rapid infusion rituximab.   Georga Hacking Kayti Poss 06/12/23 8:59 AM

## 2023-06-12 NOTE — Patient Instructions (Signed)
Bendamustine Injection What is this medication? BENDAMUSTINE (BEN da MUS teen) treats leukemia and lymphoma. It works by slowing down the growth of cancer cells. This medicine may be used for other purposes; ask your health care provider or pharmacist if you have questions. COMMON BRAND NAME(S): Marilynne Halsted, VIVIMUSTA What should I tell my care team before I take this medication? They need to know if you have any of these conditions: Infection, especially a viral infection, such as chickenpox, cold sores, herpes Kidney disease Liver disease An unusual or allergic reaction to bendamustine, mannitol, other medications, foods, dyes, or preservatives Pregnant or trying to get pregnant Breast-feeding How should I use this medication? This medication is injected into a vein. It is given by your care team in a hospital or clinic setting. Talk to your care team about the use of this medication in children. Special care may be needed. Overdosage: If you think you have taken too much of this medicine contact a poison control center or emergency room at once. NOTE: This medicine is only for you. Do not share this medicine with others. What if I miss a dose? Keep appointments for follow-up doses. It is important not to miss your dose. Call your care team if you are unable to keep an appointment. What may interact with this medication? Do not take this medication with any of the following: Clozapine This medication may also interact with the following: Atazanavir Cimetidine Ciprofloxacin Enoxacin Fluvoxamine Medications for seizures, such as carbamazepine, phenobarbital Mexiletine Rifampin Tacrine Thiabendazole Zileuton This list may not describe all possible interactions. Give your health care provider a list of all the medicines, herbs, non-prescription drugs, or dietary supplements you use. Also tell them if you smoke, drink alcohol, or use illegal drugs. Some items may  interact with your medicine. What should I watch for while using this medication? Visit your care team for regular checks on your progress. This medication may make you feel generally unwell. This is not uncommon, as chemotherapy can affect healthy cells as well as cancer cells. Report any side effects. Continue your course of treatment even though you feel ill unless your care team tells you to stop. You may need blood work while taking this medication. This medication may increase your risk of getting an infection. Call your care team for advice if you get a fever, chills, sore throat, or other symptoms of a cold or flu. Do not treat yourself. Try to avoid being around people who are sick. This medication may cause serious skin reactions. They can happen weeks to months after starting the medication. Contact your care team right away if you notice fevers or flu-like symptoms with a rash. The rash may be red or purple and then turn into blisters or peeling of the skin. You may also notice a red rash with swelling of the face, lips, or lymph nodes in your neck or under your arms. In some patients, this medication may cause a serious brain infection that may cause death. If you have any problems seeing, thinking, speaking, walking, or standing, tell your care team right away. If you cannot reach your care team, urgently seek other source of medical care. This medication may increase your risk to bruise or bleed. Call your care team if you notice any unusual bleeding. Talk to your care team about your risk of cancer. You may be more at risk for certain types of cancer if you take this medication. Talk to your care team about your  risk of skin cancer. You may be more at risk for skin cancer if you take this medication. Talk to your care team if you or your partner wish to become pregnant or think either of you might be pregnant. This medication can cause serious birth defects if taken during pregnancy or for  up to 6 months after the last dose. A negative pregnancy test is required before starting this medication. A reliable form of contraception is recommended while taking this medication and for 6 months after the last dose. Talk to your care team about reliable forms of contraception. Wear a condom while taking this medication and for at least 3 months after the last dose. Do not breast-feed while taking this medication or for at least 1 week after the last dose. This medication may cause infertility. Talk to your care team if you are concerned about your fertility. What side effects may I notice from receiving this medication? Side effects that you should report to your care team as soon as possible: Allergic reactions--skin rash, itching, hives, swelling of the face, lips, tongue, or throat Infection--fever, chills, cough, sore throat, wounds that don't heal, pain or trouble when passing urine, general feeling of discomfort or being unwell Infusion reactions--chest pain, shortness of breath or trouble breathing, feeling faint or lightheaded Liver injury--right upper belly pain, loss of appetite, nausea, light-colored stool, dark yellow or brown urine, yellowing skin or eyes, unusual weakness or fatigue Low red blood cell level--unusual weakness or fatigue, dizziness, headache, trouble breathing Painful swelling, warmth, or redness of the skin, blisters or sores at the infusion site Rash, fever, and swollen lymph nodes Redness, blistering, peeling, or loosening of the skin, including inside the mouth Tumor lysis syndrome (TLS)--nausea, vomiting, diarrhea, decrease in the amount of urine, dark urine, unusual weakness or fatigue, confusion, muscle pain or cramps, fast or irregular heartbeat, joint pain Unusual bruising or bleeding Side effects that usually do not require medical attention (report to your care team if they continue or are bothersome): Diarrhea Fatigue Headache Loss of  appetite Nausea Vomiting This list may not describe all possible side effects. Call your doctor for medical advice about side effects. You may report side effects to FDA at 1-800-FDA-1088. Where should I keep my medication? This medication is given in a hospital or clinic. It will not be stored at home. NOTE: This sheet is a summary. It may not cover all possible information. If you have questions about this medicine, talk to your doctor, pharmacist, or health care provider.  2024 Elsevier/Gold Standard (2021-06-28 00:00:00)  Rituximab Injection What is this medication? RITUXIMAB (ri TUX i mab) treats leukemia and lymphoma. It works by blocking a protein that causes cancer cells to grow and multiply. This helps to slow or stop the spread of cancer cells. It may also be used to treat autoimmune conditions, such as arthritis. It works by slowing down an overactive immune system. It is a monoclonal antibody. This medicine may be used for other purposes; ask your health care provider or pharmacist if you have questions. COMMON BRAND NAME(S): RIABNI, Rituxan, RUXIENCE, truxima What should I tell my care team before I take this medication? They need to know if you have any of these conditions: Chest pain Heart disease Immune system problems Infection, such as chickenpox, cold sores, hepatitis B, herpes Irregular heartbeat or rhythm Kidney disease Low blood counts, such as low white cells, platelets, red cells Lung disease Recent or upcoming vaccine An unusual or allergic reaction to  rituximab, other medications, foods, dyes, or preservatives Pregnant or trying to get pregnant Breast-feeding How should I use this medication? This medication is injected into a vein. It is given by a care team in a hospital or clinic setting. A special MedGuide will be given to you before each treatment. Be sure to read this information carefully each time. Talk to your care team about the use of this  medication in children. While this medication may be prescribed for children as young as 6 months for selected conditions, precautions do apply. Overdosage: If you think you have taken too much of this medicine contact a poison control center or emergency room at once. NOTE: This medicine is only for you. Do not share this medicine with others. What if I miss a dose? Keep appointments for follow-up doses. It is important not to miss your dose. Call your care team if you are unable to keep an appointment. What may interact with this medication? Do not take this medication with any of the following: Live vaccines This medication may also interact with the following: Cisplatin This list may not describe all possible interactions. Give your health care provider a list of all the medicines, herbs, non-prescription drugs, or dietary supplements you use. Also tell them if you smoke, drink alcohol, or use illegal drugs. Some items may interact with your medicine. What should I watch for while using this medication? Your condition will be monitored carefully while you are receiving this medication. You may need blood work while taking this medication. This medication can cause serious infusion reactions. To reduce the risk your care team may give you other medications to take before receiving this one. Be sure to follow the directions from your care team. This medication may increase your risk of getting an infection. Call your care team for advice if you get a fever, chills, sore throat, or other symptoms of a cold or flu. Do not treat yourself. Try to avoid being around people who are sick. Call your care team if you are around anyone with measles, chickenpox, or if you develop sores or blisters that do not heal properly. Avoid taking medications that contain aspirin, acetaminophen, ibuprofen, naproxen, or ketoprofen unless instructed by your care team. These medications may hide a fever. This medication  may cause serious skin reactions. They can happen weeks to months after starting the medication. Contact your care team right away if you notice fevers or flu-like symptoms with a rash. The rash may be red or purple and then turn into blisters or peeling of the skin. You may also notice a red rash with swelling of the face, lips, or lymph nodes in your neck or under your arms. In some patients, this medication may cause a serious brain infection that may cause death. If you have any problems seeing, thinking, speaking, walking, or standing, tell your care team right away. If you cannot reach your care team, urgently seek another source of medical care. Talk to your care team if you may be pregnant. Serious birth defects can occur if you take this medication during pregnancy and for 12 months after the last dose. You will need a negative pregnancy test before starting this medication. Contraception is recommended while taking this medication and for 12 months after the last dose. Your care team can help you find the option that works for you. Do not breastfeed while taking this medication and for at least 6 months after the last dose. What side effects may  I notice from receiving this medication? Side effects that you should report to your care team as soon as possible: Allergic reactions or angioedema--skin rash, itching or hives, swelling of the face, eyes, lips, tongue, arms, or legs, trouble swallowing or breathing Bowel blockage--stomach cramping, unable to have a bowel movement or pass gas, loss of appetite, vomiting Dizziness, loss of balance or coordination, confusion or trouble speaking Heart attack--pain or tightness in the chest, shoulders, arms, or jaw, nausea, shortness of breath, cold or clammy skin, feeling faint or lightheaded Heart rhythm changes--fast or irregular heartbeat, dizziness, feeling faint or lightheaded, chest pain, trouble breathing Infection--fever, chills, cough, sore  throat, wounds that don't heal, pain or trouble when passing urine, general feeling of discomfort or being unwell Infusion reactions--chest pain, shortness of breath or trouble breathing, feeling faint or lightheaded Kidney injury--decrease in the amount of urine, swelling of the ankles, hands, or feet Liver injury--right upper belly pain, loss of appetite, nausea, light-colored stool, dark yellow or brown urine, yellowing skin or eyes, unusual weakness or fatigue Redness, blistering, peeling, or loosening of the skin, including inside the mouth Stomach pain that is severe, does not go away, or gets worse Tumor lysis syndrome (TLS)--nausea, vomiting, diarrhea, decrease in the amount of urine, dark urine, unusual weakness or fatigue, confusion, muscle pain or cramps, fast or irregular heartbeat, joint pain Side effects that usually do not require medical attention (report to your care team if they continue or are bothersome): Headache Joint pain Nausea Runny or stuffy nose Unusual weakness or fatigue This list may not describe all possible side effects. Call your doctor for medical advice about side effects. You may report side effects to FDA at 1-800-FDA-1088. Where should I keep my medication? This medication is given in a hospital or clinic. It will not be stored at home. NOTE: This sheet is a summary. It may not cover all possible information. If you have questions about this medicine, talk to your doctor, pharmacist, or health care provider.  2024 Elsevier/Gold Standard (2021-07-28 00:00:00)

## 2023-06-13 ENCOUNTER — Inpatient Hospital Stay: Payer: Medicare PPO

## 2023-06-13 VITALS — BP 99/55 | HR 98 | Temp 97.7°F | Resp 18 | Ht 72.0 in | Wt 180.2 lb

## 2023-06-13 DIAGNOSIS — C8599 Non-Hodgkin lymphoma, unspecified, extranodal and solid organ sites: Secondary | ICD-10-CM

## 2023-06-13 DIAGNOSIS — Z5112 Encounter for antineoplastic immunotherapy: Secondary | ICD-10-CM | POA: Diagnosis not present

## 2023-06-13 MED ORDER — HEPARIN SOD (PORK) LOCK FLUSH 100 UNIT/ML IV SOLN
500.0000 [IU] | Freq: Once | INTRAVENOUS | Status: AC | PRN
  Administered 2023-06-13: 500 [IU]

## 2023-06-13 MED ORDER — SODIUM CHLORIDE 0.9 % IV SOLN: INTRAVENOUS | Status: DC

## 2023-06-13 MED ORDER — SODIUM CHLORIDE 0.9% FLUSH
10.0000 mL | INTRAVENOUS | Status: DC | PRN
  Administered 2023-06-13: 10 mL

## 2023-06-13 MED ORDER — SODIUM CHLORIDE 0.9 % IV SOLN
88.5000 mg/m2 | Freq: Once | INTRAVENOUS | Status: AC
  Administered 2023-06-13: 180 mg via INTRAVENOUS
  Filled 2023-06-13: qty 7.2

## 2023-06-13 MED ORDER — DEXAMETHASONE SODIUM PHOSPHATE 10 MG/ML IJ SOLN
10.0000 mg | Freq: Once | INTRAMUSCULAR | Status: AC
  Administered 2023-06-13: 10 mg via INTRAVENOUS
  Filled 2023-06-13: qty 1

## 2023-06-13 NOTE — Progress Notes (Signed)
 Hypersensitivity Reaction note  Date of event: 06/12/23 Time of event: 1338 Generic name of drug involved: rituximab Name of provider notified of the hypersensitivity reaction: Dr. Margaret Pyle, PA, [place] [facility] Was agent that likely caused hypersensitivity reaction added to Allergies List within EMR? No, not needed- side effect Chain of events including reaction signs/symptoms, treatment administered, and outcome (e.g., drug resumed; drug discontinued; sent to Emergency Department; etc.) 1338 patient stated he was cold, had rigors shaking. [facility] called in to assess patient- gave orders for 12.5 of demerol (SEE MAR) pt still shaking- dr. Melvyn Neth in to assess patient to give another dose at 1359 of 12.5 demerol. Pt improved no more shaking or chills. 1424 restarted rituximab at 100 ml/hr. Increased to 150 ml/hr at 1456. Pt tolerated well for rest of treatment. Family updated- wife and daughter present and verbalized understanding. Pt sent home vitals stable.   Sherlyn Lick, RN

## 2023-06-13 NOTE — Patient Instructions (Signed)
Bendamustine Injection What is this medication? BENDAMUSTINE (BEN da MUS teen) treats leukemia and lymphoma. It works by slowing down the growth of cancer cells. This medicine may be used for other purposes; ask your health care provider or pharmacist if you have questions. COMMON BRAND NAME(S): Marilynne Halsted, VIVIMUSTA What should I tell my care team before I take this medication? They need to know if you have any of these conditions: Infection, especially a viral infection, such as chickenpox, cold sores, herpes Kidney disease Liver disease An unusual or allergic reaction to bendamustine, mannitol, other medications, foods, dyes, or preservatives Pregnant or trying to get pregnant Breast-feeding How should I use this medication? This medication is injected into a vein. It is given by your care team in a hospital or clinic setting. Talk to your care team about the use of this medication in children. Special care may be needed. Overdosage: If you think you have taken too much of this medicine contact a poison control center or emergency room at once. NOTE: This medicine is only for you. Do not share this medicine with others. What if I miss a dose? Keep appointments for follow-up doses. It is important not to miss your dose. Call your care team if you are unable to keep an appointment. What may interact with this medication? Do not take this medication with any of the following: Clozapine This medication may also interact with the following: Atazanavir Cimetidine Ciprofloxacin Enoxacin Fluvoxamine Medications for seizures, such as carbamazepine, phenobarbital Mexiletine Rifampin Tacrine Thiabendazole Zileuton This list may not describe all possible interactions. Give your health care provider a list of all the medicines, herbs, non-prescription drugs, or dietary supplements you use. Also tell them if you smoke, drink alcohol, or use illegal drugs. Some items may  interact with your medicine. What should I watch for while using this medication? Visit your care team for regular checks on your progress. This medication may make you feel generally unwell. This is not uncommon, as chemotherapy can affect healthy cells as well as cancer cells. Report any side effects. Continue your course of treatment even though you feel ill unless your care team tells you to stop. You may need blood work while taking this medication. This medication may increase your risk of getting an infection. Call your care team for advice if you get a fever, chills, sore throat, or other symptoms of a cold or flu. Do not treat yourself. Try to avoid being around people who are sick. This medication may cause serious skin reactions. They can happen weeks to months after starting the medication. Contact your care team right away if you notice fevers or flu-like symptoms with a rash. The rash may be red or purple and then turn into blisters or peeling of the skin. You may also notice a red rash with swelling of the face, lips, or lymph nodes in your neck or under your arms. In some patients, this medication may cause a serious brain infection that may cause death. If you have any problems seeing, thinking, speaking, walking, or standing, tell your care team right away. If you cannot reach your care team, urgently seek other source of medical care. This medication may increase your risk to bruise or bleed. Call your care team if you notice any unusual bleeding. Talk to your care team about your risk of cancer. You may be more at risk for certain types of cancer if you take this medication. Talk to your care team about your  risk of skin cancer. You may be more at risk for skin cancer if you take this medication. Talk to your care team if you or your partner wish to become pregnant or think either of you might be pregnant. This medication can cause serious birth defects if taken during pregnancy or for  up to 6 months after the last dose. A negative pregnancy test is required before starting this medication. A reliable form of contraception is recommended while taking this medication and for 6 months after the last dose. Talk to your care team about reliable forms of contraception. Wear a condom while taking this medication and for at least 3 months after the last dose. Do not breast-feed while taking this medication or for at least 1 week after the last dose. This medication may cause infertility. Talk to your care team if you are concerned about your fertility. What side effects may I notice from receiving this medication? Side effects that you should report to your care team as soon as possible: Allergic reactions--skin rash, itching, hives, swelling of the face, lips, tongue, or throat Infection--fever, chills, cough, sore throat, wounds that don't heal, pain or trouble when passing urine, general feeling of discomfort or being unwell Infusion reactions--chest pain, shortness of breath or trouble breathing, feeling faint or lightheaded Liver injury--right upper belly pain, loss of appetite, nausea, light-colored stool, dark yellow or brown urine, yellowing skin or eyes, unusual weakness or fatigue Low red blood cell level--unusual weakness or fatigue, dizziness, headache, trouble breathing Painful swelling, warmth, or redness of the skin, blisters or sores at the infusion site Rash, fever, and swollen lymph nodes Redness, blistering, peeling, or loosening of the skin, including inside the mouth Tumor lysis syndrome (TLS)--nausea, vomiting, diarrhea, decrease in the amount of urine, dark urine, unusual weakness or fatigue, confusion, muscle pain or cramps, fast or irregular heartbeat, joint pain Unusual bruising or bleeding Side effects that usually do not require medical attention (report to your care team if they continue or are bothersome): Diarrhea Fatigue Headache Loss of  appetite Nausea Vomiting This list may not describe all possible side effects. Call your doctor for medical advice about side effects. You may report side effects to FDA at 1-800-FDA-1088. Where should I keep my medication? This medication is given in a hospital or clinic. It will not be stored at home. NOTE: This sheet is a summary. It may not cover all possible information. If you have questions about this medicine, talk to your doctor, pharmacist, or health care provider.  2024 Elsevier/Gold Standard (2021-06-28 00:00:00)

## 2023-06-14 ENCOUNTER — Encounter (HOSPITAL_COMMUNITY): Payer: Self-pay

## 2023-06-15 ENCOUNTER — Encounter: Payer: Self-pay | Admitting: Oncology

## 2023-07-02 ENCOUNTER — Encounter: Payer: Self-pay | Admitting: Oncology

## 2023-07-10 ENCOUNTER — Inpatient Hospital Stay: Admitting: Oncology

## 2023-07-10 ENCOUNTER — Inpatient Hospital Stay

## 2023-07-11 ENCOUNTER — Other Ambulatory Visit (HOSPITAL_BASED_OUTPATIENT_CLINIC_OR_DEPARTMENT_OTHER): Payer: Self-pay

## 2023-07-11 ENCOUNTER — Inpatient Hospital Stay

## 2023-07-11 MED ORDER — COLCHICINE 0.6 MG PO TABS
0.6000 mg | ORAL_TABLET | Freq: Two times a day (BID) | ORAL | 0 refills | Status: DC
  Filled 2023-07-11: qty 60, 30d supply, fill #0

## 2023-07-11 MED ORDER — LOSARTAN POTASSIUM 25 MG PO TABS
50.0000 mg | ORAL_TABLET | Freq: Every day | ORAL | 0 refills | Status: DC
  Filled 2023-07-11: qty 30, 15d supply, fill #0

## 2023-07-11 NOTE — Telephone Encounter (Signed)
 Error

## 2023-07-22 NOTE — Progress Notes (Deleted)
 [facility]  [address] [place],  [place]  [address] [phone]  Clinic Day:  06/12/2023  Referring physician: Abbe Hoard., MD   HISTORY OF PRESENT ILLNESS:  The patient is an 83 y.o. male  with high-grade B cell lymphoma, who comes in today to be evaluated before heading into his 3rd cycle of bendamustine /Rituxan .  The patient tolerated his first cycle of chemotherapy well.    Of note, he was just hospitalized in mid April for altered mental status and an acute gouty flare.    His family was able to use as all of his previous skin lesions have essentially dissipated.  There are essentially scars where her lymphomatous skin lesions were previously.    PHYSICAL EXAM:  There were no vitals taken for this visit. Wt Readings from Last 3 Encounters:  06/13/23 180 lb 3.2 oz (81.7 kg)  05/16/23 176 lb (79.8 kg)  05/15/23 180 lb (81.6 kg)   There is no height or weight on file to calculate BMI. Performance status (ECOG): 4 - Bedbound Physical Exam Constitutional:      Appearance: Normal appearance. He is not ill-appearing.     Comments: A pleasant older gentleman who is in a wheelchair.  He looks better vs previous visits.    HENT:     Mouth/Throat:     Mouth: Mucous membranes are moist.     Pharynx: Oropharynx is clear. No oropharyngeal exudate or posterior oropharyngeal erythema.  Cardiovascular:     Rate and Rhythm: Normal rate and regular rhythm.     Heart sounds: No murmur heard.    No friction rub. No gallop.  Pulmonary:     Effort: Pulmonary effort is normal. No respiratory distress.     Breath sounds: Normal breath sounds. No wheezing, rhonchi or rales.  Abdominal:     General: Bowel sounds are normal. There is no distension.     Palpations: Abdomen is soft. There is no mass.     Tenderness: There is no abdominal tenderness.  Musculoskeletal:        General: No swelling.     Right upper arm: No swelling.     Right forearm: No  edema.     Right lower leg: No edema.     Left lower leg: No edema.  Lymphadenopathy:     Cervical: No cervical adenopathy.     Upper Body:     Right upper body: No supraclavicular or axillary adenopathy.     Left upper body: No supraclavicular or axillary adenopathy.     Lower Body: No right inguinal adenopathy. No left inguinal adenopathy.  Skin:    General: Skin is warm.     Coloration: Skin is not jaundiced.     Findings: No lesion or rash.     Comments: Very faint remnants of his previously large skin lesions are present  Neurological:     General: No focal deficit present.     Mental Status: He is alert and oriented to person, place, and time. Mental status is at baseline.  Psychiatric:        Mood and Affect: Mood normal.        Behavior: Behavior normal.        Thought Content: Thought content normal.  PATHOLOGY:   LABS:      Latest Ref Rng & Units 06/12/2023   11:00 AM 05/07/2023    1:52 PM 05/01/2023    1:55 PM  CBC  WBC 4.0 -  10.5 K/uL 13.1  13.6  12.1   Hemoglobin 13.0 - 17.0 g/dL 16.1  09.6  04.5   Hematocrit 39.0 - 52.0 % 44.3  44.9  46.2   Platelets 150 - 400 K/uL 278  305  443       Latest Ref Rng & Units 06/12/2023   11:00 AM 05/07/2023    1:52 PM 05/01/2023    1:55 PM  CMP  Glucose 70 - 99 mg/dL 409  811  99   BUN 8 - 23 mg/dL 12  13  12    Creatinine 0.61 - 1.24 mg/dL 9.14  7.82  9.56   Sodium 135 - 145 mmol/L 136  137  140   Potassium 3.5 - 5.1 mmol/L 3.3  3.8  4.1   Chloride 98 - 111 mmol/L 99  101  102   CO2 22 - 32 mmol/L 27  27  27    Calcium  8.9 - 10.3 mg/dL 9.7  9.7  21.3   Total Protein 6.5 - 8.1 g/dL 6.9  6.8  7.1   Total Bilirubin 0.0 - 1.2 mg/dL 0.5  0.6  0.5   Alkaline Phos 38 - 126 U/L 66  72  74   AST 15 - 41 U/L 16  19  15    ALT 0 - 44 U/L 12  10  7     ASSESSMENT & PLAN:  An 83 y.o. 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  He will proceed with his 2nd cycle of Bendamustine /Rituxan   today.  Based upon his exam, he already appears to be having a positive response to his chemotherapy.  He also appears to be tolerating this regimen very well.  I will see this patient back in 4 weeks before we heads into his 3rd cycle of Bendamustine /Rituxan .  The patient and his family understand all the plans discussed today and are in agreement with them.  Yuvraj Pfeifer Felicia Horde, MD

## 2023-07-23 ENCOUNTER — Other Ambulatory Visit (HOSPITAL_BASED_OUTPATIENT_CLINIC_OR_DEPARTMENT_OTHER): Payer: Self-pay

## 2023-07-23 ENCOUNTER — Inpatient Hospital Stay: Admitting: Oncology

## 2023-07-23 ENCOUNTER — Inpatient Hospital Stay

## 2023-07-23 ENCOUNTER — Inpatient Hospital Stay: Attending: Oncology

## 2023-07-23 ENCOUNTER — Other Ambulatory Visit: Payer: Self-pay | Admitting: Oncology

## 2023-07-23 ENCOUNTER — Encounter: Payer: Self-pay | Admitting: Oncology

## 2023-07-23 VITALS — BP 113/73 | HR 59 | Temp 97.6°F | Resp 18

## 2023-07-23 DIAGNOSIS — C8510 Unspecified B-cell lymphoma, unspecified site: Secondary | ICD-10-CM | POA: Insufficient documentation

## 2023-07-23 DIAGNOSIS — Z5111 Encounter for antineoplastic chemotherapy: Secondary | ICD-10-CM | POA: Diagnosis present

## 2023-07-23 DIAGNOSIS — C8599 Non-Hodgkin lymphoma, unspecified, extranodal and solid organ sites: Secondary | ICD-10-CM | POA: Diagnosis not present

## 2023-07-23 DIAGNOSIS — Z5112 Encounter for antineoplastic immunotherapy: Secondary | ICD-10-CM | POA: Insufficient documentation

## 2023-07-23 LAB — CBC WITH DIFFERENTIAL (CANCER CENTER ONLY)
Abs Immature Granulocytes: 0.05 10*3/uL (ref 0.00–0.07)
Basophils Absolute: 0.1 10*3/uL (ref 0.0–0.1)
Basophils Relative: 1 %
Eosinophils Absolute: 0.7 10*3/uL — ABNORMAL HIGH (ref 0.0–0.5)
Eosinophils Relative: 6 %
HCT: 43.6 % (ref 39.0–52.0)
Hemoglobin: 14.1 g/dL (ref 13.0–17.0)
Immature Granulocytes: 1 %
Lymphocytes Relative: 3 %
Lymphs Abs: 0.3 10*3/uL — ABNORMAL LOW (ref 0.7–4.0)
MCH: 25.7 pg — ABNORMAL LOW (ref 26.0–34.0)
MCHC: 32.3 g/dL (ref 30.0–36.0)
MCV: 79.6 fL — ABNORMAL LOW (ref 80.0–100.0)
Monocytes Absolute: 1.1 10*3/uL — ABNORMAL HIGH (ref 0.1–1.0)
Monocytes Relative: 10 %
Neutro Abs: 8.7 10*3/uL — ABNORMAL HIGH (ref 1.7–7.7)
Neutrophils Relative %: 79 %
Platelet Count: 277 10*3/uL (ref 150–400)
RBC: 5.48 MIL/uL (ref 4.22–5.81)
RDW: 18.8 % — ABNORMAL HIGH (ref 11.5–15.5)
WBC Count: 10.9 10*3/uL — ABNORMAL HIGH (ref 4.0–10.5)
nRBC: 0 % (ref 0.0–0.2)
nRBC: 0 /100{WBCs}

## 2023-07-23 LAB — CMP (CANCER CENTER ONLY)
ALT: 17 U/L (ref 0–44)
AST: 17 U/L (ref 15–41)
Albumin: 3.8 g/dL (ref 3.5–5.0)
Alkaline Phosphatase: 63 U/L (ref 38–126)
Anion gap: 10 (ref 5–15)
BUN: 14 mg/dL (ref 8–23)
CO2: 29 mmol/L (ref 22–32)
Calcium: 9.8 mg/dL (ref 8.9–10.3)
Chloride: 101 mmol/L (ref 98–111)
Creatinine: 0.84 mg/dL (ref 0.61–1.24)
GFR, Estimated: >60 mL/min (ref >60)
Glucose, Bld: 93 mg/dL (ref 70–99)
Potassium: 3.8 mmol/L (ref 3.5–5.1)
Sodium: 139 mmol/L (ref 135–145)
Total Bilirubin: 0.5 mg/dL (ref 0.0–1.2)
Total Protein: 6.5 g/dL (ref 6.5–8.1)

## 2023-07-23 MED ORDER — CLOPIDOGREL BISULFATE 75 MG PO TABS
75.0000 mg | ORAL_TABLET | Freq: Every day | ORAL | 3 refills | Status: DC
  Filled 2023-07-23: qty 90, 90d supply, fill #0
  Filled 2023-10-15: qty 90, 90d supply, fill #1
  Filled 2024-01-17 – 2024-02-12 (×3): qty 90, 90d supply, fill #2

## 2023-07-23 NOTE — Progress Notes (Signed)
 [facility]  [address] [place],  [place]  [address] [phone]  Clinic Day:  07/23/2023  Referring physician: Abbe Hoard., MD   HISTORY OF PRESENT ILLNESS:  The patient is an 83 y.o. male  with high-grade B cell lymphoma, who comes in today to be evaluated before heading into his 3rd cycle of bendamustine /Rituxan .  The patient was scheduled to receive his third cycle of treatment 2 weeks ago.  However, he was hospitalized for more cellulitis in his left wrist/forearm, which required days of IV antibiotics to alleviate.  Since then, his cellulitis has completely been eradicated.  He claims to be tolerating his palliative chemotherapy extremely well.  His family is also pleased with how well he is tolerating it.  They are also pleased as no new skin lesions or lymphadenopathy has developed since Bendamustine /Rituxan  started.  In fact, there are only scarred remnants where his violaceous skin lesions highlighting his high-grade lymphoma was previously located.  PHYSICAL EXAM:  Blood pressure 113/73, pulse (!) 59, temperature 97.6 F (36.4 C), temperature source Oral, resp. rate 18, SpO2 96%. Wt Readings from Last 3 Encounters:  06/13/23 180 lb 3.2 oz (81.7 kg)  05/16/23 176 lb (79.8 kg)  05/15/23 180 lb (81.6 kg)   There is no height or weight on file to calculate BMI. Performance status (ECOG): 4 - Bedbound Physical Exam Constitutional:      Appearance: Normal appearance. He is not ill-appearing.     Comments: A pleasant older gentleman who is in a wheelchair.  He looks better vs previous visits.    HENT:     Mouth/Throat:     Mouth: Mucous membranes are moist.     Pharynx: Oropharynx is clear. No oropharyngeal exudate or posterior oropharyngeal erythema.  Cardiovascular:     Rate and Rhythm: Normal rate and regular rhythm.     Heart sounds: No murmur heard.    No friction rub. No gallop.  Pulmonary:     Effort: Pulmonary effort is normal.  No respiratory distress.     Breath sounds: Normal breath sounds. No wheezing, rhonchi or rales.  Abdominal:     General: Bowel sounds are normal. There is no distension.     Palpations: Abdomen is soft. There is no mass.     Tenderness: There is no abdominal tenderness.  Musculoskeletal:        General: No swelling.     Right upper arm: No swelling.     Right forearm: No edema.     Right lower leg: No edema.     Left lower leg: No edema.  Lymphadenopathy:     Cervical: No cervical adenopathy.     Upper Body:     Right upper body: No supraclavicular or axillary adenopathy.     Left upper body: No supraclavicular or axillary adenopathy.     Lower Body: No right inguinal adenopathy. No left inguinal adenopathy.  Skin:    General: Skin is warm.     Coloration: Skin is not jaundiced.     Findings: No lesion or rash.     Comments: Very faint remnants of his previously large skin lesions were present  Neurological:     General: No focal deficit present.     Mental Status: He is alert and oriented to person, place, and time. Mental status is at baseline.  Psychiatric:        Mood and Affect: Mood normal.  Behavior: Behavior normal.        Thought Content: Thought content normal.   PATHOLOGY:   LABS:      Latest Ref Rng & Units 07/23/2023    2:09 PM 06/12/2023   11:00 AM 05/07/2023    1:52 PM  CBC  WBC 4.0 - 10.5 K/uL 10.9  13.1  13.6   Hemoglobin 13.0 - 17.0 g/dL 21.3  08.6  57.8   Hematocrit 39.0 - 52.0 % 43.6  44.3  44.9   Platelets 150 - 400 K/uL 277  278  305       Latest Ref Rng & Units 07/23/2023    2:09 PM 06/12/2023   11:00 AM 05/07/2023    1:52 PM  CMP  Glucose 70 - 99 mg/dL 93  469  629   BUN 8 - 23 mg/dL 14  12  13    Creatinine 0.61 - 1.24 mg/dL 5.28  4.13  2.44   Sodium 135 - 145 mmol/L 139  136  137   Potassium 3.5 - 5.1 mmol/L 3.8  3.3  3.8   Chloride 98 - 111 mmol/L 101  99  101   CO2 22 - 32 mmol/L 29  27  27    Calcium  8.9 - 10.3 mg/dL 9.8  9.7  9.7    Total Protein 6.5 - 8.1 g/dL 6.5  6.9  6.8   Total Bilirubin 0.0 - 1.2 mg/dL 0.5  0.5  0.6   Alkaline Phos 38 - 126 U/L 63  66  72   AST 15 - 41 U/L 17  16  19    ALT 0 - 44 U/L 17  12  10     ASSESSMENT & PLAN:  An 83 y.o. male with stage IVA diffuse large B lymphoma, ABC subtype.  He will proceed with his 3rd cycle of Bendamustine /Rituxan  tomorrow.  Based upon his exam, he clearly has had a positive response to his chemotherapy.  He also appears to be tolerating this regimen very well.  I will see this patient back in early June 2025 before we heads into his 4th cycle of Bendamustine /Rituxan . A PET scan will be done a day before his next visit to ascertain his new disease baseline after 3 cycles of Bendamustin/Rituxan .  The patient and his family understand all the plans discussed today and are in agreement with them.  Daishon Chui Felicia Horde, MD

## 2023-07-24 ENCOUNTER — Inpatient Hospital Stay

## 2023-07-24 ENCOUNTER — Encounter: Payer: Self-pay | Admitting: Oncology

## 2023-07-24 ENCOUNTER — Other Ambulatory Visit (HOSPITAL_BASED_OUTPATIENT_CLINIC_OR_DEPARTMENT_OTHER): Payer: Self-pay

## 2023-07-24 VITALS — BP 144/80 | HR 52 | Temp 98.1°F | Resp 18 | Ht 72.0 in | Wt 166.4 lb

## 2023-07-24 DIAGNOSIS — Z5112 Encounter for antineoplastic immunotherapy: Secondary | ICD-10-CM | POA: Diagnosis not present

## 2023-07-24 DIAGNOSIS — C8599 Non-Hodgkin lymphoma, unspecified, extranodal and solid organ sites: Secondary | ICD-10-CM

## 2023-07-24 MED ORDER — PALONOSETRON HCL INJECTION 0.25 MG/5ML
0.2500 mg | Freq: Once | INTRAVENOUS | Status: AC
  Administered 2023-07-24: 0.25 mg via INTRAVENOUS
  Filled 2023-07-24: qty 5

## 2023-07-24 MED ORDER — HEPARIN SOD (PORK) LOCK FLUSH 100 UNIT/ML IV SOLN
500.0000 [IU] | Freq: Once | INTRAVENOUS | Status: AC | PRN
  Administered 2023-07-24: 500 [IU]

## 2023-07-24 MED ORDER — DEXAMETHASONE SODIUM PHOSPHATE 10 MG/ML IJ SOLN
10.0000 mg | Freq: Once | INTRAMUSCULAR | Status: AC
  Administered 2023-07-24: 10 mg via INTRAVENOUS
  Filled 2023-07-24: qty 1

## 2023-07-24 MED ORDER — ACETAMINOPHEN 325 MG PO TABS
650.0000 mg | ORAL_TABLET | Freq: Once | ORAL | Status: AC
  Administered 2023-07-24: 650 mg via ORAL
  Filled 2023-07-24: qty 2

## 2023-07-24 MED ORDER — SODIUM CHLORIDE 0.9 % IV SOLN
180.0000 mg | Freq: Once | INTRAVENOUS | Status: AC
  Administered 2023-07-24: 180 mg via INTRAVENOUS
  Filled 2023-07-24: qty 7.2

## 2023-07-24 MED ORDER — SODIUM CHLORIDE 0.9 % IV SOLN
375.0000 mg/m2 | Freq: Once | INTRAVENOUS | Status: AC
  Administered 2023-07-24: 800 mg via INTRAVENOUS
  Filled 2023-07-24: qty 50

## 2023-07-24 MED ORDER — DIPHENHYDRAMINE HCL 25 MG PO CAPS
50.0000 mg | ORAL_CAPSULE | Freq: Once | ORAL | Status: AC
  Administered 2023-07-24: 50 mg via ORAL
  Filled 2023-07-24: qty 2

## 2023-07-24 MED ORDER — SODIUM CHLORIDE 0.9% FLUSH
10.0000 mL | INTRAVENOUS | Status: DC | PRN
  Administered 2023-07-24: 10 mL

## 2023-07-24 MED ORDER — SODIUM CHLORIDE 0.9 % IV SOLN: INTRAVENOUS | Status: DC

## 2023-07-24 NOTE — Progress Notes (Signed)
 Decrease Bendeka  to 180mg . Continue 180 mg (90 mg/m2) as per previous cycles.  Jobe Mulder [place], [place], BCPS, BCOP 07/24/2023 9:38 AM

## 2023-07-24 NOTE — Patient Instructions (Signed)
Bendamustine Injection What is this medication? BENDAMUSTINE (BEN da MUS teen) treats leukemia and lymphoma. It works by slowing down the growth of cancer cells. This medicine may be used for other purposes; ask your health care provider or pharmacist if you have questions. COMMON BRAND NAME(S): Marilynne Halsted, VIVIMUSTA What should I tell my care team before I take this medication? They need to know if you have any of these conditions: Infection, especially a viral infection, such as chickenpox, cold sores, herpes Kidney disease Liver disease An unusual or allergic reaction to bendamustine, mannitol, other medications, foods, dyes, or preservatives Pregnant or trying to get pregnant Breast-feeding How should I use this medication? This medication is injected into a vein. It is given by your care team in a hospital or clinic setting. Talk to your care team about the use of this medication in children. Special care may be needed. Overdosage: If you think you have taken too much of this medicine contact a poison control center or emergency room at once. NOTE: This medicine is only for you. Do not share this medicine with others. What if I miss a dose? Keep appointments for follow-up doses. It is important not to miss your dose. Call your care team if you are unable to keep an appointment. What may interact with this medication? Do not take this medication with any of the following: Clozapine This medication may also interact with the following: Atazanavir Cimetidine Ciprofloxacin Enoxacin Fluvoxamine Medications for seizures, such as carbamazepine, phenobarbital Mexiletine Rifampin Tacrine Thiabendazole Zileuton This list may not describe all possible interactions. Give your health care provider a list of all the medicines, herbs, non-prescription drugs, or dietary supplements you use. Also tell them if you smoke, drink alcohol, or use illegal drugs. Some items may  interact with your medicine. What should I watch for while using this medication? Visit your care team for regular checks on your progress. This medication may make you feel generally unwell. This is not uncommon, as chemotherapy can affect healthy cells as well as cancer cells. Report any side effects. Continue your course of treatment even though you feel ill unless your care team tells you to stop. You may need blood work while taking this medication. This medication may increase your risk of getting an infection. Call your care team for advice if you get a fever, chills, sore throat, or other symptoms of a cold or flu. Do not treat yourself. Try to avoid being around people who are sick. This medication may cause serious skin reactions. They can happen weeks to months after starting the medication. Contact your care team right away if you notice fevers or flu-like symptoms with a rash. The rash may be red or purple and then turn into blisters or peeling of the skin. You may also notice a red rash with swelling of the face, lips, or lymph nodes in your neck or under your arms. In some patients, this medication may cause a serious brain infection that may cause death. If you have any problems seeing, thinking, speaking, walking, or standing, tell your care team right away. If you cannot reach your care team, urgently seek other source of medical care. This medication may increase your risk to bruise or bleed. Call your care team if you notice any unusual bleeding. Talk to your care team about your risk of cancer. You may be more at risk for certain types of cancer if you take this medication. Talk to your care team about your  risk of skin cancer. You may be more at risk for skin cancer if you take this medication. Talk to your care team if you or your partner wish to become pregnant or think either of you might be pregnant. This medication can cause serious birth defects if taken during pregnancy or for  up to 6 months after the last dose. A negative pregnancy test is required before starting this medication. A reliable form of contraception is recommended while taking this medication and for 6 months after the last dose. Talk to your care team about reliable forms of contraception. Wear a condom while taking this medication and for at least 3 months after the last dose. Do not breast-feed while taking this medication or for at least 1 week after the last dose. This medication may cause infertility. Talk to your care team if you are concerned about your fertility. What side effects may I notice from receiving this medication? Side effects that you should report to your care team as soon as possible: Allergic reactions--skin rash, itching, hives, swelling of the face, lips, tongue, or throat Infection--fever, chills, cough, sore throat, wounds that don't heal, pain or trouble when passing urine, general feeling of discomfort or being unwell Infusion reactions--chest pain, shortness of breath or trouble breathing, feeling faint or lightheaded Liver injury--right upper belly pain, loss of appetite, nausea, light-colored stool, dark yellow or brown urine, yellowing skin or eyes, unusual weakness or fatigue Low red blood cell level--unusual weakness or fatigue, dizziness, headache, trouble breathing Painful swelling, warmth, or redness of the skin, blisters or sores at the infusion site Rash, fever, and swollen lymph nodes Redness, blistering, peeling, or loosening of the skin, including inside the mouth Tumor lysis syndrome (TLS)--nausea, vomiting, diarrhea, decrease in the amount of urine, dark urine, unusual weakness or fatigue, confusion, muscle pain or cramps, fast or irregular heartbeat, joint pain Unusual bruising or bleeding Side effects that usually do not require medical attention (report to your care team if they continue or are bothersome): Diarrhea Fatigue Headache Loss of  appetite Nausea Vomiting This list may not describe all possible side effects. Call your doctor for medical advice about side effects. You may report side effects to FDA at 1-800-FDA-1088. Where should I keep my medication? This medication is given in a hospital or clinic. It will not be stored at home. NOTE: This sheet is a summary. It may not cover all possible information. If you have questions about this medicine, talk to your doctor, pharmacist, or health care provider.  2024 Elsevier/Gold Standard (2021-06-28 00:00:00)  Rituximab Injection What is this medication? RITUXIMAB (ri TUX i mab) treats leukemia and lymphoma. It works by blocking a protein that causes cancer cells to grow and multiply. This helps to slow or stop the spread of cancer cells. It may also be used to treat autoimmune conditions, such as arthritis. It works by slowing down an overactive immune system. It is a monoclonal antibody. This medicine may be used for other purposes; ask your health care provider or pharmacist if you have questions. COMMON BRAND NAME(S): RIABNI, Rituxan, RUXIENCE, truxima What should I tell my care team before I take this medication? They need to know if you have any of these conditions: Chest pain Heart disease Immune system problems Infection, such as chickenpox, cold sores, hepatitis B, herpes Irregular heartbeat or rhythm Kidney disease Low blood counts, such as low white cells, platelets, red cells Lung disease Recent or upcoming vaccine An unusual or allergic reaction to  rituximab, other medications, foods, dyes, or preservatives Pregnant or trying to get pregnant Breast-feeding How should I use this medication? This medication is injected into a vein. It is given by a care team in a hospital or clinic setting. A special MedGuide will be given to you before each treatment. Be sure to read this information carefully each time. Talk to your care team about the use of this  medication in children. While this medication may be prescribed for children as young as 6 months for selected conditions, precautions do apply. Overdosage: If you think you have taken too much of this medicine contact a poison control center or emergency room at once. NOTE: This medicine is only for you. Do not share this medicine with others. What if I miss a dose? Keep appointments for follow-up doses. It is important not to miss your dose. Call your care team if you are unable to keep an appointment. What may interact with this medication? Do not take this medication with any of the following: Live vaccines This medication may also interact with the following: Cisplatin This list may not describe all possible interactions. Give your health care provider a list of all the medicines, herbs, non-prescription drugs, or dietary supplements you use. Also tell them if you smoke, drink alcohol, or use illegal drugs. Some items may interact with your medicine. What should I watch for while using this medication? Your condition will be monitored carefully while you are receiving this medication. You may need blood work while taking this medication. This medication can cause serious infusion reactions. To reduce the risk your care team may give you other medications to take before receiving this one. Be sure to follow the directions from your care team. This medication may increase your risk of getting an infection. Call your care team for advice if you get a fever, chills, sore throat, or other symptoms of a cold or flu. Do not treat yourself. Try to avoid being around people who are sick. Call your care team if you are around anyone with measles, chickenpox, or if you develop sores or blisters that do not heal properly. Avoid taking medications that contain aspirin, acetaminophen, ibuprofen, naproxen, or ketoprofen unless instructed by your care team. These medications may hide a fever. This medication  may cause serious skin reactions. They can happen weeks to months after starting the medication. Contact your care team right away if you notice fevers or flu-like symptoms with a rash. The rash may be red or purple and then turn into blisters or peeling of the skin. You may also notice a red rash with swelling of the face, lips, or lymph nodes in your neck or under your arms. In some patients, this medication may cause a serious brain infection that may cause death. If you have any problems seeing, thinking, speaking, walking, or standing, tell your care team right away. If you cannot reach your care team, urgently seek another source of medical care. Talk to your care team if you may be pregnant. Serious birth defects can occur if you take this medication during pregnancy and for 12 months after the last dose. You will need a negative pregnancy test before starting this medication. Contraception is recommended while taking this medication and for 12 months after the last dose. Your care team can help you find the option that works for you. Do not breastfeed while taking this medication and for at least 6 months after the last dose. What side effects may  I notice from receiving this medication? Side effects that you should report to your care team as soon as possible: Allergic reactions or angioedema--skin rash, itching or hives, swelling of the face, eyes, lips, tongue, arms, or legs, trouble swallowing or breathing Bowel blockage--stomach cramping, unable to have a bowel movement or pass gas, loss of appetite, vomiting Dizziness, loss of balance or coordination, confusion or trouble speaking Heart attack--pain or tightness in the chest, shoulders, arms, or jaw, nausea, shortness of breath, cold or clammy skin, feeling faint or lightheaded Heart rhythm changes--fast or irregular heartbeat, dizziness, feeling faint or lightheaded, chest pain, trouble breathing Infection--fever, chills, cough, sore  throat, wounds that don't heal, pain or trouble when passing urine, general feeling of discomfort or being unwell Infusion reactions--chest pain, shortness of breath or trouble breathing, feeling faint or lightheaded Kidney injury--decrease in the amount of urine, swelling of the ankles, hands, or feet Liver injury--right upper belly pain, loss of appetite, nausea, light-colored stool, dark yellow or brown urine, yellowing skin or eyes, unusual weakness or fatigue Redness, blistering, peeling, or loosening of the skin, including inside the mouth Stomach pain that is severe, does not go away, or gets worse Tumor lysis syndrome (TLS)--nausea, vomiting, diarrhea, decrease in the amount of urine, dark urine, unusual weakness or fatigue, confusion, muscle pain or cramps, fast or irregular heartbeat, joint pain Side effects that usually do not require medical attention (report to your care team if they continue or are bothersome): Headache Joint pain Nausea Runny or stuffy nose Unusual weakness or fatigue This list may not describe all possible side effects. Call your doctor for medical advice about side effects. You may report side effects to FDA at 1-800-FDA-1088. Where should I keep my medication? This medication is given in a hospital or clinic. It will not be stored at home. NOTE: This sheet is a summary. It may not cover all possible information. If you have questions about this medicine, talk to your doctor, pharmacist, or health care provider.  2024 Elsevier/Gold Standard (2021-07-28 00:00:00)

## 2023-07-25 ENCOUNTER — Inpatient Hospital Stay

## 2023-07-25 ENCOUNTER — Other Ambulatory Visit (HOSPITAL_BASED_OUTPATIENT_CLINIC_OR_DEPARTMENT_OTHER): Payer: Self-pay

## 2023-07-25 VITALS — BP 171/82 | HR 83 | Temp 97.6°F | Resp 22

## 2023-07-25 DIAGNOSIS — C8599 Non-Hodgkin lymphoma, unspecified, extranodal and solid organ sites: Secondary | ICD-10-CM

## 2023-07-25 DIAGNOSIS — Z5112 Encounter for antineoplastic immunotherapy: Secondary | ICD-10-CM | POA: Diagnosis not present

## 2023-07-25 MED ORDER — SODIUM CHLORIDE 0.9 % IV SOLN: INTRAVENOUS | Status: DC

## 2023-07-25 MED ORDER — HEPARIN SOD (PORK) LOCK FLUSH 100 UNIT/ML IV SOLN
500.0000 [IU] | Freq: Once | INTRAVENOUS | Status: AC | PRN
  Administered 2023-07-25: 500 [IU]

## 2023-07-25 MED ORDER — SODIUM CHLORIDE 0.9% FLUSH
10.0000 mL | INTRAVENOUS | Status: DC | PRN
  Administered 2023-07-25: 10 mL

## 2023-07-25 MED ORDER — DEXAMETHASONE SODIUM PHOSPHATE 10 MG/ML IJ SOLN
10.0000 mg | Freq: Once | INTRAMUSCULAR | Status: AC
  Administered 2023-07-25: 10 mg via INTRAVENOUS
  Filled 2023-07-25: qty 1

## 2023-07-25 MED ORDER — SODIUM CHLORIDE 0.9 % IV SOLN
180.0000 mg | Freq: Once | INTRAVENOUS | Status: AC
  Administered 2023-07-25: 180 mg via INTRAVENOUS
  Filled 2023-07-25: qty 7.2

## 2023-07-25 NOTE — Patient Instructions (Signed)
Bendamustine Injection What is this medication? BENDAMUSTINE (BEN da MUS teen) treats leukemia and lymphoma. It works by slowing down the growth of cancer cells. This medicine may be used for other purposes; ask your health care provider or pharmacist if you have questions. COMMON BRAND NAME(S): Marilynne Halsted, VIVIMUSTA What should I tell my care team before I take this medication? They need to know if you have any of these conditions: Infection, especially a viral infection, such as chickenpox, cold sores, herpes Kidney disease Liver disease An unusual or allergic reaction to bendamustine, mannitol, other medications, foods, dyes, or preservatives Pregnant or trying to get pregnant Breast-feeding How should I use this medication? This medication is injected into a vein. It is given by your care team in a hospital or clinic setting. Talk to your care team about the use of this medication in children. Special care may be needed. Overdosage: If you think you have taken too much of this medicine contact a poison control center or emergency room at once. NOTE: This medicine is only for you. Do not share this medicine with others. What if I miss a dose? Keep appointments for follow-up doses. It is important not to miss your dose. Call your care team if you are unable to keep an appointment. What may interact with this medication? Do not take this medication with any of the following: Clozapine This medication may also interact with the following: Atazanavir Cimetidine Ciprofloxacin Enoxacin Fluvoxamine Medications for seizures, such as carbamazepine, phenobarbital Mexiletine Rifampin Tacrine Thiabendazole Zileuton This list may not describe all possible interactions. Give your health care provider a list of all the medicines, herbs, non-prescription drugs, or dietary supplements you use. Also tell them if you smoke, drink alcohol, or use illegal drugs. Some items may  interact with your medicine. What should I watch for while using this medication? Visit your care team for regular checks on your progress. This medication may make you feel generally unwell. This is not uncommon, as chemotherapy can affect healthy cells as well as cancer cells. Report any side effects. Continue your course of treatment even though you feel ill unless your care team tells you to stop. You may need blood work while taking this medication. This medication may increase your risk of getting an infection. Call your care team for advice if you get a fever, chills, sore throat, or other symptoms of a cold or flu. Do not treat yourself. Try to avoid being around people who are sick. This medication may cause serious skin reactions. They can happen weeks to months after starting the medication. Contact your care team right away if you notice fevers or flu-like symptoms with a rash. The rash may be red or purple and then turn into blisters or peeling of the skin. You may also notice a red rash with swelling of the face, lips, or lymph nodes in your neck or under your arms. In some patients, this medication may cause a serious brain infection that may cause death. If you have any problems seeing, thinking, speaking, walking, or standing, tell your care team right away. If you cannot reach your care team, urgently seek other source of medical care. This medication may increase your risk to bruise or bleed. Call your care team if you notice any unusual bleeding. Talk to your care team about your risk of cancer. You may be more at risk for certain types of cancer if you take this medication. Talk to your care team about your  risk of skin cancer. You may be more at risk for skin cancer if you take this medication. Talk to your care team if you or your partner wish to become pregnant or think either of you might be pregnant. This medication can cause serious birth defects if taken during pregnancy or for  up to 6 months after the last dose. A negative pregnancy test is required before starting this medication. A reliable form of contraception is recommended while taking this medication and for 6 months after the last dose. Talk to your care team about reliable forms of contraception. Wear a condom while taking this medication and for at least 3 months after the last dose. Do not breast-feed while taking this medication or for at least 1 week after the last dose. This medication may cause infertility. Talk to your care team if you are concerned about your fertility. What side effects may I notice from receiving this medication? Side effects that you should report to your care team as soon as possible: Allergic reactions--skin rash, itching, hives, swelling of the face, lips, tongue, or throat Infection--fever, chills, cough, sore throat, wounds that don't heal, pain or trouble when passing urine, general feeling of discomfort or being unwell Infusion reactions--chest pain, shortness of breath or trouble breathing, feeling faint or lightheaded Liver injury--right upper belly pain, loss of appetite, nausea, light-colored stool, dark yellow or brown urine, yellowing skin or eyes, unusual weakness or fatigue Low red blood cell level--unusual weakness or fatigue, dizziness, headache, trouble breathing Painful swelling, warmth, or redness of the skin, blisters or sores at the infusion site Rash, fever, and swollen lymph nodes Redness, blistering, peeling, or loosening of the skin, including inside the mouth Tumor lysis syndrome (TLS)--nausea, vomiting, diarrhea, decrease in the amount of urine, dark urine, unusual weakness or fatigue, confusion, muscle pain or cramps, fast or irregular heartbeat, joint pain Unusual bruising or bleeding Side effects that usually do not require medical attention (report to your care team if they continue or are bothersome): Diarrhea Fatigue Headache Loss of  appetite Nausea Vomiting This list may not describe all possible side effects. Call your doctor for medical advice about side effects. You may report side effects to FDA at 1-800-FDA-1088. Where should I keep my medication? This medication is given in a hospital or clinic. It will not be stored at home. NOTE: This sheet is a summary. It may not cover all possible information. If you have questions about this medicine, talk to your doctor, pharmacist, or health care provider.  2024 Elsevier/Gold Standard (2021-06-28 00:00:00)

## 2023-08-03 ENCOUNTER — Encounter: Payer: Self-pay | Admitting: Oncology

## 2023-08-07 ENCOUNTER — Other Ambulatory Visit (HOSPITAL_BASED_OUTPATIENT_CLINIC_OR_DEPARTMENT_OTHER): Payer: Self-pay

## 2023-08-07 MED ORDER — SERTRALINE HCL 50 MG PO TABS
50.0000 mg | ORAL_TABLET | Freq: Every day | ORAL | 3 refills | Status: DC
  Filled 2023-08-07: qty 90, 90d supply, fill #0
  Filled 2023-10-31 (×2): qty 90, 90d supply, fill #1
  Filled 2024-01-29 – 2024-02-12 (×2): qty 90, 90d supply, fill #2

## 2023-08-07 MED ORDER — COLCHICINE 0.6 MG PO CAPS
0.6000 mg | ORAL_CAPSULE | Freq: Every day | ORAL | 3 refills | Status: DC
  Filled 2023-08-07: qty 90, 90d supply, fill #0

## 2023-08-07 MED ORDER — LOSARTAN POTASSIUM 25 MG PO TABS
25.0000 mg | ORAL_TABLET | Freq: Every day | ORAL | 3 refills | Status: DC
  Filled 2023-08-07: qty 90, 90d supply, fill #0
  Filled 2023-10-31: qty 90, 90d supply, fill #1
  Filled 2024-01-29 – 2024-02-12 (×2): qty 90, 90d supply, fill #2

## 2023-08-08 ENCOUNTER — Other Ambulatory Visit (HOSPITAL_BASED_OUTPATIENT_CLINIC_OR_DEPARTMENT_OTHER): Payer: Self-pay

## 2023-08-08 ENCOUNTER — Encounter: Payer: Self-pay | Admitting: Oncology

## 2023-08-08 MED ORDER — COLCHICINE 0.6 MG PO TABS
0.6000 mg | ORAL_TABLET | Freq: Every day | ORAL | 3 refills | Status: DC
  Filled 2023-08-08: qty 90, 90d supply, fill #0

## 2023-08-09 NOTE — Progress Notes (Signed)
 Drexel Gentles RN with [facility] called concerning patient's BP of 168/108, currently on Losartan  25mg  daily.    Per Dr. Harles Lied, they need to speak with PCP Dr. Eluterio Hamburg.  Drexel Gentles RN aware

## 2023-08-22 ENCOUNTER — Encounter: Payer: Self-pay | Admitting: Oncology

## 2023-08-27 ENCOUNTER — Ambulatory Visit (HOSPITAL_BASED_OUTPATIENT_CLINIC_OR_DEPARTMENT_OTHER)
Admission: RE | Admit: 2023-08-27 | Discharge: 2023-08-27 | Disposition: A | Discharge status: Home / Self Care | Source: Ambulatory Visit | Attending: Oncology | Admitting: Oncology

## 2023-08-27 ENCOUNTER — Other Ambulatory Visit (HOSPITAL_BASED_OUTPATIENT_CLINIC_OR_DEPARTMENT_OTHER): Admitting: Radiology

## 2023-08-27 DIAGNOSIS — C8599 Non-Hodgkin lymphoma, unspecified, extranodal and solid organ sites: Secondary | ICD-10-CM

## 2023-08-27 MED ORDER — FLUDEOXYGLUCOSE F - 18 (FDG) INJECTION
10.0000 | Freq: Once | INTRAVENOUS | Status: AC | PRN
  Administered 2023-08-27: 8.66 via INTRAVENOUS

## 2023-08-27 NOTE — Progress Notes (Unsigned)
 [facility]  [address] [place],  [place]  [address] [phone]  Clinic Day:  08/28/2023  Referring physician: Deloria Fetch, MD   HISTORY OF PRESENT ILLNESS:  The patient is an 83 y.o. male  with high-grade B cell lymphoma, who comes in today to go over his PET scan to ascertain his new disease baseline after receiving 3 cycles of bendamustine /Rituxan .  The patient continues to tolerate this chemotherapy regimen very well.  His family is also pleased with how well he is tolerating it.  They are also pleased as no new skin lesions or lymphadenopathy has developed since Bendamustine /Rituxan  started.  In fact, there are only scarred remnants where his previously elevated, violaceous skin lesions were which highlighted his high-grade lymphoma.  PHYSICAL EXAM:  Blood pressure 100/65, pulse 64, temperature 97.8 F (36.6 C), temperature source Oral, resp. rate 12, height 6' (1.829 m), weight 166 lb (75.3 kg), SpO2 93%. Wt Readings from Last 3 Encounters:  08/28/23 166 lb (75.3 kg)  07/24/23 166 lb 6.4 oz (75.5 kg)  06/13/23 180 lb 3.2 oz (81.7 kg)   Body mass index is 22.51 kg/m. Performance status (ECOG): 4 - Bedbound Physical Exam Constitutional:      Appearance: Normal appearance. He is not ill-appearing.     Comments: A pleasant older gentleman who is in a wheelchair.  He looks better vs previous visits.    HENT:     Mouth/Throat:     Mouth: Mucous membranes are moist.     Pharynx: Oropharynx is clear. No oropharyngeal exudate or posterior oropharyngeal erythema.  Cardiovascular:     Rate and Rhythm: Normal rate and regular rhythm.     Heart sounds: No murmur heard.    No friction rub. No gallop.  Pulmonary:     Effort: Pulmonary effort is normal. No respiratory distress.     Breath sounds: Normal breath sounds. No wheezing, rhonchi or rales.  Abdominal:     General: Bowel sounds are normal. There is no distension.     Palpations:  Abdomen is soft. There is no mass.     Tenderness: There is no abdominal tenderness.  Musculoskeletal:        General: No swelling.     Right upper arm: No swelling.     Right forearm: No edema.     Right lower leg: No edema.     Left lower leg: No edema.  Lymphadenopathy:     Cervical: No cervical adenopathy.     Upper Body:     Right upper body: No supraclavicular or axillary adenopathy.     Left upper body: No supraclavicular or axillary adenopathy.     Lower Body: No right inguinal adenopathy. No left inguinal adenopathy.  Skin:    General: Skin is warm.     Coloration: Skin is not jaundiced.     Findings: No lesion or rash.     Comments: Very faint remnants of his previously large skin lesions were present  Neurological:     General: No focal deficit present.     Mental Status: He is alert and oriented to person, place, and time. Mental status is at baseline.  Psychiatric:        Mood and Affect: Mood normal.        Behavior: Behavior normal.        Thought Content: Thought content normal.   SCANS:  His PET scan done on 08-27-23 revealed the following:  FINDINGS: Mediastinal  blood pool activity: SUV max 2.4   Liver activity: SUV max 4.1   NECK:   No hypermetabolic cervical lymph nodes are identified.Fairly symmetric activity within the lymphoid tissue of Waldeyer's ring is within physiologic limits. No suspicious activity identified within the pharyngeal mucosal space.   Incidental CT findings: Bilateral carotid atherosclerosis.   CHEST:   There are no hypermetabolic mediastinal, hilar or axillary lymph nodes. No hypermetabolic pulmonary activity or suspicious nodularity.   Incidental CT findings: Mildly improved aeration of both lung bases with mild residual dependent atelectasis. Right subclavian Port-A-Cath extends to the superior cavoatrial junction. Atherosclerosis of the aorta, great vessels and coronary arteries.   ABDOMEN/PELVIS:   There is no  hypermetabolic activity within the liver, adrenal glands, spleen or pancreas. There is no hypermetabolic nodal activity in the abdomen or pelvis. The previously demonstrated prominent right inguinal lymph node has decreased in size and demonstrates no residual hypermetabolic activity (SUV max 1.6). Bowel activity within physiologic limits.   Incidental CT findings: Aortic and branch vessel atherosclerosis. No ascites.   SKELETON:   There is no hypermetabolic activity to suggest osseous metastatic disease. The previously demonstrated extensive hypermetabolic cutaneous and subcutaneous nodularity has resolved.   Incidental CT findings: Mild degenerative activity within the spine and sternoclavicular joints. Previous right total hip arthroplasty.   IMPRESSION: 1. No evidence of recurrent lymphoma. The previously demonstrated extensive hypermetabolic cutaneous and subcutaneous nodularity has resolved. Deauville 1. 2. The previously demonstrated prominent right inguinal lymph node has decreased in size and demonstrates no residual hypermetabolic activity. 3. No hypermetabolic activity within the parenchymal organs or bone marrow. 4.  Aortic Atherosclerosis (ICD10-I70.0).  PATHOLOGY:   LABS:      Latest Ref Rng & Units 08/28/2023    9:57 AM 07/23/2023    2:09 PM 06/12/2023   11:00 AM  CBC  WBC 4.0 - 10.5 K/uL 8.3  10.9  13.1   Hemoglobin 13.0 - 17.0 g/dL 16.1  09.6  04.5   Hematocrit 39.0 - 52.0 % 40.8  43.6  44.3   Platelets 150 - 400 K/uL 175  277  278       Latest Ref Rng & Units 08/28/2023    9:57 AM 07/23/2023    2:09 PM 06/12/2023   11:00 AM  CMP  Glucose 70 - 99 mg/dL 409  93  811   BUN 8 - 23 mg/dL 22  14  12    Creatinine 0.61 - 1.24 mg/dL 9.14  7.82  9.56   Sodium 135 - 145 mmol/L 141  139  136   Potassium 3.5 - 5.1 mmol/L 3.7  3.8  3.3   Chloride 98 - 111 mmol/L 105  101  99   CO2 22 - 32 mmol/L 25  29  27    Calcium  8.9 - 10.3 mg/dL 21.3  9.8  9.7   Total  Protein 6.5 - 8.1 g/dL 6.1  6.5  6.9   Total Bilirubin 0.0 - 1.2 mg/dL 0.4  0.5  0.5   Alkaline Phos 38 - 126 U/L 60  63  66   AST 15 - 41 U/L 19  17  16    ALT 0 - 44 U/L 17  17  12     ASSESSMENT & PLAN:  An 83 y.o. male with stage IVA diffuse large B lymphoma, ABC subtype.  In clinic today, went over all of his PET scan images with him and his family, for which they could see significant response he has had after  just 3 cycles of Bendamustine /Rituxan .  His PET scan essentially shows he has had a complete response to therapy.  Understandably, the patient and his family were ecstatic with his PET scan images.  Based upon this, he will proceed with his 4th cycle of Bendamustine /Rituxan  today.  Overall, he appears to be tolerating this regimen very well.  I will see this patient back in 4 weeks before he heads into his 5th cycle of Bendamustine /Rituxan . The patient and his family understand all the plans discussed today and are in agreement with them.  Guiselle Mian Felicia Horde, MD

## 2023-08-28 ENCOUNTER — Other Ambulatory Visit: Payer: Self-pay

## 2023-08-28 ENCOUNTER — Inpatient Hospital Stay: Attending: Oncology

## 2023-08-28 ENCOUNTER — Inpatient Hospital Stay: Admitting: Oncology

## 2023-08-28 ENCOUNTER — Inpatient Hospital Stay

## 2023-08-28 VITALS — BP 100/65 | HR 64 | Temp 97.8°F | Resp 12 | Ht 72.0 in | Wt 166.0 lb

## 2023-08-28 VITALS — BP 140/70 | HR 51 | Temp 98.1°F | Resp 16

## 2023-08-28 DIAGNOSIS — C8599 Non-Hodgkin lymphoma, unspecified, extranodal and solid organ sites: Secondary | ICD-10-CM | POA: Diagnosis not present

## 2023-08-28 DIAGNOSIS — Z5112 Encounter for antineoplastic immunotherapy: Secondary | ICD-10-CM | POA: Diagnosis present

## 2023-08-28 DIAGNOSIS — C8510 Unspecified B-cell lymphoma, unspecified site: Secondary | ICD-10-CM | POA: Diagnosis present

## 2023-08-28 DIAGNOSIS — Z5111 Encounter for antineoplastic chemotherapy: Secondary | ICD-10-CM | POA: Diagnosis present

## 2023-08-28 LAB — CMP (CANCER CENTER ONLY)
ALT: 17 U/L (ref 0–44)
AST: 19 U/L (ref 15–41)
Albumin: 4.1 g/dL (ref 3.5–5.0)
Alkaline Phosphatase: 60 U/L (ref 38–126)
Anion gap: 11 (ref 5–15)
BUN: 22 mg/dL (ref 8–23)
CO2: 25 mmol/L (ref 22–32)
Calcium: 10 mg/dL (ref 8.9–10.3)
Chloride: 105 mmol/L (ref 98–111)
Creatinine: 1.01 mg/dL (ref 0.61–1.24)
GFR, Estimated: >60 mL/min (ref >60)
Glucose, Bld: 134 mg/dL — ABNORMAL HIGH (ref 70–99)
Potassium: 3.7 mmol/L (ref 3.5–5.1)
Sodium: 141 mmol/L (ref 135–145)
Total Bilirubin: 0.4 mg/dL (ref 0.0–1.2)
Total Protein: 6.1 g/dL — ABNORMAL LOW (ref 6.5–8.1)

## 2023-08-28 LAB — CBC WITH DIFFERENTIAL (CANCER CENTER ONLY)
Abs Immature Granulocytes: 0.03 10*3/uL (ref 0.00–0.07)
Basophils Absolute: 0.1 10*3/uL (ref 0.0–0.1)
Basophils Relative: 1 %
Eosinophils Absolute: 0.9 10*3/uL — ABNORMAL HIGH (ref 0.0–0.5)
Eosinophils Relative: 10 %
HCT: 40.8 % (ref 39.0–52.0)
Hemoglobin: 13.4 g/dL (ref 13.0–17.0)
Immature Granulocytes: 0 %
Lymphocytes Relative: 5 %
Lymphs Abs: 0.4 10*3/uL — ABNORMAL LOW (ref 0.7–4.0)
MCH: 27.5 pg (ref 26.0–34.0)
MCHC: 32.8 g/dL (ref 30.0–36.0)
MCV: 83.8 fL (ref 80.0–100.0)
Monocytes Absolute: 0.7 10*3/uL (ref 0.1–1.0)
Monocytes Relative: 8 %
Neutro Abs: 6.2 10*3/uL (ref 1.7–7.7)
Neutrophils Relative %: 76 %
Platelet Count: 175 10*3/uL (ref 150–400)
RBC: 4.87 MIL/uL (ref 4.22–5.81)
RDW: 19.3 % — ABNORMAL HIGH (ref 11.5–15.5)
WBC Count: 8.3 10*3/uL (ref 4.0–10.5)
nRBC: 0 % (ref 0.0–0.2)

## 2023-08-28 MED ORDER — HEPARIN SOD (PORK) LOCK FLUSH 100 UNIT/ML IV SOLN
500.0000 [IU] | Freq: Once | INTRAVENOUS | Status: AC | PRN
  Administered 2023-08-28: 500 [IU]

## 2023-08-28 MED ORDER — DEXAMETHASONE SODIUM PHOSPHATE 10 MG/ML IJ SOLN
10.0000 mg | Freq: Once | INTRAMUSCULAR | Status: AC
  Administered 2023-08-28: 10 mg via INTRAVENOUS
  Filled 2023-08-28: qty 1

## 2023-08-28 MED ORDER — PALONOSETRON HCL INJECTION 0.25 MG/5ML
0.2500 mg | Freq: Once | INTRAVENOUS | Status: AC
  Administered 2023-08-28: 0.25 mg via INTRAVENOUS
  Filled 2023-08-28: qty 5

## 2023-08-28 MED ORDER — SODIUM CHLORIDE 0.9 % IV SOLN: INTRAVENOUS | Status: DC

## 2023-08-28 MED ORDER — SODIUM CHLORIDE 0.9% FLUSH
10.0000 mL | INTRAVENOUS | Status: DC | PRN
  Administered 2023-08-28: 10 mL

## 2023-08-28 MED ORDER — DIPHENHYDRAMINE HCL 25 MG PO CAPS
50.0000 mg | ORAL_CAPSULE | Freq: Once | ORAL | Status: AC
  Administered 2023-08-28: 50 mg via ORAL
  Filled 2023-08-28: qty 2

## 2023-08-28 MED ORDER — SODIUM CHLORIDE 0.9 % IV SOLN
180.0000 mg | Freq: Once | INTRAVENOUS | Status: AC
  Administered 2023-08-28: 180 mg via INTRAVENOUS
  Filled 2023-08-28: qty 7.2

## 2023-08-28 MED ORDER — SODIUM CHLORIDE 0.9 % IV SOLN
375.0000 mg/m2 | Freq: Once | INTRAVENOUS | Status: AC
  Administered 2023-08-28: 800 mg via INTRAVENOUS
  Filled 2023-08-28: qty 50

## 2023-08-28 MED ORDER — ACETAMINOPHEN 325 MG PO TABS
650.0000 mg | ORAL_TABLET | Freq: Once | ORAL | Status: AC
  Administered 2023-08-28: 650 mg via ORAL
  Filled 2023-08-28: qty 2

## 2023-08-28 NOTE — Patient Instructions (Signed)
Bendamustine Injection What is this medication? BENDAMUSTINE (BEN da MUS teen) treats leukemia and lymphoma. It works by slowing down the growth of cancer cells. This medicine may be used for other purposes; ask your health care provider or pharmacist if you have questions. COMMON BRAND NAME(S): Marilynne Halsted, VIVIMUSTA What should I tell my care team before I take this medication? They need to know if you have any of these conditions: Infection, especially a viral infection, such as chickenpox, cold sores, herpes Kidney disease Liver disease An unusual or allergic reaction to bendamustine, mannitol, other medications, foods, dyes, or preservatives Pregnant or trying to get pregnant Breast-feeding How should I use this medication? This medication is injected into a vein. It is given by your care team in a hospital or clinic setting. Talk to your care team about the use of this medication in children. Special care may be needed. Overdosage: If you think you have taken too much of this medicine contact a poison control center or emergency room at once. NOTE: This medicine is only for you. Do not share this medicine with others. What if I miss a dose? Keep appointments for follow-up doses. It is important not to miss your dose. Call your care team if you are unable to keep an appointment. What may interact with this medication? Do not take this medication with any of the following: Clozapine This medication may also interact with the following: Atazanavir Cimetidine Ciprofloxacin Enoxacin Fluvoxamine Medications for seizures, such as carbamazepine, phenobarbital Mexiletine Rifampin Tacrine Thiabendazole Zileuton This list may not describe all possible interactions. Give your health care provider a list of all the medicines, herbs, non-prescription drugs, or dietary supplements you use. Also tell them if you smoke, drink alcohol, or use illegal drugs. Some items may  interact with your medicine. What should I watch for while using this medication? Visit your care team for regular checks on your progress. This medication may make you feel generally unwell. This is not uncommon, as chemotherapy can affect healthy cells as well as cancer cells. Report any side effects. Continue your course of treatment even though you feel ill unless your care team tells you to stop. You may need blood work while taking this medication. This medication may increase your risk of getting an infection. Call your care team for advice if you get a fever, chills, sore throat, or other symptoms of a cold or flu. Do not treat yourself. Try to avoid being around people who are sick. This medication may cause serious skin reactions. They can happen weeks to months after starting the medication. Contact your care team right away if you notice fevers or flu-like symptoms with a rash. The rash may be red or purple and then turn into blisters or peeling of the skin. You may also notice a red rash with swelling of the face, lips, or lymph nodes in your neck or under your arms. In some patients, this medication may cause a serious brain infection that may cause death. If you have any problems seeing, thinking, speaking, walking, or standing, tell your care team right away. If you cannot reach your care team, urgently seek other source of medical care. This medication may increase your risk to bruise or bleed. Call your care team if you notice any unusual bleeding. Talk to your care team about your risk of cancer. You may be more at risk for certain types of cancer if you take this medication. Talk to your care team about your  risk of skin cancer. You may be more at risk for skin cancer if you take this medication. Talk to your care team if you or your partner wish to become pregnant or think either of you might be pregnant. This medication can cause serious birth defects if taken during pregnancy or for  up to 6 months after the last dose. A negative pregnancy test is required before starting this medication. A reliable form of contraception is recommended while taking this medication and for 6 months after the last dose. Talk to your care team about reliable forms of contraception. Wear a condom while taking this medication and for at least 3 months after the last dose. Do not breast-feed while taking this medication or for at least 1 week after the last dose. This medication may cause infertility. Talk to your care team if you are concerned about your fertility. What side effects may I notice from receiving this medication? Side effects that you should report to your care team as soon as possible: Allergic reactions--skin rash, itching, hives, swelling of the face, lips, tongue, or throat Infection--fever, chills, cough, sore throat, wounds that don't heal, pain or trouble when passing urine, general feeling of discomfort or being unwell Infusion reactions--chest pain, shortness of breath or trouble breathing, feeling faint or lightheaded Liver injury--right upper belly pain, loss of appetite, nausea, light-colored stool, dark yellow or brown urine, yellowing skin or eyes, unusual weakness or fatigue Low red blood cell level--unusual weakness or fatigue, dizziness, headache, trouble breathing Painful swelling, warmth, or redness of the skin, blisters or sores at the infusion site Rash, fever, and swollen lymph nodes Redness, blistering, peeling, or loosening of the skin, including inside the mouth Tumor lysis syndrome (TLS)--nausea, vomiting, diarrhea, decrease in the amount of urine, dark urine, unusual weakness or fatigue, confusion, muscle pain or cramps, fast or irregular heartbeat, joint pain Unusual bruising or bleeding Side effects that usually do not require medical attention (report to your care team if they continue or are bothersome): Diarrhea Fatigue Headache Loss of  appetite Nausea Vomiting This list may not describe all possible side effects. Call your doctor for medical advice about side effects. You may report side effects to FDA at 1-800-FDA-1088. Where should I keep my medication? This medication is given in a hospital or clinic. It will not be stored at home. NOTE: This sheet is a summary. It may not cover all possible information. If you have questions about this medicine, talk to your doctor, pharmacist, or health care provider.  2024 Elsevier/Gold Standard (2021-06-28 00:00:00)  Rituximab Injection What is this medication? RITUXIMAB (ri TUX i mab) treats leukemia and lymphoma. It works by blocking a protein that causes cancer cells to grow and multiply. This helps to slow or stop the spread of cancer cells. It may also be used to treat autoimmune conditions, such as arthritis. It works by slowing down an overactive immune system. It is a monoclonal antibody. This medicine may be used for other purposes; ask your health care provider or pharmacist if you have questions. COMMON BRAND NAME(S): RIABNI, Rituxan, RUXIENCE, truxima What should I tell my care team before I take this medication? They need to know if you have any of these conditions: Chest pain Heart disease Immune system problems Infection, such as chickenpox, cold sores, hepatitis B, herpes Irregular heartbeat or rhythm Kidney disease Low blood counts, such as low white cells, platelets, red cells Lung disease Recent or upcoming vaccine An unusual or allergic reaction to  rituximab, other medications, foods, dyes, or preservatives Pregnant or trying to get pregnant Breast-feeding How should I use this medication? This medication is injected into a vein. It is given by a care team in a hospital or clinic setting. A special MedGuide will be given to you before each treatment. Be sure to read this information carefully each time. Talk to your care team about the use of this  medication in children. While this medication may be prescribed for children as young as 6 months for selected conditions, precautions do apply. Overdosage: If you think you have taken too much of this medicine contact a poison control center or emergency room at once. NOTE: This medicine is only for you. Do not share this medicine with others. What if I miss a dose? Keep appointments for follow-up doses. It is important not to miss your dose. Call your care team if you are unable to keep an appointment. What may interact with this medication? Do not take this medication with any of the following: Live vaccines This medication may also interact with the following: Cisplatin This list may not describe all possible interactions. Give your health care provider a list of all the medicines, herbs, non-prescription drugs, or dietary supplements you use. Also tell them if you smoke, drink alcohol, or use illegal drugs. Some items may interact with your medicine. What should I watch for while using this medication? Your condition will be monitored carefully while you are receiving this medication. You may need blood work while taking this medication. This medication can cause serious infusion reactions. To reduce the risk your care team may give you other medications to take before receiving this one. Be sure to follow the directions from your care team. This medication may increase your risk of getting an infection. Call your care team for advice if you get a fever, chills, sore throat, or other symptoms of a cold or flu. Do not treat yourself. Try to avoid being around people who are sick. Call your care team if you are around anyone with measles, chickenpox, or if you develop sores or blisters that do not heal properly. Avoid taking medications that contain aspirin, acetaminophen, ibuprofen, naproxen, or ketoprofen unless instructed by your care team. These medications may hide a fever. This medication  may cause serious skin reactions. They can happen weeks to months after starting the medication. Contact your care team right away if you notice fevers or flu-like symptoms with a rash. The rash may be red or purple and then turn into blisters or peeling of the skin. You may also notice a red rash with swelling of the face, lips, or lymph nodes in your neck or under your arms. In some patients, this medication may cause a serious brain infection that may cause death. If you have any problems seeing, thinking, speaking, walking, or standing, tell your care team right away. If you cannot reach your care team, urgently seek another source of medical care. Talk to your care team if you may be pregnant. Serious birth defects can occur if you take this medication during pregnancy and for 12 months after the last dose. You will need a negative pregnancy test before starting this medication. Contraception is recommended while taking this medication and for 12 months after the last dose. Your care team can help you find the option that works for you. Do not breastfeed while taking this medication and for at least 6 months after the last dose. What side effects may  I notice from receiving this medication? Side effects that you should report to your care team as soon as possible: Allergic reactions or angioedema--skin rash, itching or hives, swelling of the face, eyes, lips, tongue, arms, or legs, trouble swallowing or breathing Bowel blockage--stomach cramping, unable to have a bowel movement or pass gas, loss of appetite, vomiting Dizziness, loss of balance or coordination, confusion or trouble speaking Heart attack--pain or tightness in the chest, shoulders, arms, or jaw, nausea, shortness of breath, cold or clammy skin, feeling faint or lightheaded Heart rhythm changes--fast or irregular heartbeat, dizziness, feeling faint or lightheaded, chest pain, trouble breathing Infection--fever, chills, cough, sore  throat, wounds that don't heal, pain or trouble when passing urine, general feeling of discomfort or being unwell Infusion reactions--chest pain, shortness of breath or trouble breathing, feeling faint or lightheaded Kidney injury--decrease in the amount of urine, swelling of the ankles, hands, or feet Liver injury--right upper belly pain, loss of appetite, nausea, light-colored stool, dark yellow or brown urine, yellowing skin or eyes, unusual weakness or fatigue Redness, blistering, peeling, or loosening of the skin, including inside the mouth Stomach pain that is severe, does not go away, or gets worse Tumor lysis syndrome (TLS)--nausea, vomiting, diarrhea, decrease in the amount of urine, dark urine, unusual weakness or fatigue, confusion, muscle pain or cramps, fast or irregular heartbeat, joint pain Side effects that usually do not require medical attention (report to your care team if they continue or are bothersome): Headache Joint pain Nausea Runny or stuffy nose Unusual weakness or fatigue This list may not describe all possible side effects. Call your doctor for medical advice about side effects. You may report side effects to FDA at 1-800-FDA-1088. Where should I keep my medication? This medication is given in a hospital or clinic. It will not be stored at home. NOTE: This sheet is a summary. It may not cover all possible information. If you have questions about this medicine, talk to your doctor, pharmacist, or health care provider.  2024 Elsevier/Gold Standard (2021-07-28 00:00:00)

## 2023-08-29 ENCOUNTER — Inpatient Hospital Stay

## 2023-08-29 VITALS — BP 126/70 | HR 56 | Temp 98.0°F | Resp 18

## 2023-08-29 DIAGNOSIS — Z5112 Encounter for antineoplastic immunotherapy: Secondary | ICD-10-CM | POA: Diagnosis not present

## 2023-08-29 DIAGNOSIS — C8599 Non-Hodgkin lymphoma, unspecified, extranodal and solid organ sites: Secondary | ICD-10-CM

## 2023-08-29 MED ORDER — HEPARIN SOD (PORK) LOCK FLUSH 100 UNIT/ML IV SOLN
500.0000 [IU] | Freq: Once | INTRAVENOUS | Status: AC | PRN
  Administered 2023-08-29: 500 [IU]

## 2023-08-29 MED ORDER — DEXAMETHASONE SODIUM PHOSPHATE 10 MG/ML IJ SOLN
10.0000 mg | Freq: Once | INTRAMUSCULAR | Status: AC
  Administered 2023-08-29: 10 mg via INTRAVENOUS
  Filled 2023-08-29: qty 1

## 2023-08-29 MED ORDER — SODIUM CHLORIDE 0.9 % IV SOLN: INTRAVENOUS | Status: DC

## 2023-08-29 MED ORDER — SODIUM CHLORIDE 0.9 % IV SOLN
180.0000 mg | Freq: Once | INTRAVENOUS | Status: AC
  Administered 2023-08-29: 180 mg via INTRAVENOUS
  Filled 2023-08-29: qty 7.2

## 2023-08-29 MED ORDER — SODIUM CHLORIDE 0.9% FLUSH
10.0000 mL | INTRAVENOUS | Status: DC | PRN
  Administered 2023-08-29: 10 mL

## 2023-08-29 NOTE — Patient Instructions (Signed)
Bendamustine Injection What is this medication? BENDAMUSTINE (BEN da MUS teen) treats leukemia and lymphoma. It works by slowing down the growth of cancer cells. This medicine may be used for other purposes; ask your health care provider or pharmacist if you have questions. COMMON BRAND NAME(S): Marilynne Halsted, VIVIMUSTA What should I tell my care team before I take this medication? They need to know if you have any of these conditions: Infection, especially a viral infection, such as chickenpox, cold sores, herpes Kidney disease Liver disease An unusual or allergic reaction to bendamustine, mannitol, other medications, foods, dyes, or preservatives Pregnant or trying to get pregnant Breast-feeding How should I use this medication? This medication is injected into a vein. It is given by your care team in a hospital or clinic setting. Talk to your care team about the use of this medication in children. Special care may be needed. Overdosage: If you think you have taken too much of this medicine contact a poison control center or emergency room at once. NOTE: This medicine is only for you. Do not share this medicine with others. What if I miss a dose? Keep appointments for follow-up doses. It is important not to miss your dose. Call your care team if you are unable to keep an appointment. What may interact with this medication? Do not take this medication with any of the following: Clozapine This medication may also interact with the following: Atazanavir Cimetidine Ciprofloxacin Enoxacin Fluvoxamine Medications for seizures, such as carbamazepine, phenobarbital Mexiletine Rifampin Tacrine Thiabendazole Zileuton This list may not describe all possible interactions. Give your health care provider a list of all the medicines, herbs, non-prescription drugs, or dietary supplements you use. Also tell them if you smoke, drink alcohol, or use illegal drugs. Some items may  interact with your medicine. What should I watch for while using this medication? Visit your care team for regular checks on your progress. This medication may make you feel generally unwell. This is not uncommon, as chemotherapy can affect healthy cells as well as cancer cells. Report any side effects. Continue your course of treatment even though you feel ill unless your care team tells you to stop. You may need blood work while taking this medication. This medication may increase your risk of getting an infection. Call your care team for advice if you get a fever, chills, sore throat, or other symptoms of a cold or flu. Do not treat yourself. Try to avoid being around people who are sick. This medication may cause serious skin reactions. They can happen weeks to months after starting the medication. Contact your care team right away if you notice fevers or flu-like symptoms with a rash. The rash may be red or purple and then turn into blisters or peeling of the skin. You may also notice a red rash with swelling of the face, lips, or lymph nodes in your neck or under your arms. In some patients, this medication may cause a serious brain infection that may cause death. If you have any problems seeing, thinking, speaking, walking, or standing, tell your care team right away. If you cannot reach your care team, urgently seek other source of medical care. This medication may increase your risk to bruise or bleed. Call your care team if you notice any unusual bleeding. Talk to your care team about your risk of cancer. You may be more at risk for certain types of cancer if you take this medication. Talk to your care team about your  risk of skin cancer. You may be more at risk for skin cancer if you take this medication. Talk to your care team if you or your partner wish to become pregnant or think either of you might be pregnant. This medication can cause serious birth defects if taken during pregnancy or for  up to 6 months after the last dose. A negative pregnancy test is required before starting this medication. A reliable form of contraception is recommended while taking this medication and for 6 months after the last dose. Talk to your care team about reliable forms of contraception. Wear a condom while taking this medication and for at least 3 months after the last dose. Do not breast-feed while taking this medication or for at least 1 week after the last dose. This medication may cause infertility. Talk to your care team if you are concerned about your fertility. What side effects may I notice from receiving this medication? Side effects that you should report to your care team as soon as possible: Allergic reactions--skin rash, itching, hives, swelling of the face, lips, tongue, or throat Infection--fever, chills, cough, sore throat, wounds that don't heal, pain or trouble when passing urine, general feeling of discomfort or being unwell Infusion reactions--chest pain, shortness of breath or trouble breathing, feeling faint or lightheaded Liver injury--right upper belly pain, loss of appetite, nausea, light-colored stool, dark yellow or brown urine, yellowing skin or eyes, unusual weakness or fatigue Low red blood cell level--unusual weakness or fatigue, dizziness, headache, trouble breathing Painful swelling, warmth, or redness of the skin, blisters or sores at the infusion site Rash, fever, and swollen lymph nodes Redness, blistering, peeling, or loosening of the skin, including inside the mouth Tumor lysis syndrome (TLS)--nausea, vomiting, diarrhea, decrease in the amount of urine, dark urine, unusual weakness or fatigue, confusion, muscle pain or cramps, fast or irregular heartbeat, joint pain Unusual bruising or bleeding Side effects that usually do not require medical attention (report to your care team if they continue or are bothersome): Diarrhea Fatigue Headache Loss of  appetite Nausea Vomiting This list may not describe all possible side effects. Call your doctor for medical advice about side effects. You may report side effects to FDA at 1-800-FDA-1088. Where should I keep my medication? This medication is given in a hospital or clinic. It will not be stored at home. NOTE: This sheet is a summary. It may not cover all possible information. If you have questions about this medicine, talk to your doctor, pharmacist, or health care provider.  2024 Elsevier/Gold Standard (2021-06-28 00:00:00)

## 2023-09-13 ENCOUNTER — Telehealth: Payer: Self-pay | Admitting: Adult Health

## 2023-09-13 NOTE — Telephone Encounter (Signed)
 LVM and sent mychart msg informing pt of need to reschedule 10/15/23 appt - NP out

## 2023-09-14 ENCOUNTER — Other Ambulatory Visit (HOSPITAL_BASED_OUTPATIENT_CLINIC_OR_DEPARTMENT_OTHER): Payer: Self-pay

## 2023-09-24 ENCOUNTER — Other Ambulatory Visit (HOSPITAL_BASED_OUTPATIENT_CLINIC_OR_DEPARTMENT_OTHER): Payer: Self-pay

## 2023-09-24 NOTE — Progress Notes (Unsigned)
 [facility]  [address] [place],  [place]  [address] [phone]  Clinic Day:  09/25/2023  Referring physician: Thurmond Cathlyn LABOR., MD   HISTORY OF PRESENT ILLNESS:  The patient is an 83 y.o. male  with high-grade B cell lymphoma, who comes in today to be evaluated before heading into his 5th cycle of  bendamustine /Rituxan .  The patient continues to tolerate this chemotherapy regimen very well.  His family is also pleased with how well he is tolerating it.  They are also pleased as no new skin lesions or lymphadenopathy has developed since his bendamustine /Rituxan  started.  He denies having any new symptoms or findings which concern him for disease progression.  His family's only concern is that he has developed a decubitus ulcer, which is undoubtedly related to the prolonged sitting in his wheelchair.  This is currently being treated.  PHYSICAL EXAM:  Blood pressure 125/73, pulse 69, temperature 98.2 F (36.8 C), temperature source Oral, resp. rate 20, height 6' (1.829 m), SpO2 94%. Wt Readings from Last 3 Encounters:  08/28/23 166 lb (75.3 kg)  07/24/23 166 lb 6.4 oz (75.5 kg)  06/13/23 180 lb 3.2 oz (81.7 kg)   Body mass index is 22.51 kg/m. Performance status (ECOG): 4 - Bedbound Physical Exam Constitutional:      Appearance: Normal appearance. He is not ill-appearing.     Comments: A pleasant older gentleman who is in a wheelchair.    HENT:     Mouth/Throat:     Mouth: Mucous membranes are moist.     Pharynx: Oropharynx is clear. No oropharyngeal exudate or posterior oropharyngeal erythema.  Cardiovascular:     Rate and Rhythm: Normal rate and regular rhythm.     Heart sounds: No murmur heard.    No friction rub. No gallop.  Pulmonary:     Effort: Pulmonary effort is normal. No respiratory distress.     Breath sounds: Normal breath sounds. No wheezing, rhonchi or rales.  Abdominal:     General: Bowel sounds are normal. There is no  distension.     Palpations: Abdomen is soft. There is no mass.     Tenderness: There is no abdominal tenderness.  Musculoskeletal:        General: No swelling.     Right upper arm: No swelling.     Right forearm: No edema.     Right lower leg: No edema.     Left lower leg: No edema.  Lymphadenopathy:     Cervical: No cervical adenopathy.     Upper Body:     Right upper body: No supraclavicular or axillary adenopathy.     Left upper body: No supraclavicular or axillary adenopathy.     Lower Body: No right inguinal adenopathy. No left inguinal adenopathy.  Skin:    General: Skin is warm.     Coloration: Skin is not jaundiced.     Findings: No lesion or rash.     Comments: Very faint remnants of his previously large skin lesions were present  Neurological:     General: No focal deficit present.     Mental Status: He is alert and oriented to person, place, and time. Mental status is at baseline.  Psychiatric:        Mood and Affect: Mood normal.        Behavior: Behavior normal.        Thought Content: Thought content normal.    PATHOLOGY:   LABS:  Latest Ref Rng & Units 09/25/2023    9:13 AM 08/28/2023    9:57 AM 07/23/2023    2:09 PM  CBC  WBC 4.0 - 10.5 K/uL 13.9  8.3  10.9   Hemoglobin 13.0 - 17.0 g/dL 86.7  86.5  85.8   Hematocrit 39.0 - 52.0 % 39.4  40.8  43.6   Platelets 150 - 400 K/uL 187  175  277       Latest Ref Rng & Units 09/25/2023    9:13 AM 08/28/2023    9:57 AM 07/23/2023    2:09 PM  CMP  Glucose 70 - 99 mg/dL 848  865  93   BUN 8 - 23 mg/dL 14  22  14    Creatinine 0.61 - 1.24 mg/dL 9.04  8.98  9.15   Sodium 135 - 145 mmol/L 137  141  139   Potassium 3.5 - 5.1 mmol/L 3.8  3.7  3.8   Chloride 98 - 111 mmol/L 99  105  101   CO2 22 - 32 mmol/L 26  25  29    Calcium  8.9 - 10.3 mg/dL 9.8  89.9  9.8   Total Protein 6.5 - 8.1 g/dL 6.5  6.1  6.5   Total Bilirubin 0.0 - 1.2 mg/dL 0.8  0.4  0.5   Alkaline Phos 38 - 126 U/L 74  60  63   AST 15 - 41 U/L 19  19   17    ALT 0 - 44 U/L 10  17  17     ASSESSMENT & PLAN:  An 83 y.o. male with stage IVA diffuse large B lymphoma, ABC subtype.  He will proceed with his 5th cycle of Bendamustine /Rituxan  today.  Overall, he appears to be tolerating this regimen very well.  I will see this patient back in 4 weeks before he heads into his 6th and final cycle of Bendamustine /Rituxan . The patient and his family understand all the plans discussed today and are in agreement with them.  Aloma Boch DELENA Kerns, MD

## 2023-09-25 ENCOUNTER — Inpatient Hospital Stay: Attending: Oncology

## 2023-09-25 ENCOUNTER — Inpatient Hospital Stay: Admitting: Oncology

## 2023-09-25 ENCOUNTER — Other Ambulatory Visit: Payer: Self-pay

## 2023-09-25 ENCOUNTER — Inpatient Hospital Stay

## 2023-09-25 VITALS — BP 125/73 | HR 69 | Temp 98.2°F | Resp 20 | Ht 72.0 in

## 2023-09-25 VITALS — BP 122/72 | HR 70 | Temp 98.0°F | Resp 18

## 2023-09-25 DIAGNOSIS — C8510 Unspecified B-cell lymphoma, unspecified site: Secondary | ICD-10-CM | POA: Insufficient documentation

## 2023-09-25 DIAGNOSIS — C8599 Non-Hodgkin lymphoma, unspecified, extranodal and solid organ sites: Secondary | ICD-10-CM

## 2023-09-25 DIAGNOSIS — Z5111 Encounter for antineoplastic chemotherapy: Secondary | ICD-10-CM | POA: Insufficient documentation

## 2023-09-25 DIAGNOSIS — Z5112 Encounter for antineoplastic immunotherapy: Secondary | ICD-10-CM | POA: Insufficient documentation

## 2023-09-25 LAB — CBC WITH DIFFERENTIAL (CANCER CENTER ONLY)
Abs Immature Granulocytes: 0.42 K/uL — ABNORMAL HIGH (ref 0.00–0.07)
Basophils Absolute: 0.1 K/uL (ref 0.0–0.1)
Basophils Relative: 1 %
Eosinophils Absolute: 0.4 K/uL (ref 0.0–0.5)
Eosinophils Relative: 3 %
HCT: 39.4 % (ref 39.0–52.0)
Hemoglobin: 13.2 g/dL (ref 13.0–17.0)
Immature Granulocytes: 3 %
Lymphocytes Relative: 1 %
Lymphs Abs: 0.2 K/uL — ABNORMAL LOW (ref 0.7–4.0)
MCH: 28.8 pg (ref 26.0–34.0)
MCHC: 33.5 g/dL (ref 30.0–36.0)
MCV: 86 fL (ref 80.0–100.0)
Monocytes Absolute: 1.1 K/uL — ABNORMAL HIGH (ref 0.1–1.0)
Monocytes Relative: 8 %
Neutro Abs: 11.8 K/uL — ABNORMAL HIGH (ref 1.7–7.7)
Neutrophils Relative %: 84 %
Platelet Count: 187 K/uL (ref 150–400)
RBC: 4.58 MIL/uL (ref 4.22–5.81)
RDW: 15.9 % — ABNORMAL HIGH (ref 11.5–15.5)
WBC Count: 13.9 K/uL — ABNORMAL HIGH (ref 4.0–10.5)
nRBC: 0 % (ref 0.0–0.2)

## 2023-09-25 LAB — CMP (CANCER CENTER ONLY)
ALT: 10 U/L (ref 0–44)
AST: 19 U/L (ref 15–41)
Albumin: 3.7 g/dL (ref 3.5–5.0)
Alkaline Phosphatase: 74 U/L (ref 38–126)
Anion gap: 12 (ref 5–15)
BUN: 14 mg/dL (ref 8–23)
CO2: 26 mmol/L (ref 22–32)
Calcium: 9.8 mg/dL (ref 8.9–10.3)
Chloride: 99 mmol/L (ref 98–111)
Creatinine: 0.95 mg/dL (ref 0.61–1.24)
GFR, Estimated: >60 mL/min (ref >60)
Glucose, Bld: 151 mg/dL — ABNORMAL HIGH (ref 70–99)
Potassium: 3.8 mmol/L (ref 3.5–5.1)
Sodium: 137 mmol/L (ref 135–145)
Total Bilirubin: 0.8 mg/dL (ref 0.0–1.2)
Total Protein: 6.5 g/dL (ref 6.5–8.1)

## 2023-09-25 MED ORDER — ACETAMINOPHEN 325 MG PO TABS
650.0000 mg | ORAL_TABLET | Freq: Once | ORAL | Status: AC
Start: 2023-09-25 — End: 2023-09-25
  Administered 2023-09-25: 650 mg via ORAL
  Filled 2023-09-25: qty 2

## 2023-09-25 MED ORDER — DEXAMETHASONE SODIUM PHOSPHATE 10 MG/ML IJ SOLN
10.0000 mg | Freq: Once | INTRAMUSCULAR | Status: AC
  Administered 2023-09-25: 10 mg via INTRAVENOUS
  Filled 2023-09-25: qty 1

## 2023-09-25 MED ORDER — DIPHENHYDRAMINE HCL 25 MG PO CAPS
50.0000 mg | ORAL_CAPSULE | Freq: Once | ORAL | Status: AC
  Administered 2023-09-25: 50 mg via ORAL
  Filled 2023-09-25: qty 2

## 2023-09-25 MED ORDER — SODIUM CHLORIDE 0.9 % IV SOLN
375.0000 mg/m2 | Freq: Once | INTRAVENOUS | Status: AC
  Administered 2023-09-25: 800 mg via INTRAVENOUS
  Filled 2023-09-25: qty 50

## 2023-09-25 MED ORDER — SODIUM CHLORIDE 0.9 % IV SOLN
180.0000 mg | Freq: Once | INTRAVENOUS | Status: AC
  Administered 2023-09-25: 180 mg via INTRAVENOUS
  Filled 2023-09-25: qty 7.2

## 2023-09-25 MED ORDER — HEPARIN SOD (PORK) LOCK FLUSH 100 UNIT/ML IV SOLN: 500.0000 [IU] | Freq: Once | INTRAVENOUS | Status: DC | PRN

## 2023-09-25 MED ORDER — SODIUM CHLORIDE 0.9% FLUSH: 10.0000 mL | INTRAVENOUS | Status: DC | PRN

## 2023-09-25 MED ORDER — PALONOSETRON HCL INJECTION 0.25 MG/5ML
0.2500 mg | Freq: Once | INTRAVENOUS | Status: AC
  Administered 2023-09-25: 0.25 mg via INTRAVENOUS
  Filled 2023-09-25: qty 5

## 2023-09-25 MED ORDER — SODIUM CHLORIDE 0.9 % IV SOLN
INTRAVENOUS | Status: DC
Start: 2023-09-25 — End: 2023-09-25

## 2023-09-25 NOTE — Patient Instructions (Signed)
Bendamustine Injection What is this medication? BENDAMUSTINE (BEN da MUS teen) treats leukemia and lymphoma. It works by slowing down the growth of cancer cells. This medicine may be used for other purposes; ask your health care provider or pharmacist if you have questions. COMMON BRAND NAME(S): Marilynne Halsted, VIVIMUSTA What should I tell my care team before I take this medication? They need to know if you have any of these conditions: Infection, especially a viral infection, such as chickenpox, cold sores, herpes Kidney disease Liver disease An unusual or allergic reaction to bendamustine, mannitol, other medications, foods, dyes, or preservatives Pregnant or trying to get pregnant Breast-feeding How should I use this medication? This medication is injected into a vein. It is given by your care team in a hospital or clinic setting. Talk to your care team about the use of this medication in children. Special care may be needed. Overdosage: If you think you have taken too much of this medicine contact a poison control center or emergency room at once. NOTE: This medicine is only for you. Do not share this medicine with others. What if I miss a dose? Keep appointments for follow-up doses. It is important not to miss your dose. Call your care team if you are unable to keep an appointment. What may interact with this medication? Do not take this medication with any of the following: Clozapine This medication may also interact with the following: Atazanavir Cimetidine Ciprofloxacin Enoxacin Fluvoxamine Medications for seizures, such as carbamazepine, phenobarbital Mexiletine Rifampin Tacrine Thiabendazole Zileuton This list may not describe all possible interactions. Give your health care provider a list of all the medicines, herbs, non-prescription drugs, or dietary supplements you use. Also tell them if you smoke, drink alcohol, or use illegal drugs. Some items may  interact with your medicine. What should I watch for while using this medication? Visit your care team for regular checks on your progress. This medication may make you feel generally unwell. This is not uncommon, as chemotherapy can affect healthy cells as well as cancer cells. Report any side effects. Continue your course of treatment even though you feel ill unless your care team tells you to stop. You may need blood work while taking this medication. This medication may increase your risk of getting an infection. Call your care team for advice if you get a fever, chills, sore throat, or other symptoms of a cold or flu. Do not treat yourself. Try to avoid being around people who are sick. This medication may cause serious skin reactions. They can happen weeks to months after starting the medication. Contact your care team right away if you notice fevers or flu-like symptoms with a rash. The rash may be red or purple and then turn into blisters or peeling of the skin. You may also notice a red rash with swelling of the face, lips, or lymph nodes in your neck or under your arms. In some patients, this medication may cause a serious brain infection that may cause death. If you have any problems seeing, thinking, speaking, walking, or standing, tell your care team right away. If you cannot reach your care team, urgently seek other source of medical care. This medication may increase your risk to bruise or bleed. Call your care team if you notice any unusual bleeding. Talk to your care team about your risk of cancer. You may be more at risk for certain types of cancer if you take this medication. Talk to your care team about your  risk of skin cancer. You may be more at risk for skin cancer if you take this medication. Talk to your care team if you or your partner wish to become pregnant or think either of you might be pregnant. This medication can cause serious birth defects if taken during pregnancy or for  up to 6 months after the last dose. A negative pregnancy test is required before starting this medication. A reliable form of contraception is recommended while taking this medication and for 6 months after the last dose. Talk to your care team about reliable forms of contraception. Wear a condom while taking this medication and for at least 3 months after the last dose. Do not breast-feed while taking this medication or for at least 1 week after the last dose. This medication may cause infertility. Talk to your care team if you are concerned about your fertility. What side effects may I notice from receiving this medication? Side effects that you should report to your care team as soon as possible: Allergic reactions--skin rash, itching, hives, swelling of the face, lips, tongue, or throat Infection--fever, chills, cough, sore throat, wounds that don't heal, pain or trouble when passing urine, general feeling of discomfort or being unwell Infusion reactions--chest pain, shortness of breath or trouble breathing, feeling faint or lightheaded Liver injury--right upper belly pain, loss of appetite, nausea, light-colored stool, dark yellow or brown urine, yellowing skin or eyes, unusual weakness or fatigue Low red blood cell level--unusual weakness or fatigue, dizziness, headache, trouble breathing Painful swelling, warmth, or redness of the skin, blisters or sores at the infusion site Rash, fever, and swollen lymph nodes Redness, blistering, peeling, or loosening of the skin, including inside the mouth Tumor lysis syndrome (TLS)--nausea, vomiting, diarrhea, decrease in the amount of urine, dark urine, unusual weakness or fatigue, confusion, muscle pain or cramps, fast or irregular heartbeat, joint pain Unusual bruising or bleeding Side effects that usually do not require medical attention (report to your care team if they continue or are bothersome): Diarrhea Fatigue Headache Loss of  appetite Nausea Vomiting This list may not describe all possible side effects. Call your doctor for medical advice about side effects. You may report side effects to FDA at 1-800-FDA-1088. Where should I keep my medication? This medication is given in a hospital or clinic. It will not be stored at home. NOTE: This sheet is a summary. It may not cover all possible information. If you have questions about this medicine, talk to your doctor, pharmacist, or health care provider.  2024 Elsevier/Gold Standard (2021-06-28 00:00:00)  Rituximab Injection What is this medication? RITUXIMAB (ri TUX i mab) treats leukemia and lymphoma. It works by blocking a protein that causes cancer cells to grow and multiply. This helps to slow or stop the spread of cancer cells. It may also be used to treat autoimmune conditions, such as arthritis. It works by slowing down an overactive immune system. It is a monoclonal antibody. This medicine may be used for other purposes; ask your health care provider or pharmacist if you have questions. COMMON BRAND NAME(S): RIABNI, Rituxan, RUXIENCE, truxima What should I tell my care team before I take this medication? They need to know if you have any of these conditions: Chest pain Heart disease Immune system problems Infection, such as chickenpox, cold sores, hepatitis B, herpes Irregular heartbeat or rhythm Kidney disease Low blood counts, such as low white cells, platelets, red cells Lung disease Recent or upcoming vaccine An unusual or allergic reaction to  rituximab, other medications, foods, dyes, or preservatives Pregnant or trying to get pregnant Breast-feeding How should I use this medication? This medication is injected into a vein. It is given by a care team in a hospital or clinic setting. A special MedGuide will be given to you before each treatment. Be sure to read this information carefully each time. Talk to your care team about the use of this  medication in children. While this medication may be prescribed for children as young as 6 months for selected conditions, precautions do apply. Overdosage: If you think you have taken too much of this medicine contact a poison control center or emergency room at once. NOTE: This medicine is only for you. Do not share this medicine with others. What if I miss a dose? Keep appointments for follow-up doses. It is important not to miss your dose. Call your care team if you are unable to keep an appointment. What may interact with this medication? Do not take this medication with any of the following: Live vaccines This medication may also interact with the following: Cisplatin This list may not describe all possible interactions. Give your health care provider a list of all the medicines, herbs, non-prescription drugs, or dietary supplements you use. Also tell them if you smoke, drink alcohol, or use illegal drugs. Some items may interact with your medicine. What should I watch for while using this medication? Your condition will be monitored carefully while you are receiving this medication. You may need blood work while taking this medication. This medication can cause serious infusion reactions. To reduce the risk your care team may give you other medications to take before receiving this one. Be sure to follow the directions from your care team. This medication may increase your risk of getting an infection. Call your care team for advice if you get a fever, chills, sore throat, or other symptoms of a cold or flu. Do not treat yourself. Try to avoid being around people who are sick. Call your care team if you are around anyone with measles, chickenpox, or if you develop sores or blisters that do not heal properly. Avoid taking medications that contain aspirin, acetaminophen, ibuprofen, naproxen, or ketoprofen unless instructed by your care team. These medications may hide a fever. This medication  may cause serious skin reactions. They can happen weeks to months after starting the medication. Contact your care team right away if you notice fevers or flu-like symptoms with a rash. The rash may be red or purple and then turn into blisters or peeling of the skin. You may also notice a red rash with swelling of the face, lips, or lymph nodes in your neck or under your arms. In some patients, this medication may cause a serious brain infection that may cause death. If you have any problems seeing, thinking, speaking, walking, or standing, tell your care team right away. If you cannot reach your care team, urgently seek another source of medical care. Talk to your care team if you may be pregnant. Serious birth defects can occur if you take this medication during pregnancy and for 12 months after the last dose. You will need a negative pregnancy test before starting this medication. Contraception is recommended while taking this medication and for 12 months after the last dose. Your care team can help you find the option that works for you. Do not breastfeed while taking this medication and for at least 6 months after the last dose. What side effects may  I notice from receiving this medication? Side effects that you should report to your care team as soon as possible: Allergic reactions or angioedema--skin rash, itching or hives, swelling of the face, eyes, lips, tongue, arms, or legs, trouble swallowing or breathing Bowel blockage--stomach cramping, unable to have a bowel movement or pass gas, loss of appetite, vomiting Dizziness, loss of balance or coordination, confusion or trouble speaking Heart attack--pain or tightness in the chest, shoulders, arms, or jaw, nausea, shortness of breath, cold or clammy skin, feeling faint or lightheaded Heart rhythm changes--fast or irregular heartbeat, dizziness, feeling faint or lightheaded, chest pain, trouble breathing Infection--fever, chills, cough, sore  throat, wounds that don't heal, pain or trouble when passing urine, general feeling of discomfort or being unwell Infusion reactions--chest pain, shortness of breath or trouble breathing, feeling faint or lightheaded Kidney injury--decrease in the amount of urine, swelling of the ankles, hands, or feet Liver injury--right upper belly pain, loss of appetite, nausea, light-colored stool, dark yellow or brown urine, yellowing skin or eyes, unusual weakness or fatigue Redness, blistering, peeling, or loosening of the skin, including inside the mouth Stomach pain that is severe, does not go away, or gets worse Tumor lysis syndrome (TLS)--nausea, vomiting, diarrhea, decrease in the amount of urine, dark urine, unusual weakness or fatigue, confusion, muscle pain or cramps, fast or irregular heartbeat, joint pain Side effects that usually do not require medical attention (report to your care team if they continue or are bothersome): Headache Joint pain Nausea Runny or stuffy nose Unusual weakness or fatigue This list may not describe all possible side effects. Call your doctor for medical advice about side effects. You may report side effects to FDA at 1-800-FDA-1088. Where should I keep my medication? This medication is given in a hospital or clinic. It will not be stored at home. NOTE: This sheet is a summary. It may not cover all possible information. If you have questions about this medicine, talk to your doctor, pharmacist, or health care provider.  2024 Elsevier/Gold Standard (2021-07-28 00:00:00)

## 2023-09-26 ENCOUNTER — Other Ambulatory Visit: Payer: Self-pay

## 2023-09-26 ENCOUNTER — Inpatient Hospital Stay

## 2023-09-26 VITALS — BP 125/67 | HR 77 | Temp 98.2°F | Resp 18

## 2023-09-26 DIAGNOSIS — C8599 Non-Hodgkin lymphoma, unspecified, extranodal and solid organ sites: Secondary | ICD-10-CM

## 2023-09-26 DIAGNOSIS — Z5112 Encounter for antineoplastic immunotherapy: Secondary | ICD-10-CM | POA: Diagnosis not present

## 2023-09-26 MED ORDER — DEXAMETHASONE SODIUM PHOSPHATE 10 MG/ML IJ SOLN
10.0000 mg | Freq: Once | INTRAMUSCULAR | Status: AC
  Administered 2023-09-26: 10 mg via INTRAVENOUS
  Filled 2023-09-26: qty 1

## 2023-09-26 MED ORDER — SODIUM CHLORIDE 0.9% FLUSH
10.0000 mL | INTRAVENOUS | Status: DC | PRN
  Administered 2023-09-26: 10 mL

## 2023-09-26 MED ORDER — SODIUM CHLORIDE 0.9 % IV SOLN: INTRAVENOUS | Status: DC

## 2023-09-26 MED ORDER — HEPARIN SOD (PORK) LOCK FLUSH 100 UNIT/ML IV SOLN
500.0000 [IU] | Freq: Once | INTRAVENOUS | Status: AC | PRN
  Administered 2023-09-26: 500 [IU]

## 2023-09-26 MED ORDER — SODIUM CHLORIDE 0.9 % IV SOLN
180.0000 mg | Freq: Once | INTRAVENOUS | Status: AC
  Administered 2023-09-26: 180 mg via INTRAVENOUS
  Filled 2023-09-26: qty 7.2

## 2023-09-26 NOTE — Patient Instructions (Signed)
Bendamustine Injection What is this medication? BENDAMUSTINE (BEN da MUS teen) treats leukemia and lymphoma. It works by slowing down the growth of cancer cells. This medicine may be used for other purposes; ask your health care provider or pharmacist if you have questions. COMMON BRAND NAME(S): Marilynne Halsted, VIVIMUSTA What should I tell my care team before I take this medication? They need to know if you have any of these conditions: Infection, especially a viral infection, such as chickenpox, cold sores, herpes Kidney disease Liver disease An unusual or allergic reaction to bendamustine, mannitol, other medications, foods, dyes, or preservatives Pregnant or trying to get pregnant Breast-feeding How should I use this medication? This medication is injected into a vein. It is given by your care team in a hospital or clinic setting. Talk to your care team about the use of this medication in children. Special care may be needed. Overdosage: If you think you have taken too much of this medicine contact a poison control center or emergency room at once. NOTE: This medicine is only for you. Do not share this medicine with others. What if I miss a dose? Keep appointments for follow-up doses. It is important not to miss your dose. Call your care team if you are unable to keep an appointment. What may interact with this medication? Do not take this medication with any of the following: Clozapine This medication may also interact with the following: Atazanavir Cimetidine Ciprofloxacin Enoxacin Fluvoxamine Medications for seizures, such as carbamazepine, phenobarbital Mexiletine Rifampin Tacrine Thiabendazole Zileuton This list may not describe all possible interactions. Give your health care provider a list of all the medicines, herbs, non-prescription drugs, or dietary supplements you use. Also tell them if you smoke, drink alcohol, or use illegal drugs. Some items may  interact with your medicine. What should I watch for while using this medication? Visit your care team for regular checks on your progress. This medication may make you feel generally unwell. This is not uncommon, as chemotherapy can affect healthy cells as well as cancer cells. Report any side effects. Continue your course of treatment even though you feel ill unless your care team tells you to stop. You may need blood work while taking this medication. This medication may increase your risk of getting an infection. Call your care team for advice if you get a fever, chills, sore throat, or other symptoms of a cold or flu. Do not treat yourself. Try to avoid being around people who are sick. This medication may cause serious skin reactions. They can happen weeks to months after starting the medication. Contact your care team right away if you notice fevers or flu-like symptoms with a rash. The rash may be red or purple and then turn into blisters or peeling of the skin. You may also notice a red rash with swelling of the face, lips, or lymph nodes in your neck or under your arms. In some patients, this medication may cause a serious brain infection that may cause death. If you have any problems seeing, thinking, speaking, walking, or standing, tell your care team right away. If you cannot reach your care team, urgently seek other source of medical care. This medication may increase your risk to bruise or bleed. Call your care team if you notice any unusual bleeding. Talk to your care team about your risk of cancer. You may be more at risk for certain types of cancer if you take this medication. Talk to your care team about your  risk of skin cancer. You may be more at risk for skin cancer if you take this medication. Talk to your care team if you or your partner wish to become pregnant or think either of you might be pregnant. This medication can cause serious birth defects if taken during pregnancy or for  up to 6 months after the last dose. A negative pregnancy test is required before starting this medication. A reliable form of contraception is recommended while taking this medication and for 6 months after the last dose. Talk to your care team about reliable forms of contraception. Wear a condom while taking this medication and for at least 3 months after the last dose. Do not breast-feed while taking this medication or for at least 1 week after the last dose. This medication may cause infertility. Talk to your care team if you are concerned about your fertility. What side effects may I notice from receiving this medication? Side effects that you should report to your care team as soon as possible: Allergic reactions--skin rash, itching, hives, swelling of the face, lips, tongue, or throat Infection--fever, chills, cough, sore throat, wounds that don't heal, pain or trouble when passing urine, general feeling of discomfort or being unwell Infusion reactions--chest pain, shortness of breath or trouble breathing, feeling faint or lightheaded Liver injury--right upper belly pain, loss of appetite, nausea, light-colored stool, dark yellow or brown urine, yellowing skin or eyes, unusual weakness or fatigue Low red blood cell level--unusual weakness or fatigue, dizziness, headache, trouble breathing Painful swelling, warmth, or redness of the skin, blisters or sores at the infusion site Rash, fever, and swollen lymph nodes Redness, blistering, peeling, or loosening of the skin, including inside the mouth Tumor lysis syndrome (TLS)--nausea, vomiting, diarrhea, decrease in the amount of urine, dark urine, unusual weakness or fatigue, confusion, muscle pain or cramps, fast or irregular heartbeat, joint pain Unusual bruising or bleeding Side effects that usually do not require medical attention (report to your care team if they continue or are bothersome): Diarrhea Fatigue Headache Loss of  appetite Nausea Vomiting This list may not describe all possible side effects. Call your doctor for medical advice about side effects. You may report side effects to FDA at 1-800-FDA-1088. Where should I keep my medication? This medication is given in a hospital or clinic. It will not be stored at home. NOTE: This sheet is a summary. It may not cover all possible information. If you have questions about this medicine, talk to your doctor, pharmacist, or health care provider.  2024 Elsevier/Gold Standard (2021-06-28 00:00:00)

## 2023-09-27 ENCOUNTER — Encounter: Payer: Self-pay | Admitting: Oncology

## 2023-10-15 ENCOUNTER — Other Ambulatory Visit (HOSPITAL_BASED_OUTPATIENT_CLINIC_OR_DEPARTMENT_OTHER): Payer: Self-pay

## 2023-10-15 ENCOUNTER — Ambulatory Visit: Admitting: Adult Health

## 2023-10-16 ENCOUNTER — Other Ambulatory Visit (HOSPITAL_BASED_OUTPATIENT_CLINIC_OR_DEPARTMENT_OTHER): Payer: Self-pay

## 2023-10-16 ENCOUNTER — Encounter (HOSPITAL_BASED_OUTPATIENT_CLINIC_OR_DEPARTMENT_OTHER): Payer: Self-pay | Admitting: Pharmacy Technician

## 2023-10-22 NOTE — Progress Notes (Unsigned)
 [facility]  [address] [place],  [place]  [address] [phone]  Clinic Day:  10/23/2023  Referring physician: Thurmond Cathlyn LABOR., MD   HISTORY OF PRESENT ILLNESS:  The patient is an 83 y.o. male  with high-grade B cell lymphoma, who comes in today to be evaluated before heading into his 6th and final cycle of  bendamustine /Rituxan .  The patient continues to tolerate this chemotherapy regimen very well.  His family is also pleased with how well he is tolerating it.  They are also pleased as no new skin lesions or lymphadenopathy has developed since his bendamustine /Rituxan  started.  He denies having any new symptoms or findings which concern him for disease progression.    PHYSICAL EXAM:  Blood pressure 95/66, pulse 96, temperature 98 F (36.7 C), temperature source Oral, resp. rate 16, height 6' (1.829 m), SpO2 96%. Wt Readings from Last 3 Encounters:  08/28/23 166 lb (75.3 kg)  07/24/23 166 lb 6.4 oz (75.5 kg)  06/13/23 180 lb 3.2 oz (81.7 kg)   Body mass index is 22.51 kg/m. Performance status (ECOG): 4 - Bedbound Physical Exam Constitutional:      Appearance: Normal appearance. He is not ill-appearing.     Comments: A pleasant older gentleman who is in a wheelchair.    HENT:     Mouth/Throat:     Mouth: Mucous membranes are moist.     Pharynx: Oropharynx is clear. No oropharyngeal exudate or posterior oropharyngeal erythema.  Cardiovascular:     Rate and Rhythm: Normal rate and regular rhythm.     Heart sounds: No murmur heard.    No friction rub. No gallop.  Pulmonary:     Effort: Pulmonary effort is normal. No respiratory distress.     Breath sounds: Normal breath sounds. No wheezing, rhonchi or rales.  Abdominal:     General: Bowel sounds are normal. There is no distension.     Palpations: Abdomen is soft. There is no mass.     Tenderness: There is no abdominal tenderness.  Musculoskeletal:        General: No swelling.     Right  upper arm: No swelling.     Right forearm: No edema.     Right lower leg: No edema.     Left lower leg: No edema.  Lymphadenopathy:     Cervical: No cervical adenopathy.     Upper Body:     Right upper body: No supraclavicular or axillary adenopathy.     Left upper body: No supraclavicular or axillary adenopathy.     Lower Body: No right inguinal adenopathy. No left inguinal adenopathy.  Skin:    General: Skin is warm.     Coloration: Skin is not jaundiced.     Findings: No lesion or rash.     Comments: Very faint remnants of his previously large skin lesions were present  Neurological:     General: No focal deficit present.     Mental Status: He is alert and oriented to person, place, and time. Mental status is at baseline.  Psychiatric:        Mood and Affect: Mood normal.        Behavior: Behavior normal.        Thought Content: Thought content normal.    PATHOLOGY:   LABS:      Latest Ref Rng & Units 10/23/2023    9:22 AM 09/25/2023    9:13 AM 08/28/2023    9:57 AM  CBC  WBC 4.0 - 10.5 K/uL 4.4  13.9  8.3   Hemoglobin 13.0 - 17.0 g/dL 87.0  86.7  86.5   Hematocrit 39.0 - 52.0 % 37.9  39.4  40.8   Platelets 150 - 400 K/uL 156  187  175       Latest Ref Rng & Units 10/23/2023    9:22 AM 09/25/2023    9:13 AM 08/28/2023    9:57 AM  CMP  Glucose 70 - 99 mg/dL 868  848  865   BUN 8 - 23 mg/dL 15  14  22    Creatinine 0.61 - 1.24 mg/dL 9.15  9.04  8.98   Sodium 135 - 145 mmol/L 139  137  141   Potassium 3.5 - 5.1 mmol/L 3.9  3.8  3.7   Chloride 98 - 111 mmol/L 102  99  105   CO2 22 - 32 mmol/L 26  26  25    Calcium  8.9 - 10.3 mg/dL 9.9  9.8  89.9   Total Protein 6.5 - 8.1 g/dL 6.4  6.5  6.1   Total Bilirubin 0.0 - 1.2 mg/dL 0.5  0.8  0.4   Alkaline Phos 38 - 126 U/L 67  74  60   AST 15 - 41 U/L 22  19  19    ALT 0 - 44 U/L 17  10  17     ASSESSMENT & PLAN:  An 83 y.o. male with stage IVA diffuse large B lymphoma, ABC subtype.  He will proceed with his sixth and final  cycle of Bendamustine /Rituxan  today.  Overall, he appears to be doing well.  I will see this patient back in 6 weeks for repeat clinical assessment.  A PET scan will be done a day before that visit to ascertain his new disease baseline after completing all 6 cycles of Bendamustine /Rituxan .  The patient and his family understand all the plans discussed today and are in agreement with them.  Imya Mance DELENA Kerns, MD

## 2023-10-23 ENCOUNTER — Inpatient Hospital Stay: Admitting: Oncology

## 2023-10-23 ENCOUNTER — Inpatient Hospital Stay: Attending: Oncology

## 2023-10-23 ENCOUNTER — Other Ambulatory Visit: Payer: Self-pay | Admitting: Hematology and Oncology

## 2023-10-23 ENCOUNTER — Inpatient Hospital Stay

## 2023-10-23 ENCOUNTER — Other Ambulatory Visit: Payer: Self-pay | Admitting: Oncology

## 2023-10-23 VITALS — BP 147/69 | HR 75 | Temp 98.0°F | Resp 18

## 2023-10-23 VITALS — BP 95/66 | HR 96 | Temp 98.0°F | Resp 16 | Ht 72.0 in

## 2023-10-23 DIAGNOSIS — C8599 Non-Hodgkin lymphoma, unspecified, extranodal and solid organ sites: Secondary | ICD-10-CM

## 2023-10-23 DIAGNOSIS — Z5112 Encounter for antineoplastic immunotherapy: Secondary | ICD-10-CM | POA: Diagnosis present

## 2023-10-23 DIAGNOSIS — Z5111 Encounter for antineoplastic chemotherapy: Secondary | ICD-10-CM | POA: Insufficient documentation

## 2023-10-23 DIAGNOSIS — C8510 Unspecified B-cell lymphoma, unspecified site: Secondary | ICD-10-CM | POA: Insufficient documentation

## 2023-10-23 LAB — CMP (CANCER CENTER ONLY)
ALT: 17 U/L (ref 0–44)
AST: 22 U/L (ref 15–41)
Albumin: 3.9 g/dL (ref 3.5–5.0)
Alkaline Phosphatase: 67 U/L (ref 38–126)
Anion gap: 11 (ref 5–15)
BUN: 15 mg/dL (ref 8–23)
CO2: 26 mmol/L (ref 22–32)
Calcium: 9.9 mg/dL (ref 8.9–10.3)
Chloride: 102 mmol/L (ref 98–111)
Creatinine: 0.84 mg/dL (ref 0.61–1.24)
GFR, Estimated: >60 mL/min (ref >60)
Glucose, Bld: 131 mg/dL — ABNORMAL HIGH (ref 70–99)
Potassium: 3.9 mmol/L (ref 3.5–5.1)
Sodium: 139 mmol/L (ref 135–145)
Total Bilirubin: 0.5 mg/dL (ref 0.0–1.2)
Total Protein: 6.4 g/dL — ABNORMAL LOW (ref 6.5–8.1)

## 2023-10-23 LAB — CBC WITH DIFFERENTIAL (CANCER CENTER ONLY)
Abs Immature Granulocytes: 0.02 K/uL (ref 0.00–0.07)
Basophils Absolute: 0.1 K/uL (ref 0.0–0.1)
Basophils Relative: 1 %
Eosinophils Absolute: 0.6 K/uL — ABNORMAL HIGH (ref 0.0–0.5)
Eosinophils Relative: 15 %
HCT: 37.9 % — ABNORMAL LOW (ref 39.0–52.0)
Hemoglobin: 12.9 g/dL — ABNORMAL LOW (ref 13.0–17.0)
Immature Granulocytes: 1 %
Lymphocytes Relative: 10 %
Lymphs Abs: 0.5 K/uL — ABNORMAL LOW (ref 0.7–4.0)
MCH: 29.5 pg (ref 26.0–34.0)
MCHC: 34 g/dL (ref 30.0–36.0)
MCV: 86.7 fL (ref 80.0–100.0)
Monocytes Absolute: 0.5 K/uL (ref 0.1–1.0)
Monocytes Relative: 12 %
Neutro Abs: 2.7 K/uL (ref 1.7–7.7)
Neutrophils Relative %: 61 %
Platelet Count: 156 K/uL (ref 150–400)
RBC: 4.37 MIL/uL (ref 4.22–5.81)
RDW: 15.5 % (ref 11.5–15.5)
WBC Count: 4.4 K/uL (ref 4.0–10.5)
nRBC: 0 % (ref 0.0–0.2)

## 2023-10-23 MED ORDER — ACETAMINOPHEN 325 MG PO TABS
650.0000 mg | ORAL_TABLET | Freq: Once | ORAL | Status: AC
  Administered 2023-10-23: 650 mg via ORAL
  Filled 2023-10-23: qty 2

## 2023-10-23 MED ORDER — SODIUM CHLORIDE 0.9 % IV SOLN: INTRAVENOUS | Status: DC

## 2023-10-23 MED ORDER — PALONOSETRON HCL INJECTION 0.25 MG/5ML
0.2500 mg | Freq: Once | INTRAVENOUS | Status: AC
  Administered 2023-10-23: 0.25 mg via INTRAVENOUS
  Filled 2023-10-23: qty 5

## 2023-10-23 MED ORDER — DIPHENHYDRAMINE HCL 25 MG PO CAPS
50.0000 mg | ORAL_CAPSULE | Freq: Once | ORAL | Status: AC
  Administered 2023-10-23: 50 mg via ORAL
  Filled 2023-10-23: qty 2

## 2023-10-23 MED ORDER — SODIUM CHLORIDE 0.9 % IV SOLN
375.0000 mg/m2 | Freq: Once | INTRAVENOUS | Status: AC
  Administered 2023-10-23: 800 mg via INTRAVENOUS
  Filled 2023-10-23: qty 50

## 2023-10-23 MED ORDER — DEXAMETHASONE SODIUM PHOSPHATE 10 MG/ML IJ SOLN
10.0000 mg | Freq: Once | INTRAMUSCULAR | Status: AC
  Administered 2023-10-23: 10 mg via INTRAVENOUS
  Filled 2023-10-23: qty 1

## 2023-10-23 MED ORDER — SODIUM CHLORIDE 0.9 % IV SOLN
180.0000 mg | Freq: Once | INTRAVENOUS | Status: AC
  Administered 2023-10-23: 180 mg via INTRAVENOUS
  Filled 2023-10-23: qty 7.2

## 2023-10-23 MED ORDER — SODIUM CHLORIDE 0.9% FLUSH
10.0000 mL | INTRAVENOUS | Status: DC | PRN
Start: 2023-10-23 — End: 2023-10-23

## 2023-10-23 NOTE — Patient Instructions (Signed)
Bendamustine Injection What is this medication? BENDAMUSTINE (BEN da MUS teen) treats leukemia and lymphoma. It works by slowing down the growth of cancer cells. This medicine may be used for other purposes; ask your health care provider or pharmacist if you have questions. COMMON BRAND NAME(S): Oren Beckmann, VIVIMUSTA What should I tell my care team before I take this medication? They need to know if you have any of these conditions: Infection, especially a viral infection, such as chickenpox, cold sores, herpes Kidney disease Liver disease An unusual or allergic reaction to bendamustine, mannitol, other medications, foods, dyes, or preservatives Pregnant or trying to get pregnant Breast-feeding How should I use this medication? This medication is injected into a vein. It is given by your care team in a hospital or clinic setting. Talk to your care team about the use of this medication in children. Special care may be needed. Overdosage: If you think you have taken too much of this medicine contact a poison control center or emergency room at once. NOTE: This medicine is only for you. Do not share this medicine with others. What if I miss a dose? Keep appointments for follow-up doses. It is important not to miss your dose. Call your care team if you are unable to keep an appointment. What may interact with this medication? Do not take this medication with any of the following: Clozapine This medication may also interact with the following: Atazanavir Cimetidine Ciprofloxacin Enoxacin Fluvoxamine Medications for seizures, such as carbamazepine, phenobarbital Mexiletine Rifampin Tacrine Thiabendazole Zileuton This list may not describe all possible interactions. Give your health care provider a list of all the medicines, herbs, non-prescription drugs, or dietary supplements you use. Also tell them if you smoke, drink alcohol, or use illegal drugs. Some items may  interact with your medicine. What should I watch for while using this medication? Visit your care team for regular checks on your progress. This medication may make you feel generally unwell. This is not uncommon, as chemotherapy can affect healthy cells as well as cancer cells. Report any side effects. Continue your course of treatment even though you feel ill unless your care team tells you to stop. You may need blood work while taking this medication. This medication may increase your risk of getting an infection. Call your care team for advice if you get a fever, chills, sore throat, or other symptoms of a cold or flu. Do not treat yourself. Try to avoid being around people who are sick. This medication may cause serious skin reactions. They can happen weeks to months after starting the medication. Contact your care team right away if you notice fevers or flu-like symptoms with a rash. The rash may be red or purple and then turn into blisters or peeling of the skin. You may also notice a red rash with swelling of the face, lips, or lymph nodes in your neck or under your arms. In some patients, this medication may cause a serious brain infection that may cause death. If you have any problems seeing, thinking, speaking, walking, or standing, tell your care team right away. If you cannot reach your care team, urgently seek other source of medical care. This medication may increase your risk to bruise or bleed. Call your care team if you notice any unusual bleeding. Talk to your care team about your risk of cancer. You may be more at risk for certain types of cancer if you take this medication. Talk to your care team about your  risk of skin cancer. You may be more at risk for skin cancer if you take this medication. Talk to your care team if you or your partner wish to become pregnant or think either of you might be pregnant. This medication can cause serious birth defects if taken during pregnancy or for  up to 6 months after the last dose. A negative pregnancy test is required before starting this medication. A reliable form of contraception is recommended while taking this medication and for 6 months after the last dose. Talk to your care team about reliable forms of contraception. Wear a condom while taking this medication and for at least 3 months after the last dose. Do not breast-feed while taking this medication or for at least 1 week after the last dose. This medication may cause infertility. Talk to your care team if you are concerned about your fertility. What side effects may I notice from receiving this medication? Side effects that you should report to your care team as soon as possible: Allergic reactions--skin rash, itching, hives, swelling of the face, lips, tongue, or throat Infection--fever, chills, cough, sore throat, wounds that don't heal, pain or trouble when passing urine, general feeling of discomfort or being unwell Infusion reactions--chest pain, shortness of breath or trouble breathing, feeling faint or lightheaded Liver injury--right upper belly pain, loss of appetite, nausea, light-colored stool, dark yellow or brown urine, yellowing skin or eyes, unusual weakness or fatigue Low red blood cell level--unusual weakness or fatigue, dizziness, headache, trouble breathing Painful swelling, warmth, or redness of the skin, blisters or sores at the infusion site Rash, fever, and swollen lymph nodes Redness, blistering, peeling, or loosening of the skin, including inside the mouth Tumor lysis syndrome (TLS)--nausea, vomiting, diarrhea, decrease in the amount of urine, dark urine, unusual weakness or fatigue, confusion, muscle pain or cramps, fast or irregular heartbeat, joint pain Unusual bruising or bleeding Side effects that usually do not require medical attention (report to your care team if they continue or are bothersome): Diarrhea Fatigue Headache Loss of  appetite Nausea Vomiting This list may not describe all possible side effects. Call your doctor for medical advice about side effects. You may report side effects to FDA at 1-800-FDA-1088. Where should I keep my medication? This medication is given in a hospital or clinic. It will not be stored at home. NOTE: This sheet is a summary. It may not cover all possible information. If you have questions about this medicine, talk to your doctor, pharmacist, or health care provider.  2024 Elsevier/Gold Standard (2021-06-28 00:00:00) Rituximab; Hyaluronidase Injection What is this medication? RITUXIMAB; HYALURONIDASE (ri TUX i mab; hye al ur ON i dase) treats leukemia and lymphoma. Rituximab works by blocking a protein that causes cancer cells to grow and multiply. This helps to slow or stop the spread of cancer cells. Hyaluronidase works by increasing the absorption of the other medications in the body to help them work better. It is a combination medication that contains a monoclonal antibody. This medicine may be used for other purposes; ask your health care provider or pharmacist if you have questions. COMMON BRAND NAME(S): Rituxan Hycela What should I tell my care team before I take this medication? They need to know if you have any of these conditions: Heart disease Immune system problems Infection, such as hepatitis B, chickenpox, cold sores, herpes Irregular heartbeat Kidney disease Lung or breathing disease, such as asthma Recently received or scheduled to receive a vaccine An unusual or allergic reaction  to rituximab, rituximab;hyaluronidase, mouse proteins, other medications, foods, dyes, or preservatives Pregnant or trying to get pregnant Breast-feeding How should I use this medication? This medication is for injection under the skin. It is given by a care team in a hospital or clinic setting. A special MedGuide will be given to you before each treatment. Be sure to read this  information carefully each time. Talk to your care team about the use of this medication in children. Special care may be needed. Overdosage: If you think you have taken too much of this medicine contact a poison control center or emergency room at once. NOTE: This medicine is only for you. Do not share this medicine with others. What if I miss a dose? Keep appointments for follow-up doses. It is important not to miss your dose. Call your care team if you are unable to keep an appointment. What may interact with this medication? Do not take this medication with any of the following: Live virus vaccines This medication may also interact with the following: Cisplatin This list may not describe all possible interactions. Give your health care provider a list of all the medicines, herbs, non-prescription drugs, or dietary supplements you use. Also tell them if you smoke, drink alcohol, or use illegal drugs. Some items may interact with your medicine. What should I watch for while using this medication? Your condition will be monitored carefully while you are receiving this medication. You may need blood work while taking this medication. This medication can cause serious allergic reactions. To reduce the risk, your care team may give you other medications to take before receiving this one. Be sure to follow the directions from your care team. In some patients, this medication may cause a serious brain infection that may cause death. If you have any problems seeing, thinking, speaking, walking, or standing, tell your care team right away. If you cannot reach your care team, urgently seek other source of medical care. This medication may increase your risk of getting an infection. Call your care team for advice if you get a fever, chills, sore throat, or other symptoms of a cold or flu. Do not treat yourself. Try to avoid being around people who are sick. Talk to your care team if you may be pregnant.  Serious birth defects can occur if you take this medication during pregnancy and for 12 months after the last dose. Your will need a negative pregnancy test before starting this medication. Contraception is recommended while taking this medication and for 12 months after the last dose. Your care team can help you find the option that works for you. Do not breastfeed while taking this medication and for at least 6 months after the last dose. What side effects may I notice from receiving this medication? Side effects that you should report to your care team as soon as possible: Allergic reactions or angioedema--skin rash, itching or hives, swelling of the face, eyes, lips, tongue, arms, or legs, trouble swallowing or breathing Bowel blockage--stomach cramping, unable to have a bowel movement or pass gas, loss of appetite, vomiting Dizziness, loss of balance or coordination, confusion or trouble speaking Fever, chills, unusual weakness or fatigue, loss of appetite, nausea, headache, dizziness, feeling faint or lightheaded, shortness of breath, fast or irregular heartbeat, which may be signs of cytokine release syndrome Heart attack--pain or tightness in the chest, shoulders, arms, or jaw, nausea, shortness of breath, cold or clammy skin, feeling faint or lightheaded Heart rhythm changes--fast or irregular heartbeat,  dizziness, feeling faint or lightheaded, chest pain, trouble breathing Infection--fever, chills, cough, sore throat, wounds that don't heal, pain or trouble when passing urine, general feeling of discomfort or being unwell Kidney injury--decrease in the amount of urine, swelling of the ankles, hands, or feet Liver injury--right upper belly pain, loss of appetite, nausea, light-colored stool, dark yellow or brown urine, yellowing skin or eyes, unusual weakness or fatigue Redness, blistering, peeling, or loosening of the skin, including inside the mouth Stomach pain that is severe, does not go  away, or gets worse Tumor lysis syndrome (TLS)--nausea, vomiting, diarrhea, decrease in the amount of urine, dark urine, unusual weakness or fatigue, confusion, muscle pain or cramps, fast or irregular heartbeat, joint pain Side effects that usually do not require medical attention (report these to your care team if they continue or are bothersome): Constipation Fatigue Hair loss Nausea Pain, redness, or irritation at injection site This list may not describe all possible side effects. Call your doctor for medical advice about side effects. You may report side effects to FDA at 1-800-FDA-1088. Where should I keep my medication? This medication is given in a hospital or clinic. It will not be stored at home. NOTE: This sheet is a summary. It may not cover all possible information. If you have questions about this medicine, talk to your doctor, pharmacist, or health care provider.  2024 Elsevier/Gold Standard (2021-07-28 00:00:00)

## 2023-10-24 ENCOUNTER — Inpatient Hospital Stay

## 2023-10-24 ENCOUNTER — Other Ambulatory Visit: Payer: Self-pay

## 2023-10-24 VITALS — BP 142/78 | HR 77 | Temp 98.0°F | Resp 18 | Ht 72.0 in | Wt 160.0 lb

## 2023-10-24 DIAGNOSIS — C8599 Non-Hodgkin lymphoma, unspecified, extranodal and solid organ sites: Secondary | ICD-10-CM

## 2023-10-24 DIAGNOSIS — Z5112 Encounter for antineoplastic immunotherapy: Secondary | ICD-10-CM | POA: Diagnosis not present

## 2023-10-24 MED ORDER — DEXAMETHASONE SODIUM PHOSPHATE 10 MG/ML IJ SOLN
10.0000 mg | Freq: Once | INTRAMUSCULAR | Status: AC
  Administered 2023-10-24: 10 mg via INTRAVENOUS
  Filled 2023-10-24: qty 1

## 2023-10-24 MED ORDER — SODIUM CHLORIDE 0.9 % IV SOLN
180.0000 mg | Freq: Once | INTRAVENOUS | Status: AC
  Administered 2023-10-24: 180 mg via INTRAVENOUS
  Filled 2023-10-24: qty 7.2

## 2023-10-24 MED ORDER — SODIUM CHLORIDE 0.9% FLUSH
10.0000 mL | INTRAVENOUS | Status: DC | PRN
Start: 2023-10-24 — End: 2023-10-24

## 2023-10-24 MED ORDER — SODIUM CHLORIDE 0.9 % IV SOLN: INTRAVENOUS | Status: DC

## 2023-10-24 NOTE — Patient Instructions (Signed)
Bendamustine Injection What is this medication? BENDAMUSTINE (BEN da MUS teen) treats leukemia and lymphoma. It works by slowing down the growth of cancer cells. This medicine may be used for other purposes; ask your health care provider or pharmacist if you have questions. COMMON BRAND NAME(S): Marilynne Halsted, VIVIMUSTA What should I tell my care team before I take this medication? They need to know if you have any of these conditions: Infection, especially a viral infection, such as chickenpox, cold sores, herpes Kidney disease Liver disease An unusual or allergic reaction to bendamustine, mannitol, other medications, foods, dyes, or preservatives Pregnant or trying to get pregnant Breast-feeding How should I use this medication? This medication is injected into a vein. It is given by your care team in a hospital or clinic setting. Talk to your care team about the use of this medication in children. Special care may be needed. Overdosage: If you think you have taken too much of this medicine contact a poison control center or emergency room at once. NOTE: This medicine is only for you. Do not share this medicine with others. What if I miss a dose? Keep appointments for follow-up doses. It is important not to miss your dose. Call your care team if you are unable to keep an appointment. What may interact with this medication? Do not take this medication with any of the following: Clozapine This medication may also interact with the following: Atazanavir Cimetidine Ciprofloxacin Enoxacin Fluvoxamine Medications for seizures, such as carbamazepine, phenobarbital Mexiletine Rifampin Tacrine Thiabendazole Zileuton This list may not describe all possible interactions. Give your health care provider a list of all the medicines, herbs, non-prescription drugs, or dietary supplements you use. Also tell them if you smoke, drink alcohol, or use illegal drugs. Some items may  interact with your medicine. What should I watch for while using this medication? Visit your care team for regular checks on your progress. This medication may make you feel generally unwell. This is not uncommon, as chemotherapy can affect healthy cells as well as cancer cells. Report any side effects. Continue your course of treatment even though you feel ill unless your care team tells you to stop. You may need blood work while taking this medication. This medication may increase your risk of getting an infection. Call your care team for advice if you get a fever, chills, sore throat, or other symptoms of a cold or flu. Do not treat yourself. Try to avoid being around people who are sick. This medication may cause serious skin reactions. They can happen weeks to months after starting the medication. Contact your care team right away if you notice fevers or flu-like symptoms with a rash. The rash may be red or purple and then turn into blisters or peeling of the skin. You may also notice a red rash with swelling of the face, lips, or lymph nodes in your neck or under your arms. In some patients, this medication may cause a serious brain infection that may cause death. If you have any problems seeing, thinking, speaking, walking, or standing, tell your care team right away. If you cannot reach your care team, urgently seek other source of medical care. This medication may increase your risk to bruise or bleed. Call your care team if you notice any unusual bleeding. Talk to your care team about your risk of cancer. You may be more at risk for certain types of cancer if you take this medication. Talk to your care team about your  risk of skin cancer. You may be more at risk for skin cancer if you take this medication. Talk to your care team if you or your partner wish to become pregnant or think either of you might be pregnant. This medication can cause serious birth defects if taken during pregnancy or for  up to 6 months after the last dose. A negative pregnancy test is required before starting this medication. A reliable form of contraception is recommended while taking this medication and for 6 months after the last dose. Talk to your care team about reliable forms of contraception. Wear a condom while taking this medication and for at least 3 months after the last dose. Do not breast-feed while taking this medication or for at least 1 week after the last dose. This medication may cause infertility. Talk to your care team if you are concerned about your fertility. What side effects may I notice from receiving this medication? Side effects that you should report to your care team as soon as possible: Allergic reactions--skin rash, itching, hives, swelling of the face, lips, tongue, or throat Infection--fever, chills, cough, sore throat, wounds that don't heal, pain or trouble when passing urine, general feeling of discomfort or being unwell Infusion reactions--chest pain, shortness of breath or trouble breathing, feeling faint or lightheaded Liver injury--right upper belly pain, loss of appetite, nausea, light-colored stool, dark yellow or brown urine, yellowing skin or eyes, unusual weakness or fatigue Low red blood cell level--unusual weakness or fatigue, dizziness, headache, trouble breathing Painful swelling, warmth, or redness of the skin, blisters or sores at the infusion site Rash, fever, and swollen lymph nodes Redness, blistering, peeling, or loosening of the skin, including inside the mouth Tumor lysis syndrome (TLS)--nausea, vomiting, diarrhea, decrease in the amount of urine, dark urine, unusual weakness or fatigue, confusion, muscle pain or cramps, fast or irregular heartbeat, joint pain Unusual bruising or bleeding Side effects that usually do not require medical attention (report to your care team if they continue or are bothersome): Diarrhea Fatigue Headache Loss of  appetite Nausea Vomiting This list may not describe all possible side effects. Call your doctor for medical advice about side effects. You may report side effects to FDA at 1-800-FDA-1088. Where should I keep my medication? This medication is given in a hospital or clinic. It will not be stored at home. NOTE: This sheet is a summary. It may not cover all possible information. If you have questions about this medicine, talk to your doctor, pharmacist, or health care provider.  2024 Elsevier/Gold Standard (2021-06-28 00:00:00)

## 2023-10-26 ENCOUNTER — Other Ambulatory Visit (HOSPITAL_BASED_OUTPATIENT_CLINIC_OR_DEPARTMENT_OTHER): Payer: Self-pay

## 2023-10-31 ENCOUNTER — Other Ambulatory Visit (HOSPITAL_BASED_OUTPATIENT_CLINIC_OR_DEPARTMENT_OTHER): Payer: Self-pay

## 2023-11-13 ENCOUNTER — Encounter: Payer: Self-pay | Admitting: Oncology

## 2023-12-01 IMAGING — CR DG CHEST 2V
2 series · 2 of 2 positions shown · non-contrast
Comparison: 04/14/2015

CLINICAL DATA: Chest pain, shortness of breath, dyspnea on exertion

EXAM:
CHEST - 2 VIEW

[w chest pa]
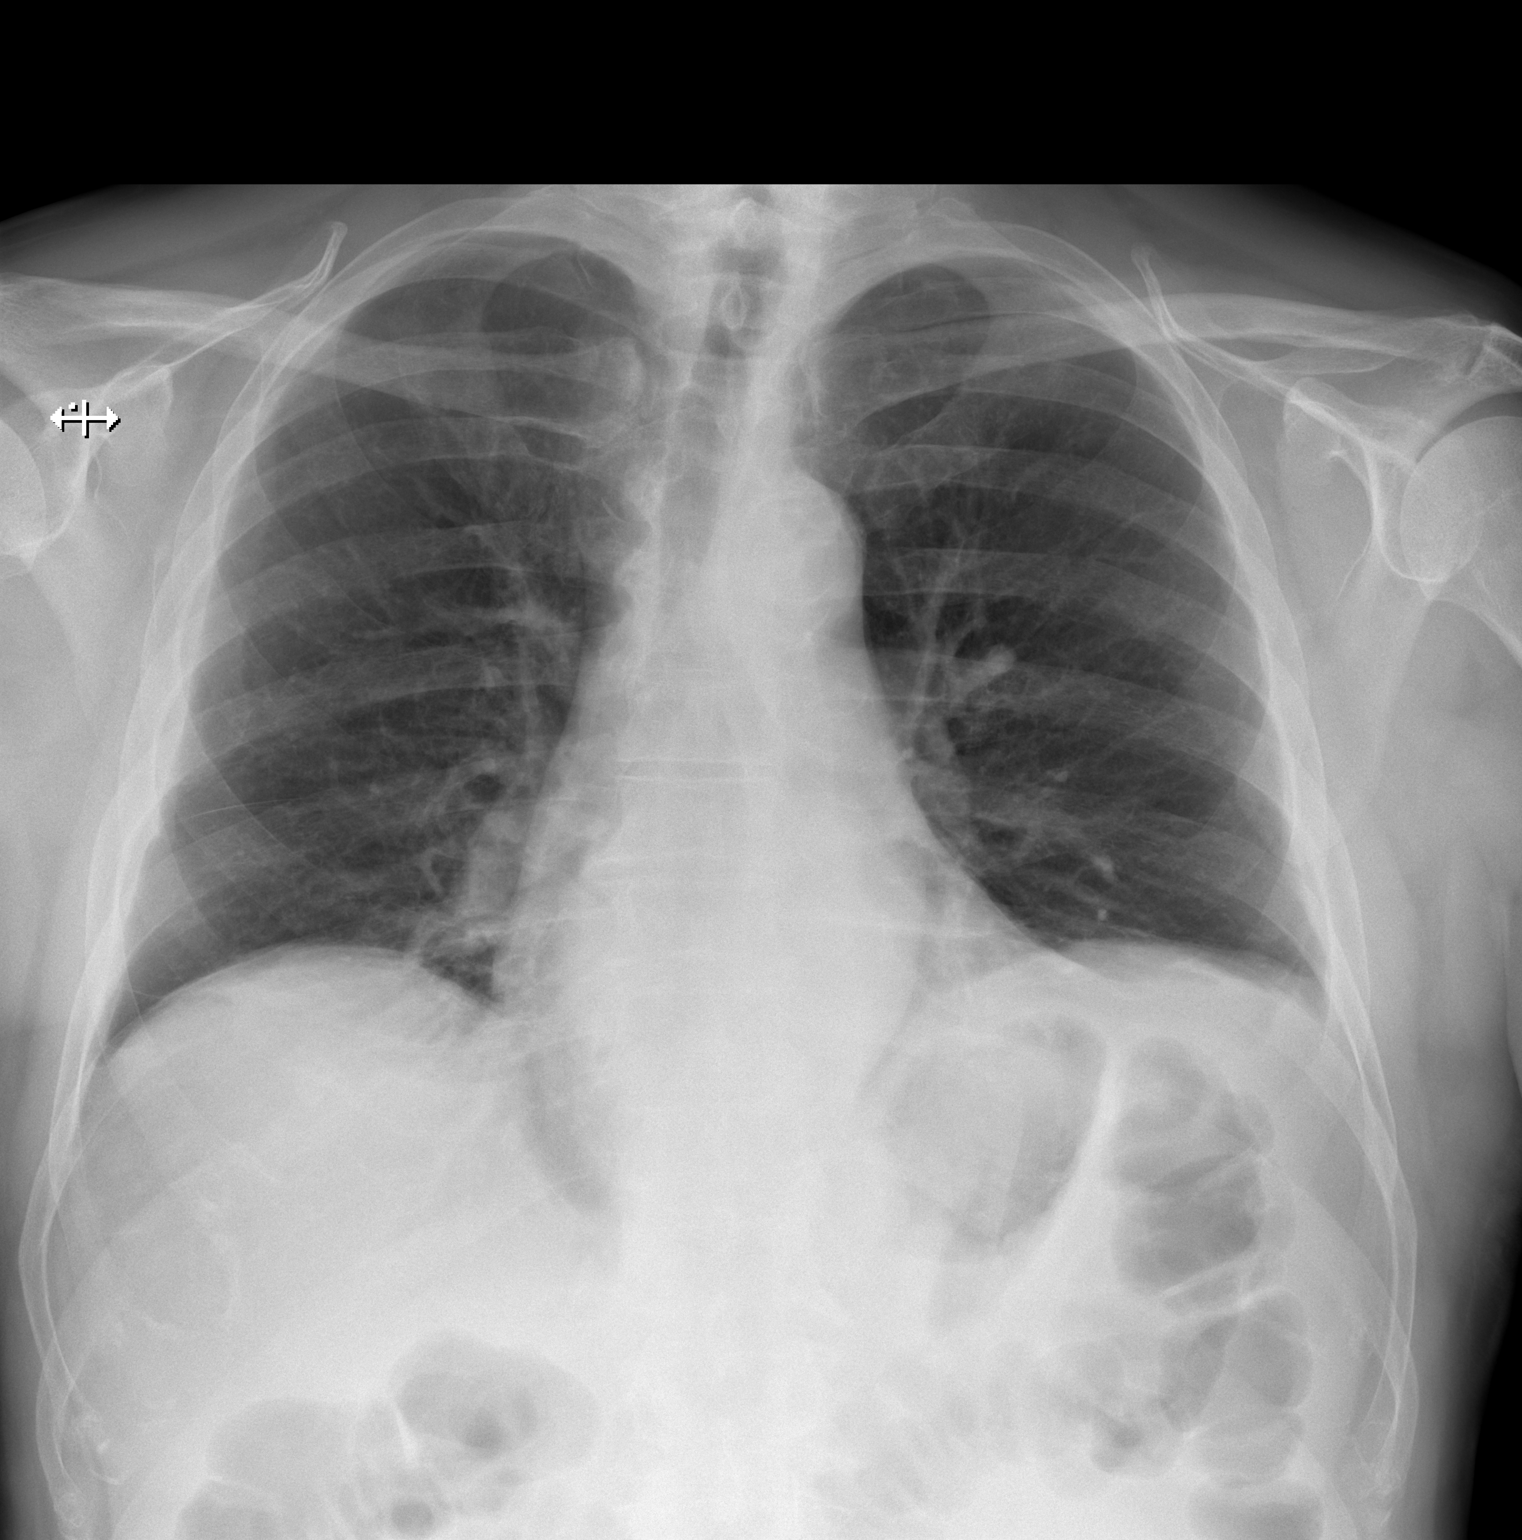

[w chest ap]
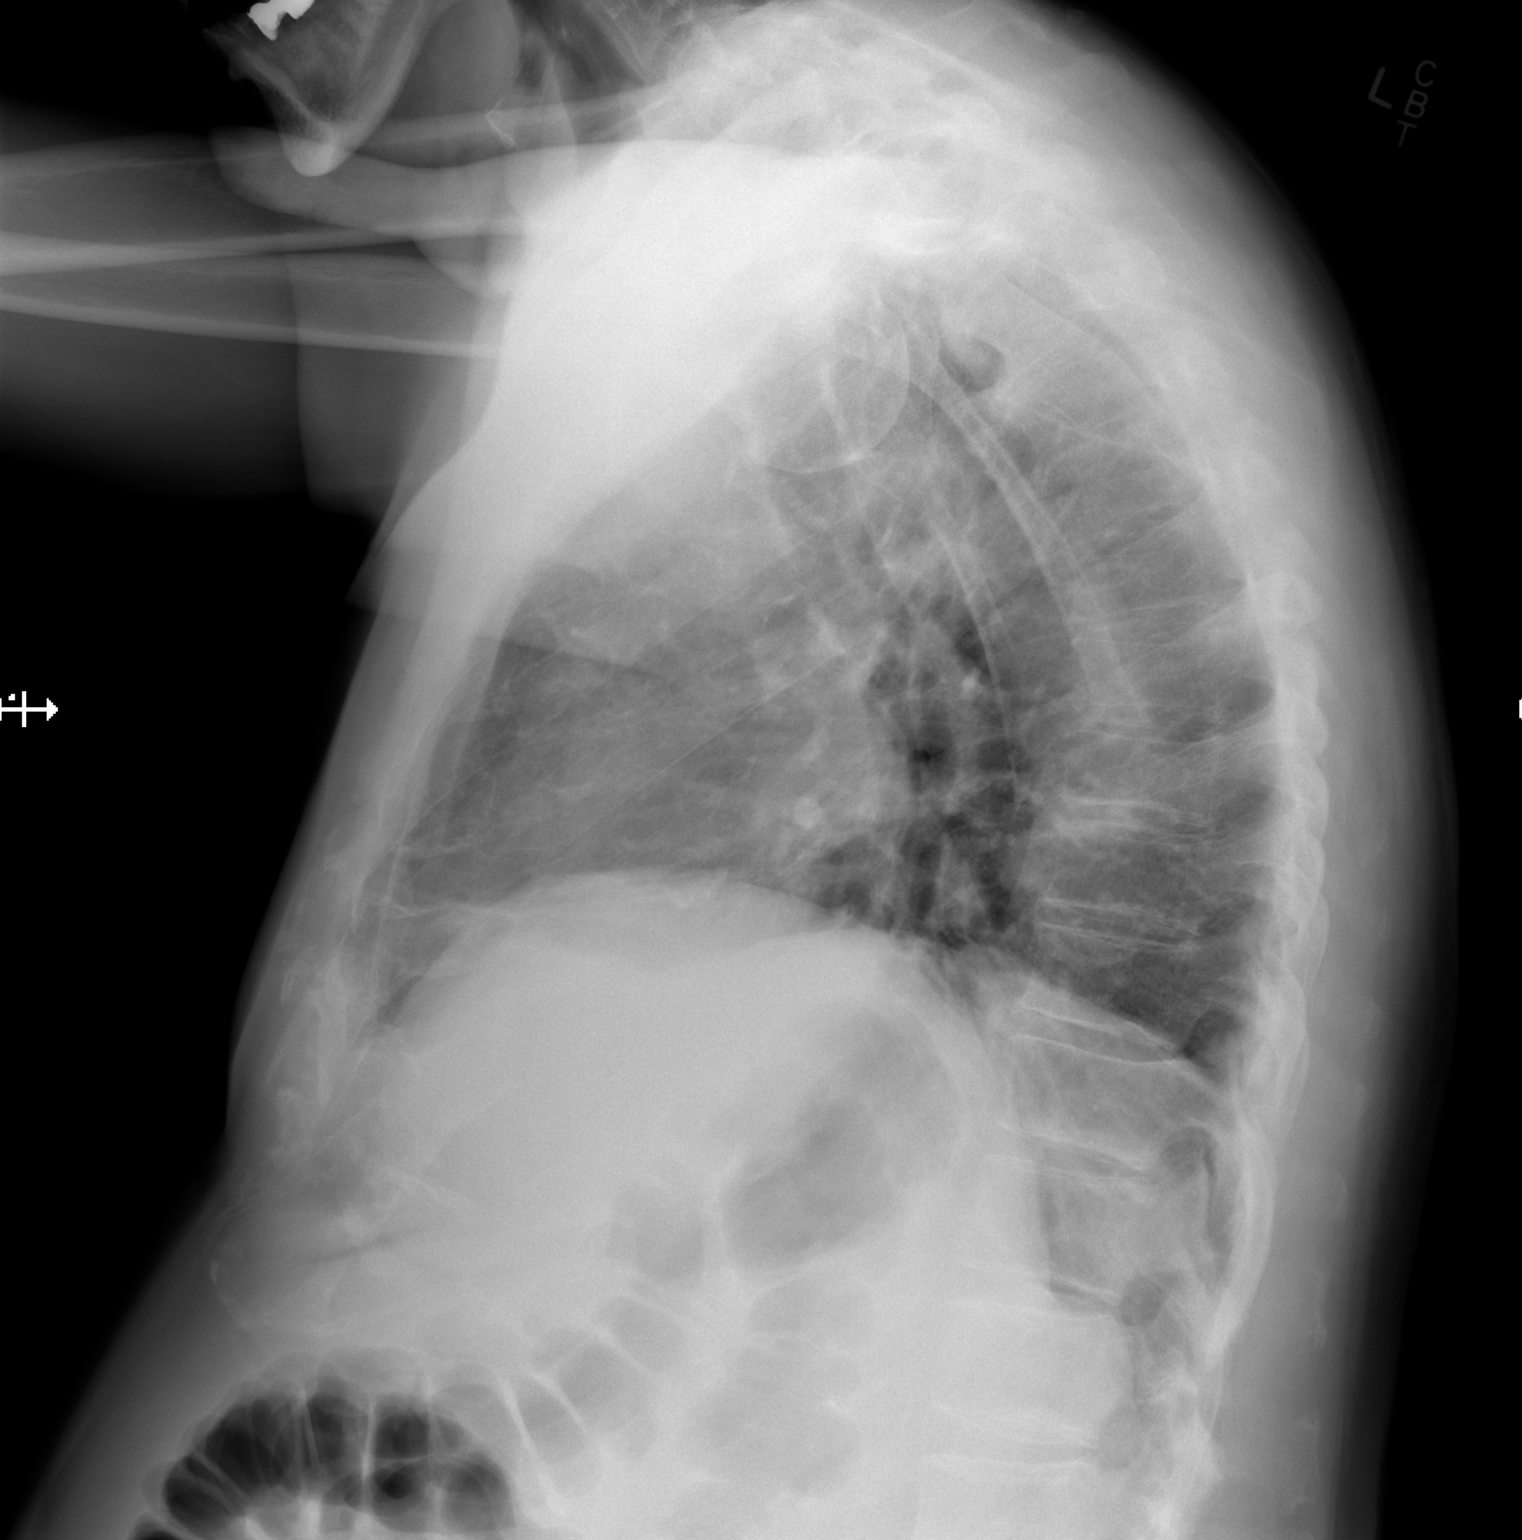

[2 of 2 positions shown; findings below may reference images not displayed]

FINDINGS: Frontal and lateral views of the chest demonstrate an unremarkable
cardiac silhouette. No acute airspace disease, effusion, or
pneumothorax. No acute bony abnormalities.
IMPRESSION: 1. No acute intrathoracic process.

## 2023-12-05 NOTE — Progress Notes (Unsigned)
 [facility]  [address] [place],  [place]  [address] [phone]  Clinic Day:  10/23/2023  Referring physician: Thurmond Cathlyn LABOR., MD   HISTORY OF PRESENT ILLNESS:  The patient is an 83 y.o. male  with high-grade B cell lymphoma, who comes in today to go over his PET scan to ascertain his new disease baseline after completing 6 cycles of  bendamustine /Rituxan  in August 2025.  The patient continues to tolerate this chemotherapy regimen very well.  His family is also pleased with how well he is tolerating it.  They are also pleased as no new skin lesions or lymphadenopathy has developed since his bendamustine /Rituxan  started.  He denies having any new symptoms or findings which concern him for disease progression.    PHYSICAL EXAM:  There were no vitals taken for this visit. Wt Readings from Last 3 Encounters:  10/24/23 160 lb (72.6 kg)  08/28/23 166 lb (75.3 kg)  07/24/23 166 lb 6.4 oz (75.5 kg)   There is no height or weight on file to calculate BMI. Performance status (ECOG): 4 - Bedbound Physical Exam Constitutional:      Appearance: Normal appearance. He is not ill-appearing.     Comments: A pleasant older gentleman who is in a wheelchair.    HENT:     Mouth/Throat:     Mouth: Mucous membranes are moist.     Pharynx: Oropharynx is clear. No oropharyngeal exudate or posterior oropharyngeal erythema.  Cardiovascular:     Rate and Rhythm: Normal rate and regular rhythm.     Heart sounds: No murmur heard.    No friction rub. No gallop.  Pulmonary:     Effort: Pulmonary effort is normal. No respiratory distress.     Breath sounds: Normal breath sounds. No wheezing, rhonchi or rales.  Abdominal:     General: Bowel sounds are normal. There is no distension.     Palpations: Abdomen is soft. There is no mass.     Tenderness: There is no abdominal tenderness.  Musculoskeletal:        General: No swelling.     Right upper arm: No swelling.      Right forearm: No edema.     Right lower leg: No edema.     Left lower leg: No edema.  Lymphadenopathy:     Cervical: No cervical adenopathy.     Upper Body:     Right upper body: No supraclavicular or axillary adenopathy.     Left upper body: No supraclavicular or axillary adenopathy.     Lower Body: No right inguinal adenopathy. No left inguinal adenopathy.  Skin:    General: Skin is warm.     Coloration: Skin is not jaundiced.     Findings: No lesion or rash.     Comments: Very faint remnants of his previously large skin lesions were present  Neurological:     General: No focal deficit present.     Mental Status: He is alert and oriented to person, place, and time. Mental status is at baseline.  Psychiatric:        Mood and Affect: Mood normal.        Behavior: Behavior normal.        Thought Content: Thought content normal.   SCANS:  His PET scan done on 12-05-23 revealed the following:  PATHOLOGY:   LABS:      Latest Ref Rng & Units 10/23/2023    9:22 AM 09/25/2023  9:13 AM 08/28/2023    9:57 AM  CBC  WBC 4.0 - 10.5 K/uL 4.4  13.9  8.3   Hemoglobin 13.0 - 17.0 g/dL 87.0  86.7  86.5   Hematocrit 39.0 - 52.0 % 37.9  39.4  40.8   Platelets 150 - 400 K/uL 156  187  175       Latest Ref Rng & Units 10/23/2023    9:22 AM 09/25/2023    9:13 AM 08/28/2023    9:57 AM  CMP  Glucose 70 - 99 mg/dL 868  848  865   BUN 8 - 23 mg/dL 15  14  22    Creatinine 0.61 - 1.24 mg/dL 9.15  9.04  8.98   Sodium 135 - 145 mmol/L 139  137  141   Potassium 3.5 - 5.1 mmol/L 3.9  3.8  3.7   Chloride 98 - 111 mmol/L 102  99  105   CO2 22 - 32 mmol/L 26  26  25    Calcium  8.9 - 10.3 mg/dL 9.9  9.8  89.9   Total Protein 6.5 - 8.1 g/dL 6.4  6.5  6.1   Total Bilirubin 0.0 - 1.2 mg/dL 0.5  0.8  0.4   Alkaline Phos 38 - 126 U/L 67  74  60   AST 15 - 41 U/L 22  19  19    ALT 0 - 44 U/L 17  10  17     ASSESSMENT & PLAN:  An 83 y.o. male with stage IVA diffuse large B lymphoma, ABC subtype.  He will  proceed with his sixth and final cycle of Bendamustine /Rituxan  today.  Overall, he appears to be doing well.  I will see this patient back in 6 weeks for repeat clinical assessment.  A PET scan will be done a day before that visit to ascertain his new disease baseline after completing all 6 cycles of Bendamustine /Rituxan .  The patient and his family understand all the plans discussed today and are in agreement with them.  Chinonso Linker DELENA Kerns, MD

## 2023-12-06 ENCOUNTER — Inpatient Hospital Stay: Attending: Oncology | Admitting: Oncology

## 2023-12-06 ENCOUNTER — Inpatient Hospital Stay

## 2023-12-06 ENCOUNTER — Telehealth: Payer: Self-pay | Admitting: Oncology

## 2023-12-06 VITALS — BP 118/73 | HR 56 | Temp 98.0°F | Resp 16 | Ht 72.0 in

## 2023-12-06 DIAGNOSIS — C8599 Non-Hodgkin lymphoma, unspecified, extranodal and solid organ sites: Secondary | ICD-10-CM

## 2023-12-06 DIAGNOSIS — Z9221 Personal history of antineoplastic chemotherapy: Secondary | ICD-10-CM | POA: Insufficient documentation

## 2023-12-06 DIAGNOSIS — C8510 Unspecified B-cell lymphoma, unspecified site: Secondary | ICD-10-CM | POA: Diagnosis present

## 2023-12-06 LAB — CMP (CANCER CENTER ONLY)
ALT: 15 U/L (ref 0–44)
AST: 15 U/L (ref 15–41)
Albumin: 3.7 g/dL (ref 3.5–5.0)
Alkaline Phosphatase: 69 U/L (ref 38–126)
Anion gap: 11 (ref 5–15)
BUN: 17 mg/dL (ref 8–23)
CO2: 26 mmol/L (ref 22–32)
Calcium: 9.7 mg/dL (ref 8.9–10.3)
Chloride: 101 mmol/L (ref 98–111)
Creatinine: 0.9 mg/dL (ref 0.61–1.24)
GFR, Estimated: >60 mL/min (ref >60)
Glucose, Bld: 106 mg/dL — ABNORMAL HIGH (ref 70–99)
Potassium: 4.1 mmol/L (ref 3.5–5.1)
Sodium: 138 mmol/L (ref 135–145)
Total Bilirubin: 0.4 mg/dL (ref 0.0–1.2)
Total Protein: 6.3 g/dL — ABNORMAL LOW (ref 6.5–8.1)

## 2023-12-06 LAB — CBC WITH DIFFERENTIAL (CANCER CENTER ONLY)
Abs Immature Granulocytes: 0.04 K/uL (ref 0.00–0.07)
Basophils Absolute: 0.1 K/uL (ref 0.0–0.1)
Basophils Relative: 1 %
Eosinophils Absolute: 0.4 K/uL (ref 0.0–0.5)
Eosinophils Relative: 7 %
HCT: 37.5 % — ABNORMAL LOW (ref 39.0–52.0)
Hemoglobin: 12.3 g/dL — ABNORMAL LOW (ref 13.0–17.0)
Immature Granulocytes: 1 %
Lymphocytes Relative: 6 %
Lymphs Abs: 0.4 K/uL — ABNORMAL LOW (ref 0.7–4.0)
MCH: 28.8 pg (ref 26.0–34.0)
MCHC: 32.8 g/dL (ref 30.0–36.0)
MCV: 87.8 fL (ref 80.0–100.0)
Monocytes Absolute: 0.6 K/uL (ref 0.1–1.0)
Monocytes Relative: 10 %
Neutro Abs: 5 K/uL (ref 1.7–7.7)
Neutrophils Relative %: 75 %
Platelet Count: 235 K/uL (ref 150–400)
RBC: 4.27 MIL/uL (ref 4.22–5.81)
RDW: 15.7 % — ABNORMAL HIGH (ref 11.5–15.5)
WBC Count: 6.5 K/uL (ref 4.0–10.5)
nRBC: 0 % (ref 0.0–0.2)

## 2023-12-06 LAB — LACTATE DEHYDROGENASE: LDH: 139 U/L (ref 98–192)

## 2023-12-06 NOTE — Telephone Encounter (Signed)
 Patient has been scheduled for follow-up visit per 12/05/23 LOS.  Pt given an appt calendar with date and time.

## 2023-12-07 ENCOUNTER — Other Ambulatory Visit: Payer: Self-pay

## 2023-12-21 ENCOUNTER — Encounter: Payer: Self-pay | Admitting: Oncology

## 2024-01-17 ENCOUNTER — Other Ambulatory Visit: Payer: Self-pay

## 2024-01-17 ENCOUNTER — Other Ambulatory Visit (HOSPITAL_BASED_OUTPATIENT_CLINIC_OR_DEPARTMENT_OTHER): Payer: Self-pay

## 2024-01-18 ENCOUNTER — Other Ambulatory Visit (HOSPITAL_BASED_OUTPATIENT_CLINIC_OR_DEPARTMENT_OTHER): Payer: Self-pay

## 2024-01-28 ENCOUNTER — Other Ambulatory Visit (HOSPITAL_BASED_OUTPATIENT_CLINIC_OR_DEPARTMENT_OTHER): Payer: Self-pay

## 2024-01-29 ENCOUNTER — Other Ambulatory Visit (HOSPITAL_BASED_OUTPATIENT_CLINIC_OR_DEPARTMENT_OTHER): Payer: Self-pay

## 2024-02-11 ENCOUNTER — Other Ambulatory Visit (HOSPITAL_BASED_OUTPATIENT_CLINIC_OR_DEPARTMENT_OTHER): Payer: Self-pay

## 2024-02-12 ENCOUNTER — Other Ambulatory Visit (HOSPITAL_BASED_OUTPATIENT_CLINIC_OR_DEPARTMENT_OTHER): Payer: Self-pay

## 2024-02-22 ENCOUNTER — Other Ambulatory Visit (HOSPITAL_BASED_OUTPATIENT_CLINIC_OR_DEPARTMENT_OTHER): Payer: Self-pay

## 2024-02-26 ENCOUNTER — Other Ambulatory Visit (HOSPITAL_BASED_OUTPATIENT_CLINIC_OR_DEPARTMENT_OTHER): Payer: Self-pay

## 2024-03-28 ENCOUNTER — Other Ambulatory Visit (HOSPITAL_BASED_OUTPATIENT_CLINIC_OR_DEPARTMENT_OTHER): Payer: Self-pay

## 2024-04-07 ENCOUNTER — Ambulatory Visit: Admitting: Oncology

## 2024-04-07 ENCOUNTER — Other Ambulatory Visit

## 2024-04-20 DEATH — deceased
